# Patient Record
Sex: Female | Born: 1935
Health system: Southern US, Community
[De-identification: ages and names within clinical notes are randomized; demographics above are authoritative.]

## PROBLEM LIST (undated history)

## (undated) DIAGNOSIS — I10 Essential (primary) hypertension: Secondary | ICD-10-CM

## (undated) DIAGNOSIS — E785 Hyperlipidemia, unspecified: Secondary | ICD-10-CM

## (undated) DIAGNOSIS — Z8673 Personal history of transient ischemic attack (TIA), and cerebral infarction without residual deficits: Secondary | ICD-10-CM

## (undated) DIAGNOSIS — J449 Chronic obstructive pulmonary disease, unspecified: Secondary | ICD-10-CM

## (undated) DIAGNOSIS — I4891 Unspecified atrial fibrillation: Secondary | ICD-10-CM

## (undated) DIAGNOSIS — Z9889 Other specified postprocedural states: Secondary | ICD-10-CM

## (undated) HISTORY — DX: Hyperlipidemia, unspecified: E78.5

## (undated) HISTORY — DX: Essential (primary) hypertension: I10

## (undated) HISTORY — DX: Unspecified atrial fibrillation: I48.91

## (undated) HISTORY — DX: Personal history of transient ischemic attack (TIA), and cerebral infarction without residual deficits: Z86.73

## (undated) HISTORY — DX: Other specified postprocedural states: Z98.890

## (undated) HISTORY — DX: Chronic obstructive pulmonary disease, unspecified: J44.9

## (undated) HISTORY — PX: FRACTURE SURGERY: SHX138

## (undated) HISTORY — PX: OTHER SURGICAL HISTORY: SHX169

---

## 1973-06-23 HISTORY — PX: ABDOMINAL HYSTERECTOMY: SHX81

## 2002-05-18 ENCOUNTER — Encounter: Payer: Self-pay | Admitting: Vascular Surgery

## 2002-05-23 ENCOUNTER — Ambulatory Visit (HOSPITAL_COMMUNITY): Admission: RE | Admit: 2002-05-23 | Discharge: 2002-05-23 | Payer: Self-pay | Admitting: Vascular Surgery

## 2002-05-23 HISTORY — PX: OTHER SURGICAL HISTORY: SHX169

## 2004-06-12 ENCOUNTER — Ambulatory Visit: Payer: Self-pay | Admitting: Family Medicine

## 2004-09-18 ENCOUNTER — Ambulatory Visit: Payer: Self-pay | Admitting: Family Medicine

## 2004-11-06 ENCOUNTER — Ambulatory Visit: Payer: Self-pay | Admitting: Family Medicine

## 2005-01-15 ENCOUNTER — Ambulatory Visit: Payer: Self-pay | Admitting: Family Medicine

## 2005-01-21 ENCOUNTER — Ambulatory Visit: Payer: Self-pay | Admitting: Cardiology

## 2005-03-10 ENCOUNTER — Ambulatory Visit: Payer: Self-pay | Admitting: Family Medicine

## 2005-04-21 ENCOUNTER — Ambulatory Visit: Payer: Self-pay | Admitting: Family Medicine

## 2005-06-13 ENCOUNTER — Ambulatory Visit: Payer: Self-pay | Admitting: Cardiology

## 2005-06-18 ENCOUNTER — Encounter: Payer: Self-pay | Admitting: Cardiology

## 2005-06-18 ENCOUNTER — Ambulatory Visit: Payer: Self-pay | Admitting: Cardiology

## 2005-07-10 ENCOUNTER — Ambulatory Visit: Payer: Self-pay | Admitting: Cardiology

## 2005-07-18 ENCOUNTER — Ambulatory Visit: Payer: Self-pay | Admitting: Cardiology

## 2005-07-22 ENCOUNTER — Ambulatory Visit: Payer: Self-pay | Admitting: Family Medicine

## 2005-10-27 ENCOUNTER — Ambulatory Visit: Payer: Self-pay | Admitting: Family Medicine

## 2005-11-11 ENCOUNTER — Encounter: Payer: Self-pay | Admitting: Cardiology

## 2005-11-25 ENCOUNTER — Ambulatory Visit: Payer: Self-pay | Admitting: Cardiology

## 2005-12-02 ENCOUNTER — Inpatient Hospital Stay (HOSPITAL_COMMUNITY): Admission: EM | Admit: 2005-12-02 | Discharge: 2005-12-04 | Payer: Self-pay | Admitting: *Deleted

## 2005-12-03 ENCOUNTER — Encounter (INDEPENDENT_AMBULATORY_CARE_PROVIDER_SITE_OTHER): Payer: Self-pay | Admitting: Neurology

## 2005-12-03 ENCOUNTER — Encounter: Payer: Self-pay | Admitting: Cardiology

## 2005-12-03 ENCOUNTER — Ambulatory Visit: Payer: Self-pay | Admitting: Cardiology

## 2005-12-04 ENCOUNTER — Encounter: Payer: Self-pay | Admitting: Cardiology

## 2006-01-27 ENCOUNTER — Ambulatory Visit: Payer: Self-pay | Admitting: Family Medicine

## 2006-04-24 ENCOUNTER — Ambulatory Visit: Payer: Self-pay | Admitting: Family Medicine

## 2006-06-04 ENCOUNTER — Ambulatory Visit: Payer: Self-pay | Admitting: Family Medicine

## 2006-06-08 ENCOUNTER — Ambulatory Visit: Payer: Self-pay | Admitting: Cardiology

## 2006-06-13 ENCOUNTER — Ambulatory Visit: Payer: Self-pay | Admitting: Cardiology

## 2006-06-30 ENCOUNTER — Ambulatory Visit: Payer: Self-pay | Admitting: Cardiology

## 2006-07-07 ENCOUNTER — Ambulatory Visit: Payer: Self-pay | Admitting: Cardiology

## 2006-07-14 ENCOUNTER — Ambulatory Visit: Payer: Self-pay | Admitting: Cardiology

## 2006-07-21 ENCOUNTER — Ambulatory Visit: Payer: Self-pay | Admitting: Cardiology

## 2006-08-04 ENCOUNTER — Ambulatory Visit: Payer: Self-pay | Admitting: Cardiology

## 2006-08-11 ENCOUNTER — Ambulatory Visit: Payer: Self-pay | Admitting: Cardiology

## 2006-08-18 ENCOUNTER — Ambulatory Visit: Payer: Self-pay | Admitting: Cardiology

## 2006-09-01 ENCOUNTER — Ambulatory Visit: Payer: Self-pay | Admitting: Cardiology

## 2006-09-10 ENCOUNTER — Ambulatory Visit: Payer: Self-pay | Admitting: Family Medicine

## 2006-09-17 ENCOUNTER — Ambulatory Visit: Payer: Self-pay | Admitting: Cardiology

## 2006-10-19 ENCOUNTER — Ambulatory Visit: Payer: Self-pay | Admitting: Cardiology

## 2006-11-17 ENCOUNTER — Ambulatory Visit: Payer: Self-pay | Admitting: Cardiology

## 2006-11-18 ENCOUNTER — Ambulatory Visit: Payer: Self-pay | Admitting: Family Medicine

## 2006-11-25 ENCOUNTER — Ambulatory Visit: Payer: Self-pay | Admitting: Family Medicine

## 2006-12-08 ENCOUNTER — Ambulatory Visit: Payer: Self-pay | Admitting: Cardiology

## 2006-12-22 ENCOUNTER — Ambulatory Visit: Payer: Self-pay | Admitting: Cardiology

## 2007-01-05 ENCOUNTER — Ambulatory Visit: Payer: Self-pay | Admitting: Cardiology

## 2007-01-19 ENCOUNTER — Ambulatory Visit: Payer: Self-pay | Admitting: Cardiology

## 2007-02-01 ENCOUNTER — Inpatient Hospital Stay (HOSPITAL_COMMUNITY): Admission: EM | Admit: 2007-02-01 | Discharge: 2007-02-10 | Payer: Self-pay | Admitting: Emergency Medicine

## 2007-02-08 ENCOUNTER — Ambulatory Visit: Payer: Self-pay | Admitting: Physical Medicine & Rehabilitation

## 2007-04-08 ENCOUNTER — Ambulatory Visit: Payer: Self-pay | Admitting: Cardiology

## 2007-04-30 ENCOUNTER — Ambulatory Visit: Payer: Self-pay | Admitting: Cardiology

## 2007-05-03 ENCOUNTER — Ambulatory Visit: Payer: Self-pay | Admitting: Cardiology

## 2007-05-17 ENCOUNTER — Ambulatory Visit: Payer: Self-pay | Admitting: Cardiology

## 2007-06-14 ENCOUNTER — Ambulatory Visit: Payer: Self-pay | Admitting: Cardiology

## 2007-07-12 ENCOUNTER — Ambulatory Visit: Payer: Self-pay | Admitting: Cardiology

## 2007-07-26 ENCOUNTER — Ambulatory Visit: Payer: Self-pay | Admitting: Cardiology

## 2007-09-02 ENCOUNTER — Ambulatory Visit: Payer: Self-pay | Admitting: Cardiology

## 2007-09-23 ENCOUNTER — Ambulatory Visit: Payer: Self-pay | Admitting: Cardiology

## 2007-10-21 ENCOUNTER — Ambulatory Visit: Payer: Self-pay | Admitting: Cardiology

## 2007-11-18 ENCOUNTER — Ambulatory Visit: Payer: Self-pay | Admitting: Cardiology

## 2007-12-16 ENCOUNTER — Ambulatory Visit: Payer: Self-pay | Admitting: Cardiology

## 2008-01-13 ENCOUNTER — Ambulatory Visit: Payer: Self-pay | Admitting: Cardiology

## 2008-02-15 ENCOUNTER — Ambulatory Visit: Payer: Self-pay | Admitting: Cardiology

## 2008-03-15 ENCOUNTER — Ambulatory Visit: Payer: Self-pay | Admitting: Cardiology

## 2008-03-18 ENCOUNTER — Encounter: Payer: Self-pay | Admitting: Cardiology

## 2008-04-11 ENCOUNTER — Ambulatory Visit: Payer: Self-pay | Admitting: Cardiology

## 2008-04-18 ENCOUNTER — Ambulatory Visit: Payer: Self-pay | Admitting: Cardiology

## 2008-05-01 ENCOUNTER — Ambulatory Visit: Payer: Self-pay | Admitting: Cardiology

## 2008-05-26 ENCOUNTER — Ambulatory Visit: Payer: Self-pay | Admitting: Cardiology

## 2008-06-20 ENCOUNTER — Ambulatory Visit: Payer: Self-pay | Admitting: Cardiology

## 2008-08-01 ENCOUNTER — Ambulatory Visit: Payer: Self-pay | Admitting: Cardiology

## 2008-09-01 ENCOUNTER — Ambulatory Visit: Payer: Self-pay | Admitting: Cardiology

## 2008-09-29 ENCOUNTER — Ambulatory Visit: Payer: Self-pay | Admitting: Cardiology

## 2008-10-24 ENCOUNTER — Ambulatory Visit: Payer: Self-pay | Admitting: Cardiology

## 2008-11-21 ENCOUNTER — Ambulatory Visit: Payer: Self-pay | Admitting: Cardiology

## 2008-12-19 ENCOUNTER — Ambulatory Visit: Payer: Self-pay | Admitting: Cardiology

## 2009-01-16 ENCOUNTER — Ambulatory Visit: Payer: Self-pay | Admitting: Cardiology

## 2009-02-05 ENCOUNTER — Encounter: Payer: Self-pay | Admitting: *Deleted

## 2009-02-13 ENCOUNTER — Ambulatory Visit: Payer: Self-pay | Admitting: Cardiology

## 2009-03-02 ENCOUNTER — Ambulatory Visit: Payer: Self-pay | Admitting: Cardiology

## 2009-03-19 ENCOUNTER — Encounter: Payer: Self-pay | Admitting: Cardiology

## 2009-03-23 ENCOUNTER — Ambulatory Visit: Payer: Self-pay | Admitting: Cardiology

## 2009-03-23 LAB — CONVERTED CEMR LAB: POC INR: 3.2

## 2009-04-17 ENCOUNTER — Ambulatory Visit: Payer: Self-pay | Admitting: Cardiology

## 2009-04-17 LAB — CONVERTED CEMR LAB: POC INR: 2.4

## 2009-05-15 ENCOUNTER — Ambulatory Visit: Payer: Self-pay | Admitting: Cardiology

## 2009-05-15 LAB — CONVERTED CEMR LAB: POC INR: 2.1

## 2009-05-24 ENCOUNTER — Ambulatory Visit: Payer: Self-pay | Admitting: Cardiology

## 2009-06-12 ENCOUNTER — Ambulatory Visit: Payer: Self-pay | Admitting: Cardiology

## 2009-06-12 LAB — CONVERTED CEMR LAB: POC INR: 2.7

## 2009-07-10 ENCOUNTER — Ambulatory Visit: Payer: Self-pay | Admitting: Cardiology

## 2009-07-10 LAB — CONVERTED CEMR LAB: POC INR: 2.5

## 2009-08-07 ENCOUNTER — Ambulatory Visit: Payer: Self-pay | Admitting: Cardiology

## 2009-08-24 ENCOUNTER — Telehealth (INDEPENDENT_AMBULATORY_CARE_PROVIDER_SITE_OTHER): Payer: Self-pay | Admitting: *Deleted

## 2009-09-04 ENCOUNTER — Ambulatory Visit: Payer: Self-pay | Admitting: Cardiology

## 2009-10-02 ENCOUNTER — Ambulatory Visit: Payer: Self-pay | Admitting: Cardiology

## 2009-10-30 ENCOUNTER — Ambulatory Visit: Payer: Self-pay | Admitting: Cardiology

## 2009-11-27 ENCOUNTER — Ambulatory Visit: Payer: Self-pay | Admitting: Cardiology

## 2009-12-25 ENCOUNTER — Ambulatory Visit: Payer: Self-pay | Admitting: Cardiology

## 2010-01-22 ENCOUNTER — Ambulatory Visit: Payer: Self-pay | Admitting: Cardiology

## 2010-01-22 LAB — CONVERTED CEMR LAB: POC INR: 1.9

## 2010-02-19 ENCOUNTER — Ambulatory Visit: Payer: Self-pay | Admitting: Cardiology

## 2010-02-19 LAB — CONVERTED CEMR LAB: POC INR: 1.9

## 2010-03-12 ENCOUNTER — Ambulatory Visit: Payer: Self-pay | Admitting: Cardiology

## 2010-03-12 LAB — CONVERTED CEMR LAB: POC INR: 2.4

## 2010-04-09 ENCOUNTER — Ambulatory Visit: Payer: Self-pay | Admitting: Cardiology

## 2010-04-09 LAB — CONVERTED CEMR LAB: POC INR: 2.2

## 2010-05-07 ENCOUNTER — Ambulatory Visit: Payer: Self-pay | Admitting: Cardiology

## 2010-05-07 LAB — CONVERTED CEMR LAB: POC INR: 2.2

## 2010-05-27 ENCOUNTER — Ambulatory Visit: Payer: Self-pay | Admitting: Cardiology

## 2010-05-30 ENCOUNTER — Encounter: Payer: Self-pay | Admitting: Cardiology

## 2010-06-04 ENCOUNTER — Ambulatory Visit: Payer: Self-pay | Admitting: Cardiology

## 2010-06-07 ENCOUNTER — Encounter (INDEPENDENT_AMBULATORY_CARE_PROVIDER_SITE_OTHER): Payer: Self-pay | Admitting: *Deleted

## 2010-07-02 ENCOUNTER — Ambulatory Visit: Admission: RE | Admit: 2010-07-02 | Discharge: 2010-07-02 | Payer: Self-pay | Source: Home / Self Care

## 2010-07-23 NOTE — Medication Information (Signed)
Summary: ccr-lr  Anticoagulant Therapy  Managed by: Vashti Hey, RN Referring MD: Jerral Bonito PCP: Massie Maroon MD: Andee Lineman MD, Michelle Piper Indication 1: Atrial Fibrillation (ICD-427.31) [facility] [place] Site: [place] INR POC 2.0  Dietary changes: no    Health status changes: no    Bleeding/hemorrhagic complications: no    Recent/future hospitalizations: no    Any changes in medication regimen? no    Recent/future dental: no  Any missed doses?: yes     Details: missed 1/2 dose 1 week ago  Is patient compliant with meds? yes       Allergies: 1)  ! Codeine  Anticoagulation Management History:      The patient is taking warfarin and comes in today for a routine follow up visit.  Positive risk factors for bleeding include an age of 9 years or older and history of CVA/TIA.  The bleeding index is 'intermediate risk'.  Positive CHADS2 values include History of HTN and Prior Stroke/CVA/TIA.  Negative CHADS2 values include Age > 32 years old.  The start date was 06/23/2006.  Anticoagulation responsible provider: Andee Lineman MD, Michelle Piper.  INR POC: 2.0.  Cuvette Lot#: [id number].  Exp: 10/11.    Anticoagulation Management Assessment/Plan:      [name] current anticoagulation dose is Warfarin sodium 5 mg tabs: Take 1 tablet by mouth once a day, Warfarin sodium 5 mg tabs: take as directed per coumadin clinic.  The target INR is 2 - 3.  The next INR is due 11/27/2009.  Anticoagulation instructions were given to patient.  Results were reviewed/authorized by Vashti Hey, RN.  She was notified by Vashti Hey RN.         Prior Anticoagulation Instructions: INR 2.2 Continue coumadin 2.5mg  once daily except 5mg  on Sundays and Thursdays  Current Anticoagulation Instructions: INR 2.0 Take coumadin 5mg  tonight then resume 2.5mg  once daily except 5mg  on Sundays and Thursdays

## 2010-07-23 NOTE — Medication Information (Signed)
Summary: ccr-lr  Anticoagulant Therapy  Managed by: Vashti Hey, RN Referring MD: Jerral Bonito PCP: Massie Maroon MD: Andee Lineman MD, Michelle Piper Indication 1: Atrial Fibrillation (ICD-427.31) [facility] [place] Site: [place] INR POC 2.2  Dietary changes: no    Health status changes: no    Bleeding/hemorrhagic complications: no    Recent/future hospitalizations: no    Any changes in medication regimen? no    Recent/future dental: no  Any missed doses?: no       Is patient compliant with meds? yes       Allergies: 1)  ! Codeine  Anticoagulation Management History:      The patient is taking warfarin and comes in today for a routine follow up visit.  Positive risk factors for bleeding include an age of 73 years or older and history of CVA/TIA.  The bleeding index is 'intermediate risk'.  Positive CHADS2 values include History of HTN and Prior Stroke/CVA/TIA.  Negative CHADS2 values include Age > 35 years old.  The start date was 06/23/2006.  Anticoagulation responsible provider: Andee Lineman MD, Michelle Piper.  INR POC: 2.2.  Cuvette Lot#: [id number].  Exp: 10/11.    Anticoagulation Management Assessment/Plan:      [name] current anticoagulation dose is Warfarin sodium 5 mg tabs: Take 1 tablet by mouth once a day, Warfarin sodium 5 mg tabs: take as directed per coumadin clinic.  The target INR is 2 - 3.  The next INR is due 10/30/2009.  Anticoagulation instructions were given to patient.  Results were reviewed/authorized by Vashti Hey, RN.  She was notified by Vashti Hey RN.         Prior Anticoagulation Instructions: INR 2.3 Continue coumadin 2.5mg  once daily except 5mg  on Sundays and Thursdays  Current Anticoagulation Instructions: INR 2.2 Continue coumadin 2.5mg  once daily except 5mg  on Sundays and Thursdays

## 2010-07-23 NOTE — Medication Information (Signed)
Summary: ccr-lr  Anticoagulant Therapy  Managed by: Vashti Hey, RN Referring MD: Jerral Bonito PCP: Massie Maroon MD: Andee Lineman MD, Michelle Piper Indication 1: Atrial Fibrillation (ICD-427.31) [facility] [place] Site: [place] INR POC 2.2  Dietary changes: no    Health status changes: no    Bleeding/hemorrhagic complications: no    Recent/future hospitalizations: no    Any changes in medication regimen? no    Recent/future dental: no  Any missed doses?: no       Is patient compliant with meds? yes       Allergies: 1)  ! Codeine  Anticoagulation Management History:      The patient is taking warfarin and comes in today for a routine follow up visit.  Positive risk factors for bleeding include an age of 39 years or older and history of CVA/TIA.  The bleeding index is 'intermediate risk'.  Positive CHADS2 values include History of HTN and Prior Stroke/CVA/TIA.  Negative CHADS2 values include Age > 62 years old.  The start date was 06/23/2006.  Anticoagulation responsible provider: Andee Lineman MD, Michelle Piper.  INR POC: 2.2.  Cuvette Lot#: [id number].  Exp: 10/11.    Anticoagulation Management Assessment/Plan:      [name] current anticoagulation dose is Warfarin sodium 5 mg tabs: Take 1 tablet by mouth once a day, Warfarin sodium 5 mg tabs: take as directed per coumadin clinic.  The target INR is 2 - 3.  The next INR is due 05/07/2010.  Anticoagulation instructions were given to patient.  Results were reviewed/authorized by Vashti Hey, RN.  She was notified by Vashti Hey RN.         Prior Anticoagulation Instructions: INR 2.4 Continue coumadin 2.5mg  once daily except 5mg  on S,T,Th  Current Anticoagulation Instructions: INR 2.2 Continue coumadin 2.5mg  once daily except 5mg  on S,T,Th

## 2010-07-23 NOTE — Progress Notes (Signed)
Summary: dental extraction  Phone Note Call from Patient   Caller: Patient Reason for Call: Talk to Nurse Summary of Call: Pt states dentist needs to pull 1 tooth on 08/27/09.  He told her stop coumadin 3 days before.  She will resume coumadin after procedure on 08/27/09 .  She has F/U INR check on 09/04/09. Initial call taken by: Vashti Hey RN,  August 24, 2009 4:27 PM

## 2010-07-23 NOTE — Medication Information (Signed)
Summary: ccr-lr  Anticoagulant Therapy  Managed by: Vashti Hey, RN Referring MD: Jerral Bonito PCP: Massie Maroon MD: Antoine Poche MD, Fayrene Fearing Indication 1: Atrial Fibrillation (ICD-427.31) Lab [facility] [place] Site: [place] INR POC 2.1  Dietary changes: no    Health status changes: no    Bleeding/hemorrhagic complications: no    Recent/future hospitalizations: no    Any changes in medication regimen? no    Recent/future dental: no  Any missed doses?: no       Is patient compliant with meds? yes       Allergies: 1)  ! Codeine  Anticoagulation Management History:      The patient is taking warfarin and comes in today for a routine follow up visit.  Positive risk factors for bleeding include an age of 73 years or older and history of CVA/TIA.  The bleeding index is 'intermediate risk'.  Positive CHADS2 values include History of HTN and Prior Stroke/CVA/TIA.  Negative CHADS2 values include Age > 71 years old.  The start date was 06/23/2006.  Anticoagulation responsible provider: Antoine Poche MD, Fayrene Fearing.  INR POC: 2.1.  Cuvette Lot#: [id number].  Exp: 10/11.    Anticoagulation Management Assessment/Plan:      [name] current anticoagulation dose is Warfarin sodium 5 mg tabs: Take 1 tablet by mouth once a day, Warfarin sodium 5 mg tabs: take as directed per coumadin clinic.  The target INR is 2 - 3.  The next INR is due 09/04/2009.  Anticoagulation instructions were given to patient.  Results were reviewed/authorized by Vashti Hey, RN.  She was notified by Vashti Hey RN.         Prior Anticoagulation Instructions: INR 2.5 Continue coumadin 2.5mg  once daily except 5mg  on Sundays and Thursdays  Current Anticoagulation Instructions: INR 2.1 Continue coumadin 2.5mg  once daily except 5mg  on Sundays and Thursdays

## 2010-07-23 NOTE — Medication Information (Signed)
Summary: ccr-lr  Anticoagulant Therapy  Managed by: Vashti Hey, RN Referring MD: Jerral Bonito PCP: Massie Maroon MD: Andee Lineman MD, Michelle Piper Indication 1: Atrial Fibrillation (ICD-427.31) Lab [facility] [place] Site: [place] INR POC 2.3  Dietary changes: no    Health status changes: no    Bleeding/hemorrhagic complications: no    Recent/future hospitalizations: no    Any changes in medication regimen? no    Recent/future dental: yes     Details: had dental extraction 08/27/09  Any missed doses?: yes     Details: was off coumadin 3 days prior to procedure  Is patient compliant with meds? yes       Allergies: 1)  ! Codeine  Anticoagulation Management History:      The patient is taking warfarin and comes in today for a routine follow up visit.  Positive risk factors for bleeding include an age of 6 years or older and history of CVA/TIA.  The bleeding index is 'intermediate risk'.  Positive CHADS2 values include History of HTN and Prior Stroke/CVA/TIA.  Negative CHADS2 values include Age > 49 years old.  The start date was 06/23/2006.  Anticoagulation responsible provider: Andee Lineman MD, Michelle Piper.  INR POC: 2.3.  Cuvette Lot#: [id number].  Exp: 10/11.    Anticoagulation Management Assessment/Plan:      [name] current anticoagulation dose is Warfarin sodium 5 mg tabs: Take 1 tablet by mouth once a day, Warfarin sodium 5 mg tabs: take as directed per coumadin clinic.  The target INR is 2 - 3.  The next INR is due 10/02/2009.  Anticoagulation instructions were given to patient.  Results were reviewed/authorized by Vashti Hey, RN.  She was notified by Vashti Hey RN.         Prior Anticoagulation Instructions: INR 2.1 Continue coumadin 2.5mg  once daily except 5mg  on Sundays and Thursdays  Current Anticoagulation Instructions: INR 2.3 Continue coumadin 2.5mg  once daily except 5mg  on Sundays and Thursdays

## 2010-07-23 NOTE — Medication Information (Signed)
Summary: ccr-lr  Anticoagulant Therapy  Managed by: Vashti Hey, RN Referring MD: Jerral Bonito PCP: Massie Maroon MD: Diona Browner MD, Remi Deter Indication 1: Atrial Fibrillation (ICD-427.31) Lab [facility] [place] Site: [place] INR POC 2.2  Dietary changes: no    Health status changes: no    Bleeding/hemorrhagic complications: no    Recent/future hospitalizations: no    Any changes in medication regimen? no    Recent/future dental: no  Any missed doses?: no       Is patient compliant with meds? yes       Allergies: 1)  ! Codeine  Anticoagulation Management History:      The patient is taking warfarin and comes in today for a routine follow up visit.  Positive risk factors for bleeding include an age of 40 years or older and history of CVA/TIA.  The bleeding index is 'intermediate risk'.  Positive CHADS2 values include History of HTN and Prior Stroke/CVA/TIA.  Negative CHADS2 values include Age > 59 years old.  The start date was 06/23/2006.  Anticoagulation responsible provider: Diona Browner MD, Remi Deter.  INR POC: 2.2.  Cuvette Lot#: [id number].  Exp: 10/11.    Anticoagulation Management Assessment/Plan:      [name] current anticoagulation dose is Warfarin sodium 5 mg tabs: Take 1 tablet by mouth once a day, Warfarin sodium 5 mg tabs: take as directed per coumadin clinic.  The target INR is 2 - 3.  The next INR is due 06/04/2010.  Anticoagulation instructions were given to patient.  Results were reviewed/authorized by Vashti Hey, RN.  She was notified by Vashti Hey RN.         Prior Anticoagulation Instructions: INR 2.2 Continue coumadin 2.5mg  once daily except 5mg  on S,T,Th  Current Anticoagulation Instructions: Same as Prior Instructions.

## 2010-07-23 NOTE — Medication Information (Signed)
Summary: ccr-lr  Anticoagulant Therapy  Managed by: Vashti Hey, RN Referring MD: Jerral Bonito PCP: Massie Maroon MD: Andee Lineman MD, Michelle Piper Indication 1: Atrial Fibrillation (ICD-427.31) Lab [facility] [place] Site: [place] INR POC 1.9  Dietary changes: no    Health status changes: no    Bleeding/hemorrhagic complications: no    Recent/future hospitalizations: no    Any changes in medication regimen? no    Recent/future dental: no  Any missed doses?: no       Is patient compliant with meds? yes       Allergies: 1)  ! Codeine  Anticoagulation Management History:      The patient is taking warfarin and comes in today for a routine follow up visit.  Positive risk factors for bleeding include an age of 73 years or older and history of CVA/TIA.  The bleeding index is 'intermediate risk'.  Positive CHADS2 values include History of HTN and Prior Stroke/CVA/TIA.  Negative CHADS2 values include Age > 73 years old.  The start date was 06/23/2006.  Anticoagulation responsible provider: Andee Lineman MD, Michelle Piper.  INR POC: 1.9.  Cuvette Lot#: [id number].  Exp: 10/11.    Anticoagulation Management Assessment/Plan:      [name] current anticoagulation dose is Warfarin sodium 5 mg tabs: Take 1 tablet by mouth once a day, Warfarin sodium 5 mg tabs: take as directed per coumadin clinic.  The target INR is 2 - 3.  The next INR is due 03/12/2010.  Anticoagulation instructions were given to patient.  Results were reviewed/authorized by Vashti Hey, RN.  She was notified by Vashti Hey RN.         Prior Anticoagulation Instructions: INR 1.9 Take coumadin 5mg  tonight then resume 2.5mg  once daily except 5mg  on Sundays and Thursdays  Current Anticoagulation Instructions: INR 1.9 Increase coumadin 2.5mg  once daily except 5mg  on S, T, Th

## 2010-07-23 NOTE — Medication Information (Signed)
Summary: ccr-lr  Anticoagulant Therapy  Managed by: Vashti Hey, RN Referring MD: Jerral Bonito PCP: Massie Maroon MD: Andee Lineman MD, Michelle Piper Indication 1: Atrial Fibrillation (ICD-427.31) Lab [facility] [place] Site: [place] INR POC 2.2  Dietary changes: no    Health status changes: no    Bleeding/hemorrhagic complications: no    Recent/future hospitalizations: no    Any changes in medication regimen? no    Recent/future dental: no  Any missed doses?: no       Is patient compliant with meds? yes       Allergies: 1)  ! Codeine  Anticoagulation Management History:      The patient is taking warfarin and comes in today for a routine follow up visit.  Positive risk factors for bleeding include an age of 73 years or older and history of CVA/TIA.  The bleeding index is 'intermediate risk'.  Positive CHADS2 values include History of HTN and Prior Stroke/CVA/TIA.  Negative CHADS2 values include Age > 79 years old.  The start date was 06/23/2006.  Anticoagulation responsible provider: Andee Lineman MD, Michelle Piper.  INR POC: 2.2.  Cuvette Lot#: [id number].  Exp: 10/11.    Anticoagulation Management Assessment/Plan:      [name] current anticoagulation dose is Warfarin sodium 5 mg tabs: Take 1 tablet by mouth once a day, Warfarin sodium 5 mg tabs: take as directed per coumadin clinic.  The target INR is 2 - 3.  The next INR is due 12/25/2009.  Anticoagulation instructions were given to patient.  Results were reviewed/authorized by Vashti Hey, RN.  She was notified by Vashti Hey RN.         Prior Anticoagulation Instructions: INR 2.0 Take coumadin 5mg  tonight then resume 2.5mg  once daily except 5mg  on Sundays and Thursdays  Current Anticoagulation Instructions: INR 2.2 Continue coumadin 2.5mg  once daily except 5mg  on Sundays and Thursdays

## 2010-07-23 NOTE — Medication Information (Signed)
Summary: ccr-lr  Anticoagulant Therapy  Managed by: Wanda Hey, RN Referring MD: Jerral Bonito PCP: Massie Maroon MD: Antoine Poche MD, Fayrene Fearing Indication 1: Atrial Fibrillation (ICD-427.31) Lab [facility] [place] Site: [place] INR POC 2.4  Dietary changes: no    Health status changes: no    Bleeding/hemorrhagic complications: no    Recent/future hospitalizations: no    Any changes in medication regimen? no    Recent/future dental: no  Any missed doses?: no       Is patient compliant with meds? yes       Allergies: 1)  ! Codeine  Anticoagulation Management History:      The patient is taking warfarin and comes in today for a routine follow up visit.  Positive risk factors for bleeding include an age of 75 years or older and history of CVA/TIA.  The bleeding index is 'intermediate risk'.  Positive CHADS2 values include History of HTN and Prior Stroke/CVA/TIA.  Negative CHADS2 values include Age > 18 years old.  The start date was 06/23/2006.  Anticoagulation responsible provider: Antoine Poche MD, Fayrene Fearing.  INR POC: 2.4.  Cuvette Lot#: [id number].  Exp: 10/11.    Anticoagulation Management Assessment/Plan:      [name] current anticoagulation dose is Warfarin sodium 5 mg tabs: Take 1 tablet by mouth once a day, Warfarin sodium 5 mg tabs: take as directed per coumadin clinic.  The target INR is 2 - 3.  The next INR is due 04/09/2010.  Anticoagulation instructions were given to patient.  Results were reviewed/authorized by Wanda Hey, RN.  She was notified by Wanda Hey RN.         Prior Anticoagulation Instructions: INR 1.9 Increase coumadin 2.5mg  once daily except 5mg  on S, T, Th  Current Anticoagulation Instructions: INR 2.4 Continue coumadin 2.5mg  once daily except 5mg  on S,T,Th

## 2010-07-23 NOTE — Assessment & Plan Note (Signed)
Summary: 1 YR FU PER DEC REMINDER-SRS   Visit Type:  Follow-up Primary Provider:  Darcel Bayley Nyland,MD  CC:  atrial fibrillation.  History of Present Illness: The patient is seen for followup of atrial fibrillation.  I saw her last December, 2010.  She's been stable.  She does not have any significant palpitations.  She has not had syncope or presyncope.  She has an old CVA that was felt probably related to her atrial fibrillation.  She is on Coumadin.  Unfortunately she lost one sister from cancer and has another one with ongoing cancer problem.  Preventive Screening-Counseling & Management  Alcohol-Tobacco     Smoking Status: never  Current Medications (verified): 1)  Atenolol 25 Mg Tabs (Atenolol) .... Take 1 Tablet By Mouth Once A Day 2)  Lipitor 80 Mg Tabs (Atorvastatin Calcium) .... Take 1 Tablet By Mouth Once A Day 3)  Fenofibrate 160 Mg Tabs (Fenofibrate) .... Take 1 Tablet By Mouth Once A Day 4)  Isosorbide Mononitrate Cr 30 Mg Xr24h-Tab (Isosorbide Mononitrate) .... Take 1 Tablet By Mouth Once A Day 5)  Hydrochlorothiazide 25 Mg Tabs (Hydrochlorothiazide) .... 1/2 Tab Daily 6)  Folbee 2.5-25-1 Mg Tabs (Folic Acid-Vit B6-Vit B12) .... Take 1 Tablet By Mouth Once A Day 7)  Diltiazem Hcl 120 Mg Tabs (Diltiazem Hcl) .... Take 1 Tablet By Mouth Two Times A Day 8)  Calcium 600 1500 Mg Tabs (Calcium Carbonate) .... Take 1 Tablet By Mouth Two Times A Day 9)  Alendronate Sodium 70 Mg Tabs (Alendronate Sodium) .... One Tab Once Weekly 10)  Fish Oil 1000 Mg Caps (Omega-3 Fatty Acids) .... 2 Tabs Two Times A Day 11)  Daily Multiple Vitamins  Tabs (Multiple Vitamin) .... Take 1 Tablet By Mouth Once A Day 12)  Warfarin Sodium 5 Mg Tabs (Warfarin Sodium) .... Take As Directed Per [facility]madin Clinic 13)  Vitamin B-12 250 Mcg Tabs (Cyanocobalamin) .... Take 1 Tablet By Mouth Once A Day  Allergies (verified): 1)  ! Codeine  Comments:  Nurse/Medical Assistant: [name] medication list  and allergies were reviewed with the patient and were updated in the Medication and Allergy Lists.  Past History:  Past Medical History: ATRIAL FIBRILLATION (ICD-427.31)...coumadin HYPERTENSION, UNSPECIFIED (ICD-401.9) HYPERLIPIDEMIA-MIXED (ICD-272.4) Coumadin Rx CVA.Marland Kitchen  old Right middle cerebral artery infarct ..probably from atrial fib....no PFO by TEE then EF  55-60%  ..echo..11/2005.Marland Kitchen Aortic Insufficiency...mild...echo...2007 [place]...niaspan Hypotension....unexplained episode in the past...pheo ruled out. COPD...   by Yvone Neu...05/2005...borderline abnormalities Carotid doppler....01/2005...no abnormality..  Review of Systems       Patient denies fever, chills, headache, sweats, rash, change in vision, change in hearing, chest pain, cough, nausea vomiting, urinary symptoms.  All of the systems are reviewed and are negative.  Vital Signs:  Patient profile:   75 year old female Height:      70 inches Weight:      201 pounds BMI:     28.94 Pulse rate:   52 / minute BP sitting:   172 / 89  (left arm) Cuff size:   regular  Vitals Entered By: Carlye Grippe (May 27, 2010 10:05 AM)  Nutrition Counseling: Patient's BMI is greater than 25 and therefore counseled on weight management options.  Physical Exam  General:  patient is stable in general. Head:  head is atraumatic. Eyes:  no xanthelasma. Neck:  no jugulovenous distention. Chest Wall:  no chest wall tenderness. Lungs:  lungs are clear.  Respiratory effort is nonlabored. Heart:  cardiac exam reveals S1-S2.  No clicks or significant murmurs. Abdomen:  abdomen is soft. Msk:  no musculoskeletal deformities. Extremities:  no peripheral edema. Skin:  no skin rashes. Psych:  patient is oriented to person time and place.  Affect is normal.   Impression & Recommendations:  Problem # 1:  COPD (ICD-496) The patient does have some shortness of breath with exertion.  No further workup.  Problem # 2:  AORTIC  INSUFFICIENCY (ICD-424.1)  Her updated medication list for this problem includes:    Atenolol 25 Mg Tabs (Atenolol) .Marland Kitchen... Take 1 tablet by mouth once a day    Isosorbide Mononitrate Cr 30 Mg Xr24h-tab (Isosorbide mononitrate) .Marland Kitchen... Take 1 tablet by mouth once a day    Hydrochlorothiazide 25 Mg Tabs (Hydrochlorothiazide) .Marland Kitchen... 1/2 tab daily Aortic insufficiency was mild by echo in the past. Because she has shortness of breath and followup valvular heart disease, we will arrange for a followup two-dimensional echo.  He'll be in touch with her with the information.  Orders: 2-D Echocardiogram (2D Echo)  Problem # 3:  CVA (ICD-434.91)  The following medications were removed from the medication list:    Warfarin Sodium 5 Mg Tabs (Warfarin sodium) .Marland Kitchen... Take 1 tablet by mouth once a day Her updated medication list for this problem includes:    Warfarin Sodium 5 Mg Tabs (Warfarin sodium) .Marland Kitchen... Take as directed per coumadin clinic The patient had a CVA in the past.  She is to continue on Coumadin.  Problem # 4:  COUMADIN THERAPY (ICD-V58.61) , and therapy is to continue.  No change in therapy.  Problem # 5:  ATRIAL FIBRILLATION (ICD-427.31)  The following medications were removed from the medication list:    Warfarin Sodium 5 Mg Tabs (Warfarin sodium) .Marland Kitchen... Take 1 tablet by mouth once a day Her updated medication list for this problem includes:    Atenolol 25 Mg Tabs (Atenolol) .Marland Kitchen... Take 1 tablet by mouth once a day    Warfarin Sodium 5 Mg Tabs (Warfarin sodium) .Marland Kitchen... Take as directed per coumadin clinic  Orders: EKG w/ Interpretation (93000) 2-D Echocardiogram (2D Echo) EKG is done today and reviewed by me.  There is no significant change in the QRS.  She has old atrial fibrillation.  The rate is relatively slow.  I have considered carefully whether the dosing of her medications should be changed.  Her systolic blood pressure is elevated.  It had not been elevated when she saw her primary  physician recently.  I've encouraged her to see her primary physician again in a few weeks when she has her lipids rechecked.  If she continues hypertensive at that time, I would consider switching diltiazem to amlodipine to see if her heart rate increases mildly and if she gets better blood pressure control.  If there is any questions about this I be happy to see her in followup.  Problem # 6:  HYPERLIPIDEMIA-MIXED (ICD-272.4)  Her updated medication list for this problem includes:    Lipitor 80 Mg Tabs (Atorvastatin calcium) .Marland Kitchen... Take 1 tablet by mouth once a day    Fenofibrate 160 Mg Tabs (Fenofibrate) .Marland Kitchen... Take 1 tablet by mouth once a day Recently her simvastatin was changed to Lipitor appropriately because of new FDA recommendations.  I'll see her back in one year for cardiology follow.  Patient Instructions: 1)  Your physician wants you to follow-up in: 1 year. You will receive a reminder letter in the mail one-two months in advance. If you don't receive a letter, please  call our office to schedule the follow-up appointment. 2)  SEE DR. NYLAND, PRIMARY MD, ON TUESDAY, June 18, 2010 AT 10:30AM TO FOLLOW UP ON BLOOD PRESSURE. 3)  Your physician recommends that you continue on your current medications as directed. Please refer to the Current Medication list given to you today. 4)  Your physician has requested that you have an echocardiogram.  Echocardiography is a painless test that uses sound waves to create images of your heart. It provides your doctor with information about the size and shape of your heart and how well your heart's chambers and valves are working.  This procedure takes approximately one hour. There are no restrictions for this procedure. If the results of your test are normal or stable, you will receive a letter. If they are abnormal, the nurse will contact you by phone.

## 2010-07-23 NOTE — Medication Information (Signed)
Summary: ccr-lr  Anticoagulant Therapy  Managed by: Vashti Hey, RN Referring MD: Jerral Bonito PCP: Massie Maroon MD: Andee Lineman MD, Michelle Piper Indication 1: Atrial Fibrillation (ICD-427.31) [facility] Site: [place] INR POC 2.5  Dietary changes: no    Health status changes: no    Bleeding/hemorrhagic complications: no    Recent/future hospitalizations: no    Any changes in medication regimen? no    Recent/future dental: no  Any missed doses?: no       Is patient compliant with meds? yes       Allergies: 1)  ! Codeine  Anticoagulation Management History:      The patient is taking warfarin and comes in today for a routine follow up visit.  Positive risk factors for bleeding include an age of 36 years or older and history of CVA/TIA.  The bleeding index is 'intermediate risk'.  Positive CHADS2 values include History of HTN and Prior Stroke/CVA/TIA.  Negative CHADS2 values include Age > 34 years old.  The start date was 06/23/2006.  Anticoagulation responsible provider: Andee Lineman MD, Michelle Piper.  INR POC: 2.5.  Cuvette Lot#: [id number].  Exp: 10/11.    Anticoagulation Management Assessment/Plan:      [name] current anticoagulation dose is Warfarin sodium 5 mg tabs: Take 1 tablet by mouth once a day, Warfarin sodium 5 mg tabs: take as directed per coumadin clinic.  The target INR is 2 - 3.  The next INR is due 08/07/2009.  Anticoagulation instructions were given to patient.  Results were reviewed/authorized by Vashti Hey, RN.  She was notified by Vashti Hey RN.         Prior Anticoagulation Instructions: INR 2.7 Continue coumadin 2.5mg  once daily except 5mg  on Sundays and Thursdays  Current Anticoagulation Instructions: INR 2.5 Continue coumadin 2.5mg  once daily except 5mg  on Sundays and Thursdays

## 2010-07-23 NOTE — Medication Information (Signed)
Summary: ccr-lr  Anticoagulant Therapy  Managed by: Wanda Hey, Wanda Mckenzie Referring MD: Jerral Bonito PCP: Massie Maroon MD: Antoine Poche MD, Fayrene Fearing Indication 1: Atrial Fibrillation (ICD-427.31) Lab [facility] Site: [place] INR POC 2.2  Dietary changes: no    Health status changes: no    Bleeding/hemorrhagic complications: no    Recent/future hospitalizations: no    Any changes in medication regimen? no    Recent/future dental: no  Any missed doses?: no       Is patient compliant with meds? yes       Allergies: 1)  ! Codeine  Anticoagulation Management History:      The patient is taking warfarin and comes in today for a routine follow up visit.  Positive risk factors for bleeding include an age of 73 years or older and history of CVA/TIA.  The bleeding index is 'intermediate risk'.  Positive CHADS2 values include History of HTN and Prior Stroke/CVA/TIA.  Negative CHADS2 values include Age > 67 years old.  The start date was 06/23/2006.  Anticoagulation responsible provider: Antoine Poche MD, Fayrene Fearing.  INR POC: 2.2.  Cuvette Lot#: [id number].  Exp: 10/11.    Anticoagulation Management Assessment/Plan:      [name] current anticoagulation dose is Warfarin sodium 5 mg tabs: Take 1 tablet by mouth once a day, Warfarin sodium 5 mg tabs: take as directed per coumadin clinic.  The target INR is 2 - 3.  The next INR is due 01/22/2010.  Anticoagulation instructions were given to patient.  Results were reviewed/authorized by Wanda Hey, Wanda Mckenzie.  She was notified by Wanda Hey Wanda Mckenzie.         Prior Anticoagulation Instructions: INR 2.2 Continue coumadin 2.5mg  once daily except 5mg  on Sundays and Thursdays  Current Anticoagulation Instructions: Same as Prior Instructions.

## 2010-07-23 NOTE — Medication Information (Signed)
Summary: ccr-lr  Anticoagulant Therapy  Managed by: Vashti Hey, RN Referring MD: Jerral Bonito PCP: Massie Maroon MD: Andee Lineman MD, Michelle Piper Indication 1: Atrial Fibrillation (ICD-427.31) Lab [facility]: [facility] [place] Site: [facility] INR POC 1.9  Dietary changes: no    Health status changes: no    Bleeding/hemorrhagic complications: no    Recent/future hospitalizations: no    Any changes in medication regimen? no    Recent/future dental: no  Any missed doses?: no       Is patient compliant with meds? yes       Allergies: 1)  ! Codeine  Anticoagulation Management History:      The patient is taking warfarin and comes in today for a routine follow up visit.  Positive risk factors for bleeding include an age of 73 years or older and history of CVA/TIA.  The bleeding index is 'intermediate risk'.  Positive CHADS2 values include History of HTN and Prior Stroke/CVA/TIA.  Negative CHADS2 values include Age > 73 years old.  The start date was 06/23/2006.  Anticoagulation responsible provider: Andee Lineman MD, Michelle Piper.  INR POC: 1.9.  Cuvette Lot#: [id number].  Exp: 10/11.    Anticoagulation Management Assessment/Plan:      [name] current anticoagulation dose is Warfarin sodium 5 mg tabs: Take 1 tablet by mouth once a day, Warfarin sodium 5 mg tabs: take as directed per coumadin clinic.  The target INR is 2 - 3.  The next INR is due 02/19/2010.  Anticoagulation instructions were given to patient.  Results were reviewed/authorized by Vashti Hey, RN.  She was notified by Vashti Hey RN.         Prior Anticoagulation Instructions: INR 2.2 Continue coumadin 2.5mg  once daily except 5mg  on Sundays and Thursdays  Current Anticoagulation Instructions: INR 1.9 Take coumadin 5mg  tonight then resume 2.5mg  once daily except 5mg  on Sundays and Thursdays

## 2010-07-25 NOTE — Letter (Signed)
Summary: Engineer, materials at [facility]  [address]   [place], [place] [address]   Phone: [phone]  Fax: [phone]        June 07, 2010 MRN: [id number]    Wanda Mckenzie [address] [place], [place]  [address]    Dear Ms. Mickiewicz,  Your test ordered by Selena Batten has been reviewed by your physician (or physician assistant) and was found to be normal or stable. Your physician (or physician assistant) felt no changes were needed at this time.  __X__ Echocardiogram  ____ Cardiac Stress Test  ____ Lab Work  ____ Peripheral vascular study of arms, legs or neck  ____ CT scan or X-ray  ____ Lung or Breathing test  ____ Other:   Thank you.   Cyril Loosen, RN, BSN    Duane Boston, M.D., F.A.C.C. Thressa Sheller, M.D., F.A.C.C. Oneal Grout, M.D., F.A.C.C. Cheree Ditto, M.D., F.A.C.C. Daiva Nakayama, M.D., F.A.C.C. Kenney Houseman, M.D., F.A.C.C. Jeanne Ivan, PA-C

## 2010-07-25 NOTE — Medication Information (Signed)
Summary: ccr-lr  Anticoagulant Therapy  Managed by: Vashti Hey, RN Referring MD: Jerral Bonito PCP: Fanny Skates Supervising MD: Andee Lineman MD, Michelle Piper Indication 1: Atrial Fibrillation (ICD-427.31) Lab [facility] Site: [place] INR POC 2.4  Dietary changes: no    Health status changes: no    Bleeding/hemorrhagic complications: no    Recent/future hospitalizations: no    Any changes in medication regimen? no    Recent/future dental: no  Any missed doses?: no       Is patient compliant with meds? yes       Allergies: 1)  ! Codeine  Anticoagulation Management History:      The patient is taking warfarin and comes in today for a routine follow up visit.  Positive risk factors for bleeding include an age of 16 years or older and history of CVA/TIA.  The bleeding index is 'intermediate risk'.  Positive CHADS2 values include History of HTN and Prior Stroke/CVA/TIA.  Negative CHADS2 values include Age > 74 years old.  The start date was 06/23/2006.  Anticoagulation responsible Marquez Ceesay: Andee Lineman MD, Michelle Piper.  INR POC: 2.4.  Cuvette Lot#: [id number].  Exp: 10/11.    Anticoagulation Management Assessment/Plan:      [name] current anticoagulation dose is Warfarin sodium 5 mg tabs: take as directed per coumadin clinic.  The target INR is 2 - 3.  The next INR is due 07/02/2010.  Anticoagulation instructions were given to patient.  Results were reviewed/authorized by Vashti Hey, RN.  She was notified by Vashti Hey RN.         Prior Anticoagulation Instructions: INR 2.2 Continue coumadin 2.5mg  once daily except 5mg  on S,T,Th  Current Anticoagulation Instructions: INR 2.4 Continue coumadin 2.5mg  once daily except 5mg  on S,T,Th

## 2010-07-25 NOTE — Medication Information (Signed)
Summary: ccr-lr  Anticoagulant Therapy  Managed by: Vashti Hey, RN Referring MD: Jerral Bonito PCP: Fanny Skates Supervising MD: Andee Lineman MD, Michelle Piper Indication 1: Atrial Fibrillation (ICD-427.31) Lab [facility] Site: [place] INR POC 3.4  Dietary changes: yes       Details: had less Vit K foods this month  Health status changes: no    Bleeding/hemorrhagic complications: no    Recent/future hospitalizations: no    Any changes in medication regimen? no    Recent/future dental: no  Any missed doses?: no       Is patient compliant with meds? yes       Allergies: 1)  ! Codeine  Anticoagulation Management History:      The patient is taking warfarin and comes in today for a routine follow up visit.  Positive risk factors for bleeding include an age of 21 years or older and history of CVA/TIA.  The bleeding index is 'intermediate risk'.  Positive CHADS2 values include History of HTN and Prior Stroke/CVA/TIA.  Negative CHADS2 values include Age > 66 years old.  The start date was 06/23/2006.  Anticoagulation responsible provider: Andee Lineman MD, Michelle Piper.  INR POC: 3.4.  Cuvette Lot#: [id number].  Exp: 10/11.    Anticoagulation Management Assessment/Plan:      [name] current anticoagulation dose is Warfarin sodium 5 mg tabs: take as directed per coumadin clinic.  The target INR is 2 - 3.  The next INR is due 07/30/2010.  Anticoagulation instructions were given to patient.  Results were reviewed/authorized by Vashti Hey, RN.  She was notified by Vashti Hey RN.         Prior Anticoagulation Instructions: INR 2.4 Continue coumadin 2.5mg  once daily except 5mg  on S,T,Th  Current Anticoagulation Instructions: INR 3.4 Take coumadin 1/2 tablet tonight then resume 1/2 tablet once daily except 1 tablet on S,T,Th Increase greens Was swithched fron simvastatin to lipitor 1 month ago

## 2010-07-30 ENCOUNTER — Encounter: Payer: Self-pay | Admitting: Cardiology

## 2010-07-30 ENCOUNTER — Encounter (INDEPENDENT_AMBULATORY_CARE_PROVIDER_SITE_OTHER): Payer: Medicare Other

## 2010-07-30 DIAGNOSIS — Z7901 Long term (current) use of anticoagulants: Secondary | ICD-10-CM

## 2010-07-30 DIAGNOSIS — I4891 Unspecified atrial fibrillation: Secondary | ICD-10-CM

## 2010-08-08 NOTE — Medication Information (Signed)
Summary: ccr-lr  Lab Visit  Orders Today:  Anticoagulant Therapy  Managed by: Vashti Hey, RN Referring MD: Jerral Bonito PCP: Fanny Skates Supervising MD: Diona Browner MD, Remi Deter Indication 1: Atrial Fibrillation (ICD-427.31) Lab [facility]: [facility] [place] Site: [facility] INR POC 1.8  Dietary changes: no    Health status changes: no    Bleeding/hemorrhagic complications: no    Recent/future hospitalizations: no    Any changes in medication regimen? no    Recent/future dental: no  Any missed doses?: no       Is patient compliant with meds? yes         Anticoagulation Management History:      The patient is taking warfarin and comes in today for a routine follow up visit.  Positive risk factors for bleeding include an age of 73 years or older and history of CVA/TIA.  The bleeding index is 'intermediate risk'.  Positive CHADS2 values include History of HTN and Prior Stroke/CVA/TIA.  Negative CHADS2 values include Age > 73 years old.  The start date was 06/23/2006.  Anticoagulation responsible provider: Diona Browner MD, Remi Deter.  INR POC: 1.8.  Cuvette Lot#: [id number].  Exp: 10/11.    Anticoagulation Management Assessment/Plan:      [name] current anticoagulation dose is Warfarin sodium 5 mg tabs: take as directed per coumadin clinic.  The target INR is 2 - 3.  The next INR is due 08/27/2010.  Anticoagulation instructions were given to patient.  Results were reviewed/authorized by Vashti Hey, RN.  She was notified by Vashti Hey RN.         Prior Anticoagulation Instructions: INR 3.4 Take coumadin 1/2 tablet tonight then resume 1/2 tablet once daily except 1 tablet on S,T,Th Increase greens Was swithched fron simvastatin to lipitor 1 month ago  Current Anticoagulation Instructions: INR 1.8 Take coumadin 7.5mg  tonight then resume 2.5mg  once daily except 5mg  on S,T,Th

## 2010-08-27 ENCOUNTER — Encounter (INDEPENDENT_AMBULATORY_CARE_PROVIDER_SITE_OTHER): Payer: Medicare Other

## 2010-08-27 ENCOUNTER — Encounter: Payer: Self-pay | Admitting: Cardiology

## 2010-08-27 DIAGNOSIS — I4891 Unspecified atrial fibrillation: Secondary | ICD-10-CM

## 2010-08-27 DIAGNOSIS — Z7901 Long term (current) use of anticoagulants: Secondary | ICD-10-CM

## 2010-09-03 NOTE — Medication Information (Signed)
Summary: ccr  Anticoagulant Therapy  Managed by: Wanda Mckenzie, Wanda Mckenzie Referring MD: Jerral Bonito PCP: Fanny Skates Supervising MD: Myrtis Ser MD, Tinnie Gens Indication 1: Atrial Fibrillation (ICD-427.31) Lab [facility] Site: [facility] INR POC 2.2  Dietary changes: no    Health status changes: no    Bleeding/hemorrhagic complications: no    Recent/future hospitalizations: no    Any changes in medication regimen? no    Recent/future dental: no  Any missed doses?: no       Is patient compliant with meds? yes       Allergies: 1)  ! Codeine  Anticoagulation Management History:      The patient is taking warfarin and comes in today for a routine follow up visit.  Positive risk factors for bleeding include an age of 73 years or older and history of CVA/TIA.  The bleeding index is 'intermediate risk'.  Positive CHADS2 values include History of HTN and Prior Stroke/CVA/TIA.  Negative CHADS2 values include Age > 8 years old.  The start date was 06/23/2006.  Anticoagulation responsible provider: Myrtis Ser MD, Tinnie Gens.  INR POC: 2.2.  Cuvette Lot#: [id number].  Exp: 10/11.    Anticoagulation Management Assessment/Plan:      [name] current anticoagulation dose is Warfarin sodium 5 mg tabs: take as directed per coumadin clinic.  The target INR is 2 - 3.  The next INR is due 09/24/2010.  Anticoagulation instructions were given to patient.  Results were reviewed/authorized by Wanda Mckenzie, Wanda Mckenzie.  She was notified by Wanda Mckenzie Wanda Mckenzie.         Prior Anticoagulation Instructions: INR 1.8 Take coumadin 7.5mg  tonight then resume 2.5mg  once daily except 5mg  on S,T,Th  Current Anticoagulation Instructions: INR 2.2 Continue coumadin 2.5mg  once daily except 5mg  on S,T,Th

## 2010-09-16 ENCOUNTER — Encounter: Payer: Self-pay | Admitting: Cardiology

## 2010-09-16 DIAGNOSIS — Z7901 Long term (current) use of anticoagulants: Secondary | ICD-10-CM

## 2010-09-16 DIAGNOSIS — I4891 Unspecified atrial fibrillation: Secondary | ICD-10-CM

## 2010-09-16 DIAGNOSIS — I6350 Cerebral infarction due to unspecified occlusion or stenosis of unspecified cerebral artery: Secondary | ICD-10-CM

## 2010-09-24 ENCOUNTER — Ambulatory Visit (INDEPENDENT_AMBULATORY_CARE_PROVIDER_SITE_OTHER): Payer: Medicare Other | Admitting: *Deleted

## 2010-09-24 DIAGNOSIS — I6350 Cerebral infarction due to unspecified occlusion or stenosis of unspecified cerebral artery: Secondary | ICD-10-CM

## 2010-09-24 DIAGNOSIS — I4891 Unspecified atrial fibrillation: Secondary | ICD-10-CM

## 2010-09-24 DIAGNOSIS — Z7901 Long term (current) use of anticoagulants: Secondary | ICD-10-CM

## 2010-10-22 ENCOUNTER — Ambulatory Visit (INDEPENDENT_AMBULATORY_CARE_PROVIDER_SITE_OTHER): Payer: Medicare Other | Admitting: *Deleted

## 2010-10-22 DIAGNOSIS — I6350 Cerebral infarction due to unspecified occlusion or stenosis of unspecified cerebral artery: Secondary | ICD-10-CM

## 2010-10-22 DIAGNOSIS — Z7901 Long term (current) use of anticoagulants: Secondary | ICD-10-CM

## 2010-10-22 DIAGNOSIS — I4891 Unspecified atrial fibrillation: Secondary | ICD-10-CM

## 2010-11-05 NOTE — Op Note (Signed)
Wanda Mckenzie, Wanda Mckenzie               ACCOUNT NO.:  [id number]   MEDICAL RECORD NO.:  [id number]          PATIENT TYPE:  INP   LOCATION:  5023                         FACILITY:  [facility]   PHYSICIAN:  Vanita Panda. Magnus Ivan, M.D.DATE OF BIRTH:  March 04, 1936   DATE OF PROCEDURE:  02/02/2007  DATE OF DISCHARGE:                               OPERATIVE REPORT   PREOPERATIVE DIAGNOSIS:  Left hip femoral neck fracture.   POSTOPERATIVE DIAGNOSIS:  Left hip femoral neck fracture.   PROCEDURE:  Left hip hemiarthroplasty.   COMPONENTS:  DePuy [place] porous coated stem size 6ix, size 48 bipolar  head with plus 5 length neck.   SURGEON:  Vanita Panda. Magnus Ivan, M.D.   ANESTHESIA:  General.   ANTIBIOTICS:  1 gram IV Ancef.   BLOOD LOSS:  300 mL.   COMPLICATIONS:  None.   INDICATIONS:  Briefly, Wanda Mckenzie is a 75 year old who was out in her  yard and sustained a mechanical fall when she actually tripped over a  root.  She landed on her left hip and sustained a femoral neck fracture.  She was admitted to the general medicine service and due to her INR  being greater than 2 secondary to being on Coumadin, we allowed this to  come down.  She now presents for definitive fixation of the fracture and  the recommendation for a displaced femoral neck fracture is a  hemiarthroplasty.  The risks and benefits have been explained to her and  well understood and she agreed to proceed with surgery.   DESCRIPTION OF PROCEDURE:  After informed consent was obtained  and the  appropriate left leg was marked, she was brought to the operating room  and placed supine on the operating table.  General anesthesia was  obtained. She was then positioned in a lateral decubitus position with  the right hip down and the operative left hip up.  An axillary roll was  placed under her arm and the arms and legs were well padded.  Her leg  was then prepped and draped with DuraPrep down to the ankle and sterile  drapes were  used including a sterile stockinette.  A time out was  called, she was identified as the correct patient and correct extremity.   I then took an anterior lateral approach to the hip with a lateral skin  incision directly over the greater trochanter and carried it proximally  and distally.  I dissected down to the iliotibial band, coagulation and  hemostasis was obtained with the electrocautery.  I then divided the  iliotibial band longitudinally to expose the greater trochanter and the  gluteus medius and minimus tendons.  I then took the anterior lateral  approach to the hip and took the sleeve of the gluteus medius and  minimus off the insertion of the greater trochanter approximately 2-3  fingerbreadths. I carried this anteriorly to expose the joint capsule.  Once the joint capsule was divided in a T-type fashion, hematoma was  encountered.  I suctioned out the hematoma and the femoral neck fracture  was easily seen.  I then made a neck  cut about 1 fingerbreadth proximal  to the lesser trochanter and was able to remove the fractured head as a  single unit.  The acetabulum was cleaned of debris and I irrigated the  wound copiously with pulsatile lavage solution.   Next, I trialed a 47 head and the 48 head and decided on the size 48  bipolar head.  Next, with a good anterior approach, I was able to flex  and externally rotate the hip off the table exposing the femoral canal.  I used a cookie cutter guide to expose the femur and then a canal  finder. Once this was done, I used a lateralizer and then reamed in 1 mm  increments from a size 0 up to the 6/7 reamer with the 6 reamer found to  be more appropriate.  We then started with a size 1 broach and broached  up to a size 6 and a size 6 broach was felt to be stable.  Next, I chose  a plus 5 neck with the bipolar head and placed all trial components in  the hip and was able to easily reduce the hip.  I put the hip through a  range of  motion.  The leg lengths were felt to be near equal.  There was  a good shuck and the range of motion felt to be stable, not too tight or  too loose.  I then removed all trial components and used pulsatile  lavage to completely wash the acetabulum, the femur, and the surrounding  tissues.  This was all suctioned out and then we chose the real  components and a DePuy proximal porous coated stem was placed without  complications.  The final head segment was placed, as well, and the hip  was reduced back into the acetabulum.  With this, the range of motion,  again, was felt to be stable.   I then copiously irrigated the wounds again and then closed the capsule  with #1 Ethibond suture.  I then reapproximated the gluteus medius and  gluteus minimus tendons back to their insertion site on the greater  trochanter and oversewed this with a #1 Ethibond suture, as well.  The  iliotibial band was closed with interrupted #1 Vicryl, 2-0 Vicryl in the  deep tissue, and 2-0 Vicryl in the subcutaneous tissue, as well.  Staples were used to close the skin.  A dressing was applied to the hip.  After this, the patient was then rolled into a supine position.  Her leg  lengths were found to be near equal.  She was awakened, extubated, and  taken to the recovery room in stable condition.  All final counts were  correct and there were no complications noted.      Vanita Panda. Magnus Ivan, M.D.  Electronically Signed     CYB/MEDQ  D:  02/02/2007  T:  02/03/2007  Job:  [id number]

## 2010-11-05 NOTE — H&P (Signed)
Wanda Mckenzie, Wanda Mckenzie NO.:  [id number]   MEDICAL RECORD NO.:  [id number]          PATIENT TYPE:  EMS   LOCATION:  MAJO                         FACILITY:  [facility]   PHYSICIAN:  Lonia Blood, M.D.DATE OF BIRTH:  06-Feb-1936   DATE OF ADMISSION:  02/01/2007  DATE OF DISCHARGE:                              HISTORY & PHYSICAL   PRIMARY CARE PHYSICIAN:  Dr. Etheleen Mayhew to the [place] area.   PHYSICIANS:  Cardiologist Dr. Myrtis Ser, orthopedist Dr. Magnus Ivan.   CHIEF COMPLAINT:  Left hip fracture with multiple medical problems.   HISTORY OF PRESENT ILLNESS:  [name] is a very pleasant 75-  year-old female who has a known history of atrial fibrillation,  uncontrolled hypertension, and hyperlipidemia.  She has a prior history  of CVA's x2.  She suffered a fall today in which she ultimately suffered  a left femoral neck fracture.  She presented to the ER after her fall  because of severe pain in the left hip, and the diagnosis was made.  Because of the patient's Coumadin therapy, she is not fit for surgery in  the acute setting.  Because of the patient's atrial fibrillation,  uncontrolled hypertension, and hyperlipidemia, medical service has been  asked to evaluate the patient.  The patient will be admitted to the  medical service initially and then transitioned to the orthopedic  service as long as she remains medically stable on the date of surgery.   At the time of my evaluation the patient is resting comfortably on  hospital stretcher having received narcotic pain medication in the  emergency room.  She denies any antecedent chest pain, syncope,  shortness of breath, dyspnea on exertion.  She reports that her atrial  fibrillation has been well-controlled in the outpatient setting.  She  has not noticed any dyspnea on exertion, any lower extremity edema or  any unilateral edema.   REVIEW OF SYSTEMS:  Comprehensive Review of Systems is unremarkable  with  the exception of the positive elements noted in History of Present  Illness.   PAST MEDICAL HISTORY:  1. Uncontrolled hypertension.  2. Hyperlipidemia.  3. Paroxysmal atrial fibrillation.  4. History of left occipital CVA.  5. History of right frontal MCA, CVA in June 2007.   OUTPATIENT MEDICATION:  1. Zocor 80 mg daily.  2. Atenolol 25 mg daily.  3. Diltiazem XR 240 mg daily.  4. Coumadin 5 mg daily.  5. Imdur 30 mg daily.  6. Antara 130 mg daily.  7. HCTZ 12.5 mg daily.   ALLERGIES:  CODEINE.   FAMILY HISTORY:  Noncontributory to this admission.   SOCIAL HISTORY:  The patient does not smoke or drink.  [place]   DATA REVIEWED:  Hemoglobin is normal, MCV normal, platelet count normal,  white count is elevated at 14.2.  INR is elevated at 2.3, PTT normal.  Basic metabolic panel is unremarkable with exception of mildly elevated  BUN and creatinine ratio with BUN 20 and creatinine 1.2.  BNP is normal.   CT scan of the head reveals no acute  disease.  Chest x-ray reveals mild  cardiomegaly and pulmonary vascular congestion but no acute findings,  otherwise.  EKG has not been accomplished in the emergency room.   PHYSICAL EXAMINATION:  VITAL SIGNS:  Temperature 97.5, blood pressure  178/77, respiratory rate 24, O2 sats 92% on room air.  GENERAL:  A well-developed, well-nourished female in no acute  respiratory distress.  LUNGS:  Clear to auscultation bilaterally without wheezing or rhonchi.  CARDIOVASCULAR:  Regular rate and rhythm at present without murmur,  gallop or rub with normal S1 and S2.  ABDOMEN:  Nontender, nondistended, soft, bowel sounds present.  No  hepatosplenomegaly, no rebound, no ascites.  EXTREMITIES:  No significant cyanosis or clubbing bilateral lower  extremities, 1+ left lower extremity edema.  VASCULAR:  2+ dorsalis pedis pulses bilateral lower extremities.  NEUROLOGIC:  Alert and oriented x4, cranial nerves II-XII  intact  bilaterally, 5/5 strength upper and lower extremities except left lower  extremity is not tested due to the patient's acute fracture.  There is  no Babinski.   IMPRESSION/PLAN:  1. Left femoral neck fracture - the patient will be admitted initially      to the medical service for medical stabilization/reversal of her      Coumadin therapy.  The patient will then be transitioned to      orthopedic service at the time of her surgery which is at present      scheduled for Tuesday probably late afternoon.  2. Atrial fibrillation - the patient is in normal sinus rhythm at      present time.  Her heart rate is well-controlled.  We will continue      her Diltiazem and atenolol.  We will of course have to discontinue      her Coumadin as discussed below.  3. Coagulopathy - the patient's INR is elevated due to her ongoing      Coumadin therapy.  It is in the therapeutic range.  There is no      evidence of spontaneous bleeding.  I have discussed the need to      discontinue the patient's Coumadin with the patient.  I have      discussed the increased risk of stroke in the setting of paroxysmal      atrial fibrillation with with inadequate anticoagulation.  She      voices understanding of this.  We will reverse her using 10 mg of      IV vitamin K.  We will recheck INR later today and dose the patient      further as necessary.  4. Home-controlled hypertension - the patient is not taking her blood      pressure medications this evening.  She usually takes her medicines      at night.  We will begin an acute a.m. dosing schedule of her usual      antihypertensives and follow her blood pressure accordingly.  5. Hyperlipidemia.  Will continue the patient's outpatient      medications.  6. Leukocytosis - I feel that this is most likely a stress      demargination.  We will hydrate the patient and not treat her pain      and follow her white count.     Lonia Blood, M.D.   Electronically Signed    JTM/MEDQ  D:  02/01/2007  T:  02/01/2007  Job:  [id number]

## 2010-11-05 NOTE — Assessment & Plan Note (Signed)
[facility]                          [facility] NOTE   Wanda Mckenzie, Wanda Mckenzie                      MRN:          [id number]  DATE:04/30/2007                            DOB:          Sep 14, 1935    Ms. Dieterich is actually doing well.  She has a history of atrial  fibrillation.  She did have neurologic event in the past.  She is on  Coumadin, and her INR will be checked today.  She has been doing  relatively well.  She did fall and break her hip over time.  This was a  fall and not syncope.  She is recovering and doing well at this point.   PAST MEDICAL HISTORY:   ALLERGIES:  CODEINE.   MEDICATIONS:  1. Imdur.  2. Diltiazem XR.  3. Simvastatin.  4. Hydrochlorothiazide.  5. Coumadin.  6. Fenofibrate.   OTHER MEDICAL PROBLEMS:  See the list below.   REVIEW OF SYSTEMS:  She is feeling well.  She is not having chest pain.  Overall, her review of systems is negative.   PHYSICAL EXAMINATION:  Blood pressure is 130/76, pulse is 64.  The patient is oriented to person, time, and place.  Affect is normal.  HEENT:  Reveals no xanthelasma.  She has normal extraocular  motion.  She is walking with a cane today.  LUNGS:  Clear.  Respiratory effort is not labored.  CARDIAC EXAM:  Reveals an S1 with an S2.  There are no clicks or  significant murmurs.  The abdomen is soft.  There are no masses or bruits.  She has slight edema in her left leg which is chronic.   PROBLEM LIST:  1. History of an acute, nonhemorrhagic, right middle cerebral artery      infarct in the past, and we think it was probably related to atrial      fibrillation and she is on Coumadin.  2. Coumadin therapy.  3. Good left ventricular function.  4. Risk factors for coronary disease.  5. Hypertension, treated.  6. Hyperlipidemia, treated.  7. Paroxysmal atrial fibrillation and chronic atrial fibrillation, and      her rate is controlled.  8. History of flushing, related to  Niaspan.  9. Episode in the past of an unexplained episode of hypotension.  The      etiology was never clear.  We did rule out pheochromocytoma.  She      has been stable.   No further cardiac workup.  Followup in 1 year.     Luis Abed, MD, [facility]  Electronically Signed    JDK/MedQ  DD: 04/30/2007  DT: 05/01/2007  Job #: [id number]   cc:   Delaney Meigs, M.D.

## 2010-11-05 NOTE — Assessment & Plan Note (Signed)
[facility]                          [facility] NOTE   ADELMA, BOWDOIN                      MRN:          [id number]  DATE:05/01/2008                            DOB:          1936/05/15    Ms. Zeimet is doing well.  She has paroxysmal atrial fibrillation and  currently she is in atrial fibrillation.  She does not feel  palpitations.  She goes about full activities.  She is on Coumadin.  She  had an acute non-hemorrhagic right middle cerebral artery infarct in the  past that we think was probably related to atrial fib.  All the more  reason why her Coumadin is so important.  She is actually doing well.  She has not had any chest pain.  There is no shortness of breath.  She  goes about full activities.   PAST MEDICAL HISTORY:   ALLERGIES:  CODEINE.   MEDICATIONS:  Imdur, diltiazem XR, simvastatin, hydrochlorothiazide,  Coumadin, fenofibrate, atenolol, fish oil, Fosamax, calcium, and  multivitamin.  She also takes another vitamin containing a one of the B  vitamins and I have asked her to ask Dr. Lysbeth Galas about this.   OTHER MEDICAL PROBLEMS:  See the list on my note of November 2008.   REVIEW OF SYSTEMS:  She has no GI or GU symptoms.  She has no fevers or  chills.   Review of systems otherwise is negative.   PHYSICAL EXAMINATION:  VITAL SIGNS:  Blood pressure is 150/88 with a  pulse of 66.  Weight is 208 pounds.  HEENT:  No xanthelasma.  She has  normal extraocular motion.  NECK:  There are no carotid bruits.  There is no jugular venous  distention.  LUNGS:  Clear.  Respiratory effort is not labored.  CARDIAC:  An S1 with an S2.  There are no clicks or significant murmurs.  ABDOMEN:  Soft.  She has no peripheral edema.   EKG reveals atrial fibrillation with a controlled ventricular response.  There are nonspecific ST-T wave changes.  Her INR today is 2.3.   Problems listed on the note of April 30, 2007.  #7.  Atrial  fibrillation.  The rate is controlled.  There is no need to  change her medications.  She is bringing me copies of her lipids.   I will see her back in 1 year for cardiology followup.     Luis Abed, MD, [facility]  Electronically Signed    JDK/MedQ  DD: 05/01/2008  DT: 05/02/2008  Job #: [id number]   cc:   Delaney Meigs, M.D.

## 2010-11-08 NOTE — Assessment & Plan Note (Signed)
[facility]                                 ON-CALL NOTE   RENELL, COAXUM                      MRN:          [id number]  DATE:06/24/2006                            DOB:          Jul 06, 1935    PRIMARY CARDIOLOGIST:  Dr. Luis Abed   TELEPHONE CONTACT NOTE AS FOLLOWS:   Received a call from [facility] this evening saying they had noticed high  heart rate on Ms. Stouffer with a heart rate that was in the 140's for  greater than 30 seconds.  They called per their parameters. They did say  the patient was asymptomatic but I was unable to contact her and confirm  this. I left her a message to call us if she was having any symptoms.  She is to follow-up with Dr. Myrtis Ser for cardiology, and with Dr. Pearlean Brownie for  neurology. I advised her that if she was having any symptoms or any  issues or difficulties, she should come directly to the emergency room  by EMS. I will attempt to contact her again later this evening.      Theodore Demark, PA-C  Electronically Signed      Salvadore Farber, MD  Electronically Signed   RB/MedQ  DD: 06/24/2006  DT: 06/24/2006  Job #: [id number]

## 2010-11-08 NOTE — Discharge Summary (Signed)
[name], [name]               ACCOUNT NO.:  [id number]   MEDICAL RECORD NO.:  [id number]          PATIENT TYPE:  INP   LOCATION:  3041                         FACILITY:  [facility]   PHYSICIAN:  Pramod P. Pearlean Brownie, MD    DATE OF BIRTH:  [birth year]   DATE OF ADMISSION:  12/02/2005  DATE OF DISCHARGE:                                 DISCHARGE SUMMARY   DISCHARGE DIAGNOSES:  1.  Right frontal middle cerebral artery infarct thought to be embolic,      though no evidence was found.  2.  Old left occipital infarct 1 year ago.  3.  Hypertension.  4.  Dyslipidemia.  5.  Irregular heartbeat for which she sees Dr. Myrtis Ser.   DISCHARGE MEDICATIONS:  1.  Aggrenox 1 a day x 2 weeks then increase to b.i.d.  2.  Aspirin 81 mg a day x 2 weeks then discontinue.  3.  Tylenol 650 mg 2 tablets 30 minutes to 1 hour before Aggrenox x 1 week      then discontinue.  4.  Atenolol 25 mg a day.  5.  Zocor 80 mg a day.  6.  Antara 130 mg a day.  7.  Diltiazem-XR 120 mg a day.  8.  Hydrochlorothiazide 12.5 mg a day.  9.  Isosorbide mononitrate 30 mg a day.  10. Multivitamin a day.  11. Nitroglycerin as needed.   STUDIES PERFORMED:  1.  CT of the brain on admission showed acute non-hemorrhagic infarct on the      posterior right frontal lobe.  Minimal mass effect on the right frontal      horn but no midline shift.  Old right occipital lobe infarct.  2.  Chest x-ray shows mild cardiomegaly, no active disease.  3.  MRI of the brain shows moderate-sized acute partial hemorrhagic right      frontal lobe infarct.  Followup until this has progressed in the      expected fashion recommended to exclude underlying abnormality.  Old      right occipital infarct behind the old left cerebellar infarct mild      small-vessel disease-type changes.  4.  MRA of the head shows abrupt cutoff of the right frontal lobe and right      occipital lobe artery supplies, consistent with the patient's acute and      chronic infarct,  branch vessel atherosclerotic type changes.  5.  Transcranial Doppler has been completed, results pending.  6.  A 2-D echocardiogram shows an ejection fraction of 60% with no      diagnostic evidence of left ventricular regional wall motion      abnormalities.  LV function and valve are normal.  Atrial septum cannot      be seen well so no comment can be made about PSO.  7.  Carotid Doppler shows no significant ICA stenosis.  8.  Transesophageal echocardiogram is normal with ASD, no PSO, negative      bubble study.  9.  EKG shows a sinus bradycardia with nonspecific ST changes.   LABORATORY DATA:  CBC normal, differential normal.  Coagulation studies  normal.  Chemistry with potassium 3.4, otherwise normal.  Liver function  tests normal.  Hemoglobin A1c 6.3.  Homocystine is elevated at 17.5.  Cholesterol 179, triglycerides 114, HDL 33, LDL 93.  Urinalysis was normal.  Cardiac enzymes negative x1.   HISTORY OF PRESENT ILLNESS:  [name] is a 75 year old female who  has a history of hypertension, hypercholesterolemia, and stroke in July 2006  who was driving home the evening prior to admission when she noticed sudden  onset of numbness in her left upper extremity.  She had no other symptoms.  She came home and her family noticed that she was definitely confused.  For  example, she was taking off and putting on clothes, which is unusual for  her.  She tried to get dressed for an occasion, which was already over and  was somewhat disoriented to time.  Her speech was slightly slurred.  She  declined to go to the emergency room for evaluation that evening.  The next  morning, she woke up with a dull frontal headache but her speech, confusion,  and other symptoms seemed to be a little bit better.  She presented to the  [facility] Room and had a CT of the head, which demonstrated an  acute stroke in the right frontal region.  The patient denies any history of  similar  symptoms.  She has some baseline shortness of breath but this is not  necessarily worse.  She was admitted to the hospital for stroke evaluation.   HOSPITAL COURSE:  An MRI confirmed new, current stroke also a history of  bilateral cerebral infarcts, leading [place] to suspect a cardioembolic source.  Her 2-D echocardiogram was unrevealing.  Therefore, a TEE was performed,  which was also unrevealing.  She has been scheduled to follow up with Dr.  Pearlean Brownie in his office at discharge and will do an transcranial Doppler, emboli  monitoring study and bubble study.  From the stroke perspective, the patient  has returned to baseline, has no therapy needs, and is safe to go home.  She  was changed from Plavix and aspirin to Aggrenox for secondary stroke  prevention.  She does have other vascular risk factors of hypertension and  dyslipidemia, which are currently being controlled.  She also has a new  diagnosis of hyperhomocysteinemia for which Foltx was started.   CONDITION ON DISCHARGE:  The patient is alert and oriented x 3.  No aphasia  or dysarthria.  She has an old left inferior quadrantanopsia.  She has no  sensory or strength deficit.   DISCHARGE PLAN:  1.  Discharged home with family.  2.  Aggrenox for secondary stroke prevention.  3.  Follow up with Dr. Marlis Edelson office on Thursday, July 5 at 10 a.m. for a      transcranial Doppler, bubble study, and emboli monitoring.  4.  Follow up with Dr. Pearlean Brownie in 2 to 3 months.  5.  Follow up with Dr. Vernell Leep within 1 month.           ______________________________  Wanda Mckenzie. Pearlean Brownie, MD     PPS/MEDQ  D:  12/04/2005  T:  12/04/2005  Job:  [id number]   cc:   Chaney Born  Fax: [phone]   Pramod P. Pearlean Brownie, MD  Fax: [phone]

## 2010-11-08 NOTE — Discharge Summary (Signed)
NAMELONEY, PETO               ACCOUNT NO.:  [id number]   MEDICAL RECORD NO.:  [id number]          PATIENT TYPE:  INP   LOCATION:  5023                         FACILITY:  [facility]   PHYSICIAN:  Vanita Panda. Magnus Ivan, M.D.DATE OF BIRTH:  04/27/1936   DATE OF ADMISSION:  02/01/2007  DATE OF DISCHARGE:  02/10/2007                               DISCHARGE SUMMARY   ADMITTING DIAGNOSIS:  Left hip fracture as a primary admitting  diagnosis.   SECONDARY DIAGNOSES:  1. History of atrial fibrillation.  2. History of uncontrolled hypertension.  3. History of hyperlipidemia.  4. History of cerebrovascular accidents x2.  5. History of multiple falls.   DISCHARGE DIAGNOSIS:  Status post left hemiarthroplasty secondary to hip  fracture.   SECONDARY DIAGNOSES:  1. Uncontrolled hypertension.  2. Hyperlipidemia.  3. Atrial fibrillation.  4. History of 2 previous strokes.   HOSPITAL COURSE:  Briefly Ms. Wilford is a 70-year female with the known  aforementioned multiple medical problems.  On day of admission she  presented to the ER with severe pain after a fall.  The patient was on  Coumadin therapy and was at a therapeutic level.  She was found to have  a left hip fracture that was completely displaced.  General medical  service admitted her due to her multiple medical problems, and then  orthopedics was consulted.  I talked to the family and the patient at  length about the need for hip hemiarthroplasty, and the general medicine  service felt that I could proceed the day after admission once they had  her Coumadin reversed.  I talked with the medicine service about  transitioning her and transferring her to the orthopedic surgery service  once the surgery was completed, and they agreed to make this transition.  She was taken to the operating room on February 02, 2007 where she  underwent a left hip hemiarthroplasty without complication.  For  detailed description of the operation please  refer to the dictated  operative note in the patient's medical record.   Postoperatively, her Coumadin therapy was reinitiated, and by the day of  discharge she was therapeutic back on her Coumadin.  She began  ambulating with putting weightbearing as tolerated on her left hip and  seemed to progress quite well.  It was recommended that she undergo  transition to a skilled nursing home facility versus home health, and by  the day of discharge the patient and the family preferred the home  health rather than a skilled nursing facility.  Remainder of  hospitalization was uncomplicated.  She was transitioned to regular pain  medications regularly and was tolerating regular diet.  By day of  discharge she was deemed safe from a therapy standpoint for transition  back to home with close home health followup.   DISPOSITION:  To home.   DISCHARGE INSTRUCTIONS:  While she is at home she will continue on her  Coumadin as previous levels.  She is back to being therapeutic on this  from her atrial fibrillation.  She will continue all her same home  medications as before and  continue work with physical therapy with  weightbearing as tolerated on her leg with anterior hip precautions.  Followup will be obtained in the office as appropriate.      Vanita Panda. Magnus Ivan, M.D.  Electronically Signed     CYB/MEDQ  D:  03/09/2007  T:  03/10/2007  Job:  [id number]

## 2010-11-08 NOTE — Assessment & Plan Note (Signed)
[facility]                          [facility] OFFICE NOTE   STORI, ROYSE                      MRN:          [id number]  DATE:09/17/2006                            DOB:          June 01, 1936    Ms. Masser is seen for a followup. She is doing relatively well. She  follows her blood pressure very carefully. On Monday the 17th at around  noon, she had an episode of feeling poorly. She felt she almost  fainted. She took her pressure and it was 66/39. This clearly is much  lower than her other pressures. I have encouraged her that if she begins  to feel dizzy that the very first thing she should do is sit down and  lie down. I repeated this several times during the visit.   She can not give me any specific problems that were occurring on the  day. Etiology of this sudden blood pressure drop is not clear to me.   We know that she has paroxysmal atrial fibrillation. She is on Coumadin  therapy.   PAST MEDICAL HISTORY:   ALLERGIES:  CODEINE.   MEDICATIONS:  1. Antara 130.  2. Hydrochlorothiazide 12.5.  3. Multivitamin.  4. Fish oil.  5. Atenolol 25.  6. Simvastatin.  7. Niaspan (to be held).  8. Vitamins.  9. Coumadin as directed.  10.Imdur 30.  11.Diltiazem extended release 240 mg.   OTHER MEDICAL PROBLEMS:  See the list below.   REVIEW OF SYSTEMS:  Overall she is doing relatively well. However, of  course we are both concerned about her episode. She does have some  moderate variation in her blood pressure even without this 1 episode.  The patient also relates that she has flushing in the middle of the  night. This may well be related to her Niaspan. She is no longer on  aspirin as she is on Coumadin. Therefore we do not have a good medicine  to help with the flushing. I wonder also if this sensation in the middle  of the night could possibly be a variation in her blood pressure.   Her review of systems otherwise is  negative.   PHYSICAL EXAMINATION:  Blood pressure today is 150/82, with a pulse of  61, weight is 208 pounds. The patient is oriented to person, time, and,  place. Affect is normal.  HEENT: Reveals no xanthelasma. She has normal extraocular motion. There  are no carotid bruits. There is no jugular venous distension.  LUNGS: Clear. Respiratory effort is not labored.  CARDIAC EXAM: Reveals an  S1, with an S2. There are no clicks or  significant murmurs.  ABDOMEN: Soft. There are no masses or bruits.  The patient does have peripheral edema in her left ankle. She had an  injury to this area in the past. Her weight is up 1 pound. We will  follow her overall volume status.   EKG today shows that the patient still has atrial fibrillation. However,  her rate is nicely controlled in the range of 65.   PROBLEMS:  Include;  1. History of an acute  non hemorrhagic right middle cerebral artery      infarct. We think that this may be related to her atrial      fibrillation and she is on Coumadin.  2. Coumadin therapy.  3. Good left ventricular function.  4. Risk factors for coronary disease.  5. Hypertension.  6. Hyperlipidemia, being treated.  7. Paroxysmal atrial fibrillation, and chronic atrial fibrillation.      Her rate is now nicely controlled.  8. Flushing in the middle of the night that may be related to her      Niaspan.  9. Episode of unexplained hypotension for which she became dizzy. At      this point the etiology remains unclear to me. However, with the      variation in her pressures I feel that it is appropriate to rule      out pheochromocytoma. This seems unusual but we will collect a 24      hour urine to be complete. Also it is possible that she has delayed      effect from her Niaspan. I have asked her to put her Niaspan on      hold. I will then see her back for follow up.     Luis Abed, MD, [facility]  Electronically Signed    JDK/MedQ  DD: 09/17/2006  DT:  09/17/2006  Job #: [id number]   cc:   Delaney Meigs, M.D.

## 2010-11-08 NOTE — Assessment & Plan Note (Signed)
**Wanda Wanda** Wanda Wanda                          Wanda Wanda   Wanda Wanda, Wanda Wanda                      MRN:          [id number]  DATE:06/08/2006                            DOB:          [birth year]    PRIMARY CARDIOLOGIST:  Luis Abed, MD, [facility].   REASON FOR VISIT:  Scheduled 6 month followup.   Wanda Wanda is a very pleasant 75 year old female who now returns for  scheduled 6 month followup. Since last seen here in the clinic in June;  however, the patient was subsequently hospitalized at [facility] with an  acute right middle cerebral artery stroke. She had extensive workup with  a CT scan of the brain suggesting an acute nonhemorrhagic infarct with  an old right occipital lobe infarct. MRA of the head showed abrupt cut  off the right frontal lobe and right occipital lobe consistent with the  patient's acute and chronic infarct.   A 2-D echocardiogram showed normal left ventricular function with no  wall motion abnormality and the atrial septum was not well visualized. A  transesophageal echocardiogram was also done in followup, but this  apparently was unrevealing. The patient also had transcranial and  carotid Dopplers, with the carotid Doppler showing no significant ICA  stenosis.   Medications were adjusted with substitution of Plavix and aspirin with  Aggrenox.   From a clinical standpoint, the patient reports no residual left upper  extremity numbness or weakness. However, she continues to remain  chronically fatigued.   From a cardiac standpoint, the patient denies any exertional chest  discomfort or dyspnea but has chronic longstanding palpitations which  occur perhaps once a month. There are no significant associated symptoms  with this.   CURRENT MEDICATIONS:  1. Antara 130 daily.  2. Hydrochlorothiazide 12.5 daily.  3. Diltiazem XR 120 daily.  4. Fish oil 300 mg daily.  5. Atenolol 25 daily.  6. Isosorbide 30  daily.  7. Simvastatin 80 daily.  8. Niaspan 500 q.h.s.  9. Aggrenox 1 tablet twice daily.   REVIEW OF SYSTEMS:  As noted per HPI, remaining systems negative.   PHYSICAL EXAMINATION:  VITAL SIGNS:  Blood pressure 192/84, pulse 60,  regular, weight 205.  GENERAL:  A 75 year old female sitting upright in no distress.  NECK:  Palpable bilateral carotid pulses without bruits.  LUNGS:  Clear to auscultation in all fields.  HEART:  Regular rate and rhythm (S1, S2). A soft grade 2/6 systolic  ejection murmur at the base.  EXTREMITIES:  Palpable pulses with trace edema.  NEUROLOGIC:  Flat affect, but no focal deficit.   IMPRESSION:  1. Status post acute nonhemorrhagic right MCA infarct.      a.     Felt to be embolic; aspirin/Plavix substituted with       Aggrenox.      b.     Unrevealing transesophageal echocardiogram.  2. Longstanding history of palpitations.      a.     Documented history of atrial tachycardia treated with       atenolol and diltiazem.  3. Normal left ventricular function.  4. Multiple cardiac risk factors.      a.     Low risk adenosine stress Cardiolite December 2006.  5. Uncontrolled hypertension.  6. Hyperlipidemia.   PLAN:  1. We will contact Dr. Marlis Edelson office to inquire as to the date at      which Wanda Wanda is scheduled to followup with Dr. Pearlean Brownie. She      reports that she did have the followup studies which were      recommended (i.e. transcranial Doppler, bubble study), but has not      had a formal followup office visit with Dr. Pearlean Brownie for review of      these test results. We will also request copies of these study      results as well.  2. The patient will be scheduled for a [employer] monitor for further      evaluation of this long standing history of palpitations, and to      exclude underlying dysrhythmia such as paroxysmal atrial      fibrillation. Of Wanda, it was felt that the source of the patient's      acute stroke was cardioembolic;  however, there was no definite      evidence of such by echocardiography and followup transesophageal      echocardiography.  3. Increase Imdur from 30-60 daily for better blood pressure      management. If, however, her blood pressure remains in control,      then we may need to consider further adjustment of her current      regimen.  4. We will arrange return clinic followup with Dr. Willa Rough in the      next several weeks,      following her scheduled followup with Dr. Pearlean Brownie, for further      evaluation and recommendations.      Wanda Searing, PA-C  Electronically Signed      Luis Abed, MD, [facility]  Electronically Signed   GS/MedQ  DD: 06/08/2006  DT: 06/08/2006  Job #: [id number]   cc:   Pramod P. Pearlean Brownie, MD

## 2010-11-08 NOTE — Assessment & Plan Note (Signed)
[facility]                                 ON-CALL NOTE   Wanda Mckenzie, Wanda Mckenzie                      MRN:          [id number]  DATE:06/29/2006                            DOB:          [birth year]    PRIMARY CARDIOLOGIST:  Luis Abed, MD, [facility].   TELEPHONE CONTACT NOTE:  I received a call from [employer] stating that  they had noticed a high heart rate on Wanda Mckenzie with a heart rate that  was greater than 140 for greater than 60 seconds.  Wanda Mckenzie was  asymptomatic with this.  She returned to sinus rhythm.  They stated that  this was the third time this had happened since she had the monitor  placed.  They stated that they faxed reports of these episodes to the  office.  I advised them to change the parameters to a heart rate greater  than 150 for greater than 60 seconds.  I advised them to continue to fax  the reports, rapid heart rate and irregular heart rate, even if they  were not obligated to call and report them.  They stated they would do  this.  Wanda Mckenzie is encouraged to keep all followup appointments and  continue her medications.      Theodore Demark, PA-C  Electronically Signed      [name], MD, [facility]  Electronically Signed   RB/MedQ  DD: 06/29/2006  DT: 06/30/2006  Job #: [id number]

## 2010-11-08 NOTE — Op Note (Signed)
   Wanda Mckenzie, Wanda Mckenzie                           ACCOUNT NO.:  [id number]   MEDICAL RECORD NO.:  [id number]                   PATIENT TYPE:  OIB   LOCATION:  2895                                 FACILITY:  [facility]   PHYSICIAN:  Larina Earthly, M.D.                 DATE OF BIRTH:  [birth year]   DATE OF PROCEDURE:  05/23/2002  DATE OF DISCHARGE:                                 OPERATIVE REPORT   PREOPERATIVE DIAGNOSIS:  Tributary varicosities of the right leg.   POSTOPERATIVE DIAGNOSIS:  Tributary varicosities of the right leg.   PROCEDURE:  Removal of tributary varicosities with Trivex artery system.   ANESTHESIA:  General endotracheal.   COMPLICATIONS:  None.  The patient to the recovery room stable.   DESCRIPTION OF PROCEDURE:  The patient was taken to the operating room and  the area of the right groin and right leg were prepped and draped in the  usual sterile fashion.  The patient had preoperative marking of her  varicosities.  She had extensive tributary varicosities along the outer  aspect of her right thigh, extending into the popliteal area and posterior  calf.  She had had preoperative duplex imaging which showed no evidence of  saphenous vein or deep venous incompetence.  Using a small stab  incision  with the #11 blade, the tumescent solution was infiltrated under the level  of the tributary  varicosities for visualization.  Next using the cutter  head from the Trivex system, the veins were removed throughout the lateral  thigh and calf.  Once all veins were removed, the tumescent solution was  again infused for hemostasis with anesthesia.  The wounds were irrigated  with saline.  The stab incisions were closed with Benzoin and Steri-Strips.  The leg was elevated and was wrapped with Kerlix and then Coban for  compression.  The patient tolerated the procedure without any complications.  She was then  transferred to the recovery room in stable condition.                                               Larina Earthly, M.D.    TFE/MEDQ  D:  05/23/2002  T:  05/23/2002  Job:  [id number]

## 2010-11-08 NOTE — H&P (Signed)
Wanda Mckenzie, Wanda Mckenzie               ACCOUNT NO.:  [id number]   MEDICAL RECORD NO.:  [id number]          PATIENT TYPE:  EMS   LOCATION:  MAJO                         FACILITY:  [facility]   PHYSICIAN:  Michael L. Reynolds, M.D.DATE OF BIRTH:  1935-07-11   DATE OF ADMISSION:  12/02/2005  DATE OF DISCHARGE:                                HISTORY & PHYSICAL   REASON FOR EVALUATION:  Left arm numbness and confusion.   HISTORY OF PRESENT ILLNESS:  This is the initial stroke service admission  for this 75 year old woman with a past medical history which includes  hypertension, hypercholesterolemia, and a previous stroke in July, 2006.  Patient reports that she was driving home yesterday evening when she noticed  the sudden onset of numbness in her left upper extremity.  She did not  notice any associated face or lower extremity symptoms or any definite  weakness.  She came home, and her family noted that she was definitely  confused.  For example, she was taking off and putting on clothes, which is  unusual for her.  She tried to get dressed for an occasion which was already  over.  She was somewhat disoriented to time.  Her speech was a little bit  slurred.  She declined emergency room evaluation last night.  This morning  she woke up with a dull frontal headache, but her speech, confusion, and off  symptoms all seemed to be better.  She came to the [facility] emergency room  and had a CT of the head which demonstrated an acute stroke in the right  frontal area.  The patient denies any history of any previous similar  symptoms.  She has had no associated chest pain, palpitations, nausea,  vomiting, or loss of consciousness.  She does have some baseline shortness  of breath, but this was not necessarily worse yesterday.   PAST MEDICAL HISTORY:  She had a stroke in July, 2006.  She states that the  symptoms of this was numbness in all four extremities followed by loss of  vision, worse on the  left than on the right.  She was seen at [facility] in the emergency room but was discharged from there and followed up  with Dr. Lucas Mallow, who told her she had a stroke which resulted in loss of  peripheral vision, worse on the left than on the right.  She was then  referred to Dr. Daphane Shepherd, a neurologist in [facility], and apparently had an  outpatient workup for which we do not have any records.  She is treated for  hypertension, hyperlipidemia, and also has an irregular heartbeat, for  which she sees Dr. Myrtis Ser.  She has not been anticoagulated in the past, as  far as I can tell.   FAMILY HISTORY:  Remarkable for stroke in her father.   SOCIAL HISTORY:  She denies any history of tobacco or alcohol use.  She is  independent in her activities of daily living.   ALLERGIES:  CODEINE.   MEDICATIONS:  1.  Aspirin 81 mg daily.  2.  Plavix 75 mg  daily.  3.  Atenolol 25 mg daily.  4.  Zocor 80 mg daily.  5.  Antara 130 mg daily.  6.  Diltiazem XR 120 mg daily.  7.  HCTZ 12.5 mg daily.  8.  Isosorbide mononitrate 30 mg daily.  9.  Multivitamin daily.  10. Nitroglycerin p.r.n.   REVIEW OF SYSTEMS:  Remarkable for baseline dyspnea on exertion and  decreased visual fields, worse on the left than on the right.  Otherwise  full 10-system review of systems is negative except as outlined in the HPI  and in the admission nursing record.   PHYSICAL EXAMINATION:  VITAL SIGNS:  Temperature 97, blood pressure 166/81,  pulse 50, respirations 16.  GENERAL:  This is a healthy-appearing woman supine on the hospital bed in no  evidence of distress.  HEENT:  Cranium is normocephalic and atraumatic.  Oropharynx benign.  NECK:  Supple without carotid or supraclavicular bruit.  LUNGS:  Clear to auscultation bilaterally.  HEART:  Regular rate and rhythm without murmurs.  ABDOMEN:  Soft.  Normoactive bowel sounds.  No hepatomegaly.  EXTREMITIES:  Pulses 2+.  No edema.  NEUROLOGIC:  Mental status:   She is awake and alert, fully oriented.  Her  speech is fluent and not dysarthric.  She is able to name objects and repeat  phrases without difficulty.  Attention span, concentration, fund of  knowledge are all appropriate.  Mood is euthymic and affect appropriate.  Cranial nerves:  Pupils are equal and reactive.  Extraocular movements are  full without nystagmus.  Examination of visual fields reveals some neglect  in the left upper quadrant.  Hearing is intact to conversational speech.  Facial sensation is intact to pinprick.  Face, tongue, and palate move  normally and symmetrically.  Shoulder shrug strength is normal.  Motor  testing:  Normal bulk and tone.  She has minimal pyramidal weakness on the  left upper extremity, otherwise normal strength throughout.  Sensation:  She  reports diminished pinprick sensation in the upper and lower extremities  throughout and normal double simultaneous stimulation.  Cerebellar:  Rapid  movements are performed well.  Finger-to-nose, and heel-to-shin are  performed well.  Gait:  She rises easily from her chair, and stance is  normal.  She is able to ambulate without difficulty.  Reflexes are 2+ and  symmetric.  Toes are downgoing bilaterally.   LABORATORY REVIEW:  CBC:  White count 8.1, hemoglobin 13.9, platelets  342,000.  CMET is remarkable only for a minimally low potassium of 3.4.  Liver functions are normal.  BUN and creatinine are 19 and 1 respectively.  Glucose is 97.  Coags are normal.  Cardiac enzymes are negative.   CT of the head is personally reviewed and demonstrates an acute stroke in  the right posterior frontal area as well as an old stroke in the right PCA  territory.   IMPRESSION:  Acute right brain stroke with resolving symptoms.  Risk factors  include hypertension, hyperlipidemia, possible heart rhythm abnormality and  previous stroke.  RECOMMENDATIONS:  Will admit for workup, including MRI, MRA, carotid and  transcranial  Dopplers, 2D echocardiogram and stroke labs.  Will proceed with  physical, occupational, and speech therapy consults, although she is likely  to do quite well clinically.  Stroke service to follow.      Michael L. Thad Ranger, M.D.  Electronically Signed     MLR/MEDQ  D:  12/02/2005  T:  12/02/2005  Job:  [id number]   cc:  Delaney Meigs, M.D.  Fax: [phone]   Willa Rough, M.D.  [address]  [address]  [place]  [place] [address]

## 2010-11-08 NOTE — Assessment & Plan Note (Signed)
[facility]                          [facility]ICE NOTE   ANWITA, MENCER                      MRN:          [id number]  DATE:10/19/2006                            DOB:          05-09-1936    HISTORY OF PRESENT ILLNESS:  I saw Ms. Delton See last on September 17, 2006. At  that time, we arranged for her to have 24 hour urine to rule out  pheochromocytoma. Her catecholamines and Metanephrine's are normal.  Also, we put her Niaspan on hold. Since then, she has done well. She has  not had any episodes of unexplained significant hypotension. She returns  today and she has not had any chest discomfort.   She also brings me a nice list of her blood pressure's. On a daily  basis, her pressure ranges from 120/58, up to 155/80. I believe that she  is very stable with this and I am not going to make any other changes.   ALLERGIES:  CODEINE.   MEDICATIONS:  Imdur, Diltiazem, Maxair, Enterra, Simvastatin,  hydrochlorothiazide, and Coumadin.   PAST MEDICAL HISTORY:  See the list on my note of September 17, 2006.   REVIEW OF SYSTEMS:  She really is feeling well. She has mild peripheral  edema. Otherwise, her review of systems is negative.   PHYSICAL EXAMINATION:  VITAL SIGNS:  Blood pressure in our office is as  high as 160/75. With the multitude of pressure checks that I have from  her, I am not concerned about this systolic pressure at this time. Her  pulse is 50. Weight is 206 pounds.  GENERAL:  The patient is oriented to person, place, and time. Affect is  normal.  HEENT:  No xanthelasma. She has normal extraocular motion.  NECK:  There are no carotid bruits. There is no jugular venous  distention.  LUNGS:  Clear. Respiratory efforts is not labored.  CARDIAC:  Examination reveals an S1 and S2. There are no clicks or  significant murmurs.  ABDOMEN:  Soft. She has normal bowel sounds. She has 1+ peripheral  edema.   SUMMARY:  Ms. Schrodt is stable.  Her problems are listed on my note of  September 17, 2006. I plan to keep her on the current medications. She will  not take Niaspan for now. I will see her back in 3 months for cardiology  consult.     Luis Abed, MD, [facility]  Electronically Signed   JDK/MedQ  DD: 10/19/2006  DT: 10/19/2006  Job #: [id number]   cc:   Delaney Meigs, M.D.

## 2010-11-08 NOTE — Assessment & Plan Note (Signed)
[facility]                          [facility] OFFICE NOTE   Wanda Mckenzie, Wanda Mckenzie                      MRN:          [id number]  DATE:07/21/2006                            DOB:          May 28, 1936    [name] is here for cardiology follow up.  I had seen her, with a new  consultation, working with Rozell Searing, P.A.C., on June 08, 2006.  The patient had had a CNS event and ultimately atrial fibrillation was  not seen at that time.  It was felt that this was a non-hemorrhagic  right middle cerebral artery infarct, it was thought to be embolic,  possibly, and the patient was on aspirin and Plavix instead of Aggrenox.  She then wore a CardioNet since the last office visit.  Now we have seen  on her monitor that she has definite bursts of paroxysmal atrial  fibrillation.  With this in mind, she was contacted and she has been  carefully changed to Coumadin and she is quite stable.   She is feeling well in general.  She has had no syncope or presyncope.  She does still feel some episodes of increased heart rate.   PAST MEDICAL HISTORY:  ALLERGIES:  CODEINE.   Medications:  1. Enterra 130.  2. Hydrochlorothiazide 12.5.  3. Diltiazem XR 120 (to be increased to 240 daily).  4. Fish Oil.  5. Atenolol 25.  6. Simvastatin 80.  7. Niaspan 500.  8. Vitamin.  9. Coumadin.  10.Imdur 60.   OTHER MEDICAL PROBLEMS:  See the complete list on the note of June 08, 2006.   REVIEW OF SYSTEMS:  She is feeling well other than some mild  palpitations and her review of systems otherwise is negative.   PHYSICAL EXAM:  Blood pressure is 150/84 with a pulse of 72.  She has  shown me multiple blood pressures which have ranged from 135-160 at home  and pulse ranging from 55-75 at home.  Weight is 207 pounds today.  The  patient is oriented to person, time and place.  Affect is normal.  HEENT:  No xanthelasma.  She has normal extraocular motion.  There  are  no carotid bruits.  There is no jugular venous distention.  LUNGS:  Clear.  RESPIRATORY EFFORT:  Not labored.  CARDIAC:  An S1 with an S2.  There are no clicks or significant murmurs.  ABDOMEN:  Soft.  There are no masses or bruits.  She has no significant  peripheral edema.   The CardioNet monitor is discussed above.  Today in the office she is in  atrial fibrillation.  The rate is controlled today.   PROBLEMS:  1. History of an acute non-hemorrhagic right middle cerebral artery      infarct.  Her workup did not show an obvious cause in the hospital      but we now know that she has atrial fibrillation and she has been      treated with Coumadin.  2. Coumadin therapy.  3. Good left ventricular function.  4. Risk factors for coronary disease and she  is on the appropriate      medications.  5. Hypertension, better treated.  6. Hyperlipidemia, on the appropriate medications.  7. Paroxysmal atrial fibrillation.   Because she still has some mild symptoms, will increase her diltiazem  from 120 a day to 240 a day and then I will see her back.     Luis Abed, MD, [facility]  Electronically Signed    JDK/MedQ  DD: 07/21/2006  DT: 07/21/2006  Job #: [id number]

## 2010-11-19 ENCOUNTER — Ambulatory Visit (INDEPENDENT_AMBULATORY_CARE_PROVIDER_SITE_OTHER): Payer: Medicare Other | Admitting: *Deleted

## 2010-11-19 DIAGNOSIS — I6350 Cerebral infarction due to unspecified occlusion or stenosis of unspecified cerebral artery: Secondary | ICD-10-CM

## 2010-11-19 DIAGNOSIS — I4891 Unspecified atrial fibrillation: Secondary | ICD-10-CM

## 2010-11-19 DIAGNOSIS — Z7901 Long term (current) use of anticoagulants: Secondary | ICD-10-CM

## 2010-11-19 LAB — POCT INR: INR: 2.5

## 2010-11-27 ENCOUNTER — Encounter: Payer: Self-pay | Admitting: Cardiology

## 2010-12-17 ENCOUNTER — Ambulatory Visit (INDEPENDENT_AMBULATORY_CARE_PROVIDER_SITE_OTHER): Payer: Medicare Other | Admitting: *Deleted

## 2010-12-17 DIAGNOSIS — I6350 Cerebral infarction due to unspecified occlusion or stenosis of unspecified cerebral artery: Secondary | ICD-10-CM

## 2010-12-17 DIAGNOSIS — Z7901 Long term (current) use of anticoagulants: Secondary | ICD-10-CM

## 2010-12-17 DIAGNOSIS — I4891 Unspecified atrial fibrillation: Secondary | ICD-10-CM

## 2010-12-17 LAB — POCT INR: INR: 2.1

## 2011-01-14 ENCOUNTER — Ambulatory Visit (INDEPENDENT_AMBULATORY_CARE_PROVIDER_SITE_OTHER): Payer: Medicare Other | Admitting: *Deleted

## 2011-01-14 DIAGNOSIS — Z7901 Long term (current) use of anticoagulants: Secondary | ICD-10-CM

## 2011-01-14 DIAGNOSIS — I6350 Cerebral infarction due to unspecified occlusion or stenosis of unspecified cerebral artery: Secondary | ICD-10-CM

## 2011-01-14 DIAGNOSIS — I4891 Unspecified atrial fibrillation: Secondary | ICD-10-CM

## 2011-01-14 LAB — POCT INR: INR: 2.4

## 2011-02-11 ENCOUNTER — Ambulatory Visit (INDEPENDENT_AMBULATORY_CARE_PROVIDER_SITE_OTHER): Payer: Medicare Other | Admitting: *Deleted

## 2011-02-11 DIAGNOSIS — I4891 Unspecified atrial fibrillation: Secondary | ICD-10-CM

## 2011-02-11 DIAGNOSIS — Z7901 Long term (current) use of anticoagulants: Secondary | ICD-10-CM

## 2011-02-11 DIAGNOSIS — I6350 Cerebral infarction due to unspecified occlusion or stenosis of unspecified cerebral artery: Secondary | ICD-10-CM

## 2011-02-11 LAB — POCT INR: INR: 3

## 2011-02-20 ENCOUNTER — Telehealth: Payer: Self-pay | Admitting: *Deleted

## 2011-02-20 NOTE — Telephone Encounter (Signed)
Patient wants return phone call regarding instructions on Coumadin b/c she hasn't taken it in several days. / tg

## 2011-02-20 NOTE — Telephone Encounter (Signed)
Pt lost coumadin bottle and has not take coumadin since last Saturday.  Got refilled today.  Told to take coumadin 5mg  x 3 nights then resume regular dose.  Appt for INR check moved up to 9/11.  Pt verbalized understanding.

## 2011-03-04 ENCOUNTER — Ambulatory Visit (INDEPENDENT_AMBULATORY_CARE_PROVIDER_SITE_OTHER): Payer: Medicare Other | Admitting: *Deleted

## 2011-03-04 DIAGNOSIS — I6350 Cerebral infarction due to unspecified occlusion or stenosis of unspecified cerebral artery: Secondary | ICD-10-CM

## 2011-03-04 DIAGNOSIS — I4891 Unspecified atrial fibrillation: Secondary | ICD-10-CM

## 2011-03-04 DIAGNOSIS — Z7901 Long term (current) use of anticoagulants: Secondary | ICD-10-CM

## 2011-03-04 LAB — POCT INR: INR: 2.9

## 2011-03-04 MED ORDER — WARFARIN SODIUM 5 MG PO TABS: 5.0000 mg | ORAL_TABLET | ORAL | Status: DC

## 2011-03-11 ENCOUNTER — Encounter: Payer: Medicare Other | Admitting: *Deleted

## 2011-04-01 ENCOUNTER — Ambulatory Visit (INDEPENDENT_AMBULATORY_CARE_PROVIDER_SITE_OTHER): Payer: Medicare Other | Admitting: *Deleted

## 2011-04-01 DIAGNOSIS — Z7901 Long term (current) use of anticoagulants: Secondary | ICD-10-CM

## 2011-04-01 DIAGNOSIS — I4891 Unspecified atrial fibrillation: Secondary | ICD-10-CM

## 2011-04-01 DIAGNOSIS — I6350 Cerebral infarction due to unspecified occlusion or stenosis of unspecified cerebral artery: Secondary | ICD-10-CM

## 2011-04-01 LAB — POCT INR: INR: 2.3

## 2011-04-04 LAB — CBC
HCT: 29.8 — ABNORMAL LOW
Hemoglobin: 10.3 — ABNORMAL LOW
Platelets: 278
RDW: 13
WBC: 12.6 — ABNORMAL HIGH

## 2011-04-04 LAB — BASIC METABOLIC PANEL
Calcium: 8.4
GFR calc non Af Amer: 55 — ABNORMAL LOW
Potassium: 3.6
Sodium: 136

## 2011-04-04 LAB — PROTIME-INR
INR: 1.4
INR: 2.3 — ABNORMAL HIGH
INR: 2.5 — ABNORMAL HIGH
Prothrombin Time: 26 — ABNORMAL HIGH
Prothrombin Time: 27.8 — ABNORMAL HIGH
Prothrombin Time: 28.9 — ABNORMAL HIGH

## 2011-04-04 LAB — APTT
aPTT: 34
aPTT: 47 — ABNORMAL HIGH

## 2011-04-07 LAB — CBC
HCT: 32.7 — ABNORMAL LOW
HCT: 36.3
HCT: 41.1
Hemoglobin: 11.5 — ABNORMAL LOW
Hemoglobin: 14.4
MCHC: 34.6
MCHC: 35.2
MCV: 89.6
MCV: 90.6
MCV: 91.8
Platelets: 269
Platelets: 275
Platelets: 347
RBC: 4.31
RBC: 4.54
RDW: 13.2
RDW: 13.6
WBC: 13.6 — ABNORMAL HIGH
WBC: 14.9 — ABNORMAL HIGH

## 2011-04-07 LAB — BASIC METABOLIC PANEL
BUN: 13
BUN: 14
CO2: 26
Calcium: 8.2 — ABNORMAL LOW
Chloride: 105
Chloride: 106
GFR calc Af Amer: >60
GFR calc non Af Amer: 59 — ABNORMAL LOW
Glucose, Bld: 156 — ABNORMAL HIGH
Potassium: 3.5
Potassium: 3.8
Sodium: 137
Sodium: 141

## 2011-04-07 LAB — I-STAT 8, (EC8 V) (CONVERTED LAB)
BUN: 20
Chloride: 105
Glucose, Bld: 119 — ABNORMAL HIGH
HCT: 41
Hemoglobin: 13.9
Operator id: 277751
pCO2, Ven: 40.2 — ABNORMAL LOW

## 2011-04-07 LAB — CK TOTAL AND CKMB (NOT AT ARMC)
CK, MB: 1.6
Total CK: 155

## 2011-04-07 LAB — DIFFERENTIAL
Basophils Absolute: 0
Basophils Relative: 0
Eosinophils Absolute: 0.1
Monocytes Relative: 4
Neutrophils Relative %: 82 — ABNORMAL HIGH

## 2011-04-07 LAB — PROTIME-INR
INR: 1.3
INR: 2.3 — ABNORMAL HIGH

## 2011-04-07 LAB — APTT: aPTT: 33

## 2011-04-07 LAB — B-NATRIURETIC PEPTIDE (CONVERTED LAB): Pro B Natriuretic peptide (BNP): 105 — ABNORMAL HIGH

## 2011-04-29 ENCOUNTER — Ambulatory Visit (INDEPENDENT_AMBULATORY_CARE_PROVIDER_SITE_OTHER): Payer: Medicare Other | Admitting: *Deleted

## 2011-04-29 DIAGNOSIS — Z7901 Long term (current) use of anticoagulants: Secondary | ICD-10-CM

## 2011-04-29 DIAGNOSIS — I6350 Cerebral infarction due to unspecified occlusion or stenosis of unspecified cerebral artery: Secondary | ICD-10-CM

## 2011-04-29 DIAGNOSIS — I4891 Unspecified atrial fibrillation: Secondary | ICD-10-CM

## 2011-04-29 LAB — POCT INR: INR: 1.8

## 2011-05-27 ENCOUNTER — Ambulatory Visit (INDEPENDENT_AMBULATORY_CARE_PROVIDER_SITE_OTHER): Payer: Medicare Other | Admitting: *Deleted

## 2011-05-27 DIAGNOSIS — I4891 Unspecified atrial fibrillation: Secondary | ICD-10-CM

## 2011-05-27 DIAGNOSIS — I6350 Cerebral infarction due to unspecified occlusion or stenosis of unspecified cerebral artery: Secondary | ICD-10-CM

## 2011-05-27 DIAGNOSIS — Z7901 Long term (current) use of anticoagulants: Secondary | ICD-10-CM

## 2011-05-29 ENCOUNTER — Encounter: Payer: Self-pay | Admitting: *Deleted

## 2011-06-02 ENCOUNTER — Ambulatory Visit: Payer: Medicare Other | Admitting: Cardiology

## 2011-06-27 ENCOUNTER — Ambulatory Visit (INDEPENDENT_AMBULATORY_CARE_PROVIDER_SITE_OTHER): Payer: Medicare Other | Admitting: *Deleted

## 2011-06-27 DIAGNOSIS — Z7901 Long term (current) use of anticoagulants: Secondary | ICD-10-CM

## 2011-06-27 DIAGNOSIS — I6350 Cerebral infarction due to unspecified occlusion or stenosis of unspecified cerebral artery: Secondary | ICD-10-CM

## 2011-06-27 DIAGNOSIS — I4891 Unspecified atrial fibrillation: Secondary | ICD-10-CM

## 2011-06-29 ENCOUNTER — Encounter: Payer: Self-pay | Admitting: Cardiology

## 2011-06-29 DIAGNOSIS — I482 Chronic atrial fibrillation, unspecified: Secondary | ICD-10-CM | POA: Insufficient documentation

## 2011-06-29 DIAGNOSIS — R232 Flushing: Secondary | ICD-10-CM | POA: Insufficient documentation

## 2011-06-29 DIAGNOSIS — E785 Hyperlipidemia, unspecified: Secondary | ICD-10-CM | POA: Insufficient documentation

## 2011-06-29 DIAGNOSIS — Z8673 Personal history of transient ischemic attack (TIA), and cerebral infarction without residual deficits: Secondary | ICD-10-CM | POA: Insufficient documentation

## 2011-06-29 DIAGNOSIS — J449 Chronic obstructive pulmonary disease, unspecified: Secondary | ICD-10-CM | POA: Insufficient documentation

## 2011-06-29 DIAGNOSIS — R079 Chest pain, unspecified: Secondary | ICD-10-CM | POA: Insufficient documentation

## 2011-06-29 DIAGNOSIS — I959 Hypotension, unspecified: Secondary | ICD-10-CM | POA: Insufficient documentation

## 2011-06-29 DIAGNOSIS — Z7901 Long term (current) use of anticoagulants: Secondary | ICD-10-CM | POA: Insufficient documentation

## 2011-06-29 DIAGNOSIS — I351 Nonrheumatic aortic (valve) insufficiency: Secondary | ICD-10-CM | POA: Insufficient documentation

## 2011-06-29 DIAGNOSIS — R9430 Abnormal result of cardiovascular function study, unspecified: Secondary | ICD-10-CM | POA: Insufficient documentation

## 2011-06-29 DIAGNOSIS — IMO0002 Reserved for concepts with insufficient information to code with codable children: Secondary | ICD-10-CM | POA: Insufficient documentation

## 2011-06-29 DIAGNOSIS — R0989 Other specified symptoms and signs involving the circulatory and respiratory systems: Secondary | ICD-10-CM | POA: Insufficient documentation

## 2011-06-30 ENCOUNTER — Encounter: Payer: Self-pay | Admitting: Cardiology

## 2011-06-30 ENCOUNTER — Ambulatory Visit (INDEPENDENT_AMBULATORY_CARE_PROVIDER_SITE_OTHER): Payer: Medicare Other | Admitting: Cardiology

## 2011-06-30 VITALS — BP 184/94 | HR 71 | Ht 70.0 in | Wt 202.0 lb

## 2011-06-30 DIAGNOSIS — I4891 Unspecified atrial fibrillation: Secondary | ICD-10-CM

## 2011-06-30 DIAGNOSIS — I10 Essential (primary) hypertension: Secondary | ICD-10-CM | POA: Insufficient documentation

## 2011-06-30 DIAGNOSIS — Z7901 Long term (current) use of anticoagulants: Secondary | ICD-10-CM

## 2011-06-30 DIAGNOSIS — I351 Nonrheumatic aortic (valve) insufficiency: Secondary | ICD-10-CM

## 2011-06-30 DIAGNOSIS — I359 Nonrheumatic aortic valve disorder, unspecified: Secondary | ICD-10-CM

## 2011-06-30 NOTE — Patient Instructions (Signed)
Your physician you to follow up in 1 year. You will receive a reminder letter in the mail one-two months in advance. If you don't receive a letter, please call our office to schedule the follow-up appointment. Your physician recommends that you continue on your current medications as directed. Please refer to the Current Medication list given to you today. 

## 2011-06-30 NOTE — Progress Notes (Signed)
HPI   Patient is seen to followup atrial fibrillation. I saw her last December, 2011. She has done well since then. She does not feel any significant palpitations. She says that her blood pressure has been normal at home. Today it is elevated.  As part of today's evaluation I have reviewed the patient's old records completely and I have completely updated the new electronic medical record.  Allergies  Allergen Reactions  . Codeine     REACTION: vomiting    Current Outpatient Prescriptions  Medication Sig Dispense Refill  . atenolol (TENORMIN) 25 MG tablet Take 25 mg by mouth daily.        Marland Kitchen atorvastatin (LIPITOR) 80 MG tablet Take 80 mg by mouth daily.        . calcium carbonate (OS-CAL) 600 MG TABS Take 600 mg by mouth 2 (two) times daily with a meal.        . diltiazem (CARDIZEM) 120 MG tablet Take 120 mg by mouth 2 (two) times daily.        . fenofibrate 160 MG tablet Take 160 mg by mouth daily.        . Folic Acid-Vit B6-Vit B12 (FOLBEE) 2.5-25-1 MG TABS Take 1 tablet by mouth daily.        . hydrochlorothiazide (HYDRODIURIL) 25 MG tablet Take 12.5 mg by mouth daily.        . isosorbide mononitrate (IMDUR) 30 MG 24 hr tablet Take 30 mg by mouth daily.        . Multiple Vitamin (MULTIVITAMIN) tablet Take 1 tablet by mouth daily.        . vitamin B-12 (CYANOCOBALAMIN) 250 MCG tablet Take 250 mcg by mouth daily.        Marland Kitchen warfarin (COUMADIN) 5 MG tablet Take 1 tablet (5 mg total) by mouth as directed.  30 tablet  0    History   Social History  . Marital Status: Married    Spouse Name: N/A    Number of Children: N/A  . Years of Education: N/A   Occupational History  . Not on file.   Social History Main Topics  . Smoking status: Never Smoker   . Smokeless tobacco: Never Used  . Alcohol Use: Not on file  . Drug Use: Not on file  . Sexually Active: Not on file   Other Topics Concern  . Not on file   Social History Narrative  . No narrative on file    Family History    Problem Relation Age of Onset  . Stroke Father     Past Medical History  Diagnosis Date  . Chest pain     Cardiolyte...05/2005...borderline abnormalities/Carotid doppler....01/2005...no abnormality..  . Dyslipidemia   . CVA (cerebral infarction)     old Right middle cerebral artery infarct ..probably from atrial fib....no PFO by TEE then  . Aortic insufficiency     mild...echo....11/2005/EF  55-60%  ..echo..11/2005..   . Flushing     niaspan  . Hypotension     unexplained episode in the past...pheo ruled out.  Marland Kitchen COPD (chronic obstructive pulmonary disease)     by CXray  . Campath-induced atrial fibrillation     Coumadin therapy  . Ejection fraction     EF 55-60%, echo, 2007  . Carotid bruit     Doppler, 2006, no abnormality  . Warfarin anticoagulation     Past Surgical History  Procedure Date  . 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 Left hip hemiarthroplasty  ROS   Patient denies fever, chills, headache, sweats, rash, change in vision, change in hearing, chest pain, cough, nausea vomiting, urinary symptoms. All other systems are reviewed and are negative.  PHYSICAL EXAM   Patient is oriented to person time and place. Affect is normal. Lungs are clear. Respiratory effort is nonlabored. There is no jugulovenous distention. Cardiac exam reveals S1 and S2. I do not hear any significant murmurs. Her abdomen is soft. There is no peripheral edema. There no musculoskeletal deformities. There are no skin rashes.  Filed Vitals:   06/30/11 0852 06/30/11 0853  BP: 184/85 184/94  Pulse: 67 71  Height: 5\' 10"  (1.778 m)   Weight: 202 lb (91.627 kg)     EKG   EKG is done today and reviewed by me. There is atrial fibrillation with a controlled ventricular response.There is no significant change. ASSESSMENT & PLAN

## 2011-06-30 NOTE — Assessment & Plan Note (Signed)
[name] Coumadin is to be continued.

## 2011-06-30 NOTE — Assessment & Plan Note (Signed)
Aortic insufficiency continues to be mild by her echo. No further workup.

## 2011-06-30 NOTE — Assessment & Plan Note (Signed)
[name] atrial fibrillation rate is controlled. She is on Coumadin. No change in therapy.

## 2011-06-30 NOTE — Assessment & Plan Note (Signed)
The patient says that her blood pressure has been under good control at home. It is definitely elevated here today. I've asked her to keep a log of her blood pressure by taking it daily. She will then take it to her primary physician when she sees him later this month. At that time decisions can be made about adding any additional medications. One option would be to change her diltiazem to amlodipine. She is on only a small dose of diltiazem and has good heart rate control. Changing her to amlodipine would be reasonable with followup of her heart rate. Another option would be to continue her current medications for rate control and add a different class of medicines such as a ACE inhibitor or ARB.

## 2011-07-15 ENCOUNTER — Ambulatory Visit (INDEPENDENT_AMBULATORY_CARE_PROVIDER_SITE_OTHER): Payer: Medicare Other | Admitting: *Deleted

## 2011-07-15 DIAGNOSIS — I639 Cerebral infarction, unspecified: Secondary | ICD-10-CM

## 2011-07-15 DIAGNOSIS — Z7901 Long term (current) use of anticoagulants: Secondary | ICD-10-CM

## 2011-07-15 DIAGNOSIS — I4891 Unspecified atrial fibrillation: Secondary | ICD-10-CM

## 2011-07-15 DIAGNOSIS — I6350 Cerebral infarction due to unspecified occlusion or stenosis of unspecified cerebral artery: Secondary | ICD-10-CM

## 2011-07-15 LAB — POCT INR: INR: 2.2

## 2011-08-12 ENCOUNTER — Ambulatory Visit (INDEPENDENT_AMBULATORY_CARE_PROVIDER_SITE_OTHER): Payer: Medicare Other | Admitting: *Deleted

## 2011-08-12 DIAGNOSIS — I4891 Unspecified atrial fibrillation: Secondary | ICD-10-CM

## 2011-08-12 DIAGNOSIS — Z7901 Long term (current) use of anticoagulants: Secondary | ICD-10-CM

## 2011-08-12 DIAGNOSIS — I639 Cerebral infarction, unspecified: Secondary | ICD-10-CM

## 2011-08-12 DIAGNOSIS — I6350 Cerebral infarction due to unspecified occlusion or stenosis of unspecified cerebral artery: Secondary | ICD-10-CM

## 2011-09-09 ENCOUNTER — Ambulatory Visit (INDEPENDENT_AMBULATORY_CARE_PROVIDER_SITE_OTHER): Payer: Medicare Other | Admitting: *Deleted

## 2011-09-09 DIAGNOSIS — I639 Cerebral infarction, unspecified: Secondary | ICD-10-CM

## 2011-09-09 DIAGNOSIS — I6350 Cerebral infarction due to unspecified occlusion or stenosis of unspecified cerebral artery: Secondary | ICD-10-CM

## 2011-09-09 DIAGNOSIS — I4891 Unspecified atrial fibrillation: Secondary | ICD-10-CM

## 2011-09-09 DIAGNOSIS — Z7901 Long term (current) use of anticoagulants: Secondary | ICD-10-CM

## 2011-10-21 ENCOUNTER — Ambulatory Visit (INDEPENDENT_AMBULATORY_CARE_PROVIDER_SITE_OTHER): Payer: Medicare Other | Admitting: *Deleted

## 2011-10-21 DIAGNOSIS — I6350 Cerebral infarction due to unspecified occlusion or stenosis of unspecified cerebral artery: Secondary | ICD-10-CM

## 2011-10-21 DIAGNOSIS — I639 Cerebral infarction, unspecified: Secondary | ICD-10-CM

## 2011-10-21 DIAGNOSIS — Z7901 Long term (current) use of anticoagulants: Secondary | ICD-10-CM

## 2011-10-21 DIAGNOSIS — I4891 Unspecified atrial fibrillation: Secondary | ICD-10-CM

## 2011-12-02 ENCOUNTER — Ambulatory Visit (INDEPENDENT_AMBULATORY_CARE_PROVIDER_SITE_OTHER): Payer: Medicare Other | Admitting: *Deleted

## 2011-12-02 DIAGNOSIS — I6350 Cerebral infarction due to unspecified occlusion or stenosis of unspecified cerebral artery: Secondary | ICD-10-CM

## 2011-12-02 DIAGNOSIS — I639 Cerebral infarction, unspecified: Secondary | ICD-10-CM

## 2011-12-02 DIAGNOSIS — I4891 Unspecified atrial fibrillation: Secondary | ICD-10-CM

## 2011-12-02 DIAGNOSIS — Z7901 Long term (current) use of anticoagulants: Secondary | ICD-10-CM

## 2011-12-02 LAB — POCT INR: INR: 2

## 2012-01-13 ENCOUNTER — Ambulatory Visit (INDEPENDENT_AMBULATORY_CARE_PROVIDER_SITE_OTHER): Payer: Medicare Other | Admitting: *Deleted

## 2012-01-13 DIAGNOSIS — I4891 Unspecified atrial fibrillation: Secondary | ICD-10-CM

## 2012-01-13 DIAGNOSIS — Z7901 Long term (current) use of anticoagulants: Secondary | ICD-10-CM

## 2012-01-13 DIAGNOSIS — I639 Cerebral infarction, unspecified: Secondary | ICD-10-CM

## 2012-01-13 DIAGNOSIS — I6350 Cerebral infarction due to unspecified occlusion or stenosis of unspecified cerebral artery: Secondary | ICD-10-CM

## 2012-02-10 ENCOUNTER — Ambulatory Visit (INDEPENDENT_AMBULATORY_CARE_PROVIDER_SITE_OTHER): Payer: Medicare Other | Admitting: *Deleted

## 2012-02-10 DIAGNOSIS — I639 Cerebral infarction, unspecified: Secondary | ICD-10-CM

## 2012-02-10 DIAGNOSIS — Z7901 Long term (current) use of anticoagulants: Secondary | ICD-10-CM

## 2012-02-10 DIAGNOSIS — I6350 Cerebral infarction due to unspecified occlusion or stenosis of unspecified cerebral artery: Secondary | ICD-10-CM

## 2012-02-10 DIAGNOSIS — I4891 Unspecified atrial fibrillation: Secondary | ICD-10-CM

## 2012-02-10 LAB — POCT INR: INR: 2.8

## 2012-03-09 ENCOUNTER — Ambulatory Visit (INDEPENDENT_AMBULATORY_CARE_PROVIDER_SITE_OTHER): Payer: Medicare Other | Admitting: *Deleted

## 2012-03-09 DIAGNOSIS — I639 Cerebral infarction, unspecified: Secondary | ICD-10-CM

## 2012-03-09 DIAGNOSIS — I6350 Cerebral infarction due to unspecified occlusion or stenosis of unspecified cerebral artery: Secondary | ICD-10-CM

## 2012-03-09 DIAGNOSIS — I4891 Unspecified atrial fibrillation: Secondary | ICD-10-CM

## 2012-03-09 DIAGNOSIS — Z7901 Long term (current) use of anticoagulants: Secondary | ICD-10-CM

## 2012-03-09 LAB — POCT INR: INR: 3.3

## 2012-04-13 ENCOUNTER — Ambulatory Visit (INDEPENDENT_AMBULATORY_CARE_PROVIDER_SITE_OTHER): Payer: Medicare Other | Admitting: *Deleted

## 2012-04-13 DIAGNOSIS — I6350 Cerebral infarction due to unspecified occlusion or stenosis of unspecified cerebral artery: Secondary | ICD-10-CM

## 2012-04-13 DIAGNOSIS — I4891 Unspecified atrial fibrillation: Secondary | ICD-10-CM

## 2012-04-13 DIAGNOSIS — I639 Cerebral infarction, unspecified: Secondary | ICD-10-CM

## 2012-04-13 DIAGNOSIS — Z7901 Long term (current) use of anticoagulants: Secondary | ICD-10-CM

## 2012-04-13 LAB — POCT INR: INR: 2.6

## 2012-05-11 ENCOUNTER — Ambulatory Visit (INDEPENDENT_AMBULATORY_CARE_PROVIDER_SITE_OTHER): Payer: Medicare Other | Admitting: *Deleted

## 2012-05-11 DIAGNOSIS — I6350 Cerebral infarction due to unspecified occlusion or stenosis of unspecified cerebral artery: Secondary | ICD-10-CM

## 2012-05-11 DIAGNOSIS — Z7901 Long term (current) use of anticoagulants: Secondary | ICD-10-CM

## 2012-05-11 DIAGNOSIS — I4891 Unspecified atrial fibrillation: Secondary | ICD-10-CM

## 2012-05-11 DIAGNOSIS — I639 Cerebral infarction, unspecified: Secondary | ICD-10-CM

## 2012-06-08 ENCOUNTER — Ambulatory Visit (INDEPENDENT_AMBULATORY_CARE_PROVIDER_SITE_OTHER): Payer: Medicare Other | Admitting: *Deleted

## 2012-06-08 DIAGNOSIS — I639 Cerebral infarction, unspecified: Secondary | ICD-10-CM

## 2012-06-08 DIAGNOSIS — Z7901 Long term (current) use of anticoagulants: Secondary | ICD-10-CM

## 2012-06-08 DIAGNOSIS — I4891 Unspecified atrial fibrillation: Secondary | ICD-10-CM

## 2012-06-08 DIAGNOSIS — I6350 Cerebral infarction due to unspecified occlusion or stenosis of unspecified cerebral artery: Secondary | ICD-10-CM

## 2012-07-16 ENCOUNTER — Encounter: Payer: Self-pay | Admitting: Cardiology

## 2012-07-16 ENCOUNTER — Ambulatory Visit (INDEPENDENT_AMBULATORY_CARE_PROVIDER_SITE_OTHER): Payer: Medicare Other | Admitting: Cardiology

## 2012-07-16 ENCOUNTER — Ambulatory Visit (INDEPENDENT_AMBULATORY_CARE_PROVIDER_SITE_OTHER): Payer: Medicare Other | Admitting: *Deleted

## 2012-07-16 VITALS — BP 202/87 | HR 63 | Ht 70.0 in | Wt 200.0 lb

## 2012-07-16 DIAGNOSIS — I359 Nonrheumatic aortic valve disorder, unspecified: Secondary | ICD-10-CM

## 2012-07-16 DIAGNOSIS — Z7901 Long term (current) use of anticoagulants: Secondary | ICD-10-CM

## 2012-07-16 DIAGNOSIS — E785 Hyperlipidemia, unspecified: Secondary | ICD-10-CM

## 2012-07-16 DIAGNOSIS — I6350 Cerebral infarction due to unspecified occlusion or stenosis of unspecified cerebral artery: Secondary | ICD-10-CM

## 2012-07-16 DIAGNOSIS — R079 Chest pain, unspecified: Secondary | ICD-10-CM

## 2012-07-16 DIAGNOSIS — I351 Nonrheumatic aortic (valve) insufficiency: Secondary | ICD-10-CM

## 2012-07-16 DIAGNOSIS — I4891 Unspecified atrial fibrillation: Secondary | ICD-10-CM

## 2012-07-16 DIAGNOSIS — I10 Essential (primary) hypertension: Secondary | ICD-10-CM

## 2012-07-16 DIAGNOSIS — I639 Cerebral infarction, unspecified: Secondary | ICD-10-CM

## 2012-07-16 LAB — POCT INR: INR: 2.1

## 2012-07-16 NOTE — Assessment & Plan Note (Signed)
Lipids are being treated. No change in therapy. 

## 2012-07-16 NOTE — Patient Instructions (Addendum)
Your physician recommends that you schedule a follow-up appointment in: 1 year. You will receive a reminder letter in the mail in about 10 months reminding you to call and schedule your appointment. If you don't receive this letter, please contact our office. Your physician recommends that you continue on your current medications as directed. Please refer to the Current Medication list given to you today.  Your physician has requested that you regularly monitor and record your blood pressure readings at home for 1 week. Please use the same machine at various times of day to check your readings and record them. Please send results to our office for review.

## 2012-07-16 NOTE — Assessment & Plan Note (Signed)
Atrial fibrillation is chronic. The rate is controlled and she is on Coumadin. No change in therapy.

## 2012-07-16 NOTE — Assessment & Plan Note (Signed)
There was very mild aortic insufficiency by her followup echo in December, 2011. No echo is needed at this time.

## 2012-07-16 NOTE — Progress Notes (Signed)
HPI  Patient is seen for cardiology followup. I saw her last June 30, 2011. She is feeling well. She has not yet taken her blood pressure medicines today. She does have some increased stress at home. Her blood pressure is definitely elevated here today. Her pressure was elevated last year. She tells me that is normal at home. She mentions that she was also at her primary care physician recently and thinks her pressure was okay.  She has chronic atrial fibrillation. The rate is controlled. She's on Coumadin and doing well. She's not had any significant symptoms.  Allergies  Allergen Reactions  . Codeine     REACTION: vomiting    Current Outpatient Prescriptions  Medication Sig Dispense Refill  . alendronate (FOSAMAX) 70 MG tablet Take 70 mg by mouth every 7 (seven) days. Take with a full glass of water on an empty stomach.      Marland Kitchen atenolol (TENORMIN) 25 MG tablet Take 25 mg by mouth daily.        Marland Kitchen atorvastatin (LIPITOR) 80 MG tablet Take 80 mg by mouth daily.        . calcium carbonate (OS-CAL) 600 MG TABS Take 600 mg by mouth 2 (two) times daily with a meal.        . diltiazem (CARDIZEM) 120 MG tablet Take 120 mg by mouth 2 (two) times daily.        . fenofibrate 160 MG tablet Take 160 mg by mouth daily.        . fish oil-omega-3 fatty acids 1000 MG capsule Take 2 g by mouth 2 (two) times daily.      . Folic Acid-Vit B6-Vit B12 (FOLBEE) 2.5-25-1 MG TABS Take 1 tablet by mouth daily.        . hydrochlorothiazide (HYDRODIURIL) 25 MG tablet Take 12.5 mg by mouth daily.        . isosorbide mononitrate (IMDUR) 30 MG 24 hr tablet Take 30 mg by mouth daily.        . Multiple Vitamin (MULTIVITAMIN) tablet Take 1 tablet by mouth daily.        . vitamin B-12 (CYANOCOBALAMIN) 250 MCG tablet Take 250 mcg by mouth daily.        Marland Kitchen warfarin (COUMADIN) 5 MG tablet Take 1 tablet (5 mg total) by mouth as directed.  30 tablet  0    History   Social History  . Marital Status: Married    Spouse  Name: N/A    Number of Children: N/A  . Years of Education: N/A   Occupational History  . Not on file.   Social History Main Topics  . Smoking status: Never Smoker   . Smokeless tobacco: Never Used  . Alcohol Use: Not on file  . Drug Use: Not on file  . Sexually Active: Not on file   Other Topics Concern  . Not on file   Social History Narrative  . No narrative on file    Family History  Problem Relation Age of Onset  . Stroke Father     Past Medical History  Diagnosis Date  . Chest pain     Cardiolyte...05/2005...borderline abnormalities/Carotid doppler....01/2005...no abnormality..  . Dyslipidemia   . CVA (cerebral infarction)     old Right middle cerebral artery infarct ..probably from atrial fib....no PFO by TEE then  . Aortic insufficiency     mild...echo....11/2005/EF  55-60%  ..echo..11/2005..   . Flushing     niaspan  . Hypotension  unexplained episode in the past...pheo ruled out.  Marland Kitchen COPD (chronic obstructive pulmonary disease)     by CXray  . Atrial fibrillation     Coumadin therapy  . Ejection fraction     EF 55-60%, echo, 2007  . Carotid bruit     Doppler, 2006, no abnormality  . Warfarin anticoagulation   . Hypertension     Past Surgical History  Procedure Date  . 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 Left hip hemiarthroplasty     Patient Active Problem List  Diagnosis  . Chest pain  . Dyslipidemia  . CVA (cerebral infarction)  . Aortic insufficiency  . Flushing  . Hypotension  . COPD (chronic obstructive pulmonary disease)  . Atrial fibrillation  . Ejection fraction  . Carotid bruit  . Warfarin anticoagulation  . Hypertension    ROS   Patient denies fever, chills, headache, sweats, rash, change in vision, change in hearing, chest pain, cough, nausea vomiting, urinary symptoms. All other systems are reviewed and are negative.  PHYSICAL EXAM  Patient is oriented to person time and place. Affect is normal. Lungs are  clear. Respiratory effort is nonlabored. Cardiac exam reveals S1 and S2. There no clicks or significant murmurs. The rhythm is irregularly irregular. The abdomen is soft. There's no peripheral edema. There are no musculoskeletal deformities. There are no skin rashes.  Filed Vitals:   07/16/12 1020 07/16/12 1026 07/16/12 1027  BP: 192/81 192/81 202/87  Pulse: 63    Height: 5\' 10"  (1.778 m)    Weight: 200 lb (90.719 kg)     EKG is done today and reviewed by me. She has atrial fib that is old. She has nonspecific ST-T wave changes that are old. There are small inferior Q waves. There is no change from the prior tracing of January, 2013.  ASSESSMENT & PLAN

## 2012-07-16 NOTE — Assessment & Plan Note (Signed)
No recurrent chest pain. No further workup.

## 2012-07-16 NOTE — Assessment & Plan Note (Signed)
I continue to be concerned about her blood pressure. She may well have significant white coat hypertension. However we have asked her to formally check her blood pressure at home over the next several weeks and report back to [place].

## 2012-07-22 ENCOUNTER — Telehealth: Payer: Self-pay | Admitting: *Deleted

## 2012-07-22 NOTE — Telephone Encounter (Signed)
07/16/12 @4 :10 pm 173/83 HR 71 07/17/12 @8 :30 am 148/70 HR 56 07/18/12 @5 :00 pm 162/69 HR 57 07/19/12 @1 :00 pm 149/74 HR 84 07/20/12 @10 :00 am 181/88 HR 74 07/21/12 @1 :00 pm 136/67 HR 57 07/22/12 @10 :30 am 146/74 HR 71

## 2012-07-23 ENCOUNTER — Encounter: Payer: Self-pay | Admitting: Cardiology

## 2012-07-23 NOTE — Telephone Encounter (Signed)
Her BP is OK for her at home. No change in meds now.

## 2012-07-23 NOTE — Progress Notes (Signed)
   She called in BPs. They are reasonable at home. No change in RX now.

## 2012-07-26 NOTE — Telephone Encounter (Signed)
Patient informed. 

## 2012-09-07 ENCOUNTER — Ambulatory Visit (INDEPENDENT_AMBULATORY_CARE_PROVIDER_SITE_OTHER): Payer: Medicare Other | Admitting: *Deleted

## 2012-09-07 DIAGNOSIS — I639 Cerebral infarction, unspecified: Secondary | ICD-10-CM

## 2012-10-19 ENCOUNTER — Ambulatory Visit (INDEPENDENT_AMBULATORY_CARE_PROVIDER_SITE_OTHER): Payer: Medicare Other | Admitting: *Deleted

## 2012-10-19 DIAGNOSIS — I6350 Cerebral infarction due to unspecified occlusion or stenosis of unspecified cerebral artery: Secondary | ICD-10-CM

## 2012-10-19 DIAGNOSIS — I639 Cerebral infarction, unspecified: Secondary | ICD-10-CM

## 2012-10-19 DIAGNOSIS — I4891 Unspecified atrial fibrillation: Secondary | ICD-10-CM

## 2012-10-19 DIAGNOSIS — Z7901 Long term (current) use of anticoagulants: Secondary | ICD-10-CM

## 2012-11-16 ENCOUNTER — Ambulatory Visit (INDEPENDENT_AMBULATORY_CARE_PROVIDER_SITE_OTHER): Payer: Medicare Other | Admitting: *Deleted

## 2012-11-16 DIAGNOSIS — I6350 Cerebral infarction due to unspecified occlusion or stenosis of unspecified cerebral artery: Secondary | ICD-10-CM

## 2012-11-16 DIAGNOSIS — I4891 Unspecified atrial fibrillation: Secondary | ICD-10-CM

## 2012-11-16 DIAGNOSIS — I639 Cerebral infarction, unspecified: Secondary | ICD-10-CM

## 2012-11-16 DIAGNOSIS — Z7901 Long term (current) use of anticoagulants: Secondary | ICD-10-CM

## 2012-11-16 LAB — POCT INR: INR: 5.8

## 2012-11-19 ENCOUNTER — Ambulatory Visit (INDEPENDENT_AMBULATORY_CARE_PROVIDER_SITE_OTHER): Payer: Medicare Other | Admitting: *Deleted

## 2012-11-19 DIAGNOSIS — I6350 Cerebral infarction due to unspecified occlusion or stenosis of unspecified cerebral artery: Secondary | ICD-10-CM

## 2012-11-19 DIAGNOSIS — I639 Cerebral infarction, unspecified: Secondary | ICD-10-CM

## 2012-11-19 DIAGNOSIS — Z7901 Long term (current) use of anticoagulants: Secondary | ICD-10-CM

## 2012-11-19 DIAGNOSIS — I4891 Unspecified atrial fibrillation: Secondary | ICD-10-CM

## 2012-11-19 LAB — POCT INR: INR: 1.7

## 2012-11-26 ENCOUNTER — Ambulatory Visit (INDEPENDENT_AMBULATORY_CARE_PROVIDER_SITE_OTHER): Payer: Medicare Other | Admitting: *Deleted

## 2012-11-26 DIAGNOSIS — I6350 Cerebral infarction due to unspecified occlusion or stenosis of unspecified cerebral artery: Secondary | ICD-10-CM

## 2012-11-26 DIAGNOSIS — Z7901 Long term (current) use of anticoagulants: Secondary | ICD-10-CM

## 2012-11-26 DIAGNOSIS — I4891 Unspecified atrial fibrillation: Secondary | ICD-10-CM

## 2012-11-26 DIAGNOSIS — I639 Cerebral infarction, unspecified: Secondary | ICD-10-CM

## 2012-11-26 LAB — POCT INR: INR: 1.6

## 2012-12-10 ENCOUNTER — Ambulatory Visit (INDEPENDENT_AMBULATORY_CARE_PROVIDER_SITE_OTHER): Payer: Medicare Other | Admitting: *Deleted

## 2012-12-10 DIAGNOSIS — I639 Cerebral infarction, unspecified: Secondary | ICD-10-CM

## 2012-12-10 DIAGNOSIS — I4891 Unspecified atrial fibrillation: Secondary | ICD-10-CM

## 2012-12-10 DIAGNOSIS — Z7901 Long term (current) use of anticoagulants: Secondary | ICD-10-CM

## 2012-12-10 DIAGNOSIS — I6350 Cerebral infarction due to unspecified occlusion or stenosis of unspecified cerebral artery: Secondary | ICD-10-CM

## 2012-12-10 LAB — POCT INR: INR: 4

## 2012-12-28 ENCOUNTER — Ambulatory Visit (INDEPENDENT_AMBULATORY_CARE_PROVIDER_SITE_OTHER): Payer: Medicare Other | Admitting: *Deleted

## 2012-12-28 DIAGNOSIS — I639 Cerebral infarction, unspecified: Secondary | ICD-10-CM

## 2012-12-28 DIAGNOSIS — I6350 Cerebral infarction due to unspecified occlusion or stenosis of unspecified cerebral artery: Secondary | ICD-10-CM

## 2012-12-28 DIAGNOSIS — Z7901 Long term (current) use of anticoagulants: Secondary | ICD-10-CM

## 2012-12-28 DIAGNOSIS — I4891 Unspecified atrial fibrillation: Secondary | ICD-10-CM

## 2012-12-28 LAB — POCT INR: INR: 3.1

## 2013-01-12 ENCOUNTER — Other Ambulatory Visit: Payer: Self-pay | Admitting: *Deleted

## 2013-01-12 DIAGNOSIS — I83893 Varicose veins of bilateral lower extremities with other complications: Secondary | ICD-10-CM

## 2013-01-18 ENCOUNTER — Ambulatory Visit (INDEPENDENT_AMBULATORY_CARE_PROVIDER_SITE_OTHER): Payer: Self-pay | Admitting: *Deleted

## 2013-01-18 DIAGNOSIS — Z7901 Long term (current) use of anticoagulants: Secondary | ICD-10-CM

## 2013-01-18 DIAGNOSIS — I639 Cerebral infarction, unspecified: Secondary | ICD-10-CM

## 2013-01-18 DIAGNOSIS — I4891 Unspecified atrial fibrillation: Secondary | ICD-10-CM

## 2013-01-18 DIAGNOSIS — I6350 Cerebral infarction due to unspecified occlusion or stenosis of unspecified cerebral artery: Secondary | ICD-10-CM

## 2013-01-18 LAB — POCT INR: INR: 2

## 2013-02-01 ENCOUNTER — Encounter: Payer: Self-pay | Admitting: Vascular Surgery

## 2013-02-02 ENCOUNTER — Encounter: Payer: Self-pay | Admitting: Vascular Surgery

## 2013-02-02 ENCOUNTER — Encounter: Payer: Self-pay | Admitting: *Deleted

## 2013-02-02 ENCOUNTER — Ambulatory Visit (INDEPENDENT_AMBULATORY_CARE_PROVIDER_SITE_OTHER): Payer: Medicare Other | Admitting: Vascular Surgery

## 2013-02-02 ENCOUNTER — Encounter (INDEPENDENT_AMBULATORY_CARE_PROVIDER_SITE_OTHER): Payer: Medicare Other | Admitting: *Deleted

## 2013-02-02 VITALS — BP 160/90 | HR 66 | Resp 16 | Ht 70.0 in | Wt 194.0 lb

## 2013-02-02 DIAGNOSIS — I83893 Varicose veins of bilateral lower extremities with other complications: Secondary | ICD-10-CM | POA: Insufficient documentation

## 2013-02-02 NOTE — Progress Notes (Signed)
[facility] [place]  Patient name: Wanda Mckenzie MRN: [id number] DOB: 07/30/1935 Sex: female  REASON FOR CONSULT: painful varicose veins of the right lower extremity.  HPI: Wanda Mckenzie is a 76 y.o. female with a long history of bilateral lower extremity varicose veins. She had undergone removal of varicosities of her right lower extremity in 2003 using the Trivex system by Dr. Early. Over the years she has developed increasing varicose veins of her right lower extremity. She has varicose veins of the left lower extremity also. She does describe aching pain in both legs but more significantly on the right. He symptoms are aggravated by standing. She also has had swelling of both lower extremities and occasional cramps in her legs. She has not had any bleeding episodes from her varicose veins. Her biggest concern is a large varicose vein which protrudes above the level of the skin for approximately a centimeter and a half and is located in her right popliteal fossa. She is very concerned that this could bleed. She is unaware of any previous history of DVT or phlebitis. She is on Coumadin because of her prior history of strokes.  Past Medical History  Diagnosis Date  . Chest pain     Cardiolyte...05/2005...borderline abnormalities/Carotid doppler....01/2005...no abnormality..  . Dyslipidemia   . CVA (cerebral infarction)     old Right middle cerebral artery infarct ..probably from atrial fib....no PFO by TEE then  . Aortic insufficiency     mild...echo....11/2005/EF  55-60%  ..echo..11/2005..   . Flushing     niaspan  . Hypotension     unexplained episode in the past...pheo ruled out.  . COPD (chronic obstructive pulmonary disease)     by CXray  . Atrial fibrillation     Coumadin therapy  . Ejection fraction     EF 55-60%, echo, 2007  . Carotid bruit     Doppler, 2006, no abnormality  . Warfarin anticoagulation   . Hypertension   . Stroke     X's 2   Family  History  Problem Relation Age of Onset  . Stroke Father   . Cancer Mother   . Hypertension Mother   . Cancer Sister   . Cancer Sister   . Cancer Sister    SOCIAL HISTORY: History  Substance Use Topics  . Smoking status: Never Smoker   . Smokeless tobacco: Never Used  . Alcohol Use: No   Allergies  Allergen Reactions  . Codeine     REACTION: vomiting   Current Outpatient Prescriptions  Medication Sig Dispense Refill  . alendronate (FOSAMAX) 70 MG tablet Take 70 mg by mouth every 7 (seven) days. Take with a full glass of water on an empty stomach.      . atenolol (TENORMIN) 25 MG tablet Take 25 mg by mouth daily.        . atorvastatin (LIPITOR) 80 MG tablet Take 80 mg by mouth daily.        . calcium carbonate (OS-CAL) 600 MG TABS Take 600 mg by mouth 2 (two) times daily with a meal.        . diltiazem (CARDIZEM) 120 MG tablet Take 120 mg by mouth 2 (two) times daily.        . fenofibrate 160 MG tablet Take 160 mg by mouth daily.        . fish oil-omega-3 fatty acids 1000 MG capsule Take 2 g by mouth 2 (two) times daily.      .   Folic Acid-Vit B6-Vit B12 (FOLBEE) 2.5-25-1 MG TABS Take 1 tablet by mouth daily.        . isosorbide mononitrate (IMDUR) 30 MG 24 hr tablet Take 30 mg by mouth daily.        . Multiple Vitamin (MULTIVITAMIN) tablet Take 1 tablet by mouth daily.        . potassium chloride (KLOR-CON) 20 MEQ packet Take 20 mEq by mouth 2 (two) times daily.      . warfarin (COUMADIN) 5 MG tablet Take 1 tablet (5 mg total) by mouth as directed.  30 tablet  0  . furosemide (LASIX) 20 MG tablet Take 20 mg by mouth daily.      . vitamin B-12 (CYANOCOBALAMIN) 250 MCG tablet Take 250 mcg by mouth daily.         No current facility-administered medications for this visit.   REVIEW OF SYSTEMS: [X ] denotes positive finding; [  ] denotes negative finding  CARDIOVASCULAR:  [ ] chest pain   [ ] chest pressure   [ ] palpitations   [ ] orthopnea   [X ] dyspnea on exertion   [ ]  claudication   [ ] rest pain   [ ] DVT   [ ] phlebitis PULMONARY:   [ ] productive cough   [ ] asthma   [ ] wheezing NEUROLOGIC:   [ ] weakness  [ ] paresthesias  [ ] aphasia  [ ] amaurosis  [X ] dizziness HEMATOLOGIC:   [ ] bleeding problems   [ ] clotting disorders MUSCULOSKELETAL:  [ ] joint pain   [ ] joint swelling [X ] leg swelling GASTROINTESTINAL: [ ]  blood in stool  [ ]  hematemesis GENITOURINARY:  [ ]  dysuria  [ ]  hematuria PSYCHIATRIC:  [ ] history of major depression INTEGUMENTARY:  [ ] rashes  [ ] ulcers CONSTITUTIONAL:  [ ] fever   [ ] chills  PHYSICAL EXAM: Filed Vitals:   02/02/13 1036  BP: 160/90  Pulse: 66  Resp: 16  Height: 5' 10" (1.778 m)  Weight: 194 lb (87.998 kg)  SpO2: 98%   Body mass index is 27.84 kg/(m^2). GENERAL: The patient is a well-nourished female, in no acute distress. The vital signs are documented above. CARDIOVASCULAR: There is a regular rate and rhythm. I did not detect carotid bruits. She has palpable femoral pulses and palpable pedal pulses bilaterally. PULMONARY: There is good air exchange bilaterally without wheezing or rales. ABDOMEN: Soft and non-tender with normal pitched bowel sounds.  MUSCULOSKELETAL: There are no major deformities or cyanosis. NEUROLOGIC: No focal weakness or paresthesias are detected. SKIN: She has significant varicose veins of both lower extremities. On the right side the larger varicosities are located along the lateral aspect of her right thigh extending down to her lateral right leg. There is a large cluster of varicosities behind her knee on the right and a large protruding direct varicosities that extends approximately 1-1/2 cm above the level of the skin. She has hyperpigmentation bilaterally. PSYCHIATRIC: The patient has a normal affect.  DATA:  I have independently interpreted her venous duplex scan which does show some incompetence of the saphenofemoral junction on the right and also in the mid saphenous  vein in the right thigh and also the saphenous vein in the leg. Diameters in the vein range from 0.24 2.30 cm.  I've also reviewed her previous records that were sent were for her. She is on Coumadin because of   her history of stroke. It has been many years since she has stroke and she cannot remember any specific symptoms related to this appeared  MEDICAL ISSUES:  PAINFUL VARICOSE VEINS OF THE RIGHT LOWER EXTREMITY: Biggest concern is the large varicosity behind her popliteal space on the right which protrudes well above the skin. I think this would be at significant risk for bleeding. Because of the location I do not think she is a good candidate for compression stockings currently. I've recommended segmental excision of varicose veins along the lateral aspect of her right thigh and leg with special attention to this large varicosity behind her knee. We have discussed the procedure potential complications and she is agreeable to proceed. We will need to stop her Coumadin 5 days prior to the procedure. This can be done as an outpatient.   DICKSON,CHRISTOPHER S [facility] [place] Beeper: [phone]   \ 

## 2013-02-04 ENCOUNTER — Other Ambulatory Visit: Payer: Self-pay | Admitting: *Deleted

## 2013-02-07 ENCOUNTER — Encounter (HOSPITAL_COMMUNITY): Payer: Self-pay | Admitting: Pharmacy Technician

## 2013-02-07 ENCOUNTER — Encounter (HOSPITAL_COMMUNITY): Payer: Self-pay | Admitting: *Deleted

## 2013-02-07 MED ORDER — SODIUM CHLORIDE 0.9 % IV SOLN: INTRAVENOUS | Status: DC

## 2013-02-07 MED ORDER — DEXTROSE 5 % IV SOLN
1.5000 g | INTRAVENOUS | Status: AC
  Administered 2013-02-08: 1.5 g via INTRAVENOUS
  Filled 2013-02-07: qty 1.5

## 2013-02-08 ENCOUNTER — Ambulatory Visit (HOSPITAL_COMMUNITY): Payer: Medicare Other | Admitting: Anesthesiology

## 2013-02-08 ENCOUNTER — Encounter (HOSPITAL_COMMUNITY): Payer: Self-pay | Admitting: *Deleted

## 2013-02-08 ENCOUNTER — Encounter (HOSPITAL_COMMUNITY)
Admission: RE | Disposition: A | Discharge status: Home / Self Care | Payer: Self-pay | Source: Ambulatory Visit | Attending: Vascular Surgery

## 2013-02-08 ENCOUNTER — Ambulatory Visit (HOSPITAL_COMMUNITY)
Admission: RE | Admit: 2013-02-08 | Discharge: 2013-02-08 | Disposition: A | Discharge status: Home / Self Care | Payer: Medicare Other | Source: Ambulatory Visit | Attending: Vascular Surgery | Admitting: Vascular Surgery

## 2013-02-08 ENCOUNTER — Encounter (HOSPITAL_COMMUNITY): Payer: Self-pay | Admitting: Anesthesiology

## 2013-02-08 ENCOUNTER — Ambulatory Visit (HOSPITAL_COMMUNITY): Payer: Medicare Other

## 2013-02-08 DIAGNOSIS — I359 Nonrheumatic aortic valve disorder, unspecified: Secondary | ICD-10-CM | POA: Insufficient documentation

## 2013-02-08 DIAGNOSIS — J449 Chronic obstructive pulmonary disease, unspecified: Secondary | ICD-10-CM | POA: Insufficient documentation

## 2013-02-08 DIAGNOSIS — I8390 Asymptomatic varicose veins of unspecified lower extremity: Secondary | ICD-10-CM | POA: Insufficient documentation

## 2013-02-08 DIAGNOSIS — E785 Hyperlipidemia, unspecified: Secondary | ICD-10-CM | POA: Insufficient documentation

## 2013-02-08 DIAGNOSIS — Z8673 Personal history of transient ischemic attack (TIA), and cerebral infarction without residual deficits: Secondary | ICD-10-CM | POA: Insufficient documentation

## 2013-02-08 DIAGNOSIS — J4489 Other specified chronic obstructive pulmonary disease: Secondary | ICD-10-CM | POA: Insufficient documentation

## 2013-02-08 DIAGNOSIS — Z7901 Long term (current) use of anticoagulants: Secondary | ICD-10-CM | POA: Insufficient documentation

## 2013-02-08 DIAGNOSIS — Z79899 Other long term (current) drug therapy: Secondary | ICD-10-CM | POA: Insufficient documentation

## 2013-02-08 DIAGNOSIS — Z885 Allergy status to narcotic agent status: Secondary | ICD-10-CM | POA: Insufficient documentation

## 2013-02-08 DIAGNOSIS — I4891 Unspecified atrial fibrillation: Secondary | ICD-10-CM | POA: Insufficient documentation

## 2013-02-08 DIAGNOSIS — I83893 Varicose veins of bilateral lower extremities with other complications: Secondary | ICD-10-CM

## 2013-02-08 DIAGNOSIS — I10 Essential (primary) hypertension: Secondary | ICD-10-CM | POA: Insufficient documentation

## 2013-02-08 HISTORY — PX: VEIN LIGATION AND STRIPPING: SHX2653

## 2013-02-08 LAB — URINE MICROSCOPIC-ADD ON

## 2013-02-08 LAB — APTT: aPTT: 25 seconds (ref 24–37)

## 2013-02-08 LAB — COMPREHENSIVE METABOLIC PANEL
ALT: 20 U/L (ref 0–35)
AST: 33 U/L (ref 0–37)
Albumin: 3.6 g/dL (ref 3.5–5.2)
Alkaline Phosphatase: 33 U/L — ABNORMAL LOW (ref 39–117)
Chloride: 106 mEq/L (ref 96–112)
Potassium: 3.6 mEq/L (ref 3.5–5.1)
Sodium: 140 mEq/L (ref 135–145)
Total Bilirubin: 0.8 mg/dL (ref 0.3–1.2)

## 2013-02-08 LAB — URINALYSIS, ROUTINE W REFLEX MICROSCOPIC
Hgb urine dipstick: NEGATIVE
Specific Gravity, Urine: 1.022 (ref 1.005–1.030)
pH: 6 (ref 5.0–8.0)

## 2013-02-08 LAB — CBC
HCT: 36.1 % (ref 36.0–46.0)
Platelets: 263 10*3/uL (ref 150–400)
RDW: 13.7 % (ref 11.5–15.5)
WBC: 6.8 10*3/uL (ref 4.0–10.5)

## 2013-02-08 LAB — SURGICAL PCR SCREEN
MRSA, PCR: NEGATIVE
Staphylococcus aureus: NEGATIVE

## 2013-02-08 SURGERY — LIGATION AND STRIPPING, VARICOSE VEIN
Anesthesia: General | Site: Leg Lower | Laterality: Right | Wound class: Clean

## 2013-02-08 MED ORDER — GLYCOPYRROLATE 0.2 MG/ML IJ SOLN
INTRAMUSCULAR | Status: DC | PRN
  Administered 2013-02-08: 0.6 mg via INTRAVENOUS

## 2013-02-08 MED ORDER — MEPERIDINE HCL 25 MG/ML IJ SOLN: 6.2500 mg | INTRAMUSCULAR | Status: DC | PRN

## 2013-02-08 MED ORDER — LACTATED RINGERS IV SOLN
INTRAVENOUS | Status: DC
  Administered 2013-02-08: 10:00:00 via INTRAVENOUS

## 2013-02-08 MED ORDER — OXYCODONE-ACETAMINOPHEN 5-325 MG PO TABS: 1.0000 | ORAL_TABLET | ORAL | Status: DC | PRN

## 2013-02-08 MED ORDER — SODIUM CHLORIDE 0.9 % IJ SOLN
INTRAMUSCULAR | Status: AC
  Filled 2013-02-08: qty 9

## 2013-02-08 MED ORDER — LIDOCAINE HCL (CARDIAC) 20 MG/ML IV SOLN
INTRAVENOUS | Status: DC | PRN
  Administered 2013-02-08: 100 mg via INTRAVENOUS

## 2013-02-08 MED ORDER — HYDROMORPHONE HCL PF 1 MG/ML IJ SOLN
INTRAMUSCULAR | Status: AC
  Filled 2013-02-08: qty 1

## 2013-02-08 MED ORDER — HYDROMORPHONE HCL PF 1 MG/ML IJ SOLN
0.2500 mg | INTRAMUSCULAR | Status: DC | PRN
  Administered 2013-02-08: 0.25 mg via INTRAVENOUS

## 2013-02-08 MED ORDER — PROMETHAZINE HCL 25 MG/ML IJ SOLN
INTRAMUSCULAR | Status: AC
  Filled 2013-02-08: qty 1

## 2013-02-08 MED ORDER — PROPOFOL 10 MG/ML IV BOLUS
INTRAVENOUS | Status: DC | PRN
  Administered 2013-02-08: 100 mg via INTRAVENOUS

## 2013-02-08 MED ORDER — NEOSTIGMINE METHYLSULFATE 1 MG/ML IJ SOLN
INTRAMUSCULAR | Status: DC | PRN
  Administered 2013-02-08: 4 mg via INTRAVENOUS

## 2013-02-08 MED ORDER — FENTANYL CITRATE 0.05 MG/ML IJ SOLN
INTRAMUSCULAR | Status: DC | PRN
  Administered 2013-02-08 (×3): 50 ug via INTRAVENOUS

## 2013-02-08 MED ORDER — OXYCODONE HCL 5 MG PO TABS: 5.0000 mg | ORAL_TABLET | Freq: Once | ORAL | Status: DC | PRN

## 2013-02-08 MED ORDER — MIDAZOLAM HCL 2 MG/2ML IJ SOLN: 0.5000 mg | Freq: Once | INTRAMUSCULAR | Status: DC | PRN

## 2013-02-08 MED ORDER — OXYCODONE HCL 5 MG/5ML PO SOLN
5.0000 mg | Freq: Once | ORAL | Status: DC | PRN
Start: 2013-02-08 — End: 2013-02-08

## 2013-02-08 MED ORDER — ROCURONIUM BROMIDE 100 MG/10ML IV SOLN
INTRAVENOUS | Status: DC | PRN
  Administered 2013-02-08: 50 mg via INTRAVENOUS

## 2013-02-08 MED ORDER — 0.9 % SODIUM CHLORIDE (POUR BTL) OPTIME
TOPICAL | Status: DC | PRN
  Administered 2013-02-08: 1000 mL

## 2013-02-08 MED ORDER — PROMETHAZINE HCL 25 MG/ML IJ SOLN
6.2500 mg | INTRAMUSCULAR | Status: DC | PRN
  Administered 2013-02-08: 6.25 mg via INTRAVENOUS

## 2013-02-08 MED ORDER — MUPIROCIN 2 % EX OINT
TOPICAL_OINTMENT | CUTANEOUS | Status: AC
  Administered 2013-02-08: 1
  Filled 2013-02-08: qty 22

## 2013-02-08 MED ORDER — ONDANSETRON HCL 4 MG/2ML IJ SOLN
INTRAMUSCULAR | Status: DC | PRN
  Administered 2013-02-08: 4 mg via INTRAVENOUS

## 2013-02-08 SURGICAL SUPPLY — 51 items
BAG ISOLATION DRAPE 18X18 (DRAPES) ×1 IMPLANT
BANDAGE ELASTIC 4 VELCRO ST LF (GAUZE/BANDAGES/DRESSINGS) ×2 IMPLANT
BANDAGE ELASTIC 6 VELCRO ST LF (GAUZE/BANDAGES/DRESSINGS) ×2 IMPLANT
BANDAGE GAUZE ELAST BULKY 4 IN (GAUZE/BANDAGES/DRESSINGS) ×2 IMPLANT
CANISTER SUCTION 2500CC (MISCELLANEOUS) ×2 IMPLANT
CLIP TI MEDIUM 6 (CLIP) ×2 IMPLANT
CLIP TI WIDE RED SMALL 24 (CLIP) ×2 IMPLANT
CLOTH BEACON ORANGE TIMEOUT ST (SAFETY) ×2 IMPLANT
CLSR STERI-STRIP ANTIMIC 1/2X4 (GAUZE/BANDAGES/DRESSINGS) ×2 IMPLANT
COVER SURGICAL LIGHT HANDLE (MISCELLANEOUS) ×2 IMPLANT
DERMABOND ADVANCED (GAUZE/BANDAGES/DRESSINGS) ×1
DERMABOND ADVANCED .7 DNX12 (GAUZE/BANDAGES/DRESSINGS) ×1 IMPLANT
DRAPE INCISE IOBAN 66X45 STRL (DRAPES) ×2 IMPLANT
DRAPE ISOLATION BAG 18X18 (DRAPES) ×1
DRSG COVADERM 4X8 (GAUZE/BANDAGES/DRESSINGS) IMPLANT
ELECT REM PT RETURN 9FT ADLT (ELECTROSURGICAL) ×2
ELECTRODE REM PT RTRN 9FT ADLT (ELECTROSURGICAL) ×1 IMPLANT
GLOVE BIO SURGEON STRL SZ7.5 (GLOVE) ×4 IMPLANT
GLOVE BIOGEL PI IND STRL 7.5 (GLOVE) ×1 IMPLANT
GLOVE BIOGEL PI IND STRL 8 (GLOVE) ×1 IMPLANT
GLOVE BIOGEL PI INDICATOR 7.5 (GLOVE) ×1
GLOVE BIOGEL PI INDICATOR 8 (GLOVE) ×1
GLOVE SURG SS PI 6.5 STRL IVOR (GLOVE) ×2 IMPLANT
GOWN STRL NON-REIN LRG LVL3 (GOWN DISPOSABLE) ×6 IMPLANT
KIT BASIN OR (CUSTOM PROCEDURE TRAY) ×2 IMPLANT
KIT ROOM TURNOVER OR (KITS) ×2 IMPLANT
NEEDLE HYPO 25GX1X1/2 BEV (NEEDLE) IMPLANT
NS IRRIG 1000ML POUR BTL (IV SOLUTION) ×2 IMPLANT
PACK GENERAL/GYN (CUSTOM PROCEDURE TRAY) ×2 IMPLANT
PACK UNIVERSAL I (CUSTOM PROCEDURE TRAY) ×2 IMPLANT
PAD ARMBOARD 7.5X6 YLW CONV (MISCELLANEOUS) ×4 IMPLANT
SPECIMEN JAR SMALL (MISCELLANEOUS) ×2 IMPLANT
SPONGE GAUZE 4X4 12PLY (GAUZE/BANDAGES/DRESSINGS) ×2 IMPLANT
STAPLER VISISTAT 35W (STAPLE) IMPLANT
STRIP CLOSURE SKIN 1/2X4 (GAUZE/BANDAGES/DRESSINGS) IMPLANT
SUT SILK 2 0 (SUTURE) ×1
SUT SILK 2 0 SH (SUTURE) ×2 IMPLANT
SUT SILK 2-0 18XBRD TIE 12 (SUTURE) ×1 IMPLANT
SUT SILK 3 0 (SUTURE) ×1
SUT SILK 3-0 18XBRD TIE 12 (SUTURE) ×1 IMPLANT
SUT SILK 4 0 (SUTURE) ×1
SUT SILK 4-0 18XBRD TIE 12 (SUTURE) ×1 IMPLANT
SUT VIC AB 3-0 SH 27 (SUTURE) ×2
SUT VIC AB 3-0 SH 27X BRD (SUTURE) ×2 IMPLANT
SUT VICRYL 4-0 PS2 18IN ABS (SUTURE) ×4 IMPLANT
SYR CONTROL 10ML LL (SYRINGE) IMPLANT
TOWEL OR 17X24 6PK STRL BLUE (TOWEL DISPOSABLE) ×2 IMPLANT
TOWEL OR 17X26 10 PK STRL BLUE (TOWEL DISPOSABLE) ×2 IMPLANT
UNDERPAD 30X30 INCONTINENT (UNDERPADS AND DIAPERS) ×2 IMPLANT
VEIN STRIPPER DISP (MISCELLANEOUS) ×2 IMPLANT
WATER STERILE IRR 1000ML POUR (IV SOLUTION) ×2 IMPLANT

## 2013-02-08 NOTE — Op Note (Signed)
NAME: Wanda Mckenzie   MRN: [id number] DOB: Mar 17, 1936    DATE OF OPERATION: 02/08/2013  PREOP DIAGNOSIS: painful varicose veins of right lower extremity  POSTOP DIAGNOSIS: same  PROCEDURE: segmental excision of painful varicose veins of right lower extremity  SURGEON: Di Kindle. Edilia Bo, MD, FACS  ASSIST: none  ANESTHESIA: Gen.   EBL: minimal  INDICATIONS: Wanda Mckenzie is a 77 y.o. female has a long history of varicose veins. Her veins have become more symptomatic and she had a large area in her popliteal fossa which extended approximately a centimeter and a half above the skin I felt was at significant risk for bleeding. I. Therefore recommended segmental excision of her varicose veins along the lateral aspect of her right leg.  FINDINGS: large varicosities lateral aspect of the right leg.  TECHNIQUE: The patient was brought to the operating room and received a general anesthetic. The patient was then placed prone. The right lower extremity was prepped and draped in the usual sterile fashion. The large varicose veins had been marked preoperatively with the patient standing. Using proximally 7 small transverse incisions overlying the varicosities, the veins were dissected free and bluntly excised. The large cluster in her popliteal fossa was also exposed through an incision that was approximately a centimeter a half in length. These veins likewise were removed and pressure held for hemostasis. This wound was closed with a running 4-0 Vicryl. The remaining wounds were closed with a simple 4-0 Vicryl suture. Steri-Strips were applied. Next 4 x 4's were placed over the incisions for pressure and this was held in place with Kerlix and a 4 inch and 6 inch Ace. Patient tolerated the procedure well and was transferred to the recovery room in stable condition. All needle and sponge counts were correct.  Waverly Ferrari, MD, FACS [facility]  DATE OF  DICTATION:   02/08/2013

## 2013-02-08 NOTE — Preoperative (Signed)
Beta Blockers   Reason not to administer Beta Blockers:Not Applicable 

## 2013-02-08 NOTE — Anesthesia Procedure Notes (Signed)
Procedure Name: Intubation Date/Time: 02/08/2013 12:26 PM Performed by: Sheppard Evens Pre-anesthesia Checklist: Patient identified, Emergency Drugs available, Suction available, Patient being monitored and Timeout performed Patient Re-evaluated:Patient Re-evaluated prior to inductionOxygen Delivery Method: Circle system utilized Preoxygenation: Pre-oxygenation with 100% oxygen Intubation Type: IV induction Ventilation: Mask ventilation without difficulty Laryngoscope Size: Miller and 2 (intubation by B. Myer CRNA) Grade View: Grade I Tube type: Oral Tube size: 7.5 mm Number of attempts: 1 Airway Equipment and Method: Stylet Placement Confirmation: ETT inserted through vocal cords under direct vision,  positive ETCO2 and breath sounds checked- equal and bilateral Secured at: 21 cm Tube secured with: Tape Dental Injury: Teeth and Oropharynx as per pre-operative assessment

## 2013-02-08 NOTE — Transfer of Care (Signed)
Immediate Anesthesia Transfer of Care Note  Patient: Wanda Mckenzie  Procedure(s) Performed: Procedure(s): Segmental excision of painful varicose veins right lower extremity (Right)  Patient Location: PACU  Anesthesia Type:General  Level of Consciousness: awake, alert , oriented and sedated  Airway & Oxygen Therapy: Patient Spontanous Breathing and Patient connected to nasal cannula oxygen  Post-op Assessment: Report given to PACU RN, Post -op Vital signs reviewed and stable and Patient moving all extremities  Post vital signs: Reviewed and stable  Complications: No apparent anesthesia complications

## 2013-02-08 NOTE — Anesthesia Postprocedure Evaluation (Signed)
  Anesthesia Post-op Note  Patient: Wanda Mckenzie  Procedure(s) Performed: Procedure(s): Segmental excision of painful varicose veins right lower extremity (Right)  Patient Location: PACU  Anesthesia Type:General  Level of Consciousness: awake, alert , oriented and patient cooperative  Airway and Oxygen Therapy: Patient Spontanous Breathing  Post-op Pain: mild  Post-op Assessment: Post-op Vital signs reviewed, Patient's Cardiovascular Status Stable, Respiratory Function Stable, Patent Airway, No signs of Nausea or vomiting and Pain level controlled  Post-op Vital Signs: Reviewed and stable  Complications: No apparent anesthesia complications

## 2013-02-08 NOTE — Anesthesia Preprocedure Evaluation (Signed)
Anesthesia Evaluation  Patient identified by MRN, date of birth, ID band Patient awake    Reviewed: Allergy & Precautions, H&P , NPO status , Patient's Chart, lab work & pertinent test results, reviewed documented beta blocker date and time   History of Anesthesia Complications Negative for: history of anesthetic complications  Airway Mallampati: II TM Distance: >3 FB Neck ROM: Full    Dental  (+) Caps and Dental Advisory Given   Pulmonary shortness of breath, neg COPD breath sounds clear to auscultation  Pulmonary exam normal       Cardiovascular hypertension, Pt. on medications and Pt. on home beta blockers + Peripheral Vascular Disease + dysrhythmias (coumadin, INR 1.12) Atrial Fibrillation Rhythm:Irregular Rate:Normal  '07 ECHO: normal LVF, EF 55-60%, mild MR, mild AI   Neuro/Psych CVA, No Residual Symptoms    GI/Hepatic negative GI ROS, Neg liver ROS,   Endo/Other  negative endocrine ROS  Renal/GU negative Renal ROS     Musculoskeletal   Abdominal   Peds  Hematology   Anesthesia Other Findings   Reproductive/Obstetrics                           Anesthesia Physical Anesthesia Plan  ASA: III  Anesthesia Plan: General   Post-op Pain Management:    Induction: Intravenous  Airway Management Planned: Oral ETT  Additional Equipment:   Intra-op Plan:   Post-operative Plan: Extubation in OR  Informed Consent: I have reviewed the patients History and Physical, chart, labs and discussed the procedure including the risks, benefits and alternatives for the proposed anesthesia with the patient or authorized representative who has indicated his/her understanding and acceptance.   Dental advisory given  Plan Discussed with: CRNA and Surgeon  Anesthesia Plan Comments: (Plan routine monitors, GETA)        Anesthesia Quick Evaluation

## 2013-02-08 NOTE — Interval H&P Note (Signed)
History and Physical Interval Note:  02/08/2013 11:49 AM  Wanda Mckenzie  has presented today for surgery, with the diagnosis of VV  The various methods of treatment have been discussed with the patient and family. After consideration of risks, benefits and other options for treatment, the patient has consented to  Procedure(s): VEIN LIGATION AND STRIPPING (Right) as a surgical intervention .  [name] history has been reviewed, patient examined, no change in status, stable for surgery.  I have reviewed the patient's chart and labs.  Questions were answered to the patient's satisfaction.     DICKSON,CHRISTOPHER S

## 2013-02-08 NOTE — H&P (View-Only) (Signed)
[facility]   Patient name: Wanda Mckenzie MRN: [id number] DOB: 06/07/1936 Sex: female  REASON FOR CONSULT: painful varicose veins of the right lower extremity.  HPI: Wanda Mckenzie is a 77 y.o. female with a long history of bilateral lower extremity varicose veins. She had undergone removal of varicosities of her right lower extremity in 2003 using the Trivex system by Dr. Arbie Cookey. Over the years she has developed increasing varicose veins of her right lower extremity. She has varicose veins of the left lower extremity also. She does describe aching pain in both legs but more significantly on the right. He symptoms are aggravated by standing. She also has had swelling of both lower extremities and occasional cramps in her legs. She has not had any bleeding episodes from her varicose veins. Her biggest concern is a large varicose vein which protrudes above the level of the skin for approximately a centimeter and a half and is located in her right popliteal fossa. She is very concerned that this could bleed. She is unaware of any previous history of DVT or phlebitis. She is on Coumadin because of her prior history of strokes.  Past Medical History  Diagnosis Date  . Chest pain     Cardiolyte...05/2005...borderline abnormalities/Carotid doppler....01/2005...no abnormality..  . Dyslipidemia   . CVA (cerebral infarction)     old Right middle cerebral artery infarct ..probably from atrial fib....no PFO by TEE then  . Aortic insufficiency     mild...echo....11/2005/EF  55-60%  ..echo..11/2005..   . Flushing     niaspan  . Hypotension     unexplained episode in the past...pheo ruled out.  Marland Kitchen COPD (chronic obstructive pulmonary disease)     by CXray  . Atrial fibrillation     Coumadin therapy  . Ejection fraction     EF 55-60%, echo, 2007  . Carotid bruit     Doppler, 2006, no abnormality  . Warfarin anticoagulation   . Hypertension   . Stroke     X's 2   Family  History  Problem Relation Age of Onset  . Stroke Father   . Cancer Mother   . Hypertension Mother   . Cancer Sister   . Cancer Sister   . Cancer Sister    SOCIAL HISTORY: History  Substance Use Topics  . Smoking status: Never Smoker   . Smokeless tobacco: Never Used  . Alcohol Use: No   Allergies  Allergen Reactions  . Codeine     REACTION: vomiting   Current Outpatient Prescriptions  Medication Sig Dispense Refill  . alendronate (FOSAMAX) 70 MG tablet Take 70 mg by mouth every 7 (seven) days. Take with a full glass of water on an empty stomach.      Marland Kitchen atenolol (TENORMIN) 25 MG tablet Take 25 mg by mouth daily.        Marland Kitchen atorvastatin (LIPITOR) 80 MG tablet Take 80 mg by mouth daily.        . calcium carbonate (OS-CAL) 600 MG TABS Take 600 mg by mouth 2 (two) times daily with a meal.        . diltiazem (CARDIZEM) 120 MG tablet Take 120 mg by mouth 2 (two) times daily.        . fenofibrate 160 MG tablet Take 160 mg by mouth daily.        . fish oil-omega-3 fatty acids 1000 MG capsule Take 2 g by mouth 2 (two) times daily.      Marland Kitchen  Folic Acid-Vit B6-Vit B12 (FOLBEE) 2.5-25-1 MG TABS Take 1 tablet by mouth daily.        . isosorbide mononitrate (IMDUR) 30 MG 24 hr tablet Take 30 mg by mouth daily.        . Multiple Vitamin (MULTIVITAMIN) tablet Take 1 tablet by mouth daily.        . potassium chloride (KLOR-CON) 20 MEQ packet Take 20 mEq by mouth 2 (two) times daily.      Marland Kitchen warfarin (COUMADIN) 5 MG tablet Take 1 tablet (5 mg total) by mouth as directed.  30 tablet  0  . furosemide (LASIX) 20 MG tablet Take 20 mg by mouth daily.      . vitamin B-12 (CYANOCOBALAMIN) 250 MCG tablet Take 250 mcg by mouth daily.         No current facility-administered medications for this visit.   REVIEW OF SYSTEMS: Arly.Keller ] denotes positive finding; [  ] denotes negative finding  CARDIOVASCULAR:  [ ]  chest pain   [ ]  chest pressure   [ ]  palpitations   [ ]  orthopnea   Arly.Keller ] dyspnea on exertion   [ ]   claudication   [ ]  rest pain   [ ]  DVT   [ ]  phlebitis PULMONARY:   [ ]  productive cough   [ ]  asthma   [ ]  wheezing NEUROLOGIC:   [ ]  weakness  [ ]  paresthesias  [ ]  aphasia  [ ]  amaurosis  Arly.Keller ] dizziness HEMATOLOGIC:   [ ]  bleeding problems   [ ]  clotting disorders MUSCULOSKELETAL:  [ ]  joint pain   [ ]  joint swelling Arly.Keller ] leg swelling GASTROINTESTINAL: [ ]   blood in stool  [ ]   hematemesis GENITOURINARY:  [ ]   dysuria  [ ]   hematuria PSYCHIATRIC:  [ ]  history of major depression INTEGUMENTARY:  [ ]  rashes  [ ]  ulcers CONSTITUTIONAL:  [ ]  fever   [ ]  chills  PHYSICAL EXAM: Filed Vitals:   02/02/13 1036  BP: 160/90  Pulse: 66  Resp: 16  Height: 5\' 10"  (1.778 m)  Weight: 194 lb (87.998 kg)  SpO2: 98%   Body mass index is 27.84 kg/(m^2). GENERAL: The patient is a well-nourished female, in no acute distress. The vital signs are documented above. CARDIOVASCULAR: There is a regular rate and rhythm. I did not detect carotid bruits. She has palpable femoral pulses and palpable pedal pulses bilaterally. PULMONARY: There is good air exchange bilaterally without wheezing or rales. ABDOMEN: Soft and non-tender with normal pitched bowel sounds.  MUSCULOSKELETAL: There are no major deformities or cyanosis. NEUROLOGIC: No focal weakness or paresthesias are detected. SKIN: She has significant varicose veins of both lower extremities. On the right side the larger varicosities are located along the lateral aspect of her right thigh extending down to her lateral right leg. There is a large cluster of varicosities behind her knee on the right and a large protruding direct varicosities that extends approximately 1-1/2 cm above the level of the skin. She has hyperpigmentation bilaterally. PSYCHIATRIC: The patient has a normal affect.  DATA:  I have independently interpreted her venous duplex scan which does show some incompetence of the saphenofemoral junction on the right and also in the mid saphenous  vein in the right thigh and also the saphenous vein in the leg. Diameters in the vein range from 0.24 2.30 cm.  I've also reviewed her previous records that were sent were for her. She is on Coumadin because of  her history of stroke. It has been many years since she has stroke and she cannot remember any specific symptoms related to this appeared  MEDICAL ISSUES:  PAINFUL VARICOSE VEINS OF THE RIGHT LOWER EXTREMITY: Biggest concern is the large varicosity behind her popliteal space on the right which protrudes well above the skin. I think this would be at significant risk for bleeding. Because of the location I do not think she is a good candidate for compression stockings currently. I've recommended segmental excision of varicose veins along the lateral aspect of her right thigh and leg with special attention to this large varicosity behind her knee. We have discussed the procedure potential complications and she is agreeable to proceed. We will need to stop her Coumadin 5 days prior to the procedure. This can be done as an outpatient.   Alexsus Papadopoulos S [facility] [place] [facility]: [phone]   \

## 2013-02-09 ENCOUNTER — Encounter (HOSPITAL_COMMUNITY): Payer: Self-pay | Admitting: Vascular Surgery

## 2013-02-09 ENCOUNTER — Telehealth: Payer: Self-pay | Admitting: Vascular Surgery

## 2013-02-09 NOTE — Telephone Encounter (Signed)
Message copied by Jena Gauss on Wed Feb 09, 2013 10:40 AM ------      Message from: Marlowe Shores      Created: Tue Feb 08, 2013  1:03 PM       2 week F/U VV stripping - dickson ------

## 2013-02-15 ENCOUNTER — Ambulatory Visit (INDEPENDENT_AMBULATORY_CARE_PROVIDER_SITE_OTHER): Payer: Medicare Other | Admitting: *Deleted

## 2013-02-15 DIAGNOSIS — Z7901 Long term (current) use of anticoagulants: Secondary | ICD-10-CM

## 2013-02-15 DIAGNOSIS — I4891 Unspecified atrial fibrillation: Secondary | ICD-10-CM

## 2013-02-15 DIAGNOSIS — I6350 Cerebral infarction due to unspecified occlusion or stenosis of unspecified cerebral artery: Secondary | ICD-10-CM

## 2013-02-15 DIAGNOSIS — I639 Cerebral infarction, unspecified: Secondary | ICD-10-CM

## 2013-02-22 ENCOUNTER — Encounter: Payer: Self-pay | Admitting: Vascular Surgery

## 2013-02-23 ENCOUNTER — Encounter: Payer: Self-pay | Admitting: Vascular Surgery

## 2013-02-23 ENCOUNTER — Ambulatory Visit (INDEPENDENT_AMBULATORY_CARE_PROVIDER_SITE_OTHER): Payer: Self-pay | Admitting: Vascular Surgery

## 2013-02-23 VITALS — BP 172/69 | HR 50 | Ht 70.0 in | Wt 196.0 lb

## 2013-02-23 DIAGNOSIS — I83893 Varicose veins of bilateral lower extremities with other complications: Secondary | ICD-10-CM

## 2013-02-23 NOTE — Progress Notes (Signed)
Patient name: Wanda Mckenzie MRN: [id number] DOB: [birth year] Sex: female  REASON FOR VISIT: follow up after segmental excision of varicose veins of the right lower extremity.  HPI: Wanda Mckenzie is a 77 y.o. female who had a long history of varicose veins. Her veins became more symptomatic and she had a large area in her popliteal fossa on the right which extended approximately a centimeter a half above the level of the skin. I thought she was at significant risk for bleeding. She was brought in for elective segmental excision of painful varicose veins of the right lower extremity.she comes in for follow visit. As no specific complaints. She has had no drainage.  REVIEW OF SYSTEMS: Arly.Keller ] denotes positive finding; [  ] denotes negative finding  CARDIOVASCULAR:  [ ]  chest pain   [ ]  dyspnea on exertion    CONSTITUTIONAL:  [ ]  fever   [ ]  chills  PHYSICAL EXAM: Filed Vitals:   02/23/13 1526  BP: 172/69  Pulse: 50  Height: 5\' 10"  (1.778 m)  Weight: 196 lb (88.905 kg)  SpO2: 97%   Body mass index is 28.12 kg/(m^2). GENERAL: The patient is a well-nourished female, in no acute distress. The vital signs are documented above. CARDIOVASCULAR: There is a regular rate and rhythm  PULMONARY: There is good air exchange bilaterally without wheezing or rales. Her incisions are all healing nicely.  MEDICAL ISSUES: The patient is doing well status post segmental excision of varicose veins of the right lower extremity. I will see her back as needed.  Eron Goble S [facility] [place] [facility]: [phone]

## 2013-03-18 ENCOUNTER — Ambulatory Visit (INDEPENDENT_AMBULATORY_CARE_PROVIDER_SITE_OTHER): Payer: Medicare Other | Admitting: *Deleted

## 2013-03-18 DIAGNOSIS — I4891 Unspecified atrial fibrillation: Secondary | ICD-10-CM

## 2013-03-18 DIAGNOSIS — I6350 Cerebral infarction due to unspecified occlusion or stenosis of unspecified cerebral artery: Secondary | ICD-10-CM

## 2013-03-18 DIAGNOSIS — I639 Cerebral infarction, unspecified: Secondary | ICD-10-CM

## 2013-03-18 DIAGNOSIS — Z7901 Long term (current) use of anticoagulants: Secondary | ICD-10-CM

## 2013-04-29 ENCOUNTER — Ambulatory Visit (INDEPENDENT_AMBULATORY_CARE_PROVIDER_SITE_OTHER): Payer: Medicare Other | Admitting: *Deleted

## 2013-04-29 DIAGNOSIS — I4891 Unspecified atrial fibrillation: Secondary | ICD-10-CM

## 2013-04-29 DIAGNOSIS — Z7901 Long term (current) use of anticoagulants: Secondary | ICD-10-CM

## 2013-04-29 DIAGNOSIS — I6350 Cerebral infarction due to unspecified occlusion or stenosis of unspecified cerebral artery: Secondary | ICD-10-CM

## 2013-04-29 DIAGNOSIS — I639 Cerebral infarction, unspecified: Secondary | ICD-10-CM

## 2013-06-10 ENCOUNTER — Ambulatory Visit (INDEPENDENT_AMBULATORY_CARE_PROVIDER_SITE_OTHER): Payer: Medicare Other | Admitting: Pharmacist

## 2013-06-10 DIAGNOSIS — I6350 Cerebral infarction due to unspecified occlusion or stenosis of unspecified cerebral artery: Secondary | ICD-10-CM

## 2013-06-10 DIAGNOSIS — Z7901 Long term (current) use of anticoagulants: Secondary | ICD-10-CM

## 2013-06-10 DIAGNOSIS — I639 Cerebral infarction, unspecified: Secondary | ICD-10-CM

## 2013-06-10 DIAGNOSIS — I4891 Unspecified atrial fibrillation: Secondary | ICD-10-CM

## 2013-06-10 LAB — POCT INR: INR: 3.2

## 2013-07-01 ENCOUNTER — Encounter: Payer: Self-pay | Admitting: Cardiology

## 2013-07-06 ENCOUNTER — Encounter: Payer: Self-pay | Admitting: Cardiology

## 2013-07-06 ENCOUNTER — Encounter (INDEPENDENT_AMBULATORY_CARE_PROVIDER_SITE_OTHER): Payer: Commercial Managed Care - HMO

## 2013-07-06 ENCOUNTER — Ambulatory Visit (INDEPENDENT_AMBULATORY_CARE_PROVIDER_SITE_OTHER): Payer: 59 | Admitting: *Deleted

## 2013-07-06 ENCOUNTER — Ambulatory Visit (INDEPENDENT_AMBULATORY_CARE_PROVIDER_SITE_OTHER): Payer: Medicare HMO | Admitting: Cardiology

## 2013-07-06 VITALS — BP 174/87 | HR 78 | Ht 70.0 in | Wt 194.0 lb

## 2013-07-06 DIAGNOSIS — R079 Chest pain, unspecified: Secondary | ICD-10-CM

## 2013-07-06 DIAGNOSIS — I6350 Cerebral infarction due to unspecified occlusion or stenosis of unspecified cerebral artery: Secondary | ICD-10-CM

## 2013-07-06 DIAGNOSIS — I4891 Unspecified atrial fibrillation: Secondary | ICD-10-CM

## 2013-07-06 DIAGNOSIS — I351 Nonrheumatic aortic (valve) insufficiency: Secondary | ICD-10-CM

## 2013-07-06 DIAGNOSIS — Z7901 Long term (current) use of anticoagulants: Secondary | ICD-10-CM

## 2013-07-06 DIAGNOSIS — E785 Hyperlipidemia, unspecified: Secondary | ICD-10-CM

## 2013-07-06 DIAGNOSIS — I359 Nonrheumatic aortic valve disorder, unspecified: Secondary | ICD-10-CM

## 2013-07-06 DIAGNOSIS — I639 Cerebral infarction, unspecified: Secondary | ICD-10-CM

## 2013-07-06 DIAGNOSIS — I10 Essential (primary) hypertension: Secondary | ICD-10-CM

## 2013-07-06 LAB — POCT INR: INR: 6

## 2013-07-06 NOTE — Assessment & Plan Note (Signed)
Patient is on high-dose statin. No change in therapy.

## 2013-07-06 NOTE — Assessment & Plan Note (Signed)
There is a history of mild aortic insufficiency. I do not hear a significant murmur. We'll consider a followup echo next year.

## 2013-07-06 NOTE — Patient Instructions (Signed)
Your physician recommends that you schedule a follow-up appointment in: 1 year. You will receive a reminder letter in the mail in about 10 months reminding you to call and schedule your appointment. If you don't receive this letter, please contact our office. Your physician recommends that you continue on your current medications as directed. Please refer to the Current Medication list given to you today. Your physician has requested that you regularly monitor and record your blood pressure readings at home for 2 weeks straight. Please use the same machine at the same time of day to check your readings and record them on the handout given to you today. Please either call Isabelle Course with readings or drop it off at our office.

## 2013-07-06 NOTE — Progress Notes (Signed)
HPI  Patient is stable today. She is seen for followup of her atrial fibrillation. Her rate is controlled. She is anticoagulated. She is also seen for followup of hypertension.  Allergies  Allergen Reactions  . Codeine     REACTION: vomiting    Current Outpatient Prescriptions  Medication Sig Dispense Refill  . alendronate (FOSAMAX) 70 MG tablet Take 70 mg by mouth every 7 (seven) days. Take with a full glass of water on an empty stomach.      Marland Kitchen atenolol (TENORMIN) 25 MG tablet Take 25 mg by mouth daily.       Marland Kitchen atorvastatin (LIPITOR) 80 MG tablet Take 80 mg by mouth daily.        Marland Kitchen diltiazem (CARDIZEM) 120 MG tablet Take 120 mg by mouth 2 (two) times daily.       . fenofibrate 160 MG tablet Take 160 mg by mouth daily.        . fish oil-omega-3 fatty acids 1000 MG capsule Take 2 g by mouth 2 (two) times daily.      . Folic Acid-Vit B6-Vit B12 (FOLBEE) 2.5-25-1 MG TABS Take 1 tablet by mouth daily.        . furosemide (LASIX) 20 MG tablet Take 20 mg by mouth daily.      . isosorbide mononitrate (IMDUR) 30 MG 24 hr tablet Take 30 mg by mouth daily.       . Multiple Vitamin (MULTIVITAMIN) tablet Take 1 tablet by mouth daily.        . potassium chloride (KLOR-CON) 20 MEQ packet Take 20 mEq by mouth 2 (two) times daily.      Marland Kitchen warfarin (COUMADIN) 5 MG tablet Take 2.5-5 mg by mouth daily. Takes 5 mg on Sunday, Tuesday and Thursday and 2.5 mg all other days      . calcium carbonate (OS-CAL) 600 MG TABS Take 600 mg by mouth 2 (two) times daily with a meal.        No current facility-administered medications for this visit.    History   Social History  . Marital Status: Married    Spouse Name: N/A    Number of Children: N/A  . Years of Education: N/A   Occupational History  . Not on file.   Social History Main Topics  . Smoking status: Never Smoker   . Smokeless tobacco: Never Used  . Alcohol Use: No  . Drug Use: No  . Sexual Activity: Not on file   Other Topics Concern  .  Not on file   Social History Narrative  . No narrative on file    Family History  Problem Relation Age of Onset  . Stroke Father   . Cancer Mother   . Hypertension Mother   . Cancer Sister   . Cancer Sister   . Cancer Sister     Past Medical History  Diagnosis Date  . Chest pain     Cardiolyte...05/2005...borderline abnormalities/Carotid doppler....01/2005...no abnormality..  . Dyslipidemia   . CVA (cerebral infarction)     old Right middle cerebral artery infarct ..probably from atrial fib....no PFO by TEE then  . Aortic insufficiency     mild...echo....11/2005/EF  55-60%  ..echo..11/2005..   . Flushing     niaspan  . Hypotension     unexplained episode in the past...pheo ruled out.  Marland Kitchen COPD (chronic obstructive pulmonary disease)     by CXray  . Atrial fibrillation     Coumadin therapy  . Ejection  fraction     EF 55-60%, echo, 2007  . Carotid bruit     Doppler, 2006, no abnormality  . Warfarin anticoagulation   . Hypertension   . Shortness of breath     with exertion  . Stroke     X's 2.  No residual per patient.    Past Surgical History  Procedure Laterality Date  . Tributary varicosities of right leg  05/23/2002  . Left hip hemiarthroplasty    . Fracture surgery Left     Pt. fell and Ankle  . Abdominal hysterectomy  1975  . Vein ligation and stripping Right 02/08/2013    Procedure: Segmental excision of painful varicose veins right lower extremity;  Surgeon: Chuck Hinthristopher S Dickson, MD;  [facility];  Service: Vascular;  Laterality: Right;    Patient Active Problem List   Diagnosis Date Noted  . Varicose veins of lower extremities with other complications 02/02/2013  . Hypertension   . Chest pain   . Dyslipidemia   . CVA (cerebral infarction)   . Aortic insufficiency   . Flushing   . Hypotension   . COPD (chronic obstructive pulmonary disease)   . Atrial fibrillation   . Ejection fraction   . Carotid bruit   . Warfarin anticoagulation     ROS    Patient denies fever, chills, headache, sweats, rash, change in vision, change in hearing, chest pain, cough, nausea vomiting, urinary symptoms. All other systems are reviewed and are negative.  PHYSICAL EXAM  Patient is oriented to person time and place. Affect is normal. There is no jugulovenous distention. Lungs are clear. Respiratory effort is nonlabored. Cardiac exam reveals an S1 and S2. The rhythm is irregularly irregular. The abdomen is soft. There is no peripheral edema.  Filed Vitals:   07/06/13 0851 07/06/13 0854  BP: 184/88 174/87  Pulse:  78  Height: 5\' 10"  (1.778 m)   Weight: 194 lb (87.998 kg)    EKG is done today and reviewed by me. She has atrial fibrillation. The rate is controlled.  ASSESSMENT & PLAN

## 2013-07-06 NOTE — Assessment & Plan Note (Signed)
She's had no recurrent chest pain. No further workup is needed.

## 2013-07-06 NOTE — Assessment & Plan Note (Signed)
Atrial fibrillation rate is controlled. She is on Coumadin. No change in therapy.

## 2013-07-06 NOTE — Assessment & Plan Note (Signed)
Her systolic blood pressure is elevated today. It is certainly better than last year but still elevated. She will be checking her pressure at home and calling these pressures to [place].

## 2013-07-07 ENCOUNTER — Telehealth: Payer: Self-pay | Admitting: *Deleted

## 2013-07-07 NOTE — Telephone Encounter (Signed)
Patient called requesting directions on how to take her coumadin. Patient informed nurse that she wasn't given instructions yesterday. Nurse spoke with INR nurse and was informed that patient was given instructions twice yesterday. Nurse gave instructions of warfarin again and also informed her about her appointment on Tuesday.

## 2013-07-11 ENCOUNTER — Telehealth: Payer: Self-pay | Admitting: Cardiology

## 2013-07-11 NOTE — Telephone Encounter (Signed)
LM for pt to returncall

## 2013-07-11 NOTE — Telephone Encounter (Signed)
Wanda Mckenzie was calling per instructions to give BP readings. Please call her at home.

## 2013-07-12 ENCOUNTER — Ambulatory Visit (INDEPENDENT_AMBULATORY_CARE_PROVIDER_SITE_OTHER): Payer: 59 | Admitting: *Deleted

## 2013-07-12 DIAGNOSIS — Z7901 Long term (current) use of anticoagulants: Secondary | ICD-10-CM

## 2013-07-12 DIAGNOSIS — I6350 Cerebral infarction due to unspecified occlusion or stenosis of unspecified cerebral artery: Secondary | ICD-10-CM

## 2013-07-12 DIAGNOSIS — I639 Cerebral infarction, unspecified: Secondary | ICD-10-CM

## 2013-07-12 DIAGNOSIS — I4891 Unspecified atrial fibrillation: Secondary | ICD-10-CM

## 2013-07-12 LAB — POCT INR: INR: 1.7

## 2013-07-12 NOTE — Telephone Encounter (Signed)
Pt coming to office today around 2PM. Pt will bring BP readings with her then.

## 2013-11-20 ENCOUNTER — Encounter: Payer: Self-pay | Admitting: Cardiology

## 2013-11-20 ENCOUNTER — Inpatient Hospital Stay (HOSPITAL_COMMUNITY)
Admission: AD | Admit: 2013-11-20 | Discharge: 2013-11-24 | DRG: 287 | Disposition: A | Discharge status: Home / Self Care | Payer: Medicare HMO | Source: Other Acute Inpatient Hospital | Attending: Cardiovascular Disease | Admitting: Cardiovascular Disease

## 2013-11-20 ENCOUNTER — Encounter (HOSPITAL_COMMUNITY): Payer: Self-pay | Admitting: Family Medicine

## 2013-11-20 DIAGNOSIS — R791 Abnormal coagulation profile: Secondary | ICD-10-CM | POA: Diagnosis present

## 2013-11-20 DIAGNOSIS — Z7901 Long term (current) use of anticoagulants: Secondary | ICD-10-CM

## 2013-11-20 DIAGNOSIS — R9430 Abnormal result of cardiovascular function study, unspecified: Secondary | ICD-10-CM

## 2013-11-20 DIAGNOSIS — I10 Essential (primary) hypertension: Secondary | ICD-10-CM

## 2013-11-20 DIAGNOSIS — I482 Chronic atrial fibrillation, unspecified: Secondary | ICD-10-CM | POA: Diagnosis present

## 2013-11-20 DIAGNOSIS — I498 Other specified cardiac arrhythmias: Secondary | ICD-10-CM

## 2013-11-20 DIAGNOSIS — E876 Hypokalemia: Secondary | ICD-10-CM | POA: Diagnosis present

## 2013-11-20 DIAGNOSIS — I351 Nonrheumatic aortic (valve) insufficiency: Secondary | ICD-10-CM

## 2013-11-20 DIAGNOSIS — E785 Hyperlipidemia, unspecified: Secondary | ICD-10-CM | POA: Diagnosis present

## 2013-11-20 DIAGNOSIS — F0390 Unspecified dementia without behavioral disturbance: Secondary | ICD-10-CM | POA: Diagnosis present

## 2013-11-20 DIAGNOSIS — Z8673 Personal history of transient ischemic attack (TIA), and cerebral infarction without residual deficits: Secondary | ICD-10-CM

## 2013-11-20 DIAGNOSIS — I495 Sick sinus syndrome: Principal | ICD-10-CM | POA: Diagnosis present

## 2013-11-20 DIAGNOSIS — I359 Nonrheumatic aortic valve disorder, unspecified: Secondary | ICD-10-CM

## 2013-11-20 DIAGNOSIS — J449 Chronic obstructive pulmonary disease, unspecified: Secondary | ICD-10-CM | POA: Diagnosis present

## 2013-11-20 DIAGNOSIS — J4489 Other specified chronic obstructive pulmonary disease: Secondary | ICD-10-CM | POA: Diagnosis present

## 2013-11-20 DIAGNOSIS — I639 Cerebral infarction, unspecified: Secondary | ICD-10-CM

## 2013-11-20 DIAGNOSIS — I4891 Unspecified atrial fibrillation: Secondary | ICD-10-CM

## 2013-11-20 DIAGNOSIS — R55 Syncope and collapse: Secondary | ICD-10-CM | POA: Diagnosis present

## 2013-11-20 DIAGNOSIS — F05 Delirium due to known physiological condition: Secondary | ICD-10-CM

## 2013-11-20 DIAGNOSIS — R079 Chest pain, unspecified: Secondary | ICD-10-CM | POA: Diagnosis present

## 2013-11-20 DIAGNOSIS — R001 Bradycardia, unspecified: Secondary | ICD-10-CM

## 2013-11-20 DIAGNOSIS — R110 Nausea: Secondary | ICD-10-CM | POA: Diagnosis not present

## 2013-11-20 LAB — COMPREHENSIVE METABOLIC PANEL
ALT: 28 U/L (ref 0–35)
AST: 35 U/L (ref 0–37)
Albumin: 4 g/dL (ref 3.5–5.2)
Alkaline Phosphatase: 72 U/L (ref 39–117)
BILIRUBIN TOTAL: 1.2 mg/dL (ref 0.3–1.2)
BUN: 16 mg/dL (ref 6–23)
CO2: 28 mEq/L (ref 19–32)
Calcium: 9.5 mg/dL (ref 8.4–10.5)
Chloride: 104 mEq/L (ref 96–112)
Creatinine, Ser: 0.77 mg/dL (ref 0.50–1.10)
GFR, EST NON AFRICAN AMERICAN: 80 mL/min — AB (ref >90)
Glucose, Bld: 103 mg/dL — ABNORMAL HIGH (ref 70–99)
POTASSIUM: 3.5 meq/L — AB (ref 3.7–5.3)
SODIUM: 144 meq/L (ref 137–147)
Total Protein: 7 g/dL (ref 6.0–8.3)

## 2013-11-20 LAB — MRSA PCR SCREENING: MRSA BY PCR: NEGATIVE

## 2013-11-20 LAB — PRO B NATRIURETIC PEPTIDE: Pro B Natriuretic peptide (BNP): 1146 pg/mL — ABNORMAL HIGH (ref 0–450)

## 2013-11-20 LAB — TSH: TSH: 3.13 u[IU]/mL (ref 0.350–4.500)

## 2013-11-20 LAB — TROPONIN I: Troponin I: <0.3 ng/mL (ref <0.30)

## 2013-11-20 LAB — MAGNESIUM: MAGNESIUM: 1.8 mg/dL (ref 1.5–2.5)

## 2013-11-20 MED ORDER — SODIUM CHLORIDE 0.9 % IJ SOLN
3.0000 mL | Freq: Two times a day (BID) | INTRAMUSCULAR | Status: DC
  Administered 2013-11-20 – 2013-11-23 (×5): 3 mL via INTRAVENOUS

## 2013-11-20 MED ORDER — SODIUM CHLORIDE 0.9 % IJ SOLN: 3.0000 mL | INTRAMUSCULAR | Status: DC | PRN

## 2013-11-20 MED ORDER — FENOFIBRATE 160 MG PO TABS
160.0000 mg | ORAL_TABLET | Freq: Every day | ORAL | Status: DC
  Administered 2013-11-21 – 2013-11-24 (×4): 160 mg via ORAL
  Filled 2013-11-20 (×4): qty 1

## 2013-11-20 MED ORDER — OMEGA-3-ACID ETHYL ESTERS 1 G PO CAPS
2.0000 g | ORAL_CAPSULE | Freq: Two times a day (BID) | ORAL | Status: DC
  Administered 2013-11-20 – 2013-11-24 (×7): 2 g via ORAL
  Filled 2013-11-20 (×10): qty 2

## 2013-11-20 MED ORDER — ATORVASTATIN CALCIUM 80 MG PO TABS
80.0000 mg | ORAL_TABLET | Freq: Every day | ORAL | Status: DC
  Administered 2013-11-20 – 2013-11-23 (×4): 80 mg via ORAL
  Filled 2013-11-20 (×6): qty 1

## 2013-11-20 MED ORDER — FUROSEMIDE 20 MG PO TABS
20.0000 mg | ORAL_TABLET | Freq: Every day | ORAL | Status: DC
  Administered 2013-11-21 – 2013-11-23 (×3): 20 mg via ORAL
  Filled 2013-11-20 (×4): qty 1

## 2013-11-20 MED ORDER — PNEUMOCOCCAL VAC POLYVALENT 25 MCG/0.5ML IJ INJ
0.5000 mL | INJECTION | INTRAMUSCULAR | Status: AC
  Administered 2013-11-21: 0.5 mL via INTRAMUSCULAR
  Filled 2013-11-20: qty 0.5

## 2013-11-20 MED ORDER — FA-PYRIDOXINE-CYANOCOBALAMIN 2.5-25-2 MG PO TABS
1.0000 | ORAL_TABLET | Freq: Every day | ORAL | Status: DC
  Administered 2013-11-21 – 2013-11-24 (×3): 1 via ORAL
  Filled 2013-11-20 (×4): qty 1

## 2013-11-20 MED ORDER — ADULT MULTIVITAMIN W/MINERALS CH
1.0000 | ORAL_TABLET | Freq: Every day | ORAL | Status: DC
  Administered 2013-11-21 – 2013-11-24 (×3): 1 via ORAL
  Filled 2013-11-20 (×4): qty 1

## 2013-11-20 MED ORDER — SODIUM CHLORIDE 0.9 % IV SOLN: 250.0000 mL | INTRAVENOUS | Status: DC | PRN

## 2013-11-20 NOTE — H&P (Signed)
ZALA DEGRASSE is an 78 y.o. female.     Chief Complaint: Symptomatic bradycardia Primary cardiologist: Dr. Myrtis Ser HPI: Ms. Jeaneen Cala is a 78 yo woman with PMH of prior CVA, mild AI, COPD, atrial fibrillation on warfarin chronically, hypertension who presents to [facility] with syncope at church. She tells me she's had some dizziness spells from time to time over the last several weeks to few months. Initially, she didn't remember she was at church this morning but when I reminded her today was Sunday (she seemed to think it was Monday and she knew she had been at another hospital) she said she had gotten light-headed at church and perhaps passed out. I spoke briefly on the phone with her son who will be coming in from [place] tomorrow. She otherwise does not detail her passing out story earlier only from the chart I surmise that she was at Sunday School and had a witnessed LOC. No trauma noted on note or brief exam. She denies chest pain or other episodes of LOC/syncope. She remembers to take her medications at times.   Past Medical History  Diagnosis Date  . Chest pain     Cardiolyte...05/2005...borderline abnormalities/Carotid doppler....01/2005...no abnormality..  . Dyslipidemia   . CVA (cerebral infarction)     old Right middle cerebral artery infarct ..probably from atrial fib....no PFO by TEE then  . Aortic insufficiency     mild...echo....11/2005/EF  55-60%  ..echo..11/2005..   . Flushing     niaspan  . Hypotension     unexplained episode in the past...pheo ruled out.  Marland Kitchen COPD (chronic obstructive pulmonary disease)     by CXray  . Atrial fibrillation     Coumadin therapy  . Ejection fraction     EF 55-60%, echo, 2007  . Carotid bruit     Doppler, 2006, no abnormality  . Warfarin anticoagulation   . Hypertension   . Shortness of breath     with exertion  . Stroke     X's 2.  No residual per patient.    Past Surgical History  Procedure Laterality Date  .  Tributary varicosities of right leg  05/23/2002  . Left hip hemiarthroplasty    . Fracture surgery Left     Pt. fell and Ankle  . Abdominal hysterectomy  1975  . Vein ligation and stripping Right 02/08/2013    Procedure: Segmental excision of painful varicose veins right lower extremity;  Surgeon: Chuck Hint, MD;  Location: [facility] OR;  Service: Vascular;  Laterality: Right;    Family History  Problem Relation Age of Onset  . Stroke Father   . Cancer Mother   . Hypertension Mother   . Cancer Sister   . Cancer Sister   . Cancer Sister    Social History:  reports that she has never smoked. She has never used smokeless tobacco. She reports that she does not drink alcohol or use illicit drugs.  Allergies:  Allergies  Allergen Reactions  . Codeine     REACTION: vomiting    Medications Prior to Admission  Medication Sig Dispense Refill  . alendronate (FOSAMAX) 70 MG tablet Take 70 mg by mouth every 7 (seven) days. Take with a full glass of water on an empty stomach.      Marland Kitchen atenolol (TENORMIN) 25 MG tablet Take 25 mg by mouth daily.       Marland Kitchen atorvastatin (LIPITOR) 80 MG tablet Take 80 mg by mouth daily.        Marland Kitchen  calcium carbonate (OS-CAL) 600 MG TABS Take 600 mg by mouth 2 (two) times daily with a meal.       . diltiazem (CARDIZEM) 120 MG tablet Take 120 mg by mouth 2 (two) times daily.       . fenofibrate 160 MG tablet Take 160 mg by mouth daily.        . fish oil-omega-3 fatty acids 1000 MG capsule Take 2 g by mouth 2 (two) times daily.      . Folic Acid-Vit B6-Vit B12 (FOLBEE) 2.5-25-1 MG TABS Take 1 tablet by mouth daily.        . furosemide (LASIX) 20 MG tablet Take 20 mg by mouth daily.      . isosorbide mononitrate (IMDUR) 30 MG 24 hr tablet Take 30 mg by mouth daily.       . Multiple Vitamin (MULTIVITAMIN) tablet Take 1 tablet by mouth daily.        . potassium chloride (KLOR-CON) 20 MEQ packet Take 20 mEq by mouth 2 (two) times daily.      Marland Kitchen warfarin (COUMADIN) 5 MG  tablet Take 2.5-5 mg by mouth daily. Takes 5 mg on Sunday, Tuesday and Thursday and 2.5 mg all other days        No results found for this or any previous visit (from the past 48 hour(s)). No results found.  Review of Systems  Constitutional: Positive for malaise/fatigue. Negative for fever, chills and diaphoresis.  HENT: Negative for ear pain.   Eyes: Negative for double vision and pain.  Respiratory: Negative for cough and hemoptysis.   Cardiovascular: Negative for chest pain, orthopnea and claudication.  Gastrointestinal: Negative for nausea, vomiting and constipation.  Genitourinary: Negative for dysuria and hematuria.  Musculoskeletal: Negative for myalgias and neck pain.  Skin: Negative for rash.  Neurological: Positive for dizziness and loss of consciousness. Negative for tingling, tremors, sensory change and headaches.  Endo/Heme/Allergies: Negative for polydipsia. Bruises/bleeds easily.  Psychiatric/Behavioral: Negative for depression, suicidal ideas, hallucinations and substance abuse.    Blood pressure 178/88, pulse 52, temperature 97.6 F (36.4 C), temperature source Oral, resp. rate 14, height 5\' 8"  (1.727 m), weight 89 kg (196 lb 3.4 oz), SpO2 97.00%. Physical Exam  Nursing note and vitals reviewed. Constitutional: She appears well-developed and well-nourished. No distress.  HENT:  Head: Normocephalic and atraumatic.  Nose: Nose normal.  Mouth/Throat: Oropharynx is clear and moist. No oropharyngeal exudate.  Eyes: Conjunctivae and EOM are normal. Pupils are equal, round, and reactive to light. No scleral icterus.  Neck: Normal range of motion. Neck supple. No JVD present. No tracheal deviation present.  Cardiovascular: Intact distal pulses.   Murmur heard. Irregularly irregular, bradycardic, SEM at LSB  Respiratory: Effort normal and breath sounds normal. No respiratory distress. She has no wheezes. She has no rales.  GI: Soft. Bowel sounds are normal. She exhibits  no distension. There is no tenderness. There is no rebound.  Musculoskeletal: Normal range of motion. She exhibits no edema and no tenderness.  Neurological: She is alert. No cranial nerve deficit. Coordination normal.  Alert, oriented to person, year, place - confused on day and didn't remember she was at church earlier today  Skin: Skin is warm and dry. No rash noted. She is not diaphoretic. No erythema.  Psychiatric: She has a normal mood and affect. Her behavior is normal. Thought content normal.    Labs reviewed from [facility]; na 141, K 3.3 (repleted with 20 meq KCL), bun/cr 20/1.07, cl 101, bicarb 29,  ast/alt 39/30, albumin 4, INR 1.2, wbc 8.1, h/h 13.5/40, plt 275, trop 0.02   ECG reviewed; slow atrial fibrillation, no pauses longer ~ 2.0 seconds noted on multiple strips and ECGs Echo 6/07: EF 55-60%  Problem List Symptomatic Bradycardia Hypertension Atrial fibrillation on chronic anticoagulation  Mild Aortic Insufficiency  Dyslipidemia  ? Confusion Systolic murmur   Assessment/Plan Ms. Delton Seeelson is a 78 yo woman with PMH of HTN, previous CVA, mild AI COPD, atrial fibrillation on chronic warfarin therapy who presents with symptomatic bradycardia and syncope. We will hold her AV nodal blockers - atenolol and diltiazem and watch her on the monitor for any significant blocks/pauses. She may need PPM. Differential for syncope is bradycardia, tachyarrhythmias, valvular stenosis, vasovagal, medications, among other etiologies. Will watch on tele, update echocardiogram, hold AV nodal blockers - admit to telemetry, hold AV nodal blocking agents (she's on home atenolol 25 mg daily and diltiazem 120 mg daily) - hold warfarin in case of procedures (INR noted at 1.2)  - will obtain CTH noncontrasted given subtle confusion and LOC - no evidence of CTH at [facility] on chart review  - diet now, NPO after MN so diet will need to be restarted in AM based on daily needs  - update echocardiogram     Leeann MustJacob Aeon Koors 11/20/2013, 9:36 PM

## 2013-11-21 ENCOUNTER — Inpatient Hospital Stay (HOSPITAL_COMMUNITY): Payer: Medicare HMO

## 2013-11-21 DIAGNOSIS — I359 Nonrheumatic aortic valve disorder, unspecified: Secondary | ICD-10-CM

## 2013-11-21 DIAGNOSIS — I6350 Cerebral infarction due to unspecified occlusion or stenosis of unspecified cerebral artery: Secondary | ICD-10-CM | POA: Diagnosis not present

## 2013-11-21 DIAGNOSIS — I10 Essential (primary) hypertension: Secondary | ICD-10-CM | POA: Diagnosis not present

## 2013-11-21 DIAGNOSIS — Z9889 Other specified postprocedural states: Secondary | ICD-10-CM

## 2013-11-21 DIAGNOSIS — R55 Syncope and collapse: Secondary | ICD-10-CM | POA: Diagnosis not present

## 2013-11-21 DIAGNOSIS — E785 Hyperlipidemia, unspecified: Secondary | ICD-10-CM | POA: Diagnosis not present

## 2013-11-21 HISTORY — DX: Other specified postprocedural states: Z98.890

## 2013-11-21 LAB — CBC
HCT: 41.1 % (ref 36.0–46.0)
HEMOGLOBIN: 14.3 g/dL (ref 12.0–15.0)
MCH: 32.1 pg (ref 26.0–34.0)
MCHC: 34.8 g/dL (ref 30.0–36.0)
MCV: 92.4 fL (ref 78.0–100.0)
Platelets: 283 10*3/uL (ref 150–400)
RBC: 4.45 MIL/uL (ref 3.87–5.11)
RDW: 13.1 % (ref 11.5–15.5)
WBC: 7.5 10*3/uL (ref 4.0–10.5)

## 2013-11-21 LAB — HEMOGLOBIN A1C
Hgb A1c MFr Bld: 5.9 % — ABNORMAL HIGH (ref <5.7)
MEAN PLASMA GLUCOSE: 123 mg/dL — AB (ref <117)

## 2013-11-21 LAB — BASIC METABOLIC PANEL
BUN: 14 mg/dL (ref 6–23)
CALCIUM: 9.8 mg/dL (ref 8.4–10.5)
CHLORIDE: 102 meq/L (ref 96–112)
CO2: 27 mEq/L (ref 19–32)
CREATININE: 0.74 mg/dL (ref 0.50–1.10)
GFR calc Af Amer: >90 mL/min (ref >90)
GFR calc non Af Amer: 81 mL/min — ABNORMAL LOW (ref >90)
Glucose, Bld: 97 mg/dL (ref 70–99)
Potassium: 3.3 mEq/L — ABNORMAL LOW (ref 3.7–5.3)
Sodium: 144 mEq/L (ref 137–147)

## 2013-11-21 LAB — LIPID PANEL
CHOLESTEROL: 118 mg/dL (ref 0–200)
HDL: 44 mg/dL (ref >39)
LDL Cholesterol: 54 mg/dL (ref 0–99)
Total CHOL/HDL Ratio: 2.7 RATIO
Triglycerides: 100 mg/dL (ref <150)
VLDL: 20 mg/dL (ref 0–40)

## 2013-11-21 LAB — TROPONIN I: Troponin I: <0.3 ng/mL (ref <0.30)

## 2013-11-21 LAB — PROTIME-INR
INR: 1.21 (ref 0.00–1.49)
PROTHROMBIN TIME: 15 s (ref 11.6–15.2)

## 2013-11-21 MED ORDER — POTASSIUM CHLORIDE CRYS ER 20 MEQ PO TBCR
40.0000 meq | EXTENDED_RELEASE_TABLET | Freq: Once | ORAL | Status: AC
  Administered 2013-11-21: 40 meq via ORAL
  Filled 2013-11-21: qty 2

## 2013-11-21 MED ORDER — LOSARTAN POTASSIUM 25 MG PO TABS
25.0000 mg | ORAL_TABLET | Freq: Every day | ORAL | Status: DC
  Administered 2013-11-21 – 2013-11-22 (×2): 25 mg via ORAL
  Filled 2013-11-21 (×2): qty 1

## 2013-11-21 MED ORDER — HYDRALAZINE HCL 20 MG/ML IJ SOLN
10.0000 mg | Freq: Four times a day (QID) | INTRAMUSCULAR | Status: DC | PRN
  Administered 2013-11-21 – 2013-11-22 (×3): 10 mg via INTRAVENOUS
  Filled 2013-11-21 (×3): qty 1

## 2013-11-21 MED ORDER — ACETAMINOPHEN 325 MG PO TABS
650.0000 mg | ORAL_TABLET | Freq: Four times a day (QID) | ORAL | Status: DC | PRN
  Administered 2013-11-21 – 2013-11-22 (×3): 650 mg via ORAL
  Filled 2013-11-21 (×2): qty 2

## 2013-11-21 NOTE — Progress Notes (Signed)
Echocardiogram 2D Echocardiogram has been performed.  Wanda Mckenzie 11/21/2013, 11:58 AM

## 2013-11-21 NOTE — Progress Notes (Signed)
Patient Name: Wanda Mckenzie Date of Encounter: 11/21/2013   Principal Problem:   Syncope Active Problems:   Symptomatic bradycardia   Atrial fibrillation   Hypertension   Dyslipidemia    SUBJECTIVE  No recurrent pre-syncope/syncope overnight.  No c/p or dyspnea. Initially didn't remember syncope yesterday.  Forgot that she was @ church.  Multiple pauses in the 2 second range - longest 2.33 secs late last night.  BP high off of meds.  CURRENT MEDS . atorvastatin  80 mg Oral q1800  . fenofibrate  160 mg Oral Daily  . folic acid-pyridoxine-cyancobalamin  1 tablet Oral Daily  . furosemide  20 mg Oral Daily  . multivitamin with minerals  1 tablet Oral Daily  . omega-3 acid ethyl esters  2 g Oral BID  . pneumococcal 23 valent vaccine  0.5 mL Intramuscular Tomorrow-1000  . potassium chloride  40 mEq Oral Once  . sodium chloride  3 mL Intravenous Q12H    OBJECTIVE  Filed Vitals:   11/21/13 0500 11/21/13 0715 11/21/13 0730 11/21/13 0735  BP:  194/78 181/90 176/86  Pulse:  64 62 67  Temp:      TempSrc:      Resp:  17 16 15   Height:      Weight: 196 lb 3.4 oz (89 kg)     SpO2:  94% 93% 94%    Intake/Output Summary (Last 24 hours) at 11/21/13 0814 Last data filed at 11/21/13 0426  Gross per 24 hour  Intake      0 ml  Output   1500 ml  Net  -1500 ml   Filed Weights   11/20/13 2020 11/21/13 0500  Weight: 196 lb 3.4 oz (89 kg) 196 lb 3.4 oz (89 kg)    PHYSICAL EXAM  General: Pleasant, NAD. Neuro: Alert and oriented X 3. Moves all extremities spontaneously. Psych: Normal affect. HEENT:  Normal  Neck: Supple without bruits or JVD. Lungs:  Resp regular and unlabored, CTA. Heart: IR, IR, no s3, s4.   Abdomen: Soft, non-tender, non-distended, BS + x 4.  Extremities: No clubbing, cyanosis or edema. DP/PT/Radials 2+ and equal bilaterally.  Accessory Clinical Findings  CBC  Recent Labs  11/21/13 0420  WBC 7.5  HGB 14.3  HCT 41.1  MCV 92.4  PLT 283   Basic  Metabolic Panel  Recent Labs  11/20/13 2227 11/21/13 0420  NA 144 144  K 3.5* 3.3*  CL 104 102  CO2 28 27  GLUCOSE 103* 97  BUN 16 14  CREATININE 0.77 0.74  CALCIUM 9.5 9.8  MG 1.8  --    Liver Function Tests  Recent Labs  11/20/13 2227  AST 35  ALT 28  ALKPHOS 72  BILITOT 1.2  PROT 7.0  ALBUMIN 4.0   Cardiac Enzymes  Recent Labs  11/20/13 2227 11/21/13 0420  TROPONINI <0.30 <0.30   Fasting Lipid Panel  Recent Labs  11/21/13 0420  CHOL 118  HDL 44  LDLCALC 54  TRIG 100  CHOLHDL 2.7   Thyroid Function Tests  Recent Labs  11/20/13 2227  TSH 3.130   Lab Results  Component Value Date   INR 1.21 11/21/2013   INR 1.7 07/12/2013   INR 6.0 07/06/2013    TELE  afib - 50's to 90's.  Multiple pauses in the 2 second range.  Longest 2.33 secs.  Radiology/Studies  Ct Head Wo Contrast  11/21/2013   CLINICAL DATA:  Syncope  EXAM: CT HEAD WITHOUT CONTRAST  TECHNIQUE:  Contiguous axial images were obtained from the base of the skull through the vertex without intravenous contrast.  COMPARISON:  Prior CT from 02/01/2007  FINDINGS: Encephalomalacia within the right frontal lobe is again seen, compatible with remote infarct. Additional remote infarct within the right PCA territory seen as well, also stable.  No acute intracranial hemorrhage or large vessel territory infarct identified. Scattered and confluent hypodensity within the periventricular and deep white matter of both cerebral hemispheres is most consistent with chronic small vessel ischemic changes. No mass lesion, midline shift, or hydrocephalus. No extra-axial fluid collection.  Calvarium is intact. Scalp soft tissues normal. No acute abnormality seen about the orbits.  Paranasal sinuses and mastoid air cells are clear.  IMPRESSION: 1. No acute intracranial abnormality. 2. Remote right frontal and occipital lobe infarcts.   Electronically Signed   By: Rise Mu M.D.   On: 11/21/2013 04:23   ASSESSMENT  AND PLAN  1.  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  She does not remember losing consciousness but says that she became lightheaded and someone called 911. Per [facility] records, she was witnessed to have lost consciousness.  No recurrence of pre-syncope/syncope overnight.  She has had multiple pauses in the 2 second range (longest 2.33 secs) but nothing symptomatic or > 3 secs.  Her home doses of atenolol and diltiazem are on hold.  In that setting BP is high.  She remains hypokalemic - will supplement.  TSH wnl.  Echo pending.  Will ambulate this morning to assess chronotropic competence and ask EP to see re: possible tachy-brady with need for AVN blocking agents with resultant symptomatic bradycardia.  2.  Afib - ? Tachy-brady syndrome:  As above.  BB/CCB currently on hold.  Follow HR.  Will ask EP to see. Although coumadin is listed as a home med, her INR was only 1.2 yesterday and is the same today.  I asked her where she is having her INR followed and she said that she is going to the [place] office for this.  Her last visit there was 07/12/2013.  I then asked if she is still taking coumadin and she said that she doesn't think that she is.  It appears that her dementia makes her a very poor coumadin candidate going forward.  CHA2DS2VASc =  6.  Perhaps we should consider a NOAC instead.  3.  HTN:  In setting of atenolol and dilt on hold, pressures have been high.  Will add losartan for now.  4.  HL:  LDL 54.  Trig 100.  Cont statin/fibrate.  LFT's wnl.  Signed, Ok Anis NP   I have seen and evaluated the patient this AM along with Mr. Brion Aliment, NP. I agree with his findings, examination as well as impression recommendations.  Pt with known Afib - on warfarin admitted for syncope - denies CHF or Angina Sx.  -- EP consulted. I agree that for now, warfarin may not be most protective Rx.  -- Will need to confirm no CVA since INR is  sub-therapeutic.  NOAC may be better long term Rx.  ProBNP is up a bit - Echo pending, no reported h/o CHF.  No SSx of CHF.  BP notably elevated with BB & CCB being held - ARB started, will use PRN IV Hydralazine as well for additional control.   Marykay Lex, M.D., M.S. Interventional Cardiologist   Pager # [phone] 11/21/2013

## 2013-11-21 NOTE — Consult Note (Signed)
ELECTROPHYSIOLOGY CONSULT NOTE   Patient ID: Wanda Mckenzie MRN: [id number], DOB/AGE: 1935/12/03   Admit date: 11/20/2013 Date of Consult: 11/21/2013  Primary Physician: Edrick Oh, MD Primary Cardiologist: Ron Parker, MD Reason for Consultation: Syncope  History of Present Illness Wanda Mckenzie is a 78 y.o. female with permanent atrial fibrillation, prior CVA, HTN and COPD who was admitted yesterday with syncope. She tells me she was in Sunday School, seated at table, when she began feeling dizzy, nauseated and hot. She became diaphoretic and was fanning herself when others in the class told her she also looked pale. She was going to lay down and she remembers hearing someone say they were going to "call the rescue squad" but she apparently lost consciousness as she doesn't recall any events after that. She denies CP, SOB or palpitations. On admission, she was bradycardic. Diltiazem and atenolol have been held. TSH is within normal range. Telemetry shows AFib with controlled V rates in the 60s-70s.    She has had similar spells in the past which have been not so severe   There is no evidence of significant bradycardia, AV block or pause. Echo pending. While here, there has been some concern regarding her memory and medication compliance, specifically with Coumadin. She tells me she is compliant and Dr. Edrick Oh is following Coumadin. She administers her own medications and uses a weekly pill box.   She also has DOE < i flt of stairs, and worsening peripheral edema of late  She denies chest pain    Past Medical History Past Medical History  Diagnosis Date  . Chest pain     Cardiolyte...05/2005...borderline abnormalities/Carotid doppler....01/2005...no abnormality..  . Dyslipidemia   . CVA (cerebral infarction)     old Right middle cerebral artery infarct ..probably from atrial fib....no PFO by TEE then  . Aortic insufficiency     mild...echo....11/2005/EF  55-60%  ..echo..11/2005..   .  Flushing     niaspan  . Hypotension     unexplained episode in the past...pheo ruled out.  Marland Kitchen COPD (chronic obstructive pulmonary disease)     by CXray  . Atrial fibrillation     Coumadin therapy  . Ejection fraction     EF 55-60%, echo, 2007  . Carotid bruit     Doppler, 2006, no abnormality  . Warfarin anticoagulation   . Hypertension   . Shortness of breath     with exertion  . Stroke     X's 2.  No residual per patient.    Past Surgical History Past Surgical History  Procedure Laterality Date  . Tributary varicosities of right leg  05/23/2002  . Left hip hemiarthroplasty    . Fracture surgery Left     Pt. fell and Ankle  . Abdominal hysterectomy  1975  . Vein ligation and stripping Right 02/08/2013    Procedure: Segmental excision of painful varicose veins right lower extremity;  Surgeon: Angelia Mould, MD;  Location: [facility];  Service: Vascular;  Laterality: Right;    Allergies/Intolerances Allergies  Allergen Reactions  . Codeine     REACTION: vomiting    Current Home Medications      alendronate 70 MG tablet  Commonly known as:  FOSAMAX  - Take 70 mg by mouth every 7 (seven) days. Take with a full glass of water on an empty stomach. On sundays     atenolol 25 MG tablet  Commonly known as:  TENORMIN  Take 25 mg by mouth daily.  atorvastatin 80 MG tablet  Commonly known as:  LIPITOR  Take 80 mg by mouth daily.     calcium carbonate 600 MG Tabs tablet  Commonly known as:  OS-CAL  Take 600 mg by mouth 2 (two) times daily with a meal.     diltiazem 120 MG tablet  Commonly known as:  CARDIZEM  Take 120 mg by mouth 2 (two) times daily.     fenofibrate 160 MG tablet  Take 160 mg by mouth daily.     Folic Acid-Vit Q9-UTM L46 2.5-25-1 MG Tabs tablet  Commonly known as:  FOLBEE  Take 1 tablet by mouth daily.     furosemide 20 MG tablet  Commonly known as:  LASIX  Take 20 mg by mouth daily.     hydrochlorothiazide 25 MG tablet  Commonly known  as:  HYDRODIURIL  Take 25 mg by mouth daily.     isosorbide mononitrate 30 MG 24 hr tablet  Commonly known as:  IMDUR  Take 30 mg by mouth daily.     meclizine 25 MG tablet  Commonly known as:  ANTIVERT  Take 25 mg by mouth every 8 (eight) hours as needed for dizziness.     potassium chloride SA 20 MEQ tablet  Commonly known as:  K-DUR,KLOR-CON  Take 20 mEq by mouth 2 (two) times daily.     warfarin 5 MG tablet  Commonly known as:  COUMADIN  Take 2.5-5 mg by mouth daily. Takes 5 mg on Sunday, Tuesday and Thursday and 2.5 mg all other days     Inpatient Medications . atorvastatin  80 mg Oral q1800  . fenofibrate  160 mg Oral Daily  . folic acid-pyridoxine-cyancobalamin  1 tablet Oral Daily  . furosemide  20 mg Oral Daily  . losartan  25 mg Oral Daily  . multivitamin with minerals  1 tablet Oral Daily  . omega-3 acid ethyl esters  2 g Oral BID  . sodium chloride  3 mL Intravenous Q12H    Family History Family History  Problem Relation Age of Onset  . Stroke Father   . Cancer Mother   . Hypertension Mother   . Cancer Sister   . Cancer Sister   . Cancer Sister      Social History History   Social History  . Marital Status: Married    Spouse Name: N/A    Number of Children: N/A  . Years of Education: N/A   Occupational History  . Not on file.   Social History Main Topics  . Smoking status: Never Smoker   . Smokeless tobacco: Never Used  . Alcohol Use: No  . Drug Use: No  . Sexual Activity: Not on file   Other Topics Concern  . Not on file   Social History Narrative  . No narrative on file     Review of Systems General: No chills, fever, night sweats or weight changes  Cardiovascular:  No chest pain, dyspnea on exertion, edema, orthopnea, palpitations, paroxysmal nocturnal dyspnea Dermatological: No rash, lesions or masses Respiratory: No cough, dyspnea Urologic: No hematuria, dysuria Abdominal: No nausea, vomiting, diarrhea, bright red blood per  rectum, melena, or hematemesis Neurologic: No visual changes, weakness, changes in mental status All other systems reviewed and are otherwise negative except as noted above.  Physical Exam Vitals: Blood pressure 200/93, pulse 59, temperature 97.2 F (36.2 C), temperature source Oral, resp. rate 15, height '5\' 10"'  (1.778 m), weight 196 lb 3.4 oz (89 kg), SpO2 97.00%.  General: Well developed, well appearing 78 y.o. female in no acute distress. HEENT: Normocephalic, atraumatic. EOMs intact. Sclera nonicteric. Oropharynx clear.  Neck: Supple. No JVD. Lungs: Respirations regular and unlabored, CTA bilaterally. No wheezes, rales or rhonchi. Heart: Irregular. S1, S2 present. No murmurs, rub, S3 or S4. Abdomen: Soft, non-distended. BS present x 4 quadrants.  Extremities: No clubbing, cyanosis or edema. DP/PT/Radials 2+ and equal bilaterally. Psych: Normal affect. Neuro: Alert and oriented X 3. Moves all extremities spontaneously. Musculoskeletal: No kyphosis. Skin: Intact. Warm and dry. No rashes or petechiae in exposed areas.   Labs Labs from [facility] reviewed in full - Hgb 13.5, Hct 40.1, WBC 8100, Plts 275,000, sodium 141, potassium 3.3, BUN 20, Cr 1.07, AST 39, ALT 30, alk phos 64, albumin 4.0, BNP 260, magnesium 1.7, troponin 0.02 > 0.02 (11:40 then 17:40), INR 1.2  Recent Labs  11/20/13 2227 11/21/13 0420  TROPONINI <0.30 <0.30   Lab Results  Component Value Date   WBC 7.5 11/21/2013   HGB 14.3 11/21/2013   HCT 41.1 11/21/2013   MCV 92.4 11/21/2013   PLT 283 11/21/2013    Recent Labs Lab 11/20/13 2227 11/21/13 0420  NA 144 144  K 3.5* 3.3*  CL 104 102  CO2 28 27  BUN 16 14  CREATININE 0.77 0.74  CALCIUM 9.5 9.8  PROT 7.0  --   BILITOT 1.2  --   ALKPHOS 72  --   ALT 28  --   AST 35  --   GLUCOSE 103* 97   Lab Results  Component Value Date   CHOL 118 11/21/2013   HDL 44 11/21/2013   LDLCALC 54 11/21/2013   TRIG 100 11/21/2013    Recent Labs  11/20/13 2227  TSH 3.130     Recent Labs  11/21/13 0420  INR 1.21    Radiology/Studies Ct Head Wo Contrast  11/21/2013   CLINICAL DATA:  Syncope  EXAM: CT HEAD WITHOUT CONTRAST  TECHNIQUE: Contiguous axial images were obtained from the base of the skull through the vertex without intravenous contrast.  COMPARISON:  Prior CT from 02/01/2007  FINDINGS: Encephalomalacia within the right frontal lobe is again seen, compatible with remote infarct. Additional remote infarct within the right PCA territory seen as well, also stable.  No acute intracranial hemorrhage or large vessel territory infarct identified. Scattered and confluent hypodensity within the periventricular and deep white matter of both cerebral hemispheres is most consistent with chronic small vessel ischemic changes. No mass lesion, midline shift, or hydrocephalus. No extra-axial fluid collection.  Calvarium is intact. Scalp soft tissues normal. No acute abnormality seen about the orbits.  Paranasal sinuses and mastoid air cells are clear.  IMPRESSION: 1. No acute intracranial abnormality. 2. Remote right frontal and occipital lobe infarcts.   Electronically Signed   By: Jeannine Boga M.D.   On: 11/21/2013 04:23    Echocardiogram  Pending  12-lead ECG and strips from [facility] - atrial fibrillation with V rate 41 bpm 12-lead ECG yesterday on presentation to [facility] - AFib with V rate 53 bpm; inferior Q waves ( new 2006) Telemetry strips reviewed from [place] - AFib with slow V rate in 40s; no significant bradycardia, AV block or pause Telemetry here reviewed - atrial fibrillation with V rate 60-75bpm; no significant bradycardia, AV block or pause  Assessment and Plan Syncope Permanent atrial fibrillation Prior CVA Subtherapeutic INR Hypertension COPD Ms. Vanderhoef's heart rate has improved off atenolol and diltiazem over the last 24 hours. She  remains rate controlled currently with V rates 60-80 bpm. There is no evidence of significant  bradycardia, AV block or pause. No indication for PPM at this time. Will check orthostatics and ambulate with assistance to evaluate chronotropic response. Await echo and follow telemetry. I have also requested records from Dr. Murrell Redden office.   Signed, Andrez Grime, PA-C 11/21/2013, 10:57 AM   Pt with stereotypical episode with nausea diaphoresis pallor and lightheadedness followed by syncope  Her abiltiy to give history is poor;  VS from EMS are not likely illuminating given the fact that she had recovered    She says these have been occurring for about 5-10 yrs   She has heat intolerance  These most likely are neurally mediated,but ecg shows new Q waves in inferior leads from 2008  She has DOE as well so clarification of LV function and coronary perfusion is necessary . If both are normal, then it is reasonable to ascribe to nerually mediated reflex, but if not, then she will need cath and possibly EPS  Once this issue is resolved, the next two relate to afib  1) will the discontinuation of BB and CCB result in tachycardia that will prompt resumption of treatment and thereby necessitate pacing,  2) she needs anticoagulation and to take it responisibly  This may be helped by pharmacy consultation prior to discharge  Her CHADS-VAS score is 6 and her risks are high  Her dementia is also interfering with her compliance with meds.  She may benefit from namenda or aricept but both of these can aggravate braducardia  We will also want to simplify her meds prior to discharge

## 2013-11-22 ENCOUNTER — Inpatient Hospital Stay (HOSPITAL_COMMUNITY): Payer: Medicare HMO

## 2013-11-22 DIAGNOSIS — I4891 Unspecified atrial fibrillation: Secondary | ICD-10-CM

## 2013-11-22 DIAGNOSIS — R079 Chest pain, unspecified: Secondary | ICD-10-CM

## 2013-11-22 LAB — BASIC METABOLIC PANEL
BUN: 17 mg/dL (ref 6–23)
BUN: 22 mg/dL (ref 6–23)
CALCIUM: 10 mg/dL (ref 8.4–10.5)
CHLORIDE: 102 meq/L (ref 96–112)
CO2: 24 meq/L (ref 19–32)
CO2: 28 meq/L (ref 19–32)
CREATININE: 0.74 mg/dL (ref 0.50–1.10)
CREATININE: 1.34 mg/dL — AB (ref 0.50–1.10)
Calcium: 9.6 mg/dL (ref 8.4–10.5)
Chloride: 98 mEq/L (ref 96–112)
GFR calc Af Amer: >90 mL/min (ref >90)
GFR calc Af Amer: 44 mL/min — ABNORMAL LOW (ref >90)
GFR calc non Af Amer: 81 mL/min — ABNORMAL LOW (ref >90)
GFR, EST NON AFRICAN AMERICAN: 38 mL/min — AB (ref >90)
Glucose, Bld: 111 mg/dL — ABNORMAL HIGH (ref 70–99)
Glucose, Bld: 141 mg/dL — ABNORMAL HIGH (ref 70–99)
Potassium: 3.2 mEq/L — ABNORMAL LOW (ref 3.7–5.3)
Potassium: 3.6 mEq/L — ABNORMAL LOW (ref 3.7–5.3)
Sodium: 141 mEq/L (ref 137–147)
Sodium: 140 mEq/L (ref 137–147)

## 2013-11-22 LAB — CBC
HCT: 45.8 % (ref 36.0–46.0)
Hemoglobin: 16.1 g/dL — ABNORMAL HIGH (ref 12.0–15.0)
MCH: 32.3 pg (ref 26.0–34.0)
MCHC: 35.2 g/dL (ref 30.0–36.0)
MCV: 92 fL (ref 78.0–100.0)
PLATELETS: 331 10*3/uL (ref 150–400)
RBC: 4.98 MIL/uL (ref 3.87–5.11)
RDW: 13.6 % (ref 11.5–15.5)
WBC: 7.3 10*3/uL (ref 4.0–10.5)

## 2013-11-22 LAB — PROTIME-INR
INR: 1.19 (ref 0.00–1.49)
Prothrombin Time: 14.8 seconds (ref 11.6–15.2)

## 2013-11-22 MED ORDER — REGADENOSON 0.4 MG/5ML IV SOLN
0.4000 mg | Freq: Once | INTRAVENOUS | Status: AC
  Administered 2013-11-22: 0.4 mg via INTRAVENOUS

## 2013-11-22 MED ORDER — SODIUM CHLORIDE 0.9 % IV SOLN: 250.0000 mL | INTRAVENOUS | Status: DC | PRN

## 2013-11-22 MED ORDER — ACETAMINOPHEN 325 MG PO TABS
ORAL_TABLET | ORAL | Status: AC
  Filled 2013-11-22: qty 2

## 2013-11-22 MED ORDER — LOSARTAN POTASSIUM 50 MG PO TABS
50.0000 mg | ORAL_TABLET | Freq: Every day | ORAL | Status: DC
  Administered 2013-11-23 – 2013-11-24 (×2): 50 mg via ORAL
  Filled 2013-11-22 (×3): qty 1

## 2013-11-22 MED ORDER — LOSARTAN POTASSIUM 25 MG PO TABS
25.0000 mg | ORAL_TABLET | Freq: Once | ORAL | Status: AC
  Administered 2013-11-22: 25 mg via ORAL
  Filled 2013-11-22: qty 1

## 2013-11-22 MED ORDER — TECHNETIUM TC 99M SESTAMIBI GENERIC - CARDIOLITE
30.0000 | Freq: Once | INTRAVENOUS | Status: AC | PRN
  Administered 2013-11-22: 30 via INTRAVENOUS

## 2013-11-22 MED ORDER — REGADENOSON 0.4 MG/5ML IV SOLN
INTRAVENOUS | Status: AC
  Administered 2013-11-22: 0.4 mg via INTRAVENOUS
  Filled 2013-11-22: qty 5

## 2013-11-22 MED ORDER — SODIUM CHLORIDE 0.9 % IV SOLN
1.0000 mL/kg/h | INTRAVENOUS | Status: DC
  Administered 2013-11-23: 1 mL/kg/h via INTRAVENOUS

## 2013-11-22 MED ORDER — ONDANSETRON HCL 4 MG PO TABS
4.0000 mg | ORAL_TABLET | Freq: Once | ORAL | Status: AC
  Administered 2013-11-22: 4 mg via ORAL
  Filled 2013-11-22: qty 1

## 2013-11-22 MED ORDER — SODIUM CHLORIDE 0.9 % IJ SOLN
3.0000 mL | Freq: Two times a day (BID) | INTRAMUSCULAR | Status: DC
  Administered 2013-11-23 (×2): 3 mL via INTRAVENOUS

## 2013-11-22 MED ORDER — POTASSIUM CHLORIDE CRYS ER 20 MEQ PO TBCR
40.0000 meq | EXTENDED_RELEASE_TABLET | Freq: Once | ORAL | Status: AC
  Administered 2013-11-22: 40 meq via ORAL
  Filled 2013-11-22: qty 2

## 2013-11-22 MED ORDER — ASPIRIN 81 MG PO CHEW
81.0000 mg | CHEWABLE_TABLET | ORAL | Status: AC
  Administered 2013-11-23: 81 mg via ORAL
  Filled 2013-11-22: qty 1

## 2013-11-22 MED ORDER — METOPROLOL TARTRATE 12.5 MG HALF TABLET
12.5000 mg | ORAL_TABLET | Freq: Two times a day (BID) | ORAL | Status: DC
  Administered 2013-11-22 – 2013-11-24 (×4): 12.5 mg via ORAL
  Filled 2013-11-22 (×7): qty 1

## 2013-11-22 MED ORDER — ENOXAPARIN SODIUM 100 MG/ML ~~LOC~~ SOLN
1.0000 mg/kg | Freq: Two times a day (BID) | SUBCUTANEOUS | Status: DC
  Administered 2013-11-22: 85 mg via SUBCUTANEOUS
  Filled 2013-11-22 (×4): qty 1

## 2013-11-22 MED ORDER — TECHNETIUM TC 99M SESTAMIBI GENERIC - CARDIOLITE
10.0000 | Freq: Once | INTRAVENOUS | Status: AC | PRN
  Administered 2013-11-22: 10 via INTRAVENOUS

## 2013-11-22 MED ORDER — SODIUM CHLORIDE 0.9 % IJ SOLN: 3.0000 mL | INTRAMUSCULAR | Status: DC | PRN

## 2013-11-22 NOTE — Progress Notes (Signed)
Pt just returned to floor from having stress test.

## 2013-11-22 NOTE — Progress Notes (Signed)
Pt just left floor to have a lexiscan test. Pt's family following.

## 2013-11-22 NOTE — Progress Notes (Signed)
Nuclear medicine called and stated they are coming for pt to take her for a stress test.

## 2013-11-22 NOTE — Progress Notes (Signed)
ANTICOAGULATION CONSULT NOTE - Initial Consult  Pharmacy Consult for Lovenox  Indication: atrial fibrillation  Allergies  Allergen Reactions  . Codeine     REACTION: vomiting    Patient Measurements: Height: 5\' 10"  (177.8 cm) Weight: 187 lb 13.3 oz (85.2 kg) IBW/kg (Calculated) : 68.5 Heparin Dosing Weight: n/a   Vital Signs: Temp: 97.3 F (36.3 C) (06/02 1557) Temp src: Oral (06/02 1557) BP: 141/83 mmHg (06/02 1427) Pulse Rate: 96 (06/02 1427)  Labs:  Recent Labs  11/20/13 2227 11/21/13 0420 11/21/13 1053 11/22/13 0430  HGB  --  14.3  --   --   HCT  --  41.1  --   --   PLT  --  283  --   --   LABPROT  --  15.0  --   --   INR  --  1.21  --   --   CREATININE 0.77 0.74  --  0.74  TROPONINI <0.30 <0.30 <0.30  --     Estimated Creatinine Clearance: 72.1 ml/min (by C-G formula based on Cr of 0.74).   Medical History: Past Medical History  Diagnosis Date  . Chest pain     Cardiolyte...05/2005...borderline abnormalities/Carotid doppler....01/2005...no abnormality..  . Dyslipidemia   . CVA (cerebral infarction)     old Right middle cerebral artery infarct ..probably from atrial fib....no PFO by TEE then  . Aortic insufficiency     mild...echo....11/2005/EF  55-60%  ..echo..11/2005..   . Flushing     niaspan  . Hypotension     unexplained episode in the past...pheo ruled out.  Marland Kitchen COPD (chronic obstructive pulmonary disease)     by CXray  . Atrial fibrillation     Coumadin therapy  . Ejection fraction     EF 55-60%, echo, 2007  . Carotid bruit     Doppler, 2006, no abnormality  . Warfarin anticoagulation   . Hypertension   . Shortness of breath     with exertion  . Stroke     X's 2.  No residual per patient.    Medications:  Prescriptions prior to admission  Medication Sig Dispense Refill  . alendronate (FOSAMAX) 70 MG tablet Take 70 mg by mouth every 7 (seven) days. Take with a full glass of water on an empty stomach. On sundays      . atenolol  (TENORMIN) 25 MG tablet Take 25 mg by mouth daily.       Marland Kitchen atorvastatin (LIPITOR) 80 MG tablet Take 80 mg by mouth daily.        . calcium carbonate (OS-CAL) 600 MG TABS Take 600 mg by mouth 2 (two) times daily with a meal.       . diltiazem (CARDIZEM) 120 MG tablet Take 120 mg by mouth 2 (two) times daily.       . fenofibrate 160 MG tablet Take 160 mg by mouth daily.        . Folic Acid-Vit B6-Vit B12 (FOLBEE) 2.5-25-1 MG TABS Take 1 tablet by mouth daily.        . furosemide (LASIX) 20 MG tablet Take 20 mg by mouth daily.      . hydrochlorothiazide (HYDRODIURIL) 25 MG tablet Take 25 mg by mouth daily.      . isosorbide mononitrate (IMDUR) 30 MG 24 hr tablet Take 30 mg by mouth daily.       . meclizine (ANTIVERT) 25 MG tablet Take 25 mg by mouth every 8 (eight) hours as needed for dizziness.      Marland Kitchen  potassium chloride SA (K-DUR,KLOR-CON) 20 MEQ tablet Take 20 mEq by mouth 2 (two) times daily.      Marland Kitchen. warfarin (COUMADIN) 5 MG tablet Take 2.5-5 mg by mouth daily. Takes 5 mg on Sunday, Tuesday and Thursday and 2.5 mg all other days        Assessment: [age] YOF with Afib to start Lovenox. She was on coumadin prior to admission but her INR was only 1.2 yesterday. CHA2DS2VASc = 6. Her CBC wnl. CrCl ~ 72 mL/min. Per cardiology, pt is awaiting cardiac cath.   Goal of Therapy:  Anti-Xa level 0.6-1 units/ml Monitor platelets by anticoagulation protocol: Yes   Plan:  1) Lovenox 85 mg SQ Q 12 hours  2) Monitor CBC Q 72 hours, renal fx, and s/s of bleeding 3) F/u long term anti-coagulation plans   Vinnie LevelBenjamin Faline Langer, PharmD.  Clinical Pharmacist Pager [phone]

## 2013-11-22 NOTE — Progress Notes (Signed)
Subjective:  Some dizziness but no arrythmia during Myoview  Objective:  Vital Signs in the last 24 hours: Temp:  [97.6 F (36.4 C)-98.1 F (36.7 C)] 98.1 F (36.7 C) (06/02 1427) Pulse Rate:  [76-96] 96 (06/02 1427) Resp:  [15-25] 15 (06/02 1427) BP: (136-218)/(72-107) 141/83 mmHg (06/02 1427) SpO2:  [90 %-97 %] 97 % (06/02 1427) Weight:  [187 lb 13.3 oz (85.2 kg)] 187 lb 13.3 oz (85.2 kg) (06/02 0400)  Intake/Output from previous day:  Intake/Output Summary (Last 24 hours) at 11/22/13 1554 Last data filed at 11/22/13 1225  Gross per 24 hour  Intake    486 ml  Output      0 ml  Net    486 ml    Physical Exam: General appearance: alert, cooperative and no distress Lungs: clear to auscultation bilaterally Heart: irregularly irregular rhythm ; no M/R/G Abd: soft: NT/ND/NABS NEeuro: Cn 2-12 grossly intact.; does seem a bit confused - but per family notably better than yesterdya & this AM   Rate: 80  Rhythm: atrial fibrillation  Lab Results:  Recent Labs  11/21/13 0420  WBC 7.5  HGB 14.3  PLT 283    Recent Labs  11/21/13 0420 11/22/13 0430  NA 144 141  K 3.3* 3.2*  CL 102 102  CO2 27 24  GLUCOSE 97 111*  BUN 14 17  CREATININE 0.74 0.74    Recent Labs  11/21/13 0420 11/21/13 1053  TROPONINI <0.30 <0.30    Recent Labs  11/21/13 0420  INR 1.21    Imaging: Ct Head Wo Contrast  11/21/2013   CLINICAL DATA:  Syncope  EXAM: CT HEAD WITHOUT CONTRAST  TECHNIQUE: Contiguous axial images were obtained from the base of the skull through the vertex without intravenous contrast.  COMPARISON:  Prior CT from 02/01/2007  FINDINGS: Encephalomalacia within the right frontal lobe is again seen, compatible with remote infarct. Additional remote infarct within the right PCA territory seen as well, also stable.  No acute intracranial hemorrhage or large vessel territory infarct identified. Scattered and confluent hypodensity within the periventricular and deep white  matter of both cerebral hemispheres is most consistent with chronic small vessel ischemic changes. No mass lesion, midline shift, or hydrocephalus. No extra-axial fluid collection.  Calvarium is intact. Scalp soft tissues normal. No acute abnormality seen about the orbits.  Paranasal sinuses and mastoid air cells are clear.  IMPRESSION: 1. No acute intracranial abnormality. 2. Remote right frontal and occipital lobe infarcts.   Electronically Signed   By: Rise MuBenjamin  McClintock M.D.   On: 11/21/2013 04:23   Nm Myocar Multi W/spect W/wall Motion / Ef  11/22/2013   CLINICAL DATA:  Chest pain  EXAM: Lexiscan Myovue  TECHNIQUE: The patient received IV Lexiscan .4mg  over 15 seconds. 33.0 mCi of Technetium 6146m Sestamibi injected at 30 seconds. Quantitative SPECT images were obtained in the vertical, horizontal and short axis planes after a 45 minute delay. Rest images were obtained with similar planes and delay using 10.2 mCi of Technetium 846m Sestamibi.  FINDINGS: ECG: Afib poor R wave progression possible old IMI with lexiscan no changes  Symptoms: Dizziness  RAW Data:  Motion Artifact  QPS: 4  Quantitative Gated SPECT EF:  55% no RWMA appreciated  Perfusion Images: Inferolateral wall infarct mid and basal level. Distal anterior wall / apical infarct with peri infarct ischemia  IMPRESSION: Abnormal lexiscan myovue suggesting multivessel disease. Inferolateral wall infarct mid and basal level Distal anterior wall apical infarct with mild  peri infarct ischemia EF estimated at 55%  Charlton Haws   Electronically Signed   By: Charlton Haws M.D.   On: 11/22/2013 14:09    Cardiac Studies:  Assessment/Plan:   Principal Problem:   Syncope and collapse Active Problems:   Symptomatic bradycardia   Chronic atrial fibrillation   Dyslipidemia   Warfarin anticoagulation   Hypertension   PLAN: MD to review Myoview results.  Corine Shelter PA-C Beeper [phone] 11/22/2013, 3:54 PM   I saw & examined the patient  following the ST this PM     Results reviewed, suggest MV CAD  -- will need LHC in AM to assess - EF of Echo was reduced, but Myoview was ~55%.  I have discussed the R/B/A/I of LHC-Angio +/- PCI with the pt & family; she agrees to proceed  Is on statin / fibrate & restarting BB along with ARB.  CT Head without acute findings - ? If confusion was related to delerium (ICU psychosis) superimposed on demientia  BP still high - increased ARB today; will add back BB given elevated HR now on tele.  Using BB instead of CCB due to ST findings.  Lovenox AC while awaiting cath - then will need to restart full [facility] - will ask pharmacist to meet with pt.  CHA2DS2-Vasc Score = 6.    Nausea- prn zofran. HypoK - repleted   I spent well over 25 min (>50% of interview) answering ?s & counseling Pt & family on plan of care & discussing delerium.  Marykay Lex, MD

## 2013-11-23 ENCOUNTER — Encounter (HOSPITAL_COMMUNITY)
Admission: AD | Disposition: A | Discharge status: Home / Self Care | Payer: Self-pay | Source: Other Acute Inpatient Hospital | Attending: Interventional Cardiology

## 2013-11-23 DIAGNOSIS — E785 Hyperlipidemia, unspecified: Secondary | ICD-10-CM | POA: Diagnosis not present

## 2013-11-23 DIAGNOSIS — F05 Delirium due to known physiological condition: Secondary | ICD-10-CM

## 2013-11-23 DIAGNOSIS — I4891 Unspecified atrial fibrillation: Secondary | ICD-10-CM | POA: Diagnosis not present

## 2013-11-23 DIAGNOSIS — I6350 Cerebral infarction due to unspecified occlusion or stenosis of unspecified cerebral artery: Secondary | ICD-10-CM | POA: Diagnosis not present

## 2013-11-23 DIAGNOSIS — R9430 Abnormal result of cardiovascular function study, unspecified: Secondary | ICD-10-CM

## 2013-11-23 HISTORY — PX: LEFT HEART CATHETERIZATION WITH CORONARY ANGIOGRAM: SHX5451

## 2013-11-23 LAB — CBC
HCT: 43.9 % (ref 36.0–46.0)
HEMOGLOBIN: 15.1 g/dL — AB (ref 12.0–15.0)
MCH: 31.9 pg (ref 26.0–34.0)
MCHC: 34.4 g/dL (ref 30.0–36.0)
MCV: 92.8 fL (ref 78.0–100.0)
PLATELETS: 337 10*3/uL (ref 150–400)
RBC: 4.73 MIL/uL (ref 3.87–5.11)
RDW: 13.7 % (ref 11.5–15.5)
WBC: 8.5 10*3/uL (ref 4.0–10.5)

## 2013-11-23 SURGERY — LEFT HEART CATHETERIZATION WITH CORONARY ANGIOGRAM
Anesthesia: LOCAL

## 2013-11-23 MED ORDER — VERAPAMIL HCL 2.5 MG/ML IV SOLN
INTRAVENOUS | Status: AC
  Filled 2013-11-23: qty 2

## 2013-11-23 MED ORDER — LIDOCAINE HCL (PF) 1 % IJ SOLN
INTRAMUSCULAR | Status: AC
  Filled 2013-11-23: qty 30

## 2013-11-23 MED ORDER — HEPARIN (PORCINE) IN NACL 2-0.9 UNIT/ML-% IJ SOLN
INTRAMUSCULAR | Status: AC
Start: 2013-11-23 — End: 2013-11-23
  Filled 2013-11-23: qty 1500

## 2013-11-23 MED ORDER — NITROGLYCERIN 0.2 MG/ML ON CALL CATH LAB
INTRAVENOUS | Status: AC
  Filled 2013-11-23: qty 1

## 2013-11-23 MED ORDER — FENTANYL CITRATE 0.05 MG/ML IJ SOLN
INTRAMUSCULAR | Status: AC
  Filled 2013-11-23: qty 2

## 2013-11-23 MED ORDER — SODIUM CHLORIDE 0.9 % IV SOLN: INTRAVENOUS | Status: AC

## 2013-11-23 MED ORDER — MIDAZOLAM HCL 2 MG/2ML IJ SOLN
INTRAMUSCULAR | Status: AC
  Filled 2013-11-23: qty 2

## 2013-11-23 MED ORDER — HEPARIN SODIUM (PORCINE) 1000 UNIT/ML IJ SOLN
INTRAMUSCULAR | Status: AC
  Filled 2013-11-23: qty 1

## 2013-11-23 NOTE — Progress Notes (Addendum)
Subjective:  "I feel OK".  Patient had increased confusion during evening and early morning consistent with sun downing.  Patient appears more calm now.  Sitting with son at bedside.   Objective:  Vital Signs in the last 24 hours: Temp:  [97.3 F (36.3 C)-98.1 F (36.7 C)] 98.1 F (36.7 C) (06/03 0741) Pulse Rate:  [75-102] 85 (06/03 0741) Resp:  [15-26] 25 (06/03 0741) BP: (111-191)/(50-95) 146/85 mmHg (06/03 0741) SpO2:  [95 %-98 %] 95 % (06/03 0741) Weight:  [191 lb 9.3 oz (86.9 kg)] 191 lb 9.3 oz (86.9 kg) (06/03 0402)  Intake/Output from previous day:  Intake/Output Summary (Last 24 hours) at 11/23/13 0856 Last data filed at 11/23/13 0600  Gross per 24 hour  Intake 424.76 ml  Output      0 ml  Net 424.76 ml    Physical Exam: General appearance: alert, cooperative and no distress Lungs: clear to auscultation bilaterally Heart: irregularly irregular rhythm ; no M/R/G Abd: soft: NT/ND/NABS NEeuro: Cn 2-12 grossly intact.; does seem a bit confused - but per family notably better than yesterdya & this AM.      Rate: 80  Rhythm: atrial fibrillation - rate controlled  Lab Results:  Recent Labs  11/22/13 2024 11/23/13 0217  WBC 7.3 8.5  HGB 16.1* 15.1*  PLT 331 337    Recent Labs  11/22/13 0430 11/22/13 2024  NA 141 140  K 3.2* 3.6*  CL 102 98  CO2 24 28  GLUCOSE 111* 141*  BUN 17 22  CREATININE 0.74 1.34*    Recent Labs  11/21/13 0420 11/21/13 1053  TROPONINI <0.30 <0.30    Recent Labs  11/22/13 2024  INR 1.19    Imaging: Nm Myocar Multi W/spect W/wall Motion / Ef  11/22/2013   CLINICAL DATA:  Chest pain  EXAM: Lexiscan Myovue  TECHNIQUE: The patient received IV Lexiscan .4mg  over 15 seconds. 33.0 mCi of Technetium 2578m Sestamibi injected at 30 seconds. Quantitative SPECT images were obtained in the vertical, horizontal and short axis planes after a 45 minute delay. Rest images were obtained with similar planes and delay using 10.2 mCi of  Technetium 4778m Sestamibi.  FINDINGS: ECG: Afib poor R wave progression possible old IMI with lexiscan no changes  Symptoms: Dizziness  RAW Data:  Motion Artifact  QPS: 4  Quantitative Gated SPECT EF:  55% no RWMA appreciated  Perfusion Images: Inferolateral wall infarct mid and basal level. Distal anterior wall / apical infarct with peri infarct ischemia  IMPRESSION: Abnormal lexiscan myovue suggesting multivessel disease. Inferolateral wall infarct mid and basal level Distal anterior wall apical infarct with mild peri infarct ischemia EF estimated at 55%  Charlton HawsPeter Nishan   Electronically Signed   By: Charlton HawsPeter  Nishan M.D.   On: 11/22/2013 14:09    Cardiac Studies:  Assessment/Plan:   Principal Problem:   Syncope and collapse Active Problems:   Delirium not superimposed on dementia   Dyslipidemia   Chronic atrial fibrillation   Warfarin anticoagulation   Hypertension   Symptomatic bradycardia   Results reviewed, suggest MV CAD  -- will need LHC in AM to assess - EF of Echo was reduced, but Myoview was ~55%.  [facility]  Dr. Odessa FlemingS. Klein [facility] see [facility].   Is on statin / fibrate & restarted BB along with ARB.  CT Head without acute findings - ? If confusion was related to delerium (ICU psychosis) superimposed on demientia  SBP 131-146.  Better control after adding beta blocker/increasing ARB.  Lovenox AC while awaiting cath - then will need to restart full [facility] - will ask pharmacist to meet with pt.  CHA2DS2-Vasc Score = 6.    Nausea- prn zofran. HypoK - repleted   discussing delerium. Spoke with son regarding risk of elderly people becoming confused in hospital.  Safety issues discussed. Creatinine increased to 1.34.  Will monitor following LHC.   Written as scribe for Dr. Milus Mallick, NP Student   I saw & examined the patient along with Ms. Luther Redo NP -Student  We discussed the findings, exam & plan as noted.  I have edited the note as signed.   Marykay Lex, M.D., M.S. Interventional Cardiologist   Pager # [phone] 11/23/2013

## 2013-11-23 NOTE — Interval H&P Note (Signed)
History and Physical Interval Note:  11/23/2013 12:09 PM  Wanda Mckenzie  has presented today for cardiac cath with the diagnosis of cp  The various methods of treatment have been discussed with the patient and family. After consideration of risks, benefits and other options for treatment, the patient has consented to  Procedure(s): LEFT HEART CATHETERIZATION WITH CORONARY ANGIOGRAM (N/A) as a surgical intervention .  [name] history has been reviewed, patient examined, no change in status, stable for surgery.  I have reviewed the patient's chart and labs.  Questions were answered to the patient's satisfaction.    Cath Lab Visit (complete for each Cath Lab visit)  Clinical Evaluation Leading to the Procedure:   ACS: no  Non-ACS:    Anginal Classification: CCS II  Anti-ischemic medical therapy: Maximal Therapy (2 or more classes of medications)  Non-Invasive Test Results: High-risk stress test findings: cardiac mortality >3%/year  Prior CABG: No previous CABG        Kathleene Hazel

## 2013-11-23 NOTE — CV Procedure (Signed)
      Cardiac Catheterization Operative Report  Wanda Mckenzie [id number] 6/3/201512:35 PM Josue Hector, MD  Procedure Performed:  1. Left Heart Catheterization 2. Selective Coronary Angiography  Operator: Verne Carrow, MD  Arterial access site:  Right radial artery.   Indication: 78 yo female with LV systolic dysfunction, syncope and abnormal stress myoview suggestive of ischemia.                                       Procedure Details: The risks, benefits, complications, treatment options, and expected outcomes were discussed with the patient. The patient and/or family concurred with the proposed plan, giving informed consent. The patient was brought to the cath lab after IV hydration was begun and oral premedication was given. The patient was further sedated with Versed and Fentanyl. The right wrist was assessed with an Allens test which was positive. The right wrist was prepped and draped in a sterile fashion. 1% lidocaine was used for local anesthesia. Using the modified Seldinger access technique, a 5 French sheath was placed in the right radial artery. 3 mg Verapamil was given through the sheath. 4000 units IV heparin was given. Standard diagnostic catheters were used to perform selective coronary angiography. LV pressures measured with JR4 catheter. The sheath was removed from the right radial artery and a Terumo hemostasis band was applied at the arteriotomy site on the right wrist.   There were no immediate complications. The patient was taken to the recovery area in stable condition.   Hemodynamic Findings: Central aortic pressure: 150/78 Left ventricular pressure: 144/6/9  Angiographic Findings:  Left main: No obstructive disease.   Left Anterior Descending Artery: Large caliber vessel that courses to the apex. There is a small to moderate caliber diagonal branch. No obstructive disease.   Circumflex Artery: Moderate caliber vessel that terminates into  a small to moderate caliber obtuse marginal branch.   Right Coronary Artery: Large dominant vessel with no obstructive disease.   Left Ventricular Angiogram: Deferred due to renal insufficiency  Impression: 1. No angiographic evidence of CAD   Recommendations: Continue workup for syncope. No further ischemic evaluation.        Complications:  None. The patient tolerated the procedure well.

## 2013-11-23 NOTE — H&P (View-Only) (Signed)
  Subjective:  "I feel OK".  Patient had increased confusion during evening and early morning consistent with sun downing.  Patient appears more calm now.  Sitting with son at bedside.   Objective:  Vital Signs in the last 24 hours: Temp:  [97.3 F (36.3 C)-98.1 F (36.7 C)] 98.1 F (36.7 C) (06/03 0741) Pulse Rate:  [75-102] 85 (06/03 0741) Resp:  [15-26] 25 (06/03 0741) BP: (111-191)/(50-95) 146/85 mmHg (06/03 0741) SpO2:  [95 %-98 %] 95 % (06/03 0741) Weight:  [191 lb 9.3 oz (86.9 kg)] 191 lb 9.3 oz (86.9 kg) (06/03 0402)  Intake/Output from previous day:  Intake/Output Summary (Last 24 hours) at 11/23/13 0856 Last data filed at 11/23/13 0600  Gross per 24 hour  Intake 424.76 ml  Output      0 ml  Net 424.76 ml    Physical Exam: General appearance: alert, cooperative and no distress Lungs: clear to auscultation bilaterally Heart: irregularly irregular rhythm ; no M/R/G Abd: soft: NT/ND/NABS NEeuro: Cn 2-12 grossly intact.; does seem a bit confused - but per family notably better than yesterdya & this AM.      Rate: 80  Rhythm: atrial fibrillation - rate controlled  Lab Results:  Recent Labs  11/22/13 2024 11/23/13 0217  WBC 7.3 8.5  HGB 16.1* 15.1*  PLT 331 337    Recent Labs  11/22/13 0430 11/22/13 2024  NA 141 140  K 3.2* 3.6*  CL 102 98  CO2 24 28  GLUCOSE 111* 141*  BUN 17 22  CREATININE 0.74 1.34*    Recent Labs  11/21/13 0420 11/21/13 1053  TROPONINI <0.30 <0.30    Recent Labs  11/22/13 2024  INR 1.19    Imaging: Nm Myocar Multi W/spect W/wall Motion / Ef  11/22/2013   CLINICAL DATA:  Chest pain  EXAM: Lexiscan Myovue  TECHNIQUE: The patient received IV Lexiscan .4mg over 15 seconds. 33.0 mCi of Technetium 99m Sestamibi injected at 30 seconds. Quantitative SPECT images were obtained in the vertical, horizontal and short axis planes after a 45 minute delay. Rest images were obtained with similar planes and delay using 10.2 mCi of  Technetium 99m Sestamibi.  FINDINGS: ECG: Afib poor R wave progression possible old IMI with lexiscan no changes  Symptoms: Dizziness  RAW Data:  Motion Artifact  QPS: 4  Quantitative Gated SPECT EF:  55% no RWMA appreciated  Perfusion Images: Inferolateral wall infarct mid and basal level. Distal anterior wall / apical infarct with peri infarct ischemia  IMPRESSION: Abnormal lexiscan myovue suggesting multivessel disease. Inferolateral wall infarct mid and basal level Distal anterior wall apical infarct with mild peri infarct ischemia EF estimated at 55%  Peter Nishan   Electronically Signed   By: Peter  Nishan M.D.   On: 11/22/2013 14:09    Cardiac Studies:  Assessment/Plan:   Principal Problem:   Syncope and collapse Active Problems:   Delirium not superimposed on dementia   Dyslipidemia   Chronic atrial fibrillation   Warfarin anticoagulation   Hypertension   Symptomatic bradycardia   Results reviewed, suggest MV CAD  -- will need LHC in AM to assess - EF of Echo was reduced, but Myoview was ~55%.  [facility] today.  Dr. S. Klein to see patient after results of [facility] obtained.   Is on statin / fibrate & restarted BB along with ARB.  CT Head without acute findings - ? If confusion was related to delerium (ICU psychosis) superimposed on demientia  SBP 131-146.    Better control after adding beta blocker/increasing ARB.  Lovenox AC while awaiting cath - then will need to restart full [facility] - will ask pharmacist to meet with pt.  CHA2DS2-Vasc Score = 6.    Nausea- prn zofran. HypoK - repleted   discussing delerium. Spoke with son regarding risk of elderly people becoming confused in hospital.  Safety issues discussed. Creatinine increased to 1.34.  Will monitor following LHC.   Written as scribe for Dr. Milus Mallick, NP Student   I saw & examined the patient along with Ms. Luther Redo NP -Student  We discussed the findings, exam & plan as noted.  I have edited the note as signed.   Marykay Lex, M.D., M.S. Interventional Cardiologist   Pager # [phone] 11/23/2013

## 2013-11-23 NOTE — Care Management Note (Addendum)
  Page 1 of 1   11/24/2013     2:38:55 PM CARE MANAGEMENT NOTE 11/24/2013  Patient:  Wanda Mckenzie, Wanda Mckenzie   Account Number:  [id number]  Date Initiated:  11/23/2013  Documentation initiated by:  Donato Schultz  Subjective/Objective Assessment:   Admitted with syncopye     Action/Plan:   CM to follow for dispostion needs   Anticipated DC Date:  11/24/2013   Anticipated DC Plan:  HOME/SELF CARE         Choice offered to / List presented to:             Status of service:  Completed, signed off Medicare Important Message given?  YES (If response is "NO", the following Medicare IM given date fields will be blank) Date Medicare IM given:  11/23/2013 Date Additional Medicare IM given:    Discharge Disposition:  HOME/SELF CARE  Per UR Regulation:  Reviewed for med. necessity/level of care/duration of stay  If discussed at Long Length of Stay Meetings, dates discussed:    Comments:  Champagne Paletta RN, BSN, MSHL, CCM  Nurse - Case Manager, (Unit [phone]  11/23/2013 Dispositon Plan:  Home / self care.

## 2013-11-23 NOTE — Progress Notes (Signed)
Pt taken to cardiac cath.

## 2013-11-23 NOTE — Progress Notes (Signed)
Pt taken to cath lab

## 2013-11-24 ENCOUNTER — Encounter (HOSPITAL_COMMUNITY): Payer: Self-pay | Admitting: Cardiology

## 2013-11-24 DIAGNOSIS — R55 Syncope and collapse: Secondary | ICD-10-CM | POA: Diagnosis not present

## 2013-11-24 LAB — BASIC METABOLIC PANEL
BUN: 19 mg/dL (ref 6–23)
CALCIUM: 9.3 mg/dL (ref 8.4–10.5)
CO2: 27 mEq/L (ref 19–32)
Chloride: 105 mEq/L (ref 96–112)
Creatinine, Ser: 0.79 mg/dL (ref 0.50–1.10)
GFR calc non Af Amer: 79 mL/min — ABNORMAL LOW (ref >90)
Glucose, Bld: 107 mg/dL — ABNORMAL HIGH (ref 70–99)
Potassium: 3.8 mEq/L (ref 3.7–5.3)
SODIUM: 144 meq/L (ref 137–147)

## 2013-11-24 MED ORDER — OMEGA-3-ACID ETHYL ESTERS 1 G PO CAPS: 2.0000 g | ORAL_CAPSULE | Freq: Two times a day (BID) | ORAL | Status: DC

## 2013-11-24 MED ORDER — WARFARIN SODIUM 7.5 MG PO TABS
7.5000 mg | ORAL_TABLET | Freq: Once | ORAL | Status: DC
  Filled 2013-11-24: qty 1

## 2013-11-24 MED ORDER — HYDROCHLOROTHIAZIDE 25 MG PO TABS
25.0000 mg | ORAL_TABLET | Freq: Every day | ORAL | Status: DC
  Administered 2013-11-24: 11:00:00 25 mg via ORAL
  Filled 2013-11-24: qty 1

## 2013-11-24 MED ORDER — WARFARIN - PHARMACIST DOSING INPATIENT: Freq: Every day | Status: DC

## 2013-11-24 MED ORDER — LOSARTAN POTASSIUM 50 MG PO TABS: 100.0000 mg | ORAL_TABLET | Freq: Every day | ORAL | Status: DC

## 2013-11-24 MED ORDER — LOSARTAN POTASSIUM 100 MG PO TABS: 100.0000 mg | ORAL_TABLET | Freq: Every day | ORAL | Status: DC

## 2013-11-24 MED ORDER — METOPROLOL TARTRATE 25 MG PO TABS: 12.5000 mg | ORAL_TABLET | Freq: Two times a day (BID) | ORAL | Status: DC

## 2013-11-24 MED ORDER — ADULT MULTIVITAMIN W/MINERALS CH: 1.0000 | ORAL_TABLET | Freq: Every day | ORAL | Status: DC

## 2013-11-24 NOTE — Progress Notes (Signed)
ANTICOAGULATION CONSULT NOTE  Pharmacy Consult for Coumadin Indication: atrial fibrillation  Allergies  Allergen Reactions  . Codeine     REACTION: vomiting    Patient Measurements: Height: 5\' 10"  (177.8 cm) Weight: 191 lb 2.2 oz (86.7 kg) IBW/kg (Calculated) : 68.5  Vital Signs: Temp: 97.4 F (36.3 C) (06/04 0524) Temp src: Oral (06/04 0524) BP: 182/89 mmHg (06/04 0524) Pulse Rate: 68 (06/04 0524)  Labs:  Recent Labs  11/21/13 1053 11/22/13 0430 11/22/13 2024 11/23/13 0217  HGB  --   --  16.1* 15.1*  HCT  --   --  45.8 43.9  PLT  --   --  331 337  LABPROT  --   --  14.8  --   INR  --   --  1.19  --   CREATININE  --  0.74 1.34*  --   TROPONINI <0.30  --   --   --     Estimated Creatinine Clearance: 43.4 ml/min (by C-G formula based on Cr of 1.34).   Medications:  Prescriptions prior to admission  Medication Sig Dispense Refill  . alendronate (FOSAMAX) 70 MG tablet Take 70 mg by mouth every 7 (seven) days. Take with a full glass of water on an empty stomach. On sundays      . atenolol (TENORMIN) 25 MG tablet Take 25 mg by mouth daily.       Marland Kitchen atorvastatin (LIPITOR) 80 MG tablet Take 80 mg by mouth daily.        . calcium carbonate (OS-CAL) 600 MG TABS Take 600 mg by mouth 2 (two) times daily with a meal.       . diltiazem (CARDIZEM) 120 MG tablet Take 120 mg by mouth 2 (two) times daily.       . fenofibrate 160 MG tablet Take 160 mg by mouth daily.        . Folic Acid-Vit B6-Vit B12 (FOLBEE) 2.5-25-1 MG TABS Take 1 tablet by mouth daily.        . furosemide (LASIX) 20 MG tablet Take 20 mg by mouth daily.      . hydrochlorothiazide (HYDRODIURIL) 25 MG tablet Take 25 mg by mouth daily.      . isosorbide mononitrate (IMDUR) 30 MG 24 hr tablet Take 30 mg by mouth daily.       . meclizine (ANTIVERT) 25 MG tablet Take 25 mg by mouth every 8 (eight) hours as needed for dizziness.      . potassium chloride SA (K-DUR,KLOR-CON) 20 MEQ tablet Take 20 mEq by mouth 2 (two)  times daily.      Marland Kitchen warfarin (COUMADIN) 5 MG tablet Take 2.5-5 mg by mouth daily. Takes 5 mg on Sunday, Tuesday and Thursday and 2.5 mg all other days        Assessment: 78 yo female with h/o Afib, s/p cath, to resume Coumadin  Goal of Therapy:  INR 2-3 Monitor platelets by anticoagulation protocol: Yes   Plan:  Coumadin 7.5 mg today Daily INR  Gary Fleet Arby Dahir 11/24/2013,6:32 AM

## 2013-11-24 NOTE — Progress Notes (Signed)
78 yo female with LV systolic dysfunction, syncope and abnormal stress myoview suggestive of ischemia. Cardiac cath 11/23/13, no CAD; EF 40-45% by echo.   Subjective: No complaints  Objective: Vital signs in last 24 hours: Temp:  [97.4 F (36.3 C)-97.8 F (36.6 C)] 97.7 F (36.5 C) (06/04 0738) Pulse Rate:  [68-95] 86 (06/04 0738) Resp:  [16-18] 18 (06/04 0738) BP: (136-182)/(64-100) 180/82 mmHg (06/04 0738) SpO2:  [91 %-97 %] 97 % (06/04 0738) Weight:  [191 lb 2.2 oz (86.7 kg)] 191 lb 2.2 oz (86.7 kg) (06/04 0500) Weight change: -7.1 oz (-0.2 kg) Last BM Date: 11/22/13 Intake/Output from previous day: +317 06/03 0701 - 06/04 0700 In: 432 [I.V.:432] Out: 200 [Urine:200] Intake/Output this shift: Total I/O In: -  Out: 300 [Urine:300]  PE: General:Pleasant affect, NAD Skin:Warm and dry, brisk capillary refill HEENT:normocephalic, sclera clear, mucus membranes moist Heart:irreg irreg without murmur, gallup, rub or click Lungs:clear without rales, rhonchi, or wheezes PHX:TAVW, non tender, + BS, do not palpate liver spleen or masses Ext:no lower ext edema, 2+ pedal pulses, 2+ radial pulses, rt cath site without hematoma Neuro:alert and oriented, MAE, follows commands, + facial symmetry  tele:  A fib, chronic with HR to 40s more at night otherwise in the 70s Lab Results:  Recent Labs  11/22/13 2024 11/23/13 0217  WBC 7.3 8.5  HGB 16.1* 15.1*  HCT 45.8 43.9  PLT 331 337   BMET  Recent Labs  11/22/13 2024 11/24/13 0645  NA 140 144  K 3.6* 3.8  CL 98 105  CO2 28 27  GLUCOSE 141* 107*  BUN 22 19  CREATININE 1.34* 0.79  CALCIUM 10.0 9.3    Recent Labs  11/21/13 1053  TROPONINI <0.30    Lab Results  Component Value Date   CHOL 118 11/21/2013   HDL 44 11/21/2013   LDLCALC 54 11/21/2013   TRIG 100 11/21/2013   CHOLHDL 2.7 11/21/2013   Lab Results  Component Value Date   HGBA1C 5.9* 11/20/2013     Lab Results  Component Value Date   TSH 3.130  11/20/2013       Studies/Results: Nm Myocar Multi W/spect W/wall Motion / Ef  11/22/2013   CLINICAL DATA:  Chest pain  EXAM: Lexiscan Myovue  TECHNIQUE: The patient received IV Lexiscan .4mg  over 15 seconds. 33.0 mCi of Technetium 27m Sestamibi injected at 30 seconds. Quantitative SPECT images were obtained in the vertical, horizontal and short axis planes after a 45 minute delay. Rest images were obtained with similar planes and delay using 10.2 mCi of Technetium 1m Sestamibi.  FINDINGS: ECG: Afib poor R wave progression possible old IMI with lexiscan no changes  Symptoms: Dizziness  RAW Data:  Motion Artifact  QPS: 4  Quantitative Gated SPECT EF:  55% no RWMA appreciated  Perfusion Images: Inferolateral wall infarct mid and basal level. Distal anterior wall / apical infarct with peri infarct ischemia  IMPRESSION: Abnormal lexiscan myovue suggesting multivessel disease. Inferolateral wall infarct mid and basal level Distal anterior wall apical infarct with mild peri infarct ischemia EF estimated at 55%  Charlton Haws   Electronically Signed   By: Charlton Haws M.D.   On: 11/22/2013 14:09   2  D Echo: - Left ventricle: The cavity size was at the upper limits of normal. Wall thickness was normal. Systolic function was mildly to moderately reduced. The estimated ejection fraction was in the range of 40% to 45%. Diffuse hypokinesis. Doppler parameters are  consistent with high ventricular filling pressure. - Aortic valve: There was mild to moderate regurgitation. - Mitral valve: There was mild regurgitation. - Left atrium: The atrium was mildly dilated. - Right atrium: The atrium was mildly to moderately dilated    Cardiac cath: Angiographic Findings:  Left main: No obstructive disease.  Left Anterior Descending Artery: Large caliber vessel that courses to the apex. There is a small to moderate caliber diagonal branch. No obstructive disease.  Circumflex Artery: Moderate caliber vessel that  terminates into a small to moderate caliber obtuse marginal branch.  Right Coronary Artery: Large dominant vessel with no obstructive disease.  Left Ventricular Angiogram: Deferred due to renal insufficiency  Impression:  1. No angiographic evidence of CAD   Medications: I have reviewed the patient's current medications. Scheduled Meds: . atorvastatin  80 mg Oral q1800  . fenofibrate  160 mg Oral Daily  . folic acid-pyridoxine-cyancobalamin  1 tablet Oral Daily  . furosemide  20 mg Oral Daily  . losartan  50 mg Oral Daily  . metoprolol tartrate  12.5 mg Oral BID  . multivitamin with minerals  1 tablet Oral Daily  . omega-3 acid ethyl esters  2 g Oral BID  . sodium chloride  3 mL Intravenous Q12H  . warfarin  7.5 mg Oral ONCE-1800  . Warfarin - Pharmacist Dosing Inpatient   Does not apply q1800   Continuous Infusions:  PRN Meds:.sodium chloride, acetaminophen, hydrALAZINE, sodium chloride  Assessment/Plan: Principal Problem:  Syncope and collapse  Active Problems:  Delirium not superimposed on dementia  Dyslipidemia  Chronic atrial fibrillation  Warfarin anticoagulation  Hypertension  Needs autgmented Tx Symptomatic bradycardia   Results reviewed, suggest MV CAD -- no CAD on cath, - EF of Echo was reduced, but Myoview was ~55%.  Dr. Odessa FlemingS. Klein to see patient [facility]. Was on atenolol 25 daily now on lopressor 12.5 BID-? Loop recorder for eval with syncope Is on statin / fibrate & restarted BB along with ARB. CT Head without acute findings - ? If confusion was related to delerium (ICU psychosis) superimposed on demientia- oriented today SBP 131-146. Better control after adding beta blocker/increasing ARB.  Lovenox AC while awaiting cath - then [facility] will ask [facility]ist to meet with pt. CHA2DS2-Vasc Score = 6. Resume coumadin unless Dr. Graciela HusbandsKlein prefers to hold. Nausea- prn zofran.  HypoK - repleted  discussing delerium. Spoke with son  regarding risk of elderly people becoming confused in hospital. Safety issues discussed.  Creatinine increased to 1.34. Will monitor following LHC  Prior to cath bump to 1.34 from 0.74 will recheck today.>> much improved   Hope to discharge today.   LOS: 4 days   Time spent with pt. :15 minutes. Duke SalviaSteven C Klein  Nurse Practitioner Certified Pager [phone] or after 5pm and on weekends call [phone] 11/24/2013, 8:48 AM  Increase losartan --50>>100  Add HCTZ 25 Needs bmet in 2 weeks F/u PCP 2 weeks  /u JK 2 months

## 2013-11-24 NOTE — Discharge Summary (Signed)
Physician Discharge Summary       Patient ID: Wanda Mckenzie MRN: [id number] DOB/AGE: 1936/06/17 78 y.o.  Admit date: 11/20/2013 Discharge date: 11/24/2013  Discharge Diagnoses:  Principal Problem:   Syncope and collapse Active Problems:   Chest pain, normal coronary arteries   Dyslipidemia   Chronic atrial fibrillation   Warfarin anticoagulation   Hypertension   Symptomatic bradycardia   Delirium not superimposed on dementia   Discharged Condition: good Primary Cardiologist:  Dr. Myrtis SerKatz  Procedures: 11/23/13 cardiac cath by Dr. Geronimo BootMcAlhany   Hospital Course: 78 yo woman with PMH of prior CVA, mild AI, COPD, permanent atrial fibrillation on warfarin chronically, hypertension who presents to [facility] with syncope at church on 11/20/13. She related on admit that she's had some dizziness spells from time to time over the last several weeks to few months. Initially, she didn't remember she was at church the morning of admit, but when reminded her, that the day was Sunday (she seemed to think it was Monday and she knew she had been at another hospital) she said she had gotten light-headed at church and perhaps passed out. She otherwise does not detail her passing out story earlier only from the chart it was surmised that she was at Sunday School and had a witnessed LOC. No trauma noted on note or brief exam. She denied chest pain or other episodes of LOC/syncope. She remembers to take her medications at times.   Her HR was 52 on admit.  Her cardizem and her BB were held.  She was admitted and ruled out for MI.  She was seen in consult by Dr. Graciela HusbandsKlein for possible PPM.  There is no evidence of significant bradycardia, AV block or pause.  Due to some tachycardia BB added back at a lower dose.  Nuc study was + for ischemia.  She then underwent cardiac cath and no Coronary disease noted.  BY the next AM she was stable, she does have some confusion at times and this had been increased with  hospitalization.  Dr. Graciela HusbandsKlein saw her AM of discharge and found her to be stable for discharge.  Her BP was elevated and meds were adjusted.  She will follow up with PCP and Dr. Myrtis SerKatz.   Consults: EP with Dr. Graciela HusbandsKlein  Significant Diagnostic Studies:  BMET    Component Value Date/Time   NA 144 11/24/2013 0645   K 3.8 11/24/2013 0645   CL 105 11/24/2013 0645   CO2 27 11/24/2013 0645   GLUCOSE 107* 11/24/2013 0645   BUN 19 11/24/2013 0645   CREATININE 0.79 11/24/2013 0645   CALCIUM 9.3 11/24/2013 0645   GFRNONAA 79* 11/24/2013 0645   GFRAA >90 11/24/2013 0645    CBC    Component Value Date/Time   WBC 8.5 11/23/2013 0217   RBC 4.73 11/23/2013 0217   HGB 15.1* 11/23/2013 0217   HCT 43.9 11/23/2013 0217   PLT 337 11/23/2013 0217   MCV 92.8 11/23/2013 0217   MCH 31.9 11/23/2013 0217   MCHC 34.4 11/23/2013 0217   RDW 13.7 11/23/2013 0217   LYMPHSABS 2.0 01/31/2007 2320   MONOABS 0.5 01/31/2007 2320   EOSABS 0.1 01/31/2007 2320   BASOSABS 0.0 01/31/2007 2320     Troponin <0.30 X 3 BNP (last 3 results)  Recent Labs  11/20/13 2227  PROBNP 1146.0*   Lipid Panel     Component Value Date/Time   CHOL 118 11/21/2013 0420   TRIG 100 11/21/2013 0420   HDL 44  11/21/2013 0420   CHOLHDL 2.7 11/21/2013 0420   VLDL 20 11/21/2013 0420   LDLCALC 54 11/21/2013 0420    HgB A1C 5.9 TSH 3.13  2D ECHO: Left ventricle: The cavity size was at the upper limits of normal. Wall thickness was normal. Systolic function was mildly to moderately reduced. The estimated ejection fraction was in the range of 40% to 45%. Diffuse hypokinesis. Doppler parameters are consistent with high ventricular filling pressure. - Aortic valve: There was mild to moderate regurgitation. - Mitral valve: There was mild regurgitation. - Left atrium: The atrium was mildly dilated. - Right atrium: The atrium was mildly to moderately dilated.  NUC STUDY: IMPRESSION: Abnormal lexiscan myovue suggesting multivessel disease. Inferolateral wall infarct mid and basal  level Distal anterior wall apical infarct with mild peri infarct ischemia EF estimated at 55%   CT Head: IMPRESSION: 1. No acute intracranial abnormality. 2. Remote right frontal and occipital lobe infarcts  Cardiac Cath: Angiographic Findings:  Left main: No obstructive disease.  Left Anterior Descending Artery: Large caliber vessel that courses to the apex. There is a small to moderate caliber diagonal branch. No obstructive disease.  Circumflex Artery: Moderate caliber vessel that terminates into a small to moderate caliber obtuse marginal branch.  Right Coronary Artery: Large dominant vessel with no obstructive disease.  Left Ventricular Angiogram: Deferred due to renal insufficiency        Discharge Exam: Blood pressure 180/82, pulse 86, temperature 97.7 F (36.5 C), temperature source Oral, resp. rate 18, height 5\' 10"  (1.778 m), weight 191 lb 2.2 oz (86.7 kg), SpO2 97.00%.   Disposition: 01-Home or Self Care     Medication List    STOP taking these medications       atenolol 25 MG tablet  Commonly known as:  TENORMIN     diltiazem 120 MG tablet  Commonly known as:  CARDIZEM     fish oil-omega-3 fatty acids 1000 MG capsule  Replaced by:  omega-3 acid ethyl esters 1 G capsule     isosorbide mononitrate 30 MG 24 hr tablet  Commonly known as:  IMDUR      TAKE these medications       alendronate 70 MG tablet  Commonly known as:  FOSAMAX  - Take 70 mg by mouth every 7 (seven) days. Take with a full glass of water on an empty stomach.  - On sundays     atorvastatin 80 MG tablet  Commonly known as:  LIPITOR  Take 80 mg by mouth daily.     calcium carbonate 600 MG Tabs tablet  Commonly known as:  OS-CAL  Take 600 mg by mouth 2 (two) times daily with a meal.     fenofibrate 160 MG tablet  Take 160 mg by mouth daily.     Folic Acid-Vit B6-Vit B12 2.5-25-1 MG Tabs tablet  Commonly known as:  FOLBEE  Take 1 tablet by mouth daily.     furosemide 20 MG  tablet  Commonly known as:  LASIX  Take 20 mg by mouth daily.     hydrochlorothiazide 25 MG tablet  Commonly known as:  HYDRODIURIL  Take 25 mg by mouth daily.     losartan 100 MG tablet  Commonly known as:  COZAAR  Take 1 tablet (100 mg total) by mouth daily.  Start taking on:  11/25/2013     meclizine 25 MG tablet  Commonly known as:  ANTIVERT  Take 25 mg by mouth every 8 (eight) hours as  needed for dizziness.     metoprolol tartrate 25 MG tablet  Commonly known as:  LOPRESSOR  Take 0.5 tablets (12.5 mg total) by mouth 2 (two) times daily.     multivitamin with minerals Tabs tablet  Take 1 tablet by mouth daily.     omega-3 acid ethyl esters 1 G capsule  Commonly known as:  LOVAZA  Take 2 capsules (2 g total) by mouth 2 (two) times daily.     potassium chloride SA 20 MEQ tablet  Commonly known as:  K-DUR,KLOR-CON  Take 20 mEq by mouth 2 (two) times daily.     warfarin 5 MG tablet  Commonly known as:  COUMADIN  Take 2.5-5 mg by mouth daily. Takes 5 mg on Sunday, Tuesday and Thursday and 2.5 mg all other days           Follow-up Information   Follow up with Willa Rough, MD On 12/05/2013. (at 3:30 pm)    Specialty:  Cardiology   Contact information:   [address] [place] [address] [phone]       Follow up with [facility] On 11/29/2013. (at 11:20 AM)        Discharge Instructions:  For today only take 7.5 mg of coumadin, then tomorrow begin regular dosing.  Follow up with your primary MD in 2 weeks for BP check make appt today.  Heart Healthy diet.   Signed: Nada Boozer Nurse Practitioner-Certified [facility]: [facility] 11/24/2013, 9:42 AM  Time spent on discharge : >30 minutes.

## 2013-11-24 NOTE — Discharge Instructions (Signed)
For today only take 7.5 mg of coumadin, then tomorrow begin regular dosing.  Follow up with your primary MD in 2 weeks for BP check make appt today.  Heart Healthy diet.

## 2013-11-24 NOTE — Progress Notes (Signed)
TR BAND REMOVAL  LOCATION:    right radial  DEFLATED PER PROTOCOL:    yes  TIME BAND OFF / DRESSING APPLIED:    1530   SITE UPON ARRIVAL:    Level 0  SITE AFTER BAND REMOVAL:    Level 0  REVERSE ALLEN'S TEST:     positive  CIRCULATION SENSATION AND MOVEMENT:    Within Normal Limits   yes  COMMENTS:   Dressing rechecked at 1600 with no change in assessment, dressing remains dry and intact

## 2013-11-29 ENCOUNTER — Ambulatory Visit (INDEPENDENT_AMBULATORY_CARE_PROVIDER_SITE_OTHER): Payer: Medicare HMO | Admitting: *Deleted

## 2013-11-29 DIAGNOSIS — I6350 Cerebral infarction due to unspecified occlusion or stenosis of unspecified cerebral artery: Secondary | ICD-10-CM

## 2013-11-29 DIAGNOSIS — I4891 Unspecified atrial fibrillation: Secondary | ICD-10-CM

## 2013-11-29 DIAGNOSIS — I639 Cerebral infarction, unspecified: Secondary | ICD-10-CM

## 2013-11-29 DIAGNOSIS — Z7901 Long term (current) use of anticoagulants: Secondary | ICD-10-CM

## 2013-11-29 DIAGNOSIS — I482 Chronic atrial fibrillation, unspecified: Secondary | ICD-10-CM

## 2013-11-29 LAB — POCT INR: INR: 1.2

## 2013-12-01 ENCOUNTER — Encounter: Payer: Self-pay | Admitting: Cardiology

## 2013-12-01 DIAGNOSIS — F0390 Unspecified dementia without behavioral disturbance: Secondary | ICD-10-CM | POA: Insufficient documentation

## 2013-12-01 DIAGNOSIS — R001 Bradycardia, unspecified: Secondary | ICD-10-CM | POA: Insufficient documentation

## 2013-12-05 ENCOUNTER — Ambulatory Visit (INDEPENDENT_AMBULATORY_CARE_PROVIDER_SITE_OTHER): Payer: Commercial Managed Care - HMO | Admitting: *Deleted

## 2013-12-05 ENCOUNTER — Encounter: Payer: Self-pay | Admitting: Cardiology

## 2013-12-05 ENCOUNTER — Ambulatory Visit (INDEPENDENT_AMBULATORY_CARE_PROVIDER_SITE_OTHER): Payer: Commercial Managed Care - HMO | Admitting: Cardiology

## 2013-12-05 VITALS — BP 148/78 | HR 74 | Ht 70.0 in | Wt 198.8 lb

## 2013-12-05 DIAGNOSIS — I359 Nonrheumatic aortic valve disorder, unspecified: Secondary | ICD-10-CM

## 2013-12-05 DIAGNOSIS — Z7901 Long term (current) use of anticoagulants: Secondary | ICD-10-CM

## 2013-12-05 DIAGNOSIS — I6350 Cerebral infarction due to unspecified occlusion or stenosis of unspecified cerebral artery: Secondary | ICD-10-CM

## 2013-12-05 DIAGNOSIS — R9430 Abnormal result of cardiovascular function study, unspecified: Secondary | ICD-10-CM

## 2013-12-05 DIAGNOSIS — I4891 Unspecified atrial fibrillation: Secondary | ICD-10-CM

## 2013-12-05 DIAGNOSIS — I482 Chronic atrial fibrillation, unspecified: Secondary | ICD-10-CM

## 2013-12-05 DIAGNOSIS — I639 Cerebral infarction, unspecified: Secondary | ICD-10-CM

## 2013-12-05 DIAGNOSIS — R079 Chest pain, unspecified: Secondary | ICD-10-CM

## 2013-12-05 DIAGNOSIS — F0390 Unspecified dementia without behavioral disturbance: Secondary | ICD-10-CM

## 2013-12-05 DIAGNOSIS — I351 Nonrheumatic aortic (valve) insufficiency: Secondary | ICD-10-CM

## 2013-12-05 DIAGNOSIS — E785 Hyperlipidemia, unspecified: Secondary | ICD-10-CM

## 2013-12-05 DIAGNOSIS — R0989 Other specified symptoms and signs involving the circulatory and respiratory systems: Secondary | ICD-10-CM

## 2013-12-05 DIAGNOSIS — R55 Syncope and collapse: Secondary | ICD-10-CM

## 2013-12-05 DIAGNOSIS — I10 Essential (primary) hypertension: Secondary | ICD-10-CM

## 2013-12-05 LAB — POCT INR: INR: 2.6

## 2013-12-05 NOTE — Assessment & Plan Note (Signed)
Blood pressures controlled on current medications. No change in therapy.

## 2013-12-05 NOTE — Assessment & Plan Note (Signed)
She has chronic atrial fibrillation. Rate is controlled. She is anticoagulated. No change in therapy. 

## 2013-12-05 NOTE — Assessment & Plan Note (Signed)
There is mention of some of the records of some question of some dementia. This is not quantified anywhere. She seems totally appropriate at this time. No further workup.

## 2013-12-05 NOTE — Assessment & Plan Note (Signed)
Her ejection fraction was somewhat reduced at the time of this admission. Etiology is not clear. Over time will consider followup echo.

## 2013-12-05 NOTE — Assessment & Plan Note (Signed)
Her aortic insufficiency was reassessed during recent hospitalization. A I is mild to moderate. This will be followed over time.

## 2013-12-05 NOTE — Patient Instructions (Signed)

## 2013-12-05 NOTE — Assessment & Plan Note (Signed)
She is anticoagulated with warfarin. No change in therapy.

## 2013-12-05 NOTE — Assessment & Plan Note (Signed)
Her most recent episode of syncope appears to have been early mediated. No further workup at this time.

## 2013-12-05 NOTE — Assessment & Plan Note (Signed)
In the past she had chest pain. Her nuclear scan in the hospital recently was abnormal. However catheterization revealed no significant coronary disease. No further workup is needed. She's not having any recurrent chest pain.

## 2013-12-05 NOTE — Progress Notes (Signed)
Patient ID: Wanda Mckenzie, female   DOB: [birth year], 78 y.o.   MRN: [id number]    HPI  Patient is seen today to followup her cardiac status. She is also seen to evaluate her post hospitalization. She was discharged from the hospital on November 24, 2013. As part of my evaluation today I have reviewed the H&P and discharge summary. I have reviewed the labs and the echo data. The patient presented with syncope. Her nuclear stress study was abnormal. Therefore, she did undergo cardiac catheterization that showed no significant coronary disease. She was seen by Dr. Graciela Husbands the electrophysiology. He felt that her episode was most likely neurally mediated.  In talking with her today, she is not convinced that she completely passed out. However others were very worried about her. It does appear that there was some weakness loss of consciousness. Since that time she feels fine. She's not having any dizziness or syncope or presyncope.  Allergies  Allergen Reactions  . Codeine     REACTION: vomiting    Current Outpatient Prescriptions  Medication Sig Dispense Refill  . alendronate (FOSAMAX) 70 MG tablet Take 70 mg by mouth every 7 (seven) days. Take with a full glass of water on an empty stomach. On sundays      . atorvastatin (LIPITOR) 80 MG tablet Take 80 mg by mouth daily.        . calcium carbonate (OS-CAL) 600 MG TABS Take 600 mg by mouth 2 (two) times daily with a meal.       . fenofibrate 160 MG tablet Take 160 mg by mouth daily.        . Folic Acid-Vit B6-Vit B12 (FOLBEE) 2.5-25-1 MG TABS Take 1 tablet by mouth daily.        . furosemide (LASIX) 20 MG tablet Take 20 mg by mouth daily.      . hydrochlorothiazide (HYDRODIURIL) 25 MG tablet Take 25 mg by mouth daily.      Marland Kitchen losartan (COZAAR) 100 MG tablet Take 1 tablet (100 mg total) by mouth daily.  30 tablet  6  . meclizine (ANTIVERT) 25 MG tablet Take 25 mg by mouth every 8 (eight) hours as needed for dizziness.      . metoprolol tartrate  (LOPRESSOR) 25 MG tablet Take 0.5 tablets (12.5 mg total) by mouth 2 (two) times daily.  30 tablet  6  . Multiple Vitamin (MULTIVITAMIN WITH MINERALS) TABS tablet Take 1 tablet by mouth daily.      Marland Kitchen omega-3 acid ethyl esters (LOVAZA) 1 G capsule Take 2 capsules (2 g total) by mouth 2 (two) times daily.  120 capsule  6  . potassium chloride SA (K-DUR,KLOR-CON) 20 MEQ tablet Take 20 mEq by mouth 2 (two) times daily.      Marland Kitchen warfarin (COUMADIN) 5 MG tablet Take 2.5-5 mg by mouth daily. Takes 5 mg on Sunday, Tuesday and Thursday and 2.5 mg all other days       No current facility-administered medications for this visit.    History   Social History  . Marital Status: Married    Spouse Name: N/A    Number of Children: N/A  . Years of Education: N/A   Occupational History  . Not on file.   Social History Main Topics  . Smoking status: Never Smoker   . Smokeless tobacco: Never Used  . Alcohol Use: No  . Drug Use: No  . Sexual Activity: Not on file   Other Topics Concern  .  Not on file   Social History Narrative  . No narrative on file    Family History  Problem Relation Age of Onset  . Stroke Father   . Cancer Mother   . Hypertension Mother   . Cancer Sister   . Cancer Sister   . Cancer Sister     Past Medical History  Diagnosis Date  . Chest pain     Cardiolyte...05/2005...borderline abnormalities/Carotid doppler....01/2005...no abnormality..  . Dyslipidemia   . CVA (cerebral infarction)     old Right middle cerebral artery infarct ..probably from atrial fib....no PFO by TEE then  . Aortic insufficiency     mild...echo....11/2005/EF  55-60%  ..echo..11/2005..   . Flushing     niaspan  . Hypotension     unexplained episode in the past...pheo ruled out.  Marland Kitchen. COPD (chronic obstructive pulmonary disease)     by CXray  . Atrial fibrillation     Coumadin therapy  . Ejection fraction     EF 55-60%, echo, 2007  . Carotid bruit     Doppler, 2006, no abnormality  .  Warfarin anticoagulation   . Hypertension   . Shortness of breath     with exertion  . Stroke     X's 2.  No residual per patient.    Past Surgical History  Procedure Laterality Date  . Tributary varicosities of right leg  05/23/2002  . Left hip hemiarthroplasty    . Fracture surgery Left     Pt. fell and Ankle  . Abdominal hysterectomy  1975  . Vein ligation and stripping Right 02/08/2013    Procedure: Segmental excision of painful varicose veins right lower extremity;  Surgeon: Chuck Hinthristopher S Dickson, MD;  [facility];  Service: Vascular;  Laterality: Right;  . Cardiac catheterization  11/23/13    normal coronary arteries, nl LV    Patient Active Problem List   Diagnosis Date Noted  . Dementia 12/01/2013  . Bradycardia 12/01/2013  . Syncope and collapse 11/21/2013  . Varicose veins of lower extremities with other complications 02/02/2013  . Hypertension   . Chest pain, normal coronary arteries   . Dyslipidemia   . CVA (cerebral infarction)   . Aortic insufficiency   . Flushing   . Hypotension   . COPD (chronic obstructive pulmonary disease)   . Chronic atrial fibrillation   . Ejection fraction   . Carotid bruit   . Warfarin anticoagulation     ROS   Patient denies fever, chills, headache, sweats, rash, change in vision, change in hearing, chest pain, cough, nausea or vomiting, urinary symptoms. All other systems are reviewed and are negative.  PHYSICAL EXAM  Patient looks quite good today. She is oriented to person time and place. Affect is normal. Head is atraumatic. Sclera and conjunctiva are normal. There is no jugulovenous distention. Lungs are clear. Respiratory effort is nonlabored. Cardiac exam reveals S1 and S2. The rhythm is irregularly irregular. The rate is controlled. Abdomen is soft. There is no peripheral edema. There no musculoskeletal deformities. There are no skin rashes. Neurologic is grossly intact.  Filed Vitals:   12/05/13 1516  Height: 5\' 10"   (1.778 m)  Weight: 198 lb 12.8 oz (90.175 kg)   EKG is not done today. The patient was monitored in the hospital.  ASSESSMENT & PLAN

## 2013-12-05 NOTE — Assessment & Plan Note (Signed)
The patient is on high-dose atorvastatin. No change in therapy. 

## 2013-12-05 NOTE — Assessment & Plan Note (Signed)
The patient has an old right middle cerebral artery infarct. It is probably related to her atrial fib. She is anticoagulated.

## 2013-12-20 ENCOUNTER — Ambulatory Visit (INDEPENDENT_AMBULATORY_CARE_PROVIDER_SITE_OTHER): Payer: Medicare HMO | Admitting: *Deleted

## 2013-12-20 DIAGNOSIS — I639 Cerebral infarction, unspecified: Secondary | ICD-10-CM

## 2013-12-20 DIAGNOSIS — I482 Chronic atrial fibrillation, unspecified: Secondary | ICD-10-CM

## 2013-12-20 DIAGNOSIS — Z7901 Long term (current) use of anticoagulants: Secondary | ICD-10-CM

## 2013-12-20 DIAGNOSIS — I6350 Cerebral infarction due to unspecified occlusion or stenosis of unspecified cerebral artery: Secondary | ICD-10-CM

## 2013-12-20 DIAGNOSIS — I4891 Unspecified atrial fibrillation: Secondary | ICD-10-CM

## 2013-12-20 LAB — POCT INR: INR: 2.2

## 2014-01-17 ENCOUNTER — Ambulatory Visit (INDEPENDENT_AMBULATORY_CARE_PROVIDER_SITE_OTHER): Payer: Medicare HMO | Admitting: *Deleted

## 2014-01-17 DIAGNOSIS — I639 Cerebral infarction, unspecified: Secondary | ICD-10-CM

## 2014-01-17 DIAGNOSIS — I482 Chronic atrial fibrillation, unspecified: Secondary | ICD-10-CM

## 2014-01-17 DIAGNOSIS — I4891 Unspecified atrial fibrillation: Secondary | ICD-10-CM

## 2014-01-17 DIAGNOSIS — I6350 Cerebral infarction due to unspecified occlusion or stenosis of unspecified cerebral artery: Secondary | ICD-10-CM

## 2014-01-17 DIAGNOSIS — Z7901 Long term (current) use of anticoagulants: Secondary | ICD-10-CM

## 2014-01-17 LAB — POCT INR: INR: 1.6

## 2014-01-31 ENCOUNTER — Ambulatory Visit (INDEPENDENT_AMBULATORY_CARE_PROVIDER_SITE_OTHER): Payer: Commercial Managed Care - HMO | Admitting: *Deleted

## 2014-01-31 DIAGNOSIS — I639 Cerebral infarction, unspecified: Secondary | ICD-10-CM

## 2014-01-31 DIAGNOSIS — I6350 Cerebral infarction due to unspecified occlusion or stenosis of unspecified cerebral artery: Secondary | ICD-10-CM

## 2014-01-31 DIAGNOSIS — I4891 Unspecified atrial fibrillation: Secondary | ICD-10-CM

## 2014-01-31 DIAGNOSIS — Z7901 Long term (current) use of anticoagulants: Secondary | ICD-10-CM

## 2014-01-31 DIAGNOSIS — I482 Chronic atrial fibrillation, unspecified: Secondary | ICD-10-CM

## 2014-01-31 LAB — POCT INR: INR: 3.3

## 2014-02-21 ENCOUNTER — Ambulatory Visit (INDEPENDENT_AMBULATORY_CARE_PROVIDER_SITE_OTHER): Payer: Commercial Managed Care - HMO | Admitting: *Deleted

## 2014-02-21 DIAGNOSIS — Z7901 Long term (current) use of anticoagulants: Secondary | ICD-10-CM

## 2014-02-21 DIAGNOSIS — I639 Cerebral infarction, unspecified: Secondary | ICD-10-CM

## 2014-02-21 DIAGNOSIS — I6350 Cerebral infarction due to unspecified occlusion or stenosis of unspecified cerebral artery: Secondary | ICD-10-CM

## 2014-02-21 DIAGNOSIS — I482 Chronic atrial fibrillation, unspecified: Secondary | ICD-10-CM

## 2014-02-21 DIAGNOSIS — I4891 Unspecified atrial fibrillation: Secondary | ICD-10-CM

## 2014-02-21 LAB — POCT INR: INR: 3

## 2014-03-21 ENCOUNTER — Ambulatory Visit (INDEPENDENT_AMBULATORY_CARE_PROVIDER_SITE_OTHER): Payer: Commercial Managed Care - HMO | Admitting: *Deleted

## 2014-03-21 DIAGNOSIS — I639 Cerebral infarction, unspecified: Secondary | ICD-10-CM

## 2014-03-21 DIAGNOSIS — I6359 Cerebral infarction due to unspecified occlusion or stenosis of other cerebral artery: Secondary | ICD-10-CM

## 2014-03-21 DIAGNOSIS — I6350 Cerebral infarction due to unspecified occlusion or stenosis of unspecified cerebral artery: Secondary | ICD-10-CM

## 2014-03-21 DIAGNOSIS — I4891 Unspecified atrial fibrillation: Secondary | ICD-10-CM

## 2014-03-21 DIAGNOSIS — Z7901 Long term (current) use of anticoagulants: Secondary | ICD-10-CM

## 2014-03-21 DIAGNOSIS — I482 Chronic atrial fibrillation, unspecified: Secondary | ICD-10-CM

## 2014-03-21 LAB — POCT INR: INR: 3

## 2014-04-18 ENCOUNTER — Ambulatory Visit (INDEPENDENT_AMBULATORY_CARE_PROVIDER_SITE_OTHER): Payer: Commercial Managed Care - HMO | Admitting: *Deleted

## 2014-04-18 DIAGNOSIS — I639 Cerebral infarction, unspecified: Secondary | ICD-10-CM

## 2014-04-18 DIAGNOSIS — I6350 Cerebral infarction due to unspecified occlusion or stenosis of unspecified cerebral artery: Secondary | ICD-10-CM

## 2014-04-18 DIAGNOSIS — I6389 Other cerebral infarction: Secondary | ICD-10-CM

## 2014-04-18 DIAGNOSIS — I482 Chronic atrial fibrillation, unspecified: Secondary | ICD-10-CM

## 2014-04-18 DIAGNOSIS — Z7901 Long term (current) use of anticoagulants: Secondary | ICD-10-CM

## 2014-04-18 DIAGNOSIS — I638 Other cerebral infarction: Secondary | ICD-10-CM

## 2014-04-18 DIAGNOSIS — I4891 Unspecified atrial fibrillation: Secondary | ICD-10-CM

## 2014-04-18 LAB — POCT INR: INR: 2.3

## 2014-05-16 ENCOUNTER — Ambulatory Visit (INDEPENDENT_AMBULATORY_CARE_PROVIDER_SITE_OTHER): Payer: Commercial Managed Care - HMO | Admitting: *Deleted

## 2014-05-16 DIAGNOSIS — Z7901 Long term (current) use of anticoagulants: Secondary | ICD-10-CM

## 2014-05-16 DIAGNOSIS — I63239 Cerebral infarction due to unspecified occlusion or stenosis of unspecified carotid arteries: Secondary | ICD-10-CM

## 2014-05-16 DIAGNOSIS — I6350 Cerebral infarction due to unspecified occlusion or stenosis of unspecified cerebral artery: Secondary | ICD-10-CM

## 2014-05-16 DIAGNOSIS — I639 Cerebral infarction, unspecified: Secondary | ICD-10-CM

## 2014-05-16 DIAGNOSIS — I482 Chronic atrial fibrillation, unspecified: Secondary | ICD-10-CM

## 2014-05-16 DIAGNOSIS — I4891 Unspecified atrial fibrillation: Secondary | ICD-10-CM

## 2014-05-16 LAB — POCT INR: INR: 2.8

## 2014-06-01 ENCOUNTER — Encounter (HOSPITAL_COMMUNITY): Payer: Self-pay | Admitting: Cardiovascular Disease

## 2014-06-05 ENCOUNTER — Ambulatory Visit (INDEPENDENT_AMBULATORY_CARE_PROVIDER_SITE_OTHER): Payer: Commercial Managed Care - HMO | Admitting: Cardiology

## 2014-06-05 ENCOUNTER — Encounter: Payer: Self-pay | Admitting: Cardiology

## 2014-06-05 VITALS — BP 174/82 | HR 62 | Ht 70.0 in | Wt 205.0 lb

## 2014-06-05 DIAGNOSIS — R9430 Abnormal result of cardiovascular function study, unspecified: Secondary | ICD-10-CM

## 2014-06-05 DIAGNOSIS — I482 Chronic atrial fibrillation, unspecified: Secondary | ICD-10-CM

## 2014-06-05 DIAGNOSIS — R0989 Other specified symptoms and signs involving the circulatory and respiratory systems: Secondary | ICD-10-CM

## 2014-06-05 DIAGNOSIS — R413 Other amnesia: Secondary | ICD-10-CM

## 2014-06-05 DIAGNOSIS — IMO0002 Reserved for concepts with insufficient information to code with codable children: Secondary | ICD-10-CM

## 2014-06-05 DIAGNOSIS — I10 Essential (primary) hypertension: Secondary | ICD-10-CM

## 2014-06-05 NOTE — Patient Instructions (Signed)
Your physician recommends that you schedule a follow-up appointment in: 6 months. You will receive a reminder letter in the mail in about 4 months reminding you to call and schedule your appointment. If you don't receive this letter, please contact our office. Your physician recommends that you continue on your current medications as directed. Please refer to the Current Medication list given to you today. Your physician has requested that you regularly monitor and record your blood pressure readings at home. Please use the same machine at the same time of day to check your readings and record them. If your top number of your blood pressure is staying greater than 140, please contact our office. Please talk with your family doctor about your memory.

## 2014-06-05 NOTE — Assessment & Plan Note (Signed)
[name] husband is concerned about her memory issues. I have encouraged her to make an appointment with her primary physician to have this evaluated specifically. They were asking me if there is a medication that could help with this.

## 2014-06-05 NOTE — Assessment & Plan Note (Signed)
Systolic pressure is elevated. I've instructed the patient and her husband to check her blood pressure at home and to let us know if the upper number is above 140. She will also be following up with her primary team.

## 2014-06-05 NOTE — Assessment & Plan Note (Signed)
Atrial fib rate is controlled. She is anticoagulated. No change in therapy.

## 2014-06-05 NOTE — Assessment & Plan Note (Signed)
There was question in June, 2015 of some decrease in her left jugular systolic function. When I see her back, I will consider follow-up echo. No change in meds at this time.

## 2014-06-05 NOTE — Progress Notes (Signed)
Patient ID: Wanda Mckenzie, female   DOB: Jan 27, 1936, 78 y.o.   MRN: [id number]    HPI Patient is seen today to follow-up her overall cardiac status. I saw her last in the office June, 2015. At that time she had been in the hospital just before that visit. There was question of syncope but we are not completely sure that she had true syncope. Since that time she's been stable. Her husband tells me that he thinks she's having decreased memory. She acknowledges that her husband tells her this. I have encouraged them to see her primary physician in follow-up soon.  Allergies  Allergen Reactions  . Codeine     REACTION: vomiting    Current Outpatient Prescriptions  Medication Sig Dispense Refill  . alendronate (FOSAMAX) 70 MG tablet Take 70 mg by mouth every 7 (seven) days. Take with a full glass of water on an empty stomach. On sundays    . atorvastatin (LIPITOR) 80 MG tablet Take 80 mg by mouth daily.      . calcium carbonate (OS-CAL) 600 MG TABS Take 600 mg by mouth 2 (two) times daily with a meal.     . fenofibrate 160 MG tablet Take 160 mg by mouth daily.      . Folic Acid-Vit B6-Vit B12 (FOLBEE) 2.5-25-1 MG TABS Take 1 tablet by mouth daily.      . furosemide (LASIX) 20 MG tablet Take 20 mg by mouth daily.    . hydrochlorothiazide (HYDRODIURIL) 25 MG tablet Take 25 mg by mouth daily.    Marland Kitchen losartan (COZAAR) 100 MG tablet Take 1 tablet (100 mg total) by mouth daily. 30 tablet 6  . meclizine (ANTIVERT) 25 MG tablet Take 25 mg by mouth every 8 (eight) hours as needed for dizziness.    . metoprolol tartrate (LOPRESSOR) 25 MG tablet Take 0.5 tablets (12.5 mg total) by mouth 2 (two) times daily. 30 tablet 6  . Multiple Vitamin (MULTIVITAMIN WITH MINERALS) TABS tablet Take 1 tablet by mouth daily.    Marland Kitchen omega-3 acid ethyl esters (LOVAZA) 1 G capsule Take 2 capsules (2 g total) by mouth 2 (two) times daily. 120 capsule 6  . potassium chloride SA (K-DUR,KLOR-CON) 20 MEQ tablet Take 20 mEq by mouth  2 (two) times daily.    Marland Kitchen warfarin (COUMADIN) 5 MG tablet Take 2.5-5 mg by mouth daily. Takes 5 mg on Sunday, Tuesday and Thursday and 2.5 mg all other days     No current facility-administered medications for this visit.    History   Social History  . Marital Status: Married    Spouse Name: N/A    Number of Children: N/A  . Years of Education: N/A   Occupational History  . Not on file.   Social History Main Topics  . Smoking status: Never Smoker   . Smokeless tobacco: Never Used  . Alcohol Use: No  . Drug Use: No  . Sexual Activity: Not on file   Other Topics Concern  . Not on file   Social History Narrative    Family History  Problem Relation Age of Onset  . Stroke Father   . Cancer Mother   . Hypertension Mother   . Cancer Sister   . Cancer Sister   . Cancer Sister     Past Medical History  Diagnosis Date  . Chest pain     Cardiolyte...05/2005...borderline abnormalities/Carotid doppler....01/2005...no abnormality..  . Dyslipidemia   . CVA (cerebral infarction)     old  Right middle cerebral artery infarct ..probably from atrial fib....no PFO by TEE then  . Aortic insufficiency     mild...echo....11/2005/EF  55-60%  ..echo..11/2005..   . Flushing     niaspan  . Hypotension     unexplained episode in the past...pheo ruled out.  Marland Kitchen. COPD (chronic obstructive pulmonary disease)     by CXray  . Atrial fibrillation     Coumadin therapy  . Ejection fraction     EF 55-60%, echo, 2007  . Carotid bruit     Doppler, 2006, no abnormality  . Warfarin anticoagulation   . Hypertension   . Shortness of breath     with exertion  . Stroke     X's 2.  No residual per patient.    Past Surgical History  Procedure Laterality Date  . Tributary varicosities of right leg  05/23/2002  . Left hip hemiarthroplasty    . Fracture surgery Left     Pt. fell and Ankle  . Abdominal hysterectomy  1975  . Vein ligation and stripping Right 02/08/2013    Procedure: Segmental  excision of painful varicose veins right lower extremity;  Surgeon: Chuck Hinthristopher S Dickson, MD;  [facility];  Service: Vascular;  Laterality: Right;  . Cardiac catheterization  11/23/13    normal coronary arteries, nl LV  . Left heart catheterization with coronary angiogram N/A 11/23/2013    Procedure: LEFT HEART CATHETERIZATION WITH CORONARY ANGIOGRAM;  Surgeon: Kathleene Hazelhristopher D McAlhany, MD;  [facility];  Service: Cardiovascular;  Laterality: N/A;    Patient Active Problem List   Diagnosis Date Noted  . Dementia 12/01/2013  . Bradycardia 12/01/2013  . Syncope and collapse 11/21/2013  . Varicose veins of lower extremities with other complications 02/02/2013  . Hypertension   . Chest pain, normal coronary arteries   . Dyslipidemia   . CVA (cerebral infarction)   . Aortic insufficiency   . Flushing   . Hypotension   . COPD (chronic obstructive pulmonary disease)   . Chronic atrial fibrillation   . Ejection fraction   . Carotid bruit   . Warfarin anticoagulation     ROS  Patient denies fever, chills, headache, sweats, rash, change in vision, change in hearing, chest pain, cough, nausea or vomiting, urinary symptoms. All other systems are reviewed and are negative.  PHYSICAL EXAM The patient is oriented to person time and place. Affect is normal. I did not do any complete memory testing. Head is atraumatic. Sclera and conjunctiva are normal. There is no jugulovenous distention. Lungs are clear. Respiratory effort is unlabored. Cardiac exam reveals S1 and S2. The rhythm is irregularly irregular. The rate is controlled. Abdomen is soft. There is no peripheral edema.  Filed Vitals:   06/05/14 0848  BP: 174/82  Pulse: 62  Height: 5\' 10"  (1.778 m)  Weight: 205 lb (92.987 kg)  SpO2: 97%     ASSESSMENT & PLAN

## 2014-06-08 DIAGNOSIS — F0150 Vascular dementia without behavioral disturbance: Secondary | ICD-10-CM | POA: Insufficient documentation

## 2014-06-15 ENCOUNTER — Other Ambulatory Visit: Payer: Self-pay | Admitting: Cardiology

## 2014-06-15 MED ORDER — METOPROLOL TARTRATE 25 MG PO TABS: 12.5000 mg | ORAL_TABLET | Freq: Two times a day (BID) | ORAL | Status: DC

## 2014-06-15 MED ORDER — LOSARTAN POTASSIUM 100 MG PO TABS: 100.0000 mg | ORAL_TABLET | Freq: Every day | ORAL | Status: DC

## 2014-06-27 ENCOUNTER — Ambulatory Visit (INDEPENDENT_AMBULATORY_CARE_PROVIDER_SITE_OTHER): Payer: Commercial Managed Care - HMO | Admitting: *Deleted

## 2014-06-27 DIAGNOSIS — I6350 Cerebral infarction due to unspecified occlusion or stenosis of unspecified cerebral artery: Secondary | ICD-10-CM

## 2014-06-27 DIAGNOSIS — Z7901 Long term (current) use of anticoagulants: Secondary | ICD-10-CM

## 2014-06-27 DIAGNOSIS — I4891 Unspecified atrial fibrillation: Secondary | ICD-10-CM

## 2014-06-27 DIAGNOSIS — I63239 Cerebral infarction due to unspecified occlusion or stenosis of unspecified carotid arteries: Secondary | ICD-10-CM

## 2014-06-27 DIAGNOSIS — I482 Chronic atrial fibrillation, unspecified: Secondary | ICD-10-CM

## 2014-06-27 DIAGNOSIS — I639 Cerebral infarction, unspecified: Secondary | ICD-10-CM

## 2014-06-27 LAB — POCT INR: INR: 2.4

## 2014-08-08 ENCOUNTER — Ambulatory Visit (INDEPENDENT_AMBULATORY_CARE_PROVIDER_SITE_OTHER): Payer: Commercial Managed Care - HMO | Admitting: *Deleted

## 2014-08-08 DIAGNOSIS — I639 Cerebral infarction, unspecified: Secondary | ICD-10-CM

## 2014-08-08 DIAGNOSIS — I482 Chronic atrial fibrillation, unspecified: Secondary | ICD-10-CM

## 2014-08-08 DIAGNOSIS — Z7901 Long term (current) use of anticoagulants: Secondary | ICD-10-CM

## 2014-08-08 DIAGNOSIS — I4891 Unspecified atrial fibrillation: Secondary | ICD-10-CM

## 2014-08-08 DIAGNOSIS — I6350 Cerebral infarction due to unspecified occlusion or stenosis of unspecified cerebral artery: Secondary | ICD-10-CM

## 2014-08-08 LAB — POCT INR: INR: 2

## 2014-09-19 ENCOUNTER — Ambulatory Visit (INDEPENDENT_AMBULATORY_CARE_PROVIDER_SITE_OTHER): Payer: Medicare HMO | Admitting: *Deleted

## 2014-09-19 DIAGNOSIS — I482 Chronic atrial fibrillation, unspecified: Secondary | ICD-10-CM

## 2014-09-19 DIAGNOSIS — I639 Cerebral infarction, unspecified: Secondary | ICD-10-CM

## 2014-09-19 DIAGNOSIS — Z7901 Long term (current) use of anticoagulants: Secondary | ICD-10-CM | POA: Diagnosis not present

## 2014-09-19 DIAGNOSIS — I4891 Unspecified atrial fibrillation: Secondary | ICD-10-CM

## 2014-09-19 DIAGNOSIS — I6350 Cerebral infarction due to unspecified occlusion or stenosis of unspecified cerebral artery: Secondary | ICD-10-CM

## 2014-09-19 LAB — POCT INR: INR: 2

## 2014-10-31 ENCOUNTER — Ambulatory Visit (INDEPENDENT_AMBULATORY_CARE_PROVIDER_SITE_OTHER): Payer: Medicare HMO | Admitting: *Deleted

## 2014-10-31 DIAGNOSIS — I639 Cerebral infarction, unspecified: Secondary | ICD-10-CM

## 2014-10-31 DIAGNOSIS — I6350 Cerebral infarction due to unspecified occlusion or stenosis of unspecified cerebral artery: Secondary | ICD-10-CM

## 2014-10-31 DIAGNOSIS — Z7901 Long term (current) use of anticoagulants: Secondary | ICD-10-CM

## 2014-10-31 DIAGNOSIS — I4891 Unspecified atrial fibrillation: Secondary | ICD-10-CM

## 2014-10-31 DIAGNOSIS — I482 Chronic atrial fibrillation, unspecified: Secondary | ICD-10-CM

## 2014-10-31 LAB — POCT INR: INR: 2.1

## 2014-12-07 ENCOUNTER — Ambulatory Visit (INDEPENDENT_AMBULATORY_CARE_PROVIDER_SITE_OTHER): Payer: Medicare HMO | Admitting: *Deleted

## 2014-12-07 ENCOUNTER — Encounter: Payer: Self-pay | Admitting: Cardiology

## 2014-12-07 ENCOUNTER — Ambulatory Visit (INDEPENDENT_AMBULATORY_CARE_PROVIDER_SITE_OTHER): Payer: Medicare HMO | Admitting: Cardiology

## 2014-12-07 VITALS — BP 158/83 | HR 59 | Ht 70.0 in | Wt 207.4 lb

## 2014-12-07 DIAGNOSIS — I351 Nonrheumatic aortic (valve) insufficiency: Secondary | ICD-10-CM | POA: Diagnosis not present

## 2014-12-07 DIAGNOSIS — I4891 Unspecified atrial fibrillation: Secondary | ICD-10-CM | POA: Diagnosis not present

## 2014-12-07 DIAGNOSIS — I6350 Cerebral infarction due to unspecified occlusion or stenosis of unspecified cerebral artery: Secondary | ICD-10-CM

## 2014-12-07 DIAGNOSIS — R0989 Other specified symptoms and signs involving the circulatory and respiratory systems: Secondary | ICD-10-CM

## 2014-12-07 DIAGNOSIS — R072 Precordial pain: Secondary | ICD-10-CM

## 2014-12-07 DIAGNOSIS — R413 Other amnesia: Secondary | ICD-10-CM

## 2014-12-07 DIAGNOSIS — I482 Chronic atrial fibrillation, unspecified: Secondary | ICD-10-CM

## 2014-12-07 DIAGNOSIS — R9430 Abnormal result of cardiovascular function study, unspecified: Secondary | ICD-10-CM

## 2014-12-07 DIAGNOSIS — Z7901 Long term (current) use of anticoagulants: Secondary | ICD-10-CM

## 2014-12-07 DIAGNOSIS — I639 Cerebral infarction, unspecified: Secondary | ICD-10-CM

## 2014-12-07 DIAGNOSIS — I10 Essential (primary) hypertension: Secondary | ICD-10-CM

## 2014-12-07 LAB — POCT INR: INR: 2

## 2014-12-07 NOTE — Patient Instructions (Signed)
Your physician recommends that you continue on your current medications as directed. Please refer to the Current Medication list given to you today. Your physician recommends that you schedule a follow-up appointment in: 6 months. You will receive a reminder letter in the mail in about 4 months reminding you to call and schedule your appointment. If you don't receive this letter, please contact our office. 

## 2014-12-07 NOTE — Assessment & Plan Note (Signed)
This is being managed by her primary physician. She is on Aricept

## 2014-12-07 NOTE — Progress Notes (Signed)
Cardiology Office Note   Date:  12/07/2014   ID:  Wanda Mckenzie, DOB 1935/08/29, MRN [id number]  PCP:  Josue Hector, MD  Cardiologist:  Willa Rough, MD   Chief Complaint  Patient presents with  . Appointment    Follow-up overall cardiac status.      History of Present Illness: Wanda Mckenzie is a 79 y.o. female who presents today to follow-up her overall cardiac status including atrial fibrillation.. I saw her last December, 2015. Historically she has not had proven coronary disease. There was question on her last evaluation of some decrease in ejection fraction. I have not yet done a follow-up echo. She is stable. She is not having any significant symptoms. She has had some memory changes and this is being treated by her primary physician.    Past Medical History  Diagnosis Date  . Chest pain     Cardiolyte...05/2005...borderline abnormalities/Carotid doppler....01/2005...no abnormality..  . Dyslipidemia   . CVA (cerebral infarction)     old Right middle cerebral artery infarct ..probably from atrial fib....no PFO by TEE then  . Aortic insufficiency     mild...echo....11/2005/EF  55-60%  ..echo..11/2005..   . Flushing     niaspan  . Hypotension     unexplained episode in the past...pheo ruled out.  Marland Kitchen COPD (chronic obstructive pulmonary disease)     by CXray  . Atrial fibrillation     Coumadin therapy  . Ejection fraction     EF 55-60%, echo, 2007  . Carotid bruit     Doppler, 2006, no abnormality  . Warfarin anticoagulation   . Hypertension   . Shortness of breath     with exertion  . Stroke     X's 2.  No residual per patient.    Past Surgical History  Procedure Laterality Date  . Tributary varicosities of right leg  05/23/2002  . Left hip hemiarthroplasty    . Fracture surgery Left     Pt. fell and Ankle  . Abdominal hysterectomy  1975  . Vein ligation and stripping Right 02/08/2013    Procedure: Segmental excision of painful varicose veins  right lower extremity;  Surgeon: Chuck Hint, MD;  Location: [facility] OR;  Service: Vascular;  Laterality: Right;  . Cardiac catheterization  11/23/13    normal coronary arteries, nl LV  . Left heart catheterization with coronary angiogram N/A 11/23/2013    Procedure: LEFT HEART CATHETERIZATION WITH CORONARY ANGIOGRAM;  Surgeon: Kathleene Hazel, MD;  Location: [facility] CATH LAB;  Service: Cardiovascular;  Laterality: N/A;    Patient Active Problem List   Diagnosis Date Noted  . Memory change 06/05/2014  . Dementia 12/01/2013  . Bradycardia 12/01/2013  . Syncope and collapse 11/21/2013  . Varicose veins of lower extremities with other complications 02/02/2013  . Hypertension   . Chest pain, normal coronary arteries   . Dyslipidemia   . CVA (cerebral infarction)   . Aortic insufficiency   . Flushing   . Hypotension   . COPD (chronic obstructive pulmonary disease)   . Chronic atrial fibrillation   . Ejection fraction   . Carotid bruit   . Warfarin anticoagulation       Current Outpatient Prescriptions  Medication Sig Dispense Refill  . alendronate (FOSAMAX) 70 MG tablet Take 70 mg by mouth every 7 (seven) days. Take with a full glass of water on an empty stomach. On sundays    . amLODipine (NORVASC) 5 MG tablet Take 5 mg by mouth  daily.    . atorvastatin (LIPITOR) 80 MG tablet Take 80 mg by mouth daily.      . calcium carbonate (OS-CAL) 600 MG TABS Take 600 mg by mouth 2 (two) times daily with a meal.     . donepezil (ARICEPT) 10 MG tablet Take 10 mg by mouth at bedtime.    . fenofibrate 160 MG tablet Take 160 mg by mouth daily.      . Folic Acid-Vit B6-Vit B12 (FOLBEE) 2.5-25-1 MG TABS Take 1 tablet by mouth daily.      . furosemide (LASIX) 20 MG tablet Take 20 mg by mouth daily.    . hydrochlorothiazide (HYDRODIURIL) 25 MG tablet Take 25 mg by mouth daily.    Marland Kitchen losartan (COZAAR) 100 MG tablet Take 1 tablet (100 mg total) by mouth daily. 30 tablet 6  . meclizine (ANTIVERT) 25  MG tablet Take 25 mg by mouth every 8 (eight) hours as needed for dizziness.    . metoprolol tartrate (LOPRESSOR) 25 MG tablet Take 0.5 tablets (12.5 mg total) by mouth 2 (two) times daily. 30 tablet 6  . Multiple Vitamin (MULTIVITAMIN WITH MINERALS) TABS tablet Take 1 tablet by mouth daily.    Marland Kitchen omega-3 acid ethyl esters (LOVAZA) 1 G capsule Take 2 capsules (2 g total) by mouth 2 (two) times daily. 120 capsule 6  . potassium chloride SA (K-DUR,KLOR-CON) 20 MEQ tablet Take 20 mEq by mouth 2 (two) times daily.    Marland Kitchen warfarin (COUMADIN) 5 MG tablet Take 2.5-5 mg by mouth daily. Takes 5 mg on Sunday, Tuesday and Thursday and 2.5 mg all other days     No current facility-administered medications for this visit.    Allergies:   Codeine    Social History:  The patient  reports that she has never smoked. She has never used smokeless tobacco. She reports that she does not drink alcohol or use illicit drugs.   Family History:  [name] family history includes Cancer in her mother, sister, sister, and sister; Hypertension in her mother; Stroke in her father.    ROS:  Please see the history of present illness.    Patient denies fever, chills, headache, sweats, rash, change in vision, change in hearing, chest pain, cough, nausea or vomiting, urinary symptoms. She does have some mild pain in her right hip related to an old injury. All other systems are reviewed and are negative.    PHYSICAL EXAM: VS:  BP 158/83 mmHg  Pulse 59  Ht 5\' 10"  (1.778 m)  Wt 207 lb 6.4 oz (94.076 kg)  BMI 29.76 kg/m2  SpO2 95% , The patient is here with her husband. She is oriented to person time and place. Affect is normal. Head is atraumatic. Sclera and conjunctiva are normal. There is no jugulovenous distention. Lungs are clear. Respiratory effort is not labored. Cardiac exam reveals an S1 and S2. The rhythm is irregularly irregular. The rate is controlled. There are no musculoskeletal deformities. There are no skin  rashes.  EKG:   EKG is done today. She has atrial fib with a controlled rate. There are nonspecific ST-T wave changes.   Recent Labs: No results found for requested labs within last 365 days.    Lipid Panel    Component Value Date/Time   CHOL 118 11/21/2013 0420   TRIG 100 11/21/2013 0420   HDL 44 11/21/2013 0420   CHOLHDL 2.7 11/21/2013 0420   VLDL 20 11/21/2013 0420   LDLCALC 54 11/21/2013 0420  Wt Readings from Last 3 Encounters:  12/07/14 207 lb 6.4 oz (94.076 kg)  06/05/14 205 lb (92.987 kg)  12/05/13 198 lb 12.8 oz (90.175 kg)      Current medicines are reviewed  [name] husband understands her medications.     ASSESSMENT AND PLAN:

## 2014-12-07 NOTE — Assessment & Plan Note (Signed)
Anticoagulation continues for her atrial fibrillation.

## 2014-12-07 NOTE — Assessment & Plan Note (Signed)
Ejection fraction was in the range of 45% in June, 2015. Consideration could be given to a follow-up echo when she seen back in 6 months.

## 2014-12-07 NOTE — Assessment & Plan Note (Signed)
The patient underwent cardiac catheterization June, 2015. There was no significant coronary disease. No further workup.

## 2014-12-07 NOTE — Assessment & Plan Note (Signed)
Systolic blood pressure is mildly elevated today. She tells me that sure blood pressure was stable when she saw her primary physician very recently. I will leave blood pressure control up to her primary doctor.

## 2014-12-07 NOTE — Assessment & Plan Note (Signed)
She has chronic atrial fibrillation. Rate is controlled. She is anticoagulated. No change in therapy.

## 2014-12-07 NOTE — Assessment & Plan Note (Signed)
She had mild/moderate aortic insufficiency June, 2015. Consideration could be given to a follow-up echo when she seen back in 6 months.

## 2015-01-09 ENCOUNTER — Other Ambulatory Visit: Payer: Self-pay | Admitting: Cardiology

## 2015-01-17 ENCOUNTER — Other Ambulatory Visit: Payer: Self-pay

## 2015-01-17 MED ORDER — OMEGA-3-ACID ETHYL ESTERS 1 G PO CAPS: 2.0000 g | ORAL_CAPSULE | Freq: Two times a day (BID) | ORAL | Status: DC

## 2015-01-18 ENCOUNTER — Ambulatory Visit (INDEPENDENT_AMBULATORY_CARE_PROVIDER_SITE_OTHER): Payer: Medicare HMO | Admitting: *Deleted

## 2015-01-18 DIAGNOSIS — I482 Chronic atrial fibrillation, unspecified: Secondary | ICD-10-CM

## 2015-01-18 DIAGNOSIS — I639 Cerebral infarction, unspecified: Secondary | ICD-10-CM

## 2015-01-18 DIAGNOSIS — I4891 Unspecified atrial fibrillation: Secondary | ICD-10-CM

## 2015-01-18 DIAGNOSIS — I6350 Cerebral infarction due to unspecified occlusion or stenosis of unspecified cerebral artery: Secondary | ICD-10-CM

## 2015-01-18 DIAGNOSIS — Z7901 Long term (current) use of anticoagulants: Secondary | ICD-10-CM

## 2015-01-18 LAB — POCT INR: INR: 2.1

## 2015-03-01 ENCOUNTER — Ambulatory Visit (INDEPENDENT_AMBULATORY_CARE_PROVIDER_SITE_OTHER): Payer: Medicare HMO | Admitting: *Deleted

## 2015-03-01 DIAGNOSIS — Z7901 Long term (current) use of anticoagulants: Secondary | ICD-10-CM | POA: Diagnosis not present

## 2015-03-01 DIAGNOSIS — I639 Cerebral infarction, unspecified: Secondary | ICD-10-CM

## 2015-03-01 DIAGNOSIS — I6350 Cerebral infarction due to unspecified occlusion or stenosis of unspecified cerebral artery: Secondary | ICD-10-CM

## 2015-03-01 DIAGNOSIS — I4891 Unspecified atrial fibrillation: Secondary | ICD-10-CM

## 2015-03-01 DIAGNOSIS — I482 Chronic atrial fibrillation, unspecified: Secondary | ICD-10-CM

## 2015-03-01 LAB — POCT INR: INR: 2.4

## 2015-04-17 ENCOUNTER — Ambulatory Visit (INDEPENDENT_AMBULATORY_CARE_PROVIDER_SITE_OTHER): Payer: Medicare HMO | Admitting: *Deleted

## 2015-04-17 DIAGNOSIS — I482 Chronic atrial fibrillation, unspecified: Secondary | ICD-10-CM

## 2015-04-17 DIAGNOSIS — I6350 Cerebral infarction due to unspecified occlusion or stenosis of unspecified cerebral artery: Secondary | ICD-10-CM

## 2015-04-17 DIAGNOSIS — I639 Cerebral infarction, unspecified: Secondary | ICD-10-CM | POA: Diagnosis not present

## 2015-04-17 DIAGNOSIS — Z7901 Long term (current) use of anticoagulants: Secondary | ICD-10-CM | POA: Diagnosis not present

## 2015-04-17 DIAGNOSIS — I4891 Unspecified atrial fibrillation: Secondary | ICD-10-CM | POA: Diagnosis not present

## 2015-04-17 LAB — POCT INR: INR: 2.8

## 2015-05-07 ENCOUNTER — Other Ambulatory Visit: Payer: Self-pay | Admitting: Cardiology

## 2015-05-29 ENCOUNTER — Ambulatory Visit (INDEPENDENT_AMBULATORY_CARE_PROVIDER_SITE_OTHER): Payer: Medicare HMO | Admitting: *Deleted

## 2015-05-29 DIAGNOSIS — I482 Chronic atrial fibrillation, unspecified: Secondary | ICD-10-CM

## 2015-05-29 DIAGNOSIS — I6350 Cerebral infarction due to unspecified occlusion or stenosis of unspecified cerebral artery: Secondary | ICD-10-CM | POA: Diagnosis not present

## 2015-05-29 DIAGNOSIS — Z7901 Long term (current) use of anticoagulants: Secondary | ICD-10-CM | POA: Diagnosis not present

## 2015-05-29 DIAGNOSIS — I4891 Unspecified atrial fibrillation: Secondary | ICD-10-CM | POA: Diagnosis not present

## 2015-05-29 DIAGNOSIS — I639 Cerebral infarction, unspecified: Secondary | ICD-10-CM

## 2015-05-29 LAB — POCT INR: INR: 2

## 2015-07-03 ENCOUNTER — Encounter: Payer: Self-pay | Admitting: Cardiology

## 2015-07-03 ENCOUNTER — Ambulatory Visit (INDEPENDENT_AMBULATORY_CARE_PROVIDER_SITE_OTHER): Payer: Medicare HMO | Admitting: *Deleted

## 2015-07-03 ENCOUNTER — Ambulatory Visit (INDEPENDENT_AMBULATORY_CARE_PROVIDER_SITE_OTHER): Payer: Medicare HMO | Admitting: Cardiology

## 2015-07-03 VITALS — BP 170/75 | HR 79 | Ht 70.0 in | Wt 209.4 lb

## 2015-07-03 DIAGNOSIS — I482 Chronic atrial fibrillation, unspecified: Secondary | ICD-10-CM

## 2015-07-03 DIAGNOSIS — IMO0001 Reserved for inherently not codable concepts without codable children: Secondary | ICD-10-CM

## 2015-07-03 DIAGNOSIS — E785 Hyperlipidemia, unspecified: Secondary | ICD-10-CM | POA: Diagnosis not present

## 2015-07-03 DIAGNOSIS — I4891 Unspecified atrial fibrillation: Secondary | ICD-10-CM

## 2015-07-03 DIAGNOSIS — Z7901 Long term (current) use of anticoagulants: Secondary | ICD-10-CM

## 2015-07-03 DIAGNOSIS — I10 Essential (primary) hypertension: Secondary | ICD-10-CM

## 2015-07-03 DIAGNOSIS — I639 Cerebral infarction, unspecified: Secondary | ICD-10-CM | POA: Diagnosis not present

## 2015-07-03 DIAGNOSIS — Z0389 Encounter for observation for other suspected diseases and conditions ruled out: Secondary | ICD-10-CM | POA: Diagnosis not present

## 2015-07-03 DIAGNOSIS — I6350 Cerebral infarction due to unspecified occlusion or stenosis of unspecified cerebral artery: Secondary | ICD-10-CM

## 2015-07-03 LAB — POCT INR: INR: 2.8

## 2015-07-03 NOTE — Patient Instructions (Signed)
Your physician recommends that you continue on your current medications as directed. Please refer to the Current Medication list given to you today. Your physician recommends that you schedule a follow-up appointment in: 6 months. You will receive a reminder letter in the mail in about 4 months reminding you to call and schedule your appointment. If you don't receive this letter, please contact our office. 

## 2015-07-03 NOTE — Progress Notes (Signed)
Cardiology Office Note  Date: 07/03/2015   ID: Wanda Mckenzie, DOB Oct 01, 1935, MRN [id number]  PCP: Josue Hector, MD  Primary Cardiologist: Nona Dell, MD   Chief Complaint  Patient presents with  . Atrial Fibrillation    History of Present Illness: Wanda Mckenzie is a 80 y.o. female for patient of Dr. Myrtis Ser, now establishing follow-up with me. She last saw Dr. Myrtis Ser in June 2016. I reviewed her records and updated the chart. She is here for a routine visit with her husband today. I introduced myself to both, her husband declined to shake my hand, no explanation requested.  I asked Wanda Mckenzie about her general status since her last visit with Dr. Myrtis Ser. She did not have any specific complaints or questions, denied any hospitalizations. She does not endorse any exertional chest pain or palpitations. In reviewing her cardiac history I see that she had documentation of normal coronary arteries in June 2015.  Most recent lab work from October 2016 is outlined below showing good LDL control.  She continues on Coumadin with follow-up in the anticoagulation clinic, denies any major bleeding problems. Last INR was 2.0.  Blood pressure is elevated today, she reports compliance with her medications.  Past Medical History  Diagnosis Date  . Dyslipidemia   . History of stroke     Right MCA distribution   . COPD (chronic obstructive pulmonary disease) (HCC)   . Atrial fibrillation (HCC)   . Essential hypertension   . History of cardiac catheterization     Normal coronaries June 2015    Past Surgical History  Procedure Laterality Date  . Tributary varicosities of right leg  05/23/2002  . Left hip hemiarthroplasty    . Fracture surgery Left   . Abdominal hysterectomy  1975  . Vein ligation and stripping Right 02/08/2013    Procedure: Segmental excision of painful varicose veins right lower extremity;  Surgeon: Chuck Hint, MD;  Location: [facility] OR;  Service:  Vascular;  Laterality: Right;  . Left heart catheterization with coronary angiogram N/A 11/23/2013    Procedure: LEFT HEART CATHETERIZATION WITH CORONARY ANGIOGRAM;  Surgeon: Kathleene Hazel, MD;  Location: [facility] CATH LAB;  Service: Cardiovascular;  Laterality: N/A;    Current Outpatient Prescriptions  Medication Sig Dispense Refill  . alendronate (FOSAMAX) 70 MG tablet Take 70 mg by mouth every 7 (seven) days. Take with a full glass of water on an empty stomach. On sundays    . amLODipine (NORVASC) 5 MG tablet Take 5 mg by mouth daily.    Marland Kitchen atorvastatin (LIPITOR) 80 MG tablet Take 80 mg by mouth daily.      . calcium carbonate (OS-CAL) 600 MG TABS Take 600 mg by mouth 2 (two) times daily with a meal.     . donepezil (ARICEPT) 10 MG tablet Take 10 mg by mouth at bedtime.    . fenofibrate 160 MG tablet Take 160 mg by mouth daily.      . Folic Acid-Vit B6-Vit B12 (FOLBEE) 2.5-25-1 MG TABS Take 1 tablet by mouth daily.      . furosemide (LASIX) 20 MG tablet Take 20 mg by mouth daily.    . hydrochlorothiazide (HYDRODIURIL) 25 MG tablet Take 25 mg by mouth daily.    Marland Kitchen losartan (COZAAR) 100 MG tablet TAKE ONE TABLET BY MOUTH ONCE DAILY 30 tablet 3  . meclizine (ANTIVERT) 25 MG tablet Take 25 mg by mouth every 8 (eight) hours as needed for dizziness.    Marland Kitchen  metoprolol tartrate (LOPRESSOR) 25 MG tablet TAKE ONE-HALF TABLET BY MOUTH TWICE DAILY 30 tablet 3  . Multiple Vitamin (MULTIVITAMIN WITH MINERALS) TABS tablet Take 1 tablet by mouth daily.    Marland Kitchen omega-3 acid ethyl esters (LOVAZA) 1 G capsule Take 2 capsules (2 g total) by mouth 2 (two) times daily. 120 capsule 2  . potassium chloride SA (K-DUR,KLOR-CON) 20 MEQ tablet Take 20 mEq by mouth 2 (two) times daily.    Marland Kitchen warfarin (COUMADIN) 5 MG tablet Take 2.5-5 mg by mouth daily. Takes 5 mg on Sunday, Tuesday and Thursday and 2.5 mg all other days     No current facility-administered medications for this visit.   Allergies:  Codeine   Social  History: The patient  reports that she has never smoked. She has never used smokeless tobacco. She reports that she does not drink alcohol or use illicit drugs.   ROS:  Please see the history of present illness. Otherwise, complete review of systems is positive for none.  All other systems are reviewed and negative.   Physical Exam: VS:  BP 170/75 mmHg  Pulse 79  Ht 5\' 10"  (1.778 m)  Wt 209 lb 6.4 oz (94.983 kg)  BMI 30.05 kg/m2  SpO2 95%, BMI Body mass index is 30.05 kg/(m^2).  Wt Readings from Last 3 Encounters:  07/03/15 209 lb 6.4 oz (94.983 kg)  12/07/14 207 lb 6.4 oz (94.076 kg)  06/05/14 205 lb (92.987 kg)    General: Patient appears comfortable at rest. HEENT: Conjunctiva and lids normal, oropharynx clear. Neck: Supple, no elevated JVP or carotid bruits, no thyromegaly. Lungs: Clear to auscultation, nonlabored breathing at rest. Cardiac:  Irregularly irregular, no S3,  soft systolic murmur, no pericardial rub. Abdomen: Soft, nontender, bowel sounds present, no guarding or rebound. Extremities: Trace ankle edema, distal pulses 2+. Skin: Warm and dry. Musculoskeletal: No kyphosis. Neuropsychiatric: Alert and oriented x3, affect grossly appropriate.  ECG: Tracing from 12/07/2014 showed rate-controlled atrial fibrillation with nonspecific ST changes.  Recent Labwork:  October 2016: Potassium 3.8, BUN 21, AST 31, ALT 22, creatinine 1.1 , cholesterol 121, triglycerides 119, HDL 45, LDL 52 , hemoglobin 13.9, platelets 311  Other Studies Reviewed Today:  Echocardiogram 11/21/2013: Study Conclusions  - Left ventricle: The cavity size was at the upper limits of normal. Wall thickness was normal. Systolic function was mildly to moderately reduced. The estimated ejection fraction was in the range of 40% to 45%. Diffuse hypokinesis. Doppler parameters are consistent with high ventricular filling pressure. - Aortic valve: There was mild to moderate regurgitation. - Mitral  valve: There was mild regurgitation. - Left atrium: The atrium was mildly dilated. - Right atrium: The atrium was mildly to moderately dilated.  Assessment and Plan:  1. Chronic atrial fibrillation by history with previous history of stroke. She continues on long-term anticoagulation with Coumadin and is followed in the Coumadin clinic. No reported palpitations. Current heart rate control regimen includes Lopressor.  No changes were made today.  2. History of normal coronary arteries by cardiac catheterization in June 2015.  3.  Essential hypertension, blood pressure is elevated today.  She follows with Dr. Lysbeth Galas. Current regimen includes Norvasc, HCTZ , Lopressor, and Cozaar. Also on Lasix with potassium supplements. Could consider changing HCTZ to chlorthalidone, or perhaps further uptitrating Norvasc.  4. Hyperlipidemia , on Lipitor, last LDL 52.  Current medicines were reviewed with the patient today.  Disposition: FU with me in 6 months.   Signed, Jonelle Sidle, MD, [facility]  07/03/2015 2:00 PM    [facility] at [facility] [address] [place], [place], [place] [address] Phone: [phone]; Fax: [phone]

## 2015-08-14 ENCOUNTER — Ambulatory Visit (INDEPENDENT_AMBULATORY_CARE_PROVIDER_SITE_OTHER): Payer: Medicare HMO | Admitting: *Deleted

## 2015-08-14 DIAGNOSIS — I482 Chronic atrial fibrillation, unspecified: Secondary | ICD-10-CM

## 2015-08-14 DIAGNOSIS — I639 Cerebral infarction, unspecified: Secondary | ICD-10-CM | POA: Diagnosis not present

## 2015-08-14 DIAGNOSIS — I4891 Unspecified atrial fibrillation: Secondary | ICD-10-CM | POA: Diagnosis not present

## 2015-08-14 DIAGNOSIS — Z7901 Long term (current) use of anticoagulants: Secondary | ICD-10-CM

## 2015-08-14 DIAGNOSIS — I6350 Cerebral infarction due to unspecified occlusion or stenosis of unspecified cerebral artery: Secondary | ICD-10-CM | POA: Diagnosis not present

## 2015-08-14 LAB — POCT INR: INR: 2.1

## 2015-09-04 ENCOUNTER — Other Ambulatory Visit: Payer: Self-pay | Admitting: Cardiology

## 2015-09-11 ENCOUNTER — Other Ambulatory Visit: Payer: Self-pay | Admitting: Cardiology

## 2015-09-25 ENCOUNTER — Ambulatory Visit (INDEPENDENT_AMBULATORY_CARE_PROVIDER_SITE_OTHER): Payer: Medicare HMO | Admitting: *Deleted

## 2015-09-25 DIAGNOSIS — I482 Chronic atrial fibrillation, unspecified: Secondary | ICD-10-CM

## 2015-09-25 DIAGNOSIS — I4891 Unspecified atrial fibrillation: Secondary | ICD-10-CM

## 2015-09-25 DIAGNOSIS — I6350 Cerebral infarction due to unspecified occlusion or stenosis of unspecified cerebral artery: Secondary | ICD-10-CM | POA: Diagnosis not present

## 2015-09-25 DIAGNOSIS — I639 Cerebral infarction, unspecified: Secondary | ICD-10-CM | POA: Diagnosis not present

## 2015-09-25 DIAGNOSIS — Z7901 Long term (current) use of anticoagulants: Secondary | ICD-10-CM | POA: Diagnosis not present

## 2015-09-25 LAB — POCT INR: INR: 2.7

## 2015-11-06 ENCOUNTER — Ambulatory Visit (INDEPENDENT_AMBULATORY_CARE_PROVIDER_SITE_OTHER): Payer: Medicare HMO | Admitting: *Deleted

## 2015-11-06 DIAGNOSIS — I4891 Unspecified atrial fibrillation: Secondary | ICD-10-CM

## 2015-11-06 DIAGNOSIS — I482 Chronic atrial fibrillation, unspecified: Secondary | ICD-10-CM

## 2015-11-06 DIAGNOSIS — I639 Cerebral infarction, unspecified: Secondary | ICD-10-CM | POA: Diagnosis not present

## 2015-11-06 DIAGNOSIS — I6350 Cerebral infarction due to unspecified occlusion or stenosis of unspecified cerebral artery: Secondary | ICD-10-CM | POA: Diagnosis not present

## 2015-11-06 DIAGNOSIS — Z7901 Long term (current) use of anticoagulants: Secondary | ICD-10-CM

## 2015-11-06 LAB — POCT INR: INR: 2.7

## 2015-12-13 ENCOUNTER — Encounter: Payer: Self-pay | Admitting: *Deleted

## 2015-12-27 ENCOUNTER — Ambulatory Visit (INDEPENDENT_AMBULATORY_CARE_PROVIDER_SITE_OTHER): Payer: Medicare HMO | Admitting: *Deleted

## 2015-12-27 DIAGNOSIS — I639 Cerebral infarction, unspecified: Secondary | ICD-10-CM

## 2015-12-27 DIAGNOSIS — Z7901 Long term (current) use of anticoagulants: Secondary | ICD-10-CM | POA: Diagnosis not present

## 2015-12-27 DIAGNOSIS — I482 Chronic atrial fibrillation, unspecified: Secondary | ICD-10-CM

## 2015-12-27 DIAGNOSIS — I6350 Cerebral infarction due to unspecified occlusion or stenosis of unspecified cerebral artery: Secondary | ICD-10-CM | POA: Diagnosis not present

## 2015-12-27 DIAGNOSIS — I4891 Unspecified atrial fibrillation: Secondary | ICD-10-CM

## 2015-12-27 LAB — POCT INR: INR: 3

## 2016-01-08 ENCOUNTER — Other Ambulatory Visit: Payer: Self-pay | Admitting: *Deleted

## 2016-01-08 MED ORDER — METOPROLOL TARTRATE 25 MG PO TABS: ORAL_TABLET | ORAL | Status: DC

## 2016-02-07 ENCOUNTER — Ambulatory Visit (INDEPENDENT_AMBULATORY_CARE_PROVIDER_SITE_OTHER): Payer: Medicare HMO | Admitting: *Deleted

## 2016-02-07 DIAGNOSIS — I639 Cerebral infarction, unspecified: Secondary | ICD-10-CM

## 2016-02-07 DIAGNOSIS — Z7901 Long term (current) use of anticoagulants: Secondary | ICD-10-CM

## 2016-02-07 DIAGNOSIS — I482 Chronic atrial fibrillation, unspecified: Secondary | ICD-10-CM

## 2016-02-07 DIAGNOSIS — I4891 Unspecified atrial fibrillation: Secondary | ICD-10-CM

## 2016-02-07 DIAGNOSIS — I6350 Cerebral infarction due to unspecified occlusion or stenosis of unspecified cerebral artery: Secondary | ICD-10-CM

## 2016-02-07 LAB — POCT INR: INR: 3.4

## 2016-03-11 ENCOUNTER — Ambulatory Visit (INDEPENDENT_AMBULATORY_CARE_PROVIDER_SITE_OTHER): Payer: Medicare HMO | Admitting: *Deleted

## 2016-03-11 DIAGNOSIS — I639 Cerebral infarction, unspecified: Secondary | ICD-10-CM | POA: Diagnosis not present

## 2016-03-11 DIAGNOSIS — Z7901 Long term (current) use of anticoagulants: Secondary | ICD-10-CM | POA: Diagnosis not present

## 2016-03-11 DIAGNOSIS — I4891 Unspecified atrial fibrillation: Secondary | ICD-10-CM

## 2016-03-11 DIAGNOSIS — I6350 Cerebral infarction due to unspecified occlusion or stenosis of unspecified cerebral artery: Secondary | ICD-10-CM

## 2016-03-11 DIAGNOSIS — I482 Chronic atrial fibrillation, unspecified: Secondary | ICD-10-CM

## 2016-03-11 LAB — POCT INR: INR: 3.5

## 2016-03-19 ENCOUNTER — Telehealth: Payer: Self-pay | Admitting: *Deleted

## 2016-03-19 NOTE — Telephone Encounter (Signed)
Mrs. Dentice called stating that she is going to start going to Dr. Lysbeth Galas for her coumdin checks.  Requested that Vashti Hey R.N. Be notified.

## 2016-03-24 ENCOUNTER — Ambulatory Visit (INDEPENDENT_AMBULATORY_CARE_PROVIDER_SITE_OTHER): Payer: Medicare HMO | Admitting: *Deleted

## 2016-03-24 DIAGNOSIS — I482 Chronic atrial fibrillation, unspecified: Secondary | ICD-10-CM

## 2016-03-24 DIAGNOSIS — Z7901 Long term (current) use of anticoagulants: Secondary | ICD-10-CM

## 2016-06-19 DIAGNOSIS — E785 Hyperlipidemia, unspecified: Secondary | ICD-10-CM | POA: Insufficient documentation

## 2016-06-19 DIAGNOSIS — E782 Mixed hyperlipidemia: Secondary | ICD-10-CM | POA: Insufficient documentation

## 2016-09-04 ENCOUNTER — Other Ambulatory Visit: Payer: Self-pay | Admitting: *Deleted

## 2016-09-04 MED ORDER — METOPROLOL TARTRATE 25 MG PO TABS: ORAL_TABLET | ORAL | 0 refills | Status: DC

## 2016-10-07 DIAGNOSIS — Z7901 Long term (current) use of anticoagulants: Secondary | ICD-10-CM | POA: Insufficient documentation

## 2017-03-10 ENCOUNTER — Emergency Department (HOSPITAL_COMMUNITY): Payer: Medicare Other

## 2017-03-10 ENCOUNTER — Encounter (HOSPITAL_COMMUNITY): Payer: Self-pay | Admitting: Emergency Medicine

## 2017-03-10 ENCOUNTER — Emergency Department (HOSPITAL_COMMUNITY)
Admission: EM | Admit: 2017-03-10 | Discharge: 2017-03-10 | Disposition: A | Discharge status: Home / Self Care | Payer: Medicare Other | Attending: Emergency Medicine | Admitting: Emergency Medicine

## 2017-03-10 DIAGNOSIS — R0602 Shortness of breath: Secondary | ICD-10-CM | POA: Diagnosis present

## 2017-03-10 DIAGNOSIS — Z8673 Personal history of transient ischemic attack (TIA), and cerebral infarction without residual deficits: Secondary | ICD-10-CM | POA: Insufficient documentation

## 2017-03-10 DIAGNOSIS — F0390 Unspecified dementia without behavioral disturbance: Secondary | ICD-10-CM | POA: Insufficient documentation

## 2017-03-10 DIAGNOSIS — Z7901 Long term (current) use of anticoagulants: Secondary | ICD-10-CM | POA: Insufficient documentation

## 2017-03-10 DIAGNOSIS — J449 Chronic obstructive pulmonary disease, unspecified: Secondary | ICD-10-CM | POA: Diagnosis not present

## 2017-03-10 DIAGNOSIS — Z79899 Other long term (current) drug therapy: Secondary | ICD-10-CM | POA: Diagnosis not present

## 2017-03-10 DIAGNOSIS — R600 Localized edema: Secondary | ICD-10-CM | POA: Insufficient documentation

## 2017-03-10 DIAGNOSIS — I10 Essential (primary) hypertension: Secondary | ICD-10-CM | POA: Diagnosis not present

## 2017-03-10 LAB — CBC
HEMATOCRIT: 41.9 % (ref 36.0–46.0)
Hemoglobin: 13.7 g/dL (ref 12.0–15.0)
MCH: 31.1 pg (ref 26.0–34.0)
MCHC: 32.7 g/dL (ref 30.0–36.0)
MCV: 95.2 fL (ref 78.0–100.0)
Platelets: 288 10*3/uL (ref 150–400)
RBC: 4.4 MIL/uL (ref 3.87–5.11)
RDW: 14.3 % (ref 11.5–15.5)
WBC: 7.7 10*3/uL (ref 4.0–10.5)

## 2017-03-10 LAB — BASIC METABOLIC PANEL
Anion gap: 10 (ref 5–15)
BUN: 19 mg/dL (ref 6–20)
CO2: 28 mmol/L (ref 22–32)
Calcium: 9.1 mg/dL (ref 8.9–10.3)
Chloride: 104 mmol/L (ref 101–111)
Creatinine, Ser: 1.13 mg/dL — ABNORMAL HIGH (ref 0.44–1.00)
GFR calc Af Amer: 53 mL/min — ABNORMAL LOW (ref >60)
GFR, EST NON AFRICAN AMERICAN: 45 mL/min — AB (ref >60)
GLUCOSE: 98 mg/dL (ref 65–99)
POTASSIUM: 3.9 mmol/L (ref 3.5–5.1)
Sodium: 142 mmol/L (ref 135–145)

## 2017-03-10 LAB — TROPONIN I
Troponin I: 0.05 ng/mL (ref <0.03)
Troponin I: 0.05 ng/mL (ref <0.03)

## 2017-03-10 MED ORDER — FUROSEMIDE 40 MG PO TABS
20.0000 mg | ORAL_TABLET | Freq: Once | ORAL | Status: AC
  Administered 2017-03-10: 20 mg via ORAL
  Filled 2017-03-10: qty 1

## 2017-03-10 MED ORDER — FUROSEMIDE 40 MG PO TABS: 40.0000 mg | ORAL_TABLET | Freq: Every day | ORAL | 1 refills | Status: DC

## 2017-03-10 NOTE — ED Provider Notes (Signed)
[facility]-EMERGENCY DEPT Provider Note   CSN: [id number] Arrival date & time: 03/10/17  1110     History   Chief Complaint Chief Complaint  Patient presents with  . Shortness of Breath    HPI Wanda Mckenzie is a 81 y.o. female.  Patient complains of dyspnea particularly at night lying down. No substernal chest pain. She is ambulatory without obvious respiratory distress. Blood pressure medications have been adjusted recently. Severity symptoms is mild. Mild peripheral edema.      Past Medical History:  Diagnosis Date  . Atrial fibrillation (HCC)   . COPD (chronic obstructive pulmonary disease) (HCC)   . Dyslipidemia   . Essential hypertension   . History of cardiac catheterization    Normal coronaries June 2015  . History of stroke    Right MCA distribution     Patient Active Problem List   Diagnosis Date Noted  . Memory change 06/05/2014  . Dementia 12/01/2013  . Bradycardia 12/01/2013  . Syncope and collapse 11/21/2013  . Varicose veins of lower extremities with other complications 02/02/2013  . Hypertension   . Chest pain, normal coronary arteries   . Dyslipidemia   . CVA (cerebral infarction)   . Aortic insufficiency   . Flushing   . Hypotension   . COPD (chronic obstructive pulmonary disease) (HCC)   . Chronic atrial fibrillation (HCC)   . Ejection fraction   . Carotid bruit   . Warfarin anticoagulation     Past Surgical History:  Procedure Laterality Date  . ABDOMINAL HYSTERECTOMY  1975  . FRACTURE SURGERY Left   . LEFT HEART CATHETERIZATION WITH CORONARY ANGIOGRAM N/A 11/23/2013   Procedure: LEFT HEART CATHETERIZATION WITH CORONARY ANGIOGRAM;  Surgeon: Kathleene Hazel, MD;  Location: [facility] CATH LAB;  Service: Cardiovascular;  Laterality: N/A;  . LEFT HIP HEMIARTHROPLASTY    . TRIBUTARY VARICOSITIES OF RIGHT LEG  05/23/2002  . VEIN LIGATION AND STRIPPING Right 02/08/2013   Procedure: Segmental excision of painful varicose veins right lower  extremity;  Surgeon: Chuck Hint, MD;  Location: [facility] OR;  Service: Vascular;  Laterality: Right;    OB History    No data available       Home Medications    Prior to Admission medications   Medication Sig Start Date End Date Taking? Authorizing Provider  alendronate (FOSAMAX) 70 MG tablet Take 70 mg by mouth every 7 (seven) days. Take with a full glass of water on an empty stomach. On sundays   Yes [provider]  atorvastatin (LIPITOR) 80 MG tablet Take 80 mg by mouth daily.     Yes [provider]  fenofibrate 160 MG tablet Take 160 mg by mouth daily.     Yes [provider]  Folic Acid-Vit B6-Vit B12 (FOLBEE) 2.5-25-1 MG TABS Take 1 tablet by mouth daily.     Yes [provider]  losartan (COZAAR) 100 MG tablet TAKE 1 TABLET ONCE DAILY. 09/04/15  Yes Jonelle Sidle, MD  Memantine HCl-Donepezil HCl [facility]) 28-10 MG CP24 Take 1 capsule by mouth at bedtime.   Yes [provider]  metoprolol tartrate (LOPRESSOR) 25 MG tablet TAKE (1/2) TABLET TWICE DAILY. 09/04/16  Yes Jonelle Sidle, MD  Multiple Vitamin (MULTIVITAMIN WITH MINERALS) TABS tablet Take 1 tablet by mouth daily. 11/24/13  Yes Leone Brand, NP  potassium chloride SA (K-DUR,KLOR-CON) 20 MEQ tablet Take 20 mEq by mouth 2 (two) times daily.   Yes [provider]  warfarin (COUMADIN) 5  MG tablet Take 2.5-5 mg by mouth daily. Takes 5 mg on Sunday, Tuesday and Thursday and 2.5 mg all other days   Yes [provider]  furosemide (LASIX) 40 MG tablet Take 1 tablet (40 mg total) by mouth daily. 03/10/17   Donnetta Hutching, MD    Family History Family History  Problem Relation Age of Onset  . Stroke Father   . Cancer Mother   . Hypertension Mother   . Cancer Sister   . Cancer Sister   . Cancer Sister     Social History Social History  Substance Use Topics  . Smoking status: Never Smoker  . Smokeless tobacco: Never Used  . Alcohol use No      Allergies   Codeine   Review of Systems Review of Systems  All other systems reviewed and are negative.    Physical Exam Updated Vital Signs BP (!) 195/116   Pulse 70   Temp 97.7 F (36.5 C) (Oral)   Resp (!) 26   Ht 6' (1.829 m)   Wt 90.7 kg (200 lb)   SpO2 93%   BMI 27.12 kg/m   Physical Exam  Constitutional: She is oriented to person, place, and time. She appears well-developed and well-nourished.  nad  HENT:  Head: Normocephalic and atraumatic.  Eyes: Conjunctivae are normal.  Neck: Neck supple.  Cardiovascular: Normal rate and regular rhythm.   Pulmonary/Chest: Effort normal and breath sounds normal.  No rales appreciated  Abdominal: Soft. Bowel sounds are normal.  Musculoskeletal: Normal range of motion.  Neurological: She is alert and oriented to person, place, and time.  Skin: Skin is warm and dry.  Min peripheral edema  Psychiatric: She has a normal mood and affect. Her behavior is normal.  Nursing note and vitals reviewed.    ED Treatments / Results  Labs (all labs ordered are listed, but only abnormal results are displayed) Labs Reviewed  BASIC METABOLIC PANEL - Abnormal; Notable for the following:       Result Value   Creatinine, Ser 1.13 (*)    GFR calc non Af Amer 45 (*)    GFR calc Af Amer 53 (*)    All other components within normal limits  TROPONIN I - Abnormal; Notable for the following:    Troponin I 0.05 (*)    All other components within normal limits  TROPONIN I - Abnormal; Notable for the following:    Troponin I 0.05 (*)    All other components within normal limits  CBC    EKG  EKG Interpretation  Date/Time:  Tuesday March 10 2017 11:18:20 EDT Ventricular Rate:  81 PR Interval:    QRS Duration: 108 QT Interval:  438 QTC Calculation: 508 R Axis:   105 Text Interpretation:  Atrial fibrillation Rightward axis Nonspecific ST and T wave abnormality Prolonged QT Abnormal ECG Confirmed by Donnetta Hutching ([id number]) on  03/10/2017 2:36:29 PM       Radiology Dg Chest 2 View  Result Date: 03/10/2017 CLINICAL DATA:  Shortness of breath.  Left arm pain. EXAM: CHEST  2 VIEW COMPARISON:  02/08/2013 FINDINGS: There is mild bilateral interstitial thickening. There is no focal parenchymal opacity. There is no pleural effusion or pneumothorax. There is stable cardiomegaly. The osseous structures are unremarkable. IMPRESSION: Cardiomegaly with mild pulmonary vascular congestion. Electronically Signed   By: Elige Ko   On: 03/10/2017 11:47    Procedures Procedures (including critical care time)  Medications Ordered in ED Medications  furosemide (LASIX)  tablet 20 mg (20 mg Oral Given 03/10/17 1725)     Initial Impression / Assessment and Plan / ED Course  I have reviewed the triage vital signs and the nursing notes.  Pertinent labs & imaging results that were available during my care of the patient were reviewed by me and considered in my medical decision making (see chart for details).     Patient is hemodynamically stable. Minimally elevated troponin likely secondary to hypertension.  Patient is in no acute distress. Will increase Lasix from 20 mg to 40 mg daily.  Final Clinical Impressions(s) / ED Diagnoses   Final diagnoses:  Hypertension, unspecified type    New Prescriptions Discharge Medication List as of 03/10/2017  5:26 PM       Donnetta Hutching, MD 03/10/17 2140

## 2017-03-10 NOTE — ED Notes (Signed)
Pt ambulated from waiting room to room 2 without difficultly, no distress noted

## 2017-03-10 NOTE — ED Triage Notes (Signed)
SOB over last several days, poor historian, per husband she can't remember, he states she was complaining of chest and arm pain today

## 2017-03-10 NOTE — ED Notes (Signed)
Pt has 1+ pitting edema to lower extremities, more on left than right.  States she cannot lay flat at night, denies exertional dyspnea.

## 2017-03-10 NOTE — Discharge Instructions (Signed)
Increase Lasix from 20 mg to 40 mg daily.You'll need to have your blood pressure rechecked soon

## 2017-03-10 NOTE — ED Notes (Signed)
Pt ambulated to bathroom with stand by assist.  

## 2017-03-10 NOTE — ED Notes (Signed)
CRITICAL VALUE ALERT  Critical Value:  Troponin 0.05  Date & Time Notied:  03/10/17  Provider Notified: Dr Adriana Simas

## 2017-03-17 ENCOUNTER — Inpatient Hospital Stay (HOSPITAL_COMMUNITY)
Admission: EM | Admit: 2017-03-17 | Discharge: 2017-03-19 | DRG: 293 | Disposition: A | Discharge status: Home Health | Payer: Medicare Other | Attending: Family Medicine | Admitting: Family Medicine

## 2017-03-17 ENCOUNTER — Other Ambulatory Visit: Payer: Self-pay

## 2017-03-17 ENCOUNTER — Emergency Department (HOSPITAL_COMMUNITY): Payer: Medicare Other

## 2017-03-17 ENCOUNTER — Encounter (HOSPITAL_COMMUNITY): Payer: Self-pay | Admitting: Emergency Medicine

## 2017-03-17 DIAGNOSIS — E785 Hyperlipidemia, unspecified: Secondary | ICD-10-CM | POA: Diagnosis present

## 2017-03-17 DIAGNOSIS — I509 Heart failure, unspecified: Secondary | ICD-10-CM

## 2017-03-17 DIAGNOSIS — Z7901 Long term (current) use of anticoagulants: Secondary | ICD-10-CM | POA: Diagnosis not present

## 2017-03-17 DIAGNOSIS — E876 Hypokalemia: Secondary | ICD-10-CM | POA: Diagnosis not present

## 2017-03-17 DIAGNOSIS — I482 Chronic atrial fibrillation, unspecified: Secondary | ICD-10-CM | POA: Diagnosis present

## 2017-03-17 DIAGNOSIS — I110 Hypertensive heart disease with heart failure: Secondary | ICD-10-CM | POA: Diagnosis present

## 2017-03-17 DIAGNOSIS — Z823 Family history of stroke: Secondary | ICD-10-CM | POA: Diagnosis not present

## 2017-03-17 DIAGNOSIS — F0390 Unspecified dementia without behavioral disturbance: Secondary | ICD-10-CM | POA: Diagnosis not present

## 2017-03-17 DIAGNOSIS — Z79899 Other long term (current) drug therapy: Secondary | ICD-10-CM | POA: Diagnosis not present

## 2017-03-17 DIAGNOSIS — I5043 Acute on chronic combined systolic (congestive) and diastolic (congestive) heart failure: Secondary | ICD-10-CM | POA: Diagnosis not present

## 2017-03-17 DIAGNOSIS — Z8673 Personal history of transient ischemic attack (TIA), and cerebral infarction without residual deficits: Secondary | ICD-10-CM

## 2017-03-17 DIAGNOSIS — J449 Chronic obstructive pulmonary disease, unspecified: Secondary | ICD-10-CM | POA: Diagnosis present

## 2017-03-17 DIAGNOSIS — Z8249 Family history of ischemic heart disease and other diseases of the circulatory system: Secondary | ICD-10-CM | POA: Diagnosis not present

## 2017-03-17 DIAGNOSIS — Z885 Allergy status to narcotic agent status: Secondary | ICD-10-CM | POA: Diagnosis not present

## 2017-03-17 DIAGNOSIS — I10 Essential (primary) hypertension: Secondary | ICD-10-CM | POA: Diagnosis present

## 2017-03-17 LAB — CBC
HCT: 40.7 % (ref 36.0–46.0)
Hemoglobin: 13.3 g/dL (ref 12.0–15.0)
MCH: 30.8 pg (ref 26.0–34.0)
MCHC: 32.7 g/dL (ref 30.0–36.0)
MCV: 94.2 fL (ref 78.0–100.0)
PLATELETS: 260 10*3/uL (ref 150–400)
RBC: 4.32 MIL/uL (ref 3.87–5.11)
RDW: 14.2 % (ref 11.5–15.5)
WBC: 8.1 10*3/uL (ref 4.0–10.5)

## 2017-03-17 LAB — BASIC METABOLIC PANEL
Anion gap: 8 (ref 5–15)
BUN: 14 mg/dL (ref 6–20)
CHLORIDE: 106 mmol/L (ref 101–111)
CO2: 27 mmol/L (ref 22–32)
CREATININE: 0.98 mg/dL (ref 0.44–1.00)
Calcium: 9.6 mg/dL (ref 8.9–10.3)
GFR calc non Af Amer: 54 mL/min — ABNORMAL LOW (ref >60)
Glucose, Bld: 113 mg/dL — ABNORMAL HIGH (ref 65–99)
Potassium: 3.6 mmol/L (ref 3.5–5.1)
Sodium: 141 mmol/L (ref 135–145)

## 2017-03-17 LAB — I-STAT TROPONIN, ED: Troponin i, poc: 0.04 ng/mL (ref 0.00–0.08)

## 2017-03-17 MED ORDER — FUROSEMIDE 10 MG/ML IJ SOLN
40.0000 mg | Freq: Once | INTRAMUSCULAR | Status: AC
  Administered 2017-03-17: 40 mg via INTRAVENOUS
  Filled 2017-03-17: qty 4

## 2017-03-17 MED ORDER — HYDRALAZINE HCL 20 MG/ML IJ SOLN
10.0000 mg | Freq: Once | INTRAMUSCULAR | Status: AC
  Administered 2017-03-18: 10 mg via INTRAVENOUS
  Filled 2017-03-17: qty 1

## 2017-03-17 NOTE — ED Triage Notes (Signed)
Pt c/o SOB for the last month. Pt lungs CTA. No respiratory distress noted. C/o unable to catch her breathe and chest pain. Per husband breathing is worse when she lies down. Pt looks at husband when I ask her questions. VSS. Denies recent fever, cough.

## 2017-03-17 NOTE — ED Notes (Signed)
Patient transported to X-ray 

## 2017-03-17 NOTE — ED Provider Notes (Signed)
[facility]-EMERGENCY DEPT Provider Note   CSN: [id number] Arrival date & time: 03/17/17  1238     History   Chief Complaint Chief Complaint  Patient presents with  . Shortness of Breath    HPI Wanda Mckenzie is a 81 y.o. female.  Patient is a 81 year old female with a significant past medical history for atrial fibrillation, COPD, hypertension, CVA with dementia presenting today with worsening shortness of breath. Husband states for the last 1-2 months she's had gradually worsening exertional dyspnea and orthopnea. They were seen about a week ago at [facility] emergency room for similar symptoms. At that time she was placed on 40 mg of Lasix which she has been taking daily but has been states symptoms are only worsening. She wakes up multiple times a night and has to sit up and sit on the edge of the bed to catch her breath. Then this morning she developed severe shortness of breath and some chest discomfort walking short distances in the house. Husband states she is now not able to walk more than 20 feet without having to sit down and rest. Denies any cough, fever, abdominal pain. She does have leg swelling but does not feel like it's any different than baseline. She does not check her weight regularly.   The history is provided by the patient and the spouse.    Past Medical History:  Diagnosis Date  . Atrial fibrillation (HCC)   . COPD (chronic obstructive pulmonary disease) (HCC)   . Dyslipidemia   . Essential hypertension   . History of cardiac catheterization    Normal coronaries June 2015  . History of stroke    Right MCA distribution     Patient Active Problem List   Diagnosis Date Noted  . Memory change 06/05/2014  . Dementia 12/01/2013  . Bradycardia 12/01/2013  . Syncope and collapse 11/21/2013  . Varicose veins of lower extremities with other complications 02/02/2013  . Hypertension   . Chest pain, normal coronary arteries   . Dyslipidemia   . CVA (cerebral  infarction)   . Aortic insufficiency   . Flushing   . Hypotension   . COPD (chronic obstructive pulmonary disease) (HCC)   . Chronic atrial fibrillation (HCC)   . Ejection fraction   . Carotid bruit   . Warfarin anticoagulation     Past Surgical History:  Procedure Laterality Date  . ABDOMINAL HYSTERECTOMY  1975  . FRACTURE SURGERY Left   . LEFT HEART CATHETERIZATION WITH CORONARY ANGIOGRAM N/A 11/23/2013   Procedure: LEFT HEART CATHETERIZATION WITH CORONARY ANGIOGRAM;  Surgeon: Kathleene Hazel, MD;  Location: [facility] CATH LAB;  Service: Cardiovascular;  Laterality: N/A;  . LEFT HIP HEMIARTHROPLASTY    . TRIBUTARY VARICOSITIES OF RIGHT LEG  05/23/2002  . VEIN LIGATION AND STRIPPING Right 02/08/2013   Procedure: Segmental excision of painful varicose veins right lower extremity;  Surgeon: Chuck Hint, MD;  Location: [facility] OR;  Service: Vascular;  Laterality: Right;    OB History    No data available       Home Medications    Prior to Admission medications   Medication Sig Start Date End Date Taking? Authorizing Provider  alendronate (FOSAMAX) 70 MG tablet Take 70 mg by mouth every 7 (seven) days. Take with a full glass of water on an empty stomach. On sundays   Yes [provider]  atorvastatin (LIPITOR) 80 MG tablet Take 80 mg by mouth daily.     Yes [provider]  fenofibrate 160 MG tablet Take 160 mg by mouth daily.     Yes [provider]  Folic Acid-Vit B6-Vit B12 (FOLBEE) 2.5-25-1 MG TABS Take 1 tablet by mouth daily.     Yes [provider]  furosemide (LASIX) 40 MG tablet Take 1 tablet (40 mg total) by mouth daily. 03/10/17  Yes Donnetta Hutching, MD  losartan (COZAAR) 100 MG tablet TAKE 1 TABLET ONCE DAILY. Patient taking differently: TAKE 100MG  BY MOUTH ONCE DAILY. 09/04/15  Yes Jonelle Sidle, MD  Memantine HCl-Donepezil HCl [facility]) 28-10 MG CP24 Take 1 capsule by mouth at bedtime.   Yes [provider]    metoprolol tartrate (LOPRESSOR) 25 MG tablet TAKE (1/2) TABLET TWICE DAILY. Patient taking differently: Take 12.5 mg by mouth 2 (two) times daily.  09/04/16  Yes Jonelle Sidle, MD  Multiple Vitamin (MULTIVITAMIN WITH MINERALS) TABS tablet Take 1 tablet by mouth daily. 11/24/13  Yes Leone Brand, NP  potassium chloride SA (K-DUR,KLOR-CON) 20 MEQ tablet Take 20 mEq by mouth 2 (two) times daily.   Yes [provider]  warfarin (COUMADIN) 5 MG tablet Take 2.5-5 mg by mouth daily. Takes 5 mg on Sunday, Tuesday and Thursday and 2.5 mg all other days   Yes [provider]    Family History Family History  Problem Relation Age of Onset  . Stroke Father   . Cancer Mother   . Hypertension Mother   . Cancer Sister   . Cancer Sister   . Cancer Sister     Social History Social History  Substance Use Topics  . Smoking status: Never Smoker  . Smokeless tobacco: Never Used  . Alcohol use No     Allergies   Codeine   Review of Systems Review of Systems  All other systems reviewed and are negative.    Physical Exam Updated Vital Signs BP (!) 192/93   Pulse 66   Temp 98 F (36.7 C) (Oral)   Resp 19   SpO2 96%   Physical Exam  Constitutional: She is oriented to person, place, and time. She appears well-developed and well-nourished. No distress.  HENT:  Head: Normocephalic and atraumatic.  Mouth/Throat: Oropharynx is clear and moist.  Eyes: Pupils are equal, round, and reactive to light. Conjunctivae and EOM are normal.  Neck: Normal range of motion. Neck supple.  Cardiovascular: Normal rate, regular rhythm and intact distal pulses.   No murmur heard. Pulmonary/Chest: Effort normal. No respiratory distress. She has no wheezes. She has rales in the right lower field and the left lower field.  Abdominal: Soft. She exhibits no distension. There is no tenderness. There is no rebound and no guarding.  Musculoskeletal: Normal range of motion. She exhibits edema.  She exhibits no tenderness.  1+ bilaterally in feet and ankles  Neurological: She is alert and oriented to person, place, and time.  Skin: Skin is warm and dry. Capillary refill takes less than 2 seconds. No rash noted. No erythema.  Psychiatric: She has a normal mood and affect. Her behavior is normal.  Nursing note and vitals reviewed.    ED Treatments / Results  Labs (all labs ordered are listed, but only abnormal results are displayed) Labs Reviewed  BASIC METABOLIC PANEL - Abnormal; Notable for the following:       Result Value   Glucose, Bld 113 (*)    GFR calc non Af Amer 54 (*)    All other components within normal limits  CBC  BRAIN  NATRIURETIC PEPTIDE  I-STAT TROPONIN, ED    EKG  EKG Interpretation  Date/Time:  Tuesday March 17 2017 12:43:44 EDT Ventricular Rate:  87 PR Interval:    QRS Duration: 108 QT Interval:  428 QTC Calculation: 515 R Axis:   91 Text Interpretation:  Atrial fibrillation Rightward axis Anterior infarct , age undetermined Prolonged QT No significant change since last tracing Confirmed by Gwyneth Sprout ([id number]) on 03/17/2017 8:32:04 PM       Radiology Dg Chest 2 View  Result Date: 03/17/2017 CLINICAL DATA:  Shortness of breath EXAM: CHEST  2 VIEW COMPARISON:  03/10/2017, 02/08/2013 FINDINGS: Mild cardiomegaly with central vascular congestion. Streaky bibasilar atelectasis or infiltrates and tiny effusions. Aortic atherosclerosis. No pneumothorax. IMPRESSION: Cardiomegaly with central vascular congestion and tiny pleural effusions. Streaky bibasilar atelectasis or infiltrates. Electronically Signed   By: Jasmine Pang M.D.   On: 03/17/2017 21:36    Procedures Procedures (including critical care time)  Medications Ordered in ED Medications  furosemide (LASIX) injection 40 mg (40 mg Intravenous Given 03/17/17 2215)     Initial Impression / Assessment and Plan / ED Course  I have reviewed the triage vital signs and the nursing  notes.  Pertinent labs & imaging results that were available during my care of the patient were reviewed by me and considered in my medical decision making (see chart for details).     Elderly female presenting today with shortness of breath, exertional dyspnea, orthopnea. Patient's symptoms are concerning for worsening CHF. She has been taking Lasix at home and additional increased dose without improvement in symptoms. Patient is failing outpatient management. Husband states she's taking all her medicines as prescribed but her breathing is getting worse. She does not use inhalers at home and has not had any wheezing. No infectious symptoms noted. Patient denies any ongoing chest pain it's only when she become short of breath with walking. No prior history of blood clots and patient is anticoagulated.  Low suspicion A for PE, dissection, abdominal etiology. X-ray shows evidence of fluid overload. Troponin is within normal limits. EKG without acute findings. Patient's renal function within normal limits. She was given IV Lasix until she will need admission for diuresis given failure of outpatient treatment.  Final Clinical Impressions(s) / ED Diagnoses   Final diagnoses:  Acute on chronic congestive heart failure, unspecified heart failure type [facility])    New Prescriptions New Prescriptions   No medications on file     Gwyneth Sprout, MD 03/17/17 2342

## 2017-03-18 ENCOUNTER — Encounter (HOSPITAL_COMMUNITY): Payer: Self-pay | Admitting: Family Medicine

## 2017-03-18 ENCOUNTER — Inpatient Hospital Stay (HOSPITAL_COMMUNITY): Payer: Medicare Other

## 2017-03-18 DIAGNOSIS — I509 Heart failure, unspecified: Secondary | ICD-10-CM

## 2017-03-18 DIAGNOSIS — I5043 Acute on chronic combined systolic (congestive) and diastolic (congestive) heart failure: Secondary | ICD-10-CM | POA: Diagnosis not present

## 2017-03-18 DIAGNOSIS — I10 Essential (primary) hypertension: Secondary | ICD-10-CM

## 2017-03-18 DIAGNOSIS — Z8673 Personal history of transient ischemic attack (TIA), and cerebral infarction without residual deficits: Secondary | ICD-10-CM | POA: Diagnosis not present

## 2017-03-18 DIAGNOSIS — I482 Chronic atrial fibrillation: Secondary | ICD-10-CM

## 2017-03-18 DIAGNOSIS — F0390 Unspecified dementia without behavioral disturbance: Secondary | ICD-10-CM

## 2017-03-18 DIAGNOSIS — J449 Chronic obstructive pulmonary disease, unspecified: Secondary | ICD-10-CM | POA: Diagnosis not present

## 2017-03-18 DIAGNOSIS — Z7901 Long term (current) use of anticoagulants: Secondary | ICD-10-CM | POA: Diagnosis not present

## 2017-03-18 DIAGNOSIS — Z885 Allergy status to narcotic agent status: Secondary | ICD-10-CM | POA: Diagnosis not present

## 2017-03-18 DIAGNOSIS — E785 Hyperlipidemia, unspecified: Secondary | ICD-10-CM | POA: Diagnosis not present

## 2017-03-18 DIAGNOSIS — Z823 Family history of stroke: Secondary | ICD-10-CM | POA: Diagnosis not present

## 2017-03-18 DIAGNOSIS — Z8249 Family history of ischemic heart disease and other diseases of the circulatory system: Secondary | ICD-10-CM | POA: Diagnosis not present

## 2017-03-18 DIAGNOSIS — Z79899 Other long term (current) drug therapy: Secondary | ICD-10-CM | POA: Diagnosis not present

## 2017-03-18 DIAGNOSIS — E876 Hypokalemia: Secondary | ICD-10-CM | POA: Diagnosis not present

## 2017-03-18 DIAGNOSIS — I110 Hypertensive heart disease with heart failure: Secondary | ICD-10-CM | POA: Diagnosis present

## 2017-03-18 LAB — ECHOCARDIOGRAM COMPLETE
Height: 72 in
WEIGHTICAEL: 3114.66 [oz_av]

## 2017-03-18 LAB — BRAIN NATRIURETIC PEPTIDE: B NATRIURETIC PEPTIDE 5: 484.4 pg/mL — AB (ref 0.0–100.0)

## 2017-03-18 LAB — MAGNESIUM: MAGNESIUM: 1.8 mg/dL (ref 1.7–2.4)

## 2017-03-18 LAB — PROTIME-INR
INR: 4.09
PROTHROMBIN TIME: 39.2 s — AB (ref 11.4–15.2)

## 2017-03-18 MED ORDER — FENOFIBRATE 160 MG PO TABS
160.0000 mg | ORAL_TABLET | Freq: Every day | ORAL | Status: DC
  Administered 2017-03-18 – 2017-03-19 (×2): 160 mg via ORAL
  Filled 2017-03-18 (×2): qty 1

## 2017-03-18 MED ORDER — SODIUM CHLORIDE 0.9% FLUSH
3.0000 mL | Freq: Two times a day (BID) | INTRAVENOUS | Status: DC
  Administered 2017-03-18 – 2017-03-19 (×3): 3 mL via INTRAVENOUS

## 2017-03-18 MED ORDER — METOPROLOL TARTRATE 12.5 MG HALF TABLET
12.5000 mg | ORAL_TABLET | Freq: Two times a day (BID) | ORAL | Status: DC
  Administered 2017-03-18 – 2017-03-19 (×3): 12.5 mg via ORAL
  Filled 2017-03-18 (×3): qty 1

## 2017-03-18 MED ORDER — DONEPEZIL HCL 10 MG PO TABS
10.0000 mg | ORAL_TABLET | Freq: Every day | ORAL | Status: DC
  Administered 2017-03-18: 10 mg via ORAL
  Filled 2017-03-18: qty 1

## 2017-03-18 MED ORDER — ENOXAPARIN SODIUM 40 MG/0.4ML ~~LOC~~ SOLN: 40.0000 mg | SUBCUTANEOUS | Status: DC

## 2017-03-18 MED ORDER — ADULT MULTIVITAMIN W/MINERALS CH
1.0000 | ORAL_TABLET | Freq: Every day | ORAL | Status: DC
  Administered 2017-03-18 – 2017-03-19 (×2): 1 via ORAL
  Filled 2017-03-18 (×2): qty 1

## 2017-03-18 MED ORDER — ATORVASTATIN CALCIUM 80 MG PO TABS
80.0000 mg | ORAL_TABLET | Freq: Every day | ORAL | Status: DC
  Administered 2017-03-18: 80 mg via ORAL
  Filled 2017-03-18: qty 1

## 2017-03-18 MED ORDER — ALPRAZOLAM 0.25 MG PO TABS
0.2500 mg | ORAL_TABLET | Freq: Two times a day (BID) | ORAL | Status: DC | PRN
  Administered 2017-03-18: 0.25 mg via ORAL
  Filled 2017-03-18: qty 1

## 2017-03-18 MED ORDER — SODIUM CHLORIDE 0.9 % IV SOLN
250.0000 mL | INTRAVENOUS | Status: DC | PRN
Start: 2017-03-18 — End: 2017-03-19

## 2017-03-18 MED ORDER — FOLIC ACID-VIT B6-VIT B12 2.5-25-1 MG PO TABS
1.0000 | ORAL_TABLET | Freq: Every day | ORAL | Status: DC
  Administered 2017-03-19: 1 via ORAL
  Filled 2017-03-18 (×3): qty 1

## 2017-03-18 MED ORDER — FUROSEMIDE 10 MG/ML IJ SOLN
40.0000 mg | Freq: Two times a day (BID) | INTRAMUSCULAR | Status: DC
  Administered 2017-03-18 (×2): 40 mg via INTRAVENOUS
  Filled 2017-03-18 (×4): qty 4

## 2017-03-18 MED ORDER — MEMANTINE HCL ER 28 MG PO CP24
28.0000 mg | ORAL_CAPSULE | Freq: Every day | ORAL | Status: DC
  Administered 2017-03-18: 28 mg via ORAL
  Filled 2017-03-18: qty 1

## 2017-03-18 MED ORDER — LOSARTAN POTASSIUM 50 MG PO TABS
100.0000 mg | ORAL_TABLET | Freq: Every day | ORAL | Status: DC
  Administered 2017-03-18 – 2017-03-19 (×2): 100 mg via ORAL
  Filled 2017-03-18 (×2): qty 2

## 2017-03-18 MED ORDER — ACETAMINOPHEN 325 MG PO TABS: 650.0000 mg | ORAL_TABLET | ORAL | Status: DC | PRN

## 2017-03-18 MED ORDER — HYDRALAZINE HCL 20 MG/ML IJ SOLN
10.0000 mg | INTRAMUSCULAR | Status: DC | PRN
  Administered 2017-03-18: 10 mg via INTRAVENOUS
  Filled 2017-03-18: qty 1

## 2017-03-18 MED ORDER — ONDANSETRON HCL 4 MG/2ML IJ SOLN: 4.0000 mg | Freq: Four times a day (QID) | INTRAMUSCULAR | Status: DC | PRN

## 2017-03-18 MED ORDER — SODIUM CHLORIDE 0.9% FLUSH: 3.0000 mL | INTRAVENOUS | Status: DC | PRN

## 2017-03-18 MED ORDER — POTASSIUM CHLORIDE CRYS ER 20 MEQ PO TBCR
20.0000 meq | EXTENDED_RELEASE_TABLET | Freq: Two times a day (BID) | ORAL | Status: DC
  Administered 2017-03-18 – 2017-03-19 (×3): 20 meq via ORAL
  Filled 2017-03-18 (×3): qty 1

## 2017-03-18 MED ORDER — WARFARIN - PHARMACIST DOSING INPATIENT: Freq: Every day | Status: DC

## 2017-03-18 MED ORDER — MEMANTINE HCL-DONEPEZIL HCL ER 28-10 MG PO CP24: 1.0000 | ORAL_CAPSULE | Freq: Every day | ORAL | Status: DC

## 2017-03-18 NOTE — Progress Notes (Signed)
ANTICOAGULATION CONSULT NOTE   Pharmacy Consult for Warfarin  Indication: atrial fibrillation  Allergies  Allergen Reactions  . Codeine     REACTION: vomiting   Vital Signs: Temp: 97.4 F (36.3 C) (09/26 0737) Temp Source: Oral (09/26 0737) BP: 188/75 (09/26 0800) Pulse Rate: 95 (09/26 0800)  Labs:  Recent Labs  03/17/17 1318 03/18/17 0323  HGB 13.3  --   HCT 40.7  --   PLT 260  --   LABPROT  --  39.2*  INR  --  4.09*  CREATININE 0.98  --     Estimated Creatinine Clearance: 59.8 mL/min (by C-G formula based on SCr of 0.98 mg/dL).  Assessment: 81 y/o F here with shortness of breath continues on warfarin for history of afib. INR is supratherapeutic at 4.09. No bleeding noted.   Goal of Therapy:  INR 2-3 Monitor platelets by anticoagulation protocol: Yes   Plan:  No warfarin today Daily INR  Kyjuan Gause, Drake Leach 03/18/2017,8:36 AM

## 2017-03-18 NOTE — Progress Notes (Signed)
  Echocardiogram 2D Echocardiogram has been performed.  Wanda Mckenzie 03/18/2017, 4:11 PM

## 2017-03-18 NOTE — Progress Notes (Signed)
SATURATION QUALIFICATIONS: (This note is used to comply with regulatory documentation for home oxygen)  Patient Saturations on Room Air at Rest = 99%  Patient Saturations on Room Air while Ambulating =97%    Please briefly explain why patient needs home oxygen: 

## 2017-03-18 NOTE — Progress Notes (Addendum)
Attending Note   I agree with the History/assesment & plan per  MD as scribed elsewhere within the note and have independently examined as well as discussed the Plan of care with the patient, and ammendments were made to above note reflecting my thoughts after careful review of Database, Progress notes, Imaging and Consultant notes   Further Prior r frontal MCA CVA 12/03/15, L occipital infract 2011 NORMAL CORONARIES cath 2015, echo 40-45% 11/24/13, known chr bradycardias hld--see Dr. Diona Browner   Apparently came to ED at [facility] 9/18 last week, given some lasix po. Been sob for 1-2 weeks Husband manages meds, unclear how much salt they eat--she is dependant on him for care Ambulating to RR became SOB and needed Supplemental oxygen in ED  E No jvd, no bruit cta b Confused slighlty-but pleasant No cp   P  Diurese-last wght  In ED 200 lbs--prior to that was 209-cannot use weight as accurate indication of volume D/w husband at bedside   Pleas Koch, MD [facility] ([phone]

## 2017-03-18 NOTE — Progress Notes (Signed)
ANTICOAGULATION CONSULT NOTE - Initial Consult  Pharmacy Consult for Warfarin  Indication: atrial fibrillation  Allergies  Allergen Reactions  . Codeine     REACTION: vomiting   Vital Signs: Temp: 98 F (36.7 C) (09/25 1607) Temp Source: Oral (09/25 1607) BP: 186/76 (09/26 0014) Pulse Rate: 98 (09/26 0000)  Labs:  Recent Labs  03/17/17 1318  HGB 13.3  HCT 40.7  PLT 260  CREATININE 0.98    Estimated Creatinine Clearance: 59.8 mL/min (by C-G formula based on SCr of 0.98 mg/dL).   Medical History: Past Medical History:  Diagnosis Date  . Atrial fibrillation (HCC)   . COPD (chronic obstructive pulmonary disease) (HCC)   . Dyslipidemia   . Essential hypertension   . History of cardiac catheterization    Normal coronaries June 2015  . History of stroke    Right MCA distribution     Assessment: 81 y/o F here with shortness of breath, continuing PTA warfarin for afib, no INR drawn yet  Goal of Therapy:  INR 2-3 Monitor platelets by anticoagulation protocol: Yes   Plan:  -INR with AM labs to assess dosing needs  Abran Duke 03/18/2017,1:03 AM

## 2017-03-18 NOTE — H&P (Signed)
History and Physical    Wanda Mckenzie:[id number] DOB: [birth year] DOA: 03/17/2017  PCP: Joette Catching, MD   Patient coming from: Home  Chief Complaint: SOB, orthopnea  HPI: Wanda Mckenzie is a 81 y.o. female with medical history significant for hypertension, dementia, chronic combined CHF, and chronic atrial fibrillation on warfarin, now presenting to the emergency department with progressive dyspnea and orthopnea. Patient is accompanied by her husband who assists with the history. She had reportedly been experiencing shortness of breath and orthopnea that has been progressively worsening over the past one month or more. She was evaluated for these complaints at a different emergency department one week ago, and discharged home with instructions to double her Lasix. Despite this, she has continued to worsen and is now symptomatic while at rest. Denies any significant cough, denies chest pain or palpitations, and denies fevers or chills. She reports that her chronic lower extremity edema appears to be unchanged. Orthopnea has continued to worsen.  ED Course: Upon arrival to the ED, patient is found to be afebrile, saturating 88% on room air, hypertensive, and otherwise stable. EKG features atrial fibrillation with QTc interval of 515 ms. Chest x-ray demonstrates cardiomegaly with central vascular congestion and a knee pleural effusions. Chemistry panel is unremarkable and CBC is entirely within normal limits. Troponin is within normal limits and BNP is elevated to 484. Patient was treated with 40 mg IV Lasix and 10 mg IV hydralazine in the ED. She remained hemodynamically stable, but continues to be dyspneic while at rest in her bed. She will be admitted to the telemetry unit for ongoing evaluation and management of acute on chronic combined CHF.  Review of Systems:  All other systems reviewed and apart from HPI, are negative.  Past Medical History:  Diagnosis Date  . Atrial fibrillation  (HCC)   . COPD (chronic obstructive pulmonary disease) (HCC)   . Dyslipidemia   . Essential hypertension   . History of cardiac catheterization    Normal coronaries June 2015  . History of stroke    Right MCA distribution     Past Surgical History:  Procedure Laterality Date  . ABDOMINAL HYSTERECTOMY  1975  . FRACTURE SURGERY Left   . LEFT HEART CATHETERIZATION WITH CORONARY ANGIOGRAM N/A 11/23/2013   Procedure: LEFT HEART CATHETERIZATION WITH CORONARY ANGIOGRAM;  Surgeon: Kathleene Hazel, MD;  Location: [facility] CATH LAB;  Service: Cardiovascular;  Laterality: N/A;  . LEFT HIP HEMIARTHROPLASTY    . TRIBUTARY VARICOSITIES OF RIGHT LEG  05/23/2002  . VEIN LIGATION AND STRIPPING Right 02/08/2013   Procedure: Segmental excision of painful varicose veins right lower extremity;  Surgeon: Chuck Hint, MD;  Location: [facility] OR;  Service: Vascular;  Laterality: Right;     reports that she has never smoked. She has never used smokeless tobacco. She reports that she does not drink alcohol or use drugs.  Allergies  Allergen Reactions  . Codeine     REACTION: vomiting    Family History  Problem Relation Age of Onset  . Stroke Father   . Cancer Mother   . Hypertension Mother   . Cancer Sister   . Cancer Sister   . Cancer Sister      Prior to Admission medications   Medication Sig Start Date End Date Taking? Authorizing Provider  alendronate (FOSAMAX) 70 MG tablet Take 70 mg by mouth every 7 (seven) days. Take with a full glass of water on an empty stomach. On sundays   Yes  [provider]  atorvastatin (LIPITOR) 80 MG tablet Take 80 mg by mouth daily.     Yes [provider]  fenofibrate 160 MG tablet Take 160 mg by mouth daily.     Yes [provider]  Folic Acid-Vit B6-Vit B12 (FOLBEE) 2.5-25-1 MG TABS Take 1 tablet by mouth daily.     Yes [provider]  furosemide (LASIX) 40 MG tablet Take 1 tablet (40 mg total) by mouth daily. 03/10/17   Yes Donnetta Hutching, MD  losartan (COZAAR) 100 MG tablet TAKE 1 TABLET ONCE DAILY. Patient taking differently: TAKE 100MG  BY MOUTH ONCE DAILY. 09/04/15  Yes Jonelle Sidle, MD  Memantine HCl-Donepezil HCl [facility]) 28-10 MG CP24 Take 1 capsule by mouth at bedtime.   Yes [provider]  metoprolol tartrate (LOPRESSOR) 25 MG tablet TAKE (1/2) TABLET TWICE DAILY. Patient taking differently: Take 12.5 mg by mouth 2 (two) times daily.  09/04/16  Yes Jonelle Sidle, MD  Multiple Vitamin (MULTIVITAMIN WITH MINERALS) TABS tablet Take 1 tablet by mouth daily. 11/24/13  Yes Leone Brand, NP  potassium chloride SA (K-DUR,KLOR-CON) 20 MEQ tablet Take 20 mEq by mouth 2 (two) times daily.   Yes [provider]  warfarin (COUMADIN) 5 MG tablet Take 2.5-5 mg by mouth daily. Takes 5 mg on Sunday, Tuesday and Thursday and 2.5 mg all other days   Yes [provider]    Physical Exam: Vitals:   03/17/17 2130 03/17/17 2200 03/18/17 0000 03/18/17 0014  BP: (!) 197/116 (!) 174/92 (!) 186/76 (!) 186/76  Pulse: 71 64 98   Resp: (!) 24 (!) 25 (!) 21   Temp:      TempSrc:      SpO2: 94% 93% (!) 88%       Constitutional: Calm, no pallor, no diaphoresis Eyes: PERTLA, lids and conjunctivae normal ENMT: Mucous membranes are moist. Posterior pharynx clear of any exudate or lesions.   Neck: normal, supple, no masses, no thyromegaly Respiratory: Labored respirations, speaking ~3 words between gasps. Fine rales bilaterally. No pallor, no cyanosis.   Cardiovascular: S1 & S2 heard, regular rate and rhythm. 1+ pretibial edema. 2+ pedal pulses. JVP 9 mmH2O. Abdomen: No distension, no tenderness, no masses palpated. Bowel sounds active.  Musculoskeletal: no clubbing / cyanosis. No joint deformity upper and lower extremities.   Skin: no significant rashes, lesions, ulcers. Warm, dry, well-perfused. Neurologic: CN 2-12 grossly intact. Sensation intact. Strength 5/5 in all 4 limbs.    Psychiatric: Alert and oriented to person, place, and situation. Very pleasant, cooperative.    Labs on Admission: I have personally reviewed following labs and imaging studies  CBC:  Recent Labs Lab 03/17/17 1318  WBC 8.1  HGB 13.3  HCT 40.7  MCV 94.2  PLT 260   Basic Metabolic Panel:  Recent Labs Lab 03/17/17 1318  NA 141  K 3.6  CL 106  CO2 27  GLUCOSE 113*  BUN 14  CREATININE 0.98  CALCIUM 9.6   GFR: Estimated Creatinine Clearance: 59.8 mL/min (by C-G formula based on SCr of 0.98 mg/dL). Liver Function Tests: No results for input(s): AST, ALT, ALKPHOS, BILITOT, PROT, ALBUMIN in the last 168 hours. No results for input(s): LIPASE, AMYLASE in the last 168 hours. No results for input(s): AMMONIA in the last 168 hours. Coagulation Profile: No results for input(s): INR, PROTIME in the last 168 hours. Cardiac Enzymes: No results for input(s): CKTOTAL, CKMB, CKMBINDEX, TROPONINI in the last 168 hours. BNP (last 3  results) No results for input(s): PROBNP in the last 8760 hours. HbA1C: No results for input(s): HGBA1C in the last 72 hours. CBG: No results for input(s): GLUCAP in the last 168 hours. Lipid Profile: No results for input(s): CHOL, HDL, LDLCALC, TRIG, CHOLHDL, LDLDIRECT in the last 72 hours. Thyroid Function Tests: No results for input(s): TSH, T4TOTAL, FREET4, T3FREE, THYROIDAB in the last 72 hours. Anemia Panel: No results for input(s): VITAMINB12, FOLATE, FERRITIN, TIBC, IRON, RETICCTPCT in the last 72 hours. Urine analysis:    Component Value Date/Time   COLORURINE AMBER (A) 02/08/2013 0911   APPEARANCEUR CLOUDY (A) 02/08/2013 0911   LABSPEC 1.022 02/08/2013 0911   PHURINE 6.0 02/08/2013 0911   GLUCOSEU NEGATIVE 02/08/2013 0911   HGBUR NEGATIVE 02/08/2013 0911   BILIRUBINUR SMALL (A) 02/08/2013 0911   KETONESUR NEGATIVE 02/08/2013 0911   PROTEINUR 100 (A) 02/08/2013 0911   UROBILINOGEN 1.0 02/08/2013 0911   NITRITE NEGATIVE 02/08/2013 0911    LEUKOCYTESUR MODERATE (A) 02/08/2013 0911   Sepsis Labs: (procalcitonin:4,lacticidven:4) )No results found for this or any previous visit (from the past 240 hour(s)).   Radiological Exams on Admission: Dg Chest 2 View  Result Date: 03/17/2017 CLINICAL DATA:  Shortness of breath EXAM: CHEST  2 VIEW COMPARISON:  03/10/2017, 02/08/2013 FINDINGS: Mild cardiomegaly with central vascular congestion. Streaky bibasilar atelectasis or infiltrates and tiny effusions. Aortic atherosclerosis. No pneumothorax. IMPRESSION: Cardiomegaly with central vascular congestion and tiny pleural effusions. Streaky bibasilar atelectasis or infiltrates. Electronically Signed   By: Jasmine Pang M.D.   On: 03/17/2017 21:36    EKG: Independently reviewed. Atrial fibrillation, QTc 515 ms.   Assessment/Plan  1. Acute on chronic combined CHF  - Pt presents with progressive dyspnea and orthopnea despite doubling her Lasix since 03/10/17  - She is found to be saturating 88% on rm air, dyspneic at rest, with JVD and rales, elevated BNP  - She was treated with Lasix 40 mg IV in ED and has begun diuresing  - Plan to continue cardiac monitoring, continue diuresis with Lasix 40 mg IV q12h, follow daily wts and I/O's, SLIV and fluid-restrict diet, follow daily chem panel during diuresis, and update echo  - Continue ARB and beta-blocker as tolerated    2. Chronic atrial fibrillation  - Pt is in rate-controlled a fib on admission - CHADS-VASc is 1 (age x2, gender, CVA x2, CHF, HTN)  - Check INR and continue warfarin, continue Lopressor  3. Hypertension  - Pt is hypertensive in ED and was treated with hydralazine IVP  - Continue Lopressor and losartan as tolerated, use hydralazine IVP's prn   4. Dementia  - Continue memantine and donepezil     DVT prophylaxis: sq Lovenox  Code Status: Full  Family Communication: Husband updated at bedside Disposition Plan: Admit to telemetry Consults called: None Admission  status: Inpatient    Briscoe Deutscher, MD [facility] Pager [phone]  If 7PM-7AM, please contact night-coverage [email or web address] Password [facility]  03/18/2017, 12:58 AM

## 2017-03-18 NOTE — ED Notes (Signed)
Attempted Report x1.   

## 2017-03-18 NOTE — ED Notes (Signed)
Pt ambulated to the bathroom and helped back in room by another nurse. Pt had no trouble ambulating.

## 2017-03-18 NOTE — ED Notes (Signed)
Informed MD Sofia of elevated INR.

## 2017-03-19 DIAGNOSIS — I5043 Acute on chronic combined systolic (congestive) and diastolic (congestive) heart failure: Secondary | ICD-10-CM | POA: Diagnosis not present

## 2017-03-19 DIAGNOSIS — I110 Hypertensive heart disease with heart failure: Secondary | ICD-10-CM | POA: Diagnosis not present

## 2017-03-19 LAB — COMPREHENSIVE METABOLIC PANEL
ALT: 31 U/L (ref 14–54)
AST: 35 U/L (ref 15–41)
Albumin: 3.7 g/dL (ref 3.5–5.0)
Alkaline Phosphatase: 72 U/L (ref 38–126)
Anion gap: 10 (ref 5–15)
BUN: 14 mg/dL (ref 6–20)
CHLORIDE: 103 mmol/L (ref 101–111)
CO2: 29 mmol/L (ref 22–32)
Calcium: 9.4 mg/dL (ref 8.9–10.3)
Creatinine, Ser: 0.98 mg/dL (ref 0.44–1.00)
GFR, EST NON AFRICAN AMERICAN: 54 mL/min — AB (ref >60)
Glucose, Bld: 133 mg/dL — ABNORMAL HIGH (ref 65–99)
POTASSIUM: 3.2 mmol/L — AB (ref 3.5–5.1)
SODIUM: 142 mmol/L (ref 135–145)
Total Bilirubin: 2 mg/dL — ABNORMAL HIGH (ref 0.3–1.2)
Total Protein: 6.1 g/dL — ABNORMAL LOW (ref 6.5–8.1)

## 2017-03-19 LAB — CBC
HCT: 41.4 % (ref 36.0–46.0)
HEMOGLOBIN: 13.9 g/dL (ref 12.0–15.0)
MCH: 31.7 pg (ref 26.0–34.0)
MCHC: 33.6 g/dL (ref 30.0–36.0)
MCV: 94.5 fL (ref 78.0–100.0)
Platelets: 264 10*3/uL (ref 150–400)
RBC: 4.38 MIL/uL (ref 3.87–5.11)
RDW: 14.7 % (ref 11.5–15.5)
WBC: 8.5 10*3/uL (ref 4.0–10.5)

## 2017-03-19 LAB — PROTIME-INR
INR: 3.56
Prothrombin Time: 35.3 seconds — ABNORMAL HIGH (ref 11.4–15.2)

## 2017-03-19 MED ORDER — POTASSIUM CHLORIDE CRYS ER 20 MEQ PO TBCR
40.0000 meq | EXTENDED_RELEASE_TABLET | Freq: Two times a day (BID) | ORAL | 0 refills | Status: DC
Start: 2017-03-19 — End: 2019-01-10

## 2017-03-19 MED ORDER — FUROSEMIDE 40 MG PO TABS: 40.0000 mg | ORAL_TABLET | Freq: Two times a day (BID) | ORAL | Status: DC

## 2017-03-19 MED ORDER — FUROSEMIDE 40 MG PO TABS: 40.0000 mg | ORAL_TABLET | Freq: Two times a day (BID) | ORAL | 0 refills | Status: DC

## 2017-03-19 MED ORDER — FUROSEMIDE 40 MG PO TABS
40.0000 mg | ORAL_TABLET | Freq: Two times a day (BID) | ORAL | Status: DC
  Administered 2017-03-19: 40 mg via ORAL
  Filled 2017-03-19: qty 1

## 2017-03-19 NOTE — Progress Notes (Signed)
Patient discharged home prior to [facility] choices offered/ arranged; CM to contact patient at home to offer choice; Alexis Goodell [phone]

## 2017-03-19 NOTE — Care Management Note (Signed)
Case Management Note  Patient Details  Name: Wanda Mckenzie MRN: [id number] Date of Birth: 1935/07/30  Subjective/Objective:    CHF            Action/Plan: Patient lives at home with spouse; PCP: Joette Catching, MD; has private insurance with [facility] with prescription drug coverage; CM will continue to follow for DCP  Expected Discharge Date:  03/20/17               Expected Discharge Plan: possibly Home w Home Health Services; Physical Therapy eval ordered for DCP  Discharge planning Services  CM Consult  Status of Service:  In process, will continue to follow  Reola Mosher [phone] 03/19/2017, 11:59 AM

## 2017-03-19 NOTE — Discharge Summary (Signed)
Physician Discharge Summary  Wanda Mckenzie ZOX:[id number] DOB: Dec 06, 1935 DOA: 03/17/2017  PCP: Joette Catching, MD  Admit date: 03/17/2017 Discharge date: 03/19/2017  Time spent: 25 minutes  Recommendations for Outpatient Follow-up:  1. Increase Lasix to 40 mg twice a day 2. Increase potassium this admissionto 40 mg twice a day 3. Have discontinued patient's bisphosphonate as lack of mortality benefit in her case  Discharge Diagnoses:  Principal Problem:   Acute on chronic combined systolic and diastolic CHF (congestive heart failure) (HCC) Active Problems:   History of CVA (cerebrovascular accident)   Chronic atrial fibrillation [facility])   Essential hypertension   Dementia   Discharge Condition: improved  Diet recommendation: heart healthy  Filed Weights   03/18/17 1155 03/19/17 0652  Weight: 88.3 kg (194 lb 10.7 oz) 86.9 kg (191 lb 8 oz)    History of present illness:  81 year old female with prior right frontal MCA CVA 12/03/2015, prior left occipital infarct in 2011, normal coronary arteries on cardiac cath 2015 chronic known bradycardias and hyperlipidemia Chronic atrial fibrillation on warfarin admitted with progressive dyspnea and orthopnea She was seen recently in the emergency room and discharged home from another emergency department about 2 weeks prior and came back to the hospital with dyspnea on rest  She was satting 88% on room air and was hypertensive she had some mild congestion based on chest x-ray and effusions  Labs were otherwise unremarkable she was given IV Lasix and IV hydralazine the emergency room She was still hypoxic and was admitted and subsequently was transitioned to by mouth Lasix 40 twice a day and her potassium was increased because of the hypokalemia on discharge 3.2  She did not require oxygen on discharge after ambulation  Discharge Exam: Vitals:   03/19/17 0652 03/19/17 1226  BP: 137/76 (!) 134/55  Pulse: 70 74  Resp:    Temp: 98.9  F (37.2 C)   SpO2: 100% 94%    General: alert wasn't oriented but is very pleasant Cardiovascular: S1-S2 irregular Respiratory: clinically clear no added sound Abdomen soft nontender no rebound or guarding   Discharge Instructions    Current Discharge Medication List    CONTINUE these medications which have CHANGED   Details  furosemide (LASIX) 40 MG tablet Take 1 tablet (40 mg total) by mouth 2 (two) times daily. Qty: 30 tablet, Refills: 0    potassium chloride SA (K-DUR,KLOR-CON) 20 MEQ tablet Take 2 tablets (40 mEq total) by mouth 2 (two) times daily. Qty: 45 tablet, Refills: 0      CONTINUE these medications which have NOT CHANGED   Details  atorvastatin (LIPITOR) 80 MG tablet Take 80 mg by mouth daily.      fenofibrate 160 MG tablet Take 160 mg by mouth daily.      Folic Acid-Vit B6-Vit B12 (FOLBEE) 2.5-25-1 MG TABS Take 1 tablet by mouth daily.      losartan (COZAAR) 100 MG tablet TAKE 1 TABLET ONCE DAILY. Qty: 30 tablet, Refills: 6    Memantine HCl-Donepezil HCl (NAMZARIC) 28-10 MG CP24 Take 1 capsule by mouth at bedtime.    metoprolol tartrate (LOPRESSOR) 25 MG tablet TAKE (1/2) TABLET TWICE DAILY. Qty: 30 tablet, Refills: 0    Multiple Vitamin (MULTIVITAMIN WITH MINERALS) TABS tablet Take 1 tablet by mouth daily.    warfarin (COUMADIN) 5 MG tablet Take 2.5-5 mg by mouth daily. Takes 5 mg on Sunday, Tuesday and Thursday and 2.5 mg all other days      STOP taking these  medications     alendronate (FOSAMAX) 70 MG tablet        Allergies  Allergen Reactions  . Codeine     REACTION: vomiting      The results of significant diagnostics from this hospitalization (including imaging, microbiology, ancillary and laboratory) are listed below for reference.    Significant Diagnostic Studies: Dg Chest 2 View  Result Date: 03/17/2017 CLINICAL DATA:  Shortness of breath EXAM: CHEST  2 VIEW COMPARISON:  03/10/2017, 02/08/2013 FINDINGS: Mild cardiomegaly  with central vascular congestion. Streaky bibasilar atelectasis or infiltrates and tiny effusions. Aortic atherosclerosis. No pneumothorax. IMPRESSION: Cardiomegaly with central vascular congestion and tiny pleural effusions. Streaky bibasilar atelectasis or infiltrates. Electronically Signed   By: Jasmine Pang M.D.   On: 03/17/2017 21:36   Dg Chest 2 View  Result Date: 03/10/2017 CLINICAL DATA:  Shortness of breath.  Left arm pain. EXAM: CHEST  2 VIEW COMPARISON:  02/08/2013 FINDINGS: There is mild bilateral interstitial thickening. There is no focal parenchymal opacity. There is no pleural effusion or pneumothorax. There is stable cardiomegaly. The osseous structures are unremarkable. IMPRESSION: Cardiomegaly with mild pulmonary vascular congestion. Electronically Signed   By: Elige Ko   On: 03/10/2017 11:47    Microbiology: No results found for this or any previous visit (from the past 240 hour(s)).   Labs: Basic Metabolic Panel:  Recent Labs Lab 03/17/17 1318 03/18/17 0323 03/19/17 1050  NA 141  --  142  K 3.6  --  3.2*  CL 106  --  103  CO2 27  --  29  GLUCOSE 113*  --  133*  BUN 14  --  14  CREATININE 0.98  --  0.98  CALCIUM 9.6  --  9.4  MG  --  1.8  --    Liver Function Tests:  Recent Labs Lab 03/19/17 1050  AST 35  ALT 31  ALKPHOS 72  BILITOT 2.0*  PROT 6.1*  ALBUMIN 3.7   No results for input(s): LIPASE, AMYLASE in the last 168 hours. No results for input(s): AMMONIA in the last 168 hours. CBC:  Recent Labs Lab 03/17/17 1318 03/19/17 0716  WBC 8.1 8.5  HGB 13.3 13.9  HCT 40.7 41.4  MCV 94.2 94.5  PLT 260 264   Cardiac Enzymes: No results for input(s): CKTOTAL, CKMB, CKMBINDEX, TROPONINI in the last 168 hours. BNP: BNP (last 3 results)  Recent Labs  03/17/17 1318  BNP 484.4*    ProBNP (last 3 results) No results for input(s): PROBNP in the last 8760 hours.  CBG: No results for input(s): GLUCAP in the last 168  hours.     SignedRhetta Mura MD   [facility] 03/19/2017, 1:55 PM

## 2017-03-19 NOTE — Progress Notes (Signed)
ANTICOAGULATION CONSULT NOTE   Pharmacy Consult for Warfarin  Indication: atrial fibrillation  Allergies  Allergen Reactions  . Codeine     REACTION: vomiting   Vital Signs: Temp: 98.9 F (37.2 C) (09/27 0652) Temp Source: Oral (09/27 0652) BP: 134/55 (09/27 1226) Pulse Rate: 74 (09/27 1226)  Labs:  Recent Labs  03/17/17 1318 03/18/17 0323 03/19/17 0716 03/19/17 1050  HGB 13.3  --  13.9  --   HCT 40.7  --  41.4  --   PLT 260  --  264  --   LABPROT  --  39.2* 35.3*  --   INR  --  4.09* 3.56  --   CREATININE 0.98  --   --  0.98    Estimated Creatinine Clearance: 54.6 mL/min (by C-G formula based on SCr of 0.98 mg/dL).  Assessment: 81 y/o F here with shortness of breath continues on warfarin for history of afib. INR was supratherapeutic at 4.09. INR decreased to 3.56. CBC remains stable. No signs/symptoms of bleeding noted.   Goal of Therapy:  INR 2-3 Monitor platelets by anticoagulation protocol: Yes   Plan:  Continue to hold warfarin Daily INR  Girard Cooter, PharmD Clinical Pharmacist  Phone: [phone] 03/19/2017,1:18 PM

## 2017-03-19 NOTE — Progress Notes (Signed)
Pt has orders to be discharged. Discharge instructions given and pt and husband have no additional questions at this time. Medication regimen reviewed and pt educated. Pt verbalized understanding and has no additional questions. Telemetry box removed. IV removed and site in good condition. Pt stable and waiting for transportation.

## 2017-03-20 NOTE — Progress Notes (Addendum)
CM attempted to call patient at home x2; no answer; will try again; Wanda Mckenzie [phone]  1:36 pm - attempted to call patient again at her home no answer; Wanda Mckenzie [phone]

## 2017-08-20 DIAGNOSIS — I509 Heart failure, unspecified: Secondary | ICD-10-CM | POA: Insufficient documentation

## 2018-04-06 DIAGNOSIS — I6990 Unspecified sequelae of unspecified cerebrovascular disease: Secondary | ICD-10-CM | POA: Insufficient documentation

## 2018-04-07 ENCOUNTER — Emergency Department (HOSPITAL_COMMUNITY): Payer: Medicare HMO

## 2018-04-07 ENCOUNTER — Encounter (HOSPITAL_COMMUNITY): Payer: Self-pay | Admitting: Emergency Medicine

## 2018-04-07 ENCOUNTER — Emergency Department (HOSPITAL_COMMUNITY)
Admission: EM | Admit: 2018-04-07 | Discharge: 2018-04-07 | Disposition: A | Discharge status: Home / Self Care | Payer: Medicare HMO | Attending: Emergency Medicine | Admitting: Emergency Medicine

## 2018-04-07 ENCOUNTER — Other Ambulatory Visit: Payer: Self-pay

## 2018-04-07 DIAGNOSIS — Z8673 Personal history of transient ischemic attack (TIA), and cerebral infarction without residual deficits: Secondary | ICD-10-CM | POA: Insufficient documentation

## 2018-04-07 DIAGNOSIS — Y9389 Activity, other specified: Secondary | ICD-10-CM | POA: Diagnosis not present

## 2018-04-07 DIAGNOSIS — X58XXXA Exposure to other specified factors, initial encounter: Secondary | ICD-10-CM | POA: Diagnosis not present

## 2018-04-07 DIAGNOSIS — Y929 Unspecified place or not applicable: Secondary | ICD-10-CM | POA: Insufficient documentation

## 2018-04-07 DIAGNOSIS — Z96642 Presence of left artificial hip joint: Secondary | ICD-10-CM | POA: Insufficient documentation

## 2018-04-07 DIAGNOSIS — F0390 Unspecified dementia without behavioral disturbance: Secondary | ICD-10-CM | POA: Diagnosis not present

## 2018-04-07 DIAGNOSIS — Z7901 Long term (current) use of anticoagulants: Secondary | ICD-10-CM | POA: Diagnosis not present

## 2018-04-07 DIAGNOSIS — Y998 Other external cause status: Secondary | ICD-10-CM | POA: Diagnosis not present

## 2018-04-07 DIAGNOSIS — Z79899 Other long term (current) drug therapy: Secondary | ICD-10-CM | POA: Diagnosis not present

## 2018-04-07 DIAGNOSIS — S3992XA Unspecified injury of lower back, initial encounter: Secondary | ICD-10-CM | POA: Diagnosis present

## 2018-04-07 DIAGNOSIS — S300XXA Contusion of lower back and pelvis, initial encounter: Secondary | ICD-10-CM | POA: Insufficient documentation

## 2018-04-07 DIAGNOSIS — S20229A Contusion of unspecified back wall of thorax, initial encounter: Secondary | ICD-10-CM

## 2018-04-07 DIAGNOSIS — J449 Chronic obstructive pulmonary disease, unspecified: Secondary | ICD-10-CM | POA: Insufficient documentation

## 2018-04-07 DIAGNOSIS — I10 Essential (primary) hypertension: Secondary | ICD-10-CM | POA: Diagnosis not present

## 2018-04-07 LAB — CBC
HCT: 42.9 % (ref 36.0–46.0)
HEMOGLOBIN: 13.6 g/dL (ref 12.0–15.0)
MCH: 30.8 pg (ref 26.0–34.0)
MCHC: 31.7 g/dL (ref 30.0–36.0)
MCV: 97.3 fL (ref 80.0–100.0)
NRBC: 0 % (ref 0.0–0.2)
Platelets: 286 10*3/uL (ref 150–400)
RBC: 4.41 MIL/uL (ref 3.87–5.11)
RDW: 12.5 % (ref 11.5–15.5)
WBC: 7.2 10*3/uL (ref 4.0–10.5)

## 2018-04-07 NOTE — Discharge Instructions (Signed)
Take over-the-counter medications including Tylenol for pain, try applying ice or heat to the area, follow-up with your doctor in a week or so to be rechecked if the symptoms have not resolved, return to the emergency room as needed for worsening symptoms

## 2018-04-07 NOTE — ED Provider Notes (Signed)
[facility] EMERGENCY DEPARTMENT Provider Note   CSN: [id number] Arrival date & time: 04/07/18  1155     History   Chief Complaint Chief Complaint  Patient presents with  . Bleeding/Bruising    HPI Wanda Mckenzie is a 82 y.o. female.  HPI She presents to the emergency room for evaluation of pain and bruising in her lower back.  Patient has history of strokes and has some memory issues.  She does not recall having any falls.  Patient started complaining some pain in her lower back a few days ago.  The patient noticed a large bruise a couple of days ago.  They have been monitoring it.  She went to see her primary care doctor yesterday and was switched from Coumadin to 1 of the NOACS.  Patient just started taking that medication this morning.  This morning the patient was complaining of more pain.  Degrees does not seem to be increasing in size.  Patient's husband was concerned that maybe she broke some ribs so he brought her into the emergency room for evaluation.  Patient denies any fevers or chills.  No chest pain or shortness of breath.  No abdominal pain.  No headache or vomiting.  No fevers or chills. Past Medical History:  Diagnosis Date  . Atrial fibrillation (HCC)   . COPD (chronic obstructive pulmonary disease) (HCC)   . Dyslipidemia   . Essential hypertension   . History of cardiac catheterization    Normal coronaries June 2015  . History of stroke    Right MCA distribution     Patient Active Problem List   Diagnosis Date Noted  . Acute on chronic combined systolic and diastolic CHF (congestive heart failure) (HCC) 03/18/2017  . Memory change 06/05/2014  . Dementia (HCC) 12/01/2013  . Bradycardia 12/01/2013  . Syncope and collapse 11/21/2013  . Varicose veins of lower extremities with other complications 02/02/2013  . Essential hypertension   . Chest pain, normal coronary arteries   . Dyslipidemia   . History of CVA (cerebrovascular accident)   . Aortic  insufficiency   . Flushing   . Hypotension   . COPD (chronic obstructive pulmonary disease) (HCC)   . Chronic atrial fibrillation   . Ejection fraction   . Carotid bruit   . Warfarin anticoagulation     Past Surgical History:  Procedure Laterality Date  . ABDOMINAL HYSTERECTOMY  1975  . FRACTURE SURGERY Left   . LEFT HEART CATHETERIZATION WITH CORONARY ANGIOGRAM N/A 11/23/2013   Procedure: LEFT HEART CATHETERIZATION WITH CORONARY ANGIOGRAM;  Surgeon: Kathleene Hazel, MD;  Location: [facility] CATH LAB;  Service: Cardiovascular;  Laterality: N/A;  . LEFT HIP HEMIARTHROPLASTY    . TRIBUTARY VARICOSITIES OF RIGHT LEG  05/23/2002  . VEIN LIGATION AND STRIPPING Right 02/08/2013   Procedure: Segmental excision of painful varicose veins right lower extremity;  Surgeon: Chuck Hint, MD;  Location: [facility] OR;  Service: Vascular;  Laterality: Right;     OB History   None      Home Medications    Prior to Admission medications   Medication Sig Start Date End Date Taking? Authorizing Provider  atorvastatin (LIPITOR) 80 MG tablet Take 80 mg by mouth daily.      [provider]  fenofibrate 160 MG tablet Take 160 mg by mouth daily.      [provider]  Folic Acid-Vit B6-Vit B12 (FOLBEE) 2.5-25-1 MG TABS Take 1 tablet by mouth daily.  [provider]  furosemide (LASIX) 40 MG tablet Take 1 tablet (40 mg total) by mouth 2 (two) times daily. 03/19/17   Rhetta Mura, MD  losartan (COZAAR) 100 MG tablet TAKE 1 TABLET ONCE DAILY. Patient taking differently: TAKE 100MG  BY MOUTH ONCE DAILY. 09/04/15   Jonelle Sidle, MD  Memantine HCl-Donepezil HCl [facility]) 28-10 MG CP24 Take 1 capsule by mouth at bedtime.    [provider]  metoprolol tartrate (LOPRESSOR) 25 MG tablet TAKE (1/2) TABLET TWICE DAILY. Patient taking differently: Take 12.5 mg by mouth 2 (two) times daily.  09/04/16   Jonelle Sidle, MD  Multiple Vitamin (MULTIVITAMIN WITH  MINERALS) TABS tablet Take 1 tablet by mouth daily. 11/24/13   Leone Brand, NP  potassium chloride SA (K-DUR,KLOR-CON) 20 MEQ tablet Take 2 tablets (40 mEq total) by mouth 2 (two) times daily. 03/19/17   Rhetta Mura, MD  warfarin (COUMADIN) 5 MG tablet Take 2.5-5 mg by mouth daily. Takes 5 mg on Sunday, Tuesday and Thursday and 2.5 mg all other days    [provider]    Family History Family History  Problem Relation Age of Onset  . Stroke Father   . Cancer Mother   . Hypertension Mother   . Cancer Sister   . Cancer Sister   . Cancer Sister     Social History Social History   Tobacco Use  . Smoking status: Never Smoker  . Smokeless tobacco: Never Used  Substance Use Topics  . Alcohol use: No    Alcohol/week: 0.0 standard drinks  . Drug use: No     Allergies   Codeine   Review of Systems Review of Systems  All other systems reviewed and are negative.    Physical Exam Updated Vital Signs BP (!) 146/70 (BP Location: Right Arm)   Pulse 94   Temp 97.6 F (36.4 C) (Oral)   Resp 16   Ht 1.829 m (6')   Wt 86.2 kg   SpO2 97%   BMI 25.77 kg/m   Physical Exam  Constitutional: She appears well-developed and well-nourished. No distress.  HENT:  Head: Normocephalic and atraumatic.  Right Ear: External ear normal.  Left Ear: External ear normal.  Eyes: Conjunctivae are normal. Right eye exhibits no discharge. Left eye exhibits no discharge. No scleral icterus.  Neck: Neck supple. No tracheal deviation present.  Cardiovascular: Normal rate, regular rhythm and intact distal pulses.  Pulmonary/Chest: Effort normal and breath sounds normal. No stridor. No respiratory distress. She has no wheezes. She has no rales.  Abdominal: Soft. Bowel sounds are normal. She exhibits no distension. There is no tenderness. There is no rebound and no guarding.  Musculoskeletal: She exhibits no edema.       Thoracic back: She exhibits tenderness (mild distal lower  thoracic spine and lower bilateral ribs).       Lumbar back: She exhibits tenderness. She exhibits no swelling and no edema.       Back:  Large dark purple bruise primarily midline lumbar spine region fanning out laterally in a triangle shape pattern; area of bruising depicted in illulstration  Neurological: She is alert. She has normal strength. No cranial nerve deficit (no facial droop, extraocular movements intact, no slurred speech) or sensory deficit. She exhibits normal muscle tone. She displays no seizure activity. Coordination normal.  Skin: Skin is warm and dry. No rash noted.  Psychiatric: She has a normal mood and affect.  Nursing note and vitals reviewed.  ED Treatments / Results  Labs (all labs ordered are listed, but only abnormal results are displayed) Labs Reviewed  CBC    Radiology Dg Chest 2 View  Result Date: 04/07/2018 CLINICAL DATA:  Bruising over the mid back with back pain and tenderness to the right of midline. Unclear history of a fall. Memory difficulty. Multiple CVAs. History of COPD, atrial fibrillation. EXAM: CHEST - 2 VIEW COMPARISON:  Chest x-ray of March 17, 2017 FINDINGS: The lungs remain mildly hyperinflated. Patchy interstitial densities are present in the right lower lobe and lateral to the left heart border. There is no pleural effusion or pneumothorax. The heart and pulmonary vascularity are normal. There is calcification in the wall of the aortic arch. There is mild superior endplate depression of the body of T11 IMPRESSION: Chronic bronchitic changes. Patchy atelectasis or scarring in the lower lobes not greatly changed from the previous study. No CHF. Thoracic aortic atherosclerosis. No acute bony abnormality. Electronically Signed   By: David  [place] M.D.   On: 04/07/2018 13:10   Dg Lumbar Spine Complete  Result Date: 04/07/2018 CLINICAL DATA:  Right-sided back pain and bruising. Unclear history of a fall. EXAM: LUMBAR SPINE - COMPLETE 4+  VIEW COMPARISON:  None. FINDINGS: The lumbar vertebral bodies are preserved in height. There is mild disc space narrowing at L4-5 with grade 1 anterolisthesis of L4 with respect to L5. There is no spondylolisthesis. There is mild facet joint hypertrophy at L4-5 and at L5-S1. The pedicles and transverse processes are grossly normal. The observed portions of the sacrum are normal. IMPRESSION: Mild degenerative disc space narrowing at L4-5. Mild facet joint hypertrophy at this level and at L5-S1. Grade 1 anterolisthesis of L4 with respect L5 likely on the basis of degenerative disc disease. Electronically Signed   By: David  [place] M.D.   On: 04/07/2018 13:11    Procedures Procedures (including critical care time)  Medications Ordered in ED Medications - No data to display   Initial Impression / Assessment and Plan / ED Course  I have reviewed the triage vital signs and the nursing notes.  Pertinent labs & imaging results that were available during my care of the patient were reviewed by me and considered in my medical decision making (see chart for details).  Clinical Course as of Apr 08 1343  Wed Apr 07, 2018  1209 Labs noted in care everywhere.  10/15 INR 3.1 (A)   [JK]  1337 CBC is normal  CBC [JK]  1338 X-rays reviewed.  No acute fractures noted   [JK]    Clinical Course User Index [JK] Linwood Dibbles, MD    Patient's labs and x-rays are reassuring.  Her INR was checked yesterday at the primary care doctor's office.  No need to repeat today.  Patient's findings are consistent with a contusion.  Patient most likely fell against something but cannot recall it because of her memory issues.  Her anticoagulants are certainly a contributing fracture but there does not appear to be any evidence of anemia or serious injury associated with her contusion.  She is stable for discharge.  Discussed Tylenol for pain and other supportive measures such as ice  Final Clinical Impressions(s) / ED Diagnoses    Final diagnoses:  Contusion of back, unspecified laterality, initial encounter    ED Discharge Orders    None       Linwood Dibbles, MD 04/07/18 1346

## 2018-04-07 NOTE — ED Triage Notes (Signed)
Pt has bruising on back. Pt reports back pain and tenderness. Pt taken off of Coumadin yesterday. Pt unsure if she has fallen. Husband states she has had multiple strokes and have difficulty remembering things.

## 2019-01-07 ENCOUNTER — Other Ambulatory Visit: Payer: Self-pay

## 2019-01-10 ENCOUNTER — Encounter: Payer: Self-pay | Admitting: Family Medicine

## 2019-01-10 ENCOUNTER — Ambulatory Visit (INDEPENDENT_AMBULATORY_CARE_PROVIDER_SITE_OTHER): Payer: Medicare HMO | Admitting: Family Medicine

## 2019-01-10 ENCOUNTER — Other Ambulatory Visit: Payer: Self-pay

## 2019-01-10 VITALS — BP 134/80 | HR 64 | Temp 97.8°F | Ht 72.0 in | Wt 174.0 lb

## 2019-01-10 DIAGNOSIS — Z7901 Long term (current) use of anticoagulants: Secondary | ICD-10-CM

## 2019-01-10 DIAGNOSIS — I10 Essential (primary) hypertension: Secondary | ICD-10-CM

## 2019-01-10 DIAGNOSIS — I482 Chronic atrial fibrillation, unspecified: Secondary | ICD-10-CM

## 2019-01-10 DIAGNOSIS — J449 Chronic obstructive pulmonary disease, unspecified: Secondary | ICD-10-CM

## 2019-01-10 DIAGNOSIS — I509 Heart failure, unspecified: Secondary | ICD-10-CM | POA: Diagnosis not present

## 2019-01-10 DIAGNOSIS — E785 Hyperlipidemia, unspecified: Secondary | ICD-10-CM

## 2019-01-10 DIAGNOSIS — F0150 Vascular dementia without behavioral disturbance: Secondary | ICD-10-CM

## 2019-01-10 DIAGNOSIS — Z7689 Persons encountering health services in other specified circumstances: Secondary | ICD-10-CM

## 2019-01-10 LAB — COAGUCHEK XS/INR WAIVED
INR: 3.1 — ABNORMAL HIGH (ref 0.9–1.1)
Prothrombin Time: 36.7 s

## 2019-01-10 MED ORDER — POTASSIUM CHLORIDE CRYS ER 20 MEQ PO TBCR: 20.0000 meq | EXTENDED_RELEASE_TABLET | Freq: Two times a day (BID) | ORAL | 5 refills | Status: DC

## 2019-01-10 MED ORDER — ALBUTEROL SULFATE HFA 108 (90 BASE) MCG/ACT IN AERS: 2.0000 | INHALATION_SPRAY | Freq: Four times a day (QID) | RESPIRATORY_TRACT | 2 refills | Status: AC | PRN

## 2019-01-10 MED ORDER — NAMZARIC 28-10 MG PO CP24: 1.0000 | ORAL_CAPSULE | Freq: Every day | ORAL | 5 refills | Status: DC

## 2019-01-10 MED ORDER — LOSARTAN POTASSIUM 100 MG PO TABS: 100.0000 mg | ORAL_TABLET | Freq: Every day | ORAL | 5 refills | Status: DC

## 2019-01-10 MED ORDER — WARFARIN SODIUM 5 MG PO TABS: ORAL_TABLET | ORAL | 5 refills | Status: DC

## 2019-01-10 MED ORDER — FUROSEMIDE 40 MG PO TABS: 40.0000 mg | ORAL_TABLET | Freq: Two times a day (BID) | ORAL | 5 refills | Status: DC

## 2019-01-10 MED ORDER — METOPROLOL TARTRATE 25 MG PO TABS: 12.5000 mg | ORAL_TABLET | Freq: Two times a day (BID) | ORAL | 5 refills | Status: DC

## 2019-01-10 MED ORDER — AEROCHAMBER PLUS MISC: 2 refills | Status: AC

## 2019-01-10 NOTE — Progress Notes (Signed)
New Patient Office Visit  Assessment & Plan:  1-2. Chronic atrial fibrillation/Warfarin anticoagulation - INR 3.1 today; patient will continue on current dosage of warfarin and return in 1 week to have INR re-checked.  - CoaguChek XS/INR Waived - warfarin (COUMADIN) 5 MG tablet; Takes 5 mg by mouth on Sunday, Tuesday and Thursday and 2.5 mg by mouth on Monday, Wednesday, Friday, and Saturday.  Dispense: 20 tablet; Refill: 5  3. Essential hypertension - Well controlled on current regimen.  - CMP14+EGFR - losartan (COZAAR) 100 MG tablet; Take 1 tablet (100 mg total) by mouth daily.  Dispense: 30 tablet; Refill: 5 - metoprolol tartrate (LOPRESSOR) 25 MG tablet; Take 0.5 tablets (12.5 mg total) by mouth 2 (two) times daily.  Dispense: 30 tablet; Refill: 5 - potassium chloride SA (K-DUR) 20 MEQ tablet; Take 1 tablet (20 mEq total) by mouth 2 (two) times daily.  Dispense: 60 tablet; Refill: 5  4. Chronic congestive heart failure, unspecified heart failure type ([place]) - Well controlled on current regimen.  - furosemide (LASIX) 40 MG tablet; Take 1 tablet (40 mg total) by mouth 2 (two) times daily.  Dispense: 60 tablet; Refill: 5 - losartan (COZAAR) 100 MG tablet; Take 1 tablet (100 mg total) by mouth daily.  Dispense: 30 tablet; Refill: 5 - metoprolol tartrate (LOPRESSOR) 25 MG tablet; Take 0.5 tablets (12.5 mg total) by mouth 2 (two) times daily.  Dispense: 30 tablet; Refill: 5 - potassium chloride SA (K-DUR) 20 MEQ tablet; Take 1 tablet (20 mEq total) by mouth 2 (two) times daily.  Dispense: 60 tablet; Refill: 5  5. Vascular dementia without behavioral disturbance ([place]) - I did call and ask the husband after the visit about advance directives, including a living will or healthcare power of attorney, and he reports she does not have any of these documents. Patient scored a 10/30 with failed clock drawing on her MMSE. Education provided on dementia for the caregiver.  - CMP14+EGFR - CBC With  Differential - RPR - Sedimentation rate - Thyroid Panel With TSH - Vitamin B12 - Memantine HCl-Donepezil HCl (NAMZARIC) 28-10 MG CP24; Take 1 capsule by mouth at bedtime.  Dispense: 30 capsule; Refill: 5  6. Dyslipidemia - Controlled. Patient was previously taking Atorvastatin 40 mg QD, it is unclear why she is no longer taking this. Will discuss with family at her next visit.  - CMP14+EGFR  7. Chronic obstructive pulmonary disease, unspecified COPD type ([place]) - Inhaler and spacer prescribed for intermittent shortness of breath.  - albuterol (VENTOLIN HFA) 108 (90 Base) MCG/ACT inhaler; Inhale 2 puffs into the lungs every 6 (six) hours as needed for wheezing or shortness of breath.  Dispense: 18 g; Refill: 2 - Spacer/Aero-Holding Chambers (AEROCHAMBER PLUS) inhaler; Use as instructed  Dispense: 1 each; Refill: 2  8. Encounter to establish care   Follow-up: Return in about 1 week (around 01/17/2019) for INR.   Hendricks Limes, MSN, APRN, FNP-C [facility]  Subjective:  Patient ID: Wanda Mckenzie, female    DOB: Nov 15, 1935  Age: 83 y.o. MRN: [id number]  CC:  Chief Complaint  Patient presents with  . Establish Care    HPI Wanda Mckenzie presents to establish care. She is transferring care from Dr. Murrell Redden office as he has retired and the office has closed. She is accompanied today by her granddaughter, who she is okay with being present.  The granddaughter reports Mrs. Wanda Mckenzie tells her sometimes that she feels like she cannot breath very well.  She does have a history of COPD and does not have a rescue inhaler.   Dementia: The granddaughter is also concerned that people are forging Mrs. Wanda Mckenzie's name at the bank or having her sign things for them because they know she will since she is not making good decisions due to her dementia. She was told by the bank to have her evaluated and that they would need a letter declaring the patient incompetent. [name]  son Wanda Mckenzie is her POA per the granddaughter, but we do not have documentation of this in her chart.   MMSE - Mini Mental State Exam 01/10/2019  Orientation to time 0  Orientation to Place 2  Registration 2  Attention/ Calculation 0  Recall 0  Language- name 2 objects 2  Language- repeat 0  Language- follow 3 step command 3  Language- read & follow direction 1  Write a sentence 0  Copy design 0  Total score 10  *Unable to draw clock*   Review of Systems  Unable to perform ROS: Dementia    Current Outpatient Medications:  .  furosemide (LASIX) 40 MG tablet, Take 1 tablet (40 mg total) by mouth 2 (two) times daily., Disp: 60 tablet, Rfl: 5 .  losartan (COZAAR) 100 MG tablet, Take 1 tablet (100 mg total) by mouth daily., Disp: 30 tablet, Rfl: 5 .  meloxicam (MOBIC) 7.5 MG tablet, , Disp: , Rfl:  .  Memantine HCl-Donepezil HCl (NAMZARIC) 28-10 MG CP24, Take 1 capsule by mouth at bedtime., Disp: 30 capsule, Rfl: 5 .  metoprolol tartrate (LOPRESSOR) 25 MG tablet, Take 0.5 tablets (12.5 mg total) by mouth 2 (two) times daily., Disp: 30 tablet, Rfl: 5 .  potassium chloride SA (K-DUR) 20 MEQ tablet, Take 1 tablet (20 mEq total) by mouth 2 (two) times daily., Disp: 60 tablet, Rfl: 5 .  warfarin (COUMADIN) 5 MG tablet, Takes 5 mg by mouth on Sunday, Tuesday and Thursday and 2.5 mg by mouth on Monday, Wednesday, Friday, and Saturday., Disp: 20 tablet, Rfl: 5 .  albuterol (VENTOLIN HFA) 108 (90 Base) MCG/ACT inhaler, Inhale 2 puffs into the lungs every 6 (six) hours as needed for wheezing or shortness of breath., Disp: 18 g, Rfl: 2 .  Spacer/Aero-Holding Chambers (AEROCHAMBER PLUS) inhaler, Use as instructed, Disp: 1 each, Rfl: 2  Allergies  Allergen Reactions  . Codeine     REACTION: vomiting    Past Medical History:  Diagnosis Date  . Atrial fibrillation ([place])   . COPD (chronic obstructive pulmonary disease) ([place])   . Dyslipidemia   . Essential hypertension   . History of cardiac  catheterization 11/2013   Normal coronaries  . History of stroke    Right MCA distribution     Past Surgical History:  Procedure Laterality Date  . ABDOMINAL HYSTERECTOMY  1975  . FRACTURE SURGERY Left   . LEFT HEART CATHETERIZATION WITH CORONARY ANGIOGRAM N/A 11/23/2013   Procedure: LEFT HEART CATHETERIZATION WITH CORONARY ANGIOGRAM;  Surgeon: Burnell Blanks, MD;  Location: [facility] CATH LAB;  Service: Cardiovascular;  Laterality: N/A;  . LEFT HIP HEMIARTHROPLASTY    . TRIBUTARY VARICOSITIES OF RIGHT LEG  05/23/2002  . VEIN LIGATION AND STRIPPING Right 02/08/2013   Procedure: Segmental excision of painful varicose veins right lower extremity;  Surgeon: Angelia Mould, MD;  Location: [facility];  Service: Vascular;  Laterality: Right;    Family History  Problem Relation Age of Onset  . Stroke Father   . Cancer Mother   .  Hypertension Mother   . Cancer Sister   . Cancer Sister   . Cancer Sister   . Hypertension Son     Social History   Socioeconomic History  . Marital status: Married    Spouse name: Not on file  . Number of children: Not on file  . Years of education: Not on file  . Highest education level: Not on file  Occupational History  . Not on file  Social Needs  . Financial resource strain: Not on file  . Food insecurity    Worry: Not on file    Inability: Not on file  . Transportation needs    Medical: Not on file    Non-medical: Not on file  Tobacco Use  . Smoking status: Never Smoker  . Smokeless tobacco: Never Used  Substance and Sexual Activity  . Alcohol use: No    Alcohol/week: 0.0 standard drinks  . Drug use: No  . Sexual activity: Not on file  Lifestyle  . Physical activity    Days per week: Not on file    Minutes per session: Not on file  . Stress: Not on file  Relationships  . Social Herbalist on phone: Not on file    Gets together: Not on file    Attends religious service: Not on file    Active member of club or  organization: Not on file    Attends meetings of clubs or organizations: Not on file    Relationship status: Not on file  . Intimate partner violence    Fear of current or ex partner: Not on file    Emotionally abused: Not on file    Physically abused: Not on file    Forced sexual activity: Not on file  Other Topics Concern  . Not on file  Social History Narrative  . Not on file    Objective:   Today's Vitals: BP 134/80   Pulse 64   Temp 97.8 F (36.6 C) (Oral)   Ht 6' (1.829 m)   Wt 174 lb (78.9 kg)   BMI 23.60 kg/m   Physical Exam Vitals signs reviewed.  Constitutional:      General: She is not in acute distress.    Appearance: Normal appearance. She is normal weight. She is not ill-appearing, toxic-appearing or diaphoretic.  HENT:     Head: Normocephalic and atraumatic.  Eyes:     General: No scleral icterus.       Right eye: No discharge.        Left eye: No discharge.     Conjunctiva/sclera: Conjunctivae normal.  Neck:     Musculoskeletal: Normal range of motion.  Cardiovascular:     Rate and Rhythm: Normal rate and regular rhythm.     Heart sounds: Normal heart sounds. No murmur. No friction rub. No gallop.   Pulmonary:     Effort: Pulmonary effort is normal. No respiratory distress.     Breath sounds: Normal breath sounds. No stridor. No wheezing, rhonchi or rales.  Musculoskeletal: Normal range of motion.  Skin:    General: Skin is warm and dry.     Capillary Refill: Capillary refill takes less than 2 seconds.  Neurological:     General: No focal deficit present.     Mental Status: She is alert and oriented to person, place, and time. Mental status is at baseline.  Psychiatric:        Attention and Perception: Attention and perception normal.  Mood and Affect: Mood normal. Affect is tearful.        Speech: Speech normal.        Behavior: Behavior normal. Behavior is cooperative.        Thought Content: Thought content normal.        Cognition  and Memory: Cognition is impaired. Memory is impaired. She exhibits impaired recent memory and impaired remote memory.        Judgment: Judgment normal.

## 2019-01-10 NOTE — Patient Instructions (Signed)
Wanda Mckenzie - Dementia Expert   Dementia Caregiver Guide Dementia is a term used to describe a number of symptoms that affect memory and thinking. The most common symptoms include:  Memory loss.  Trouble with language and communication.  Trouble concentrating.  Poor judgment.  Problems with reasoning.  Child-like behavior and language.  Extreme anxiety.  Angry outbursts.  Wandering from home or public places. Dementia usually gets worse slowly over time. In the early stages, people with dementia can stay independent and safe with some help. In later stages, they need help with daily tasks such as dressing, grooming, and using the bathroom. How to help the person with dementia cope Dementia can be frightening and confusing. Here are some tips to help the person with dementia cope with changes caused by the disease. General tips  Keep the person on track with his or her routine.  Try to identify areas where the person may need help.  Be supportive, patient, calm, and encouraging.  Gently remind the person that adjusting to changes takes time.  Help with the tasks that the person has asked for help with.  Keep the person involved in daily tasks and decisions as much as possible.  Encourage conversation, but try not to get frustrated or harried if the person struggles to find words or does not seem to appreciate your help. Communication tips  When the person is talking or seems frustrated, make eye contact and hold the person's hand.  Ask specific questions that need yes or no answers.  Use simple words, short sentences, and a calm voice. Only give one direction at a time.  When offering choices, limit them to just 1 or 2.  Avoid correcting the person in a negative way.  If the person is struggling to find the right words, gently try to help him or her. How to recognize symptoms of stress Symptoms of stress in caregivers include:  Feeling frustrated or angry with  the person with dementia.  Denying that the person has dementia or that his or her symptoms will not improve.  Feeling hopeless and unappreciated.  Difficulty sleeping.  Difficulty concentrating.  Feeling anxious, irritable, or depressed.  Developing stress-related health problems.  Feeling like you have too little time for your own life. Follow these instructions at home:   Make sure that you and the person you are caring for: ? Get regular sleep. ? Exercise regularly. ? Eat regular, nutritious meals. ? Drink enough fluid to keep your urine clear or pale yellow. ? Take over-the-counter and prescription medicines only as told by your health care providers. ? Attend all scheduled health care appointments.  Join a support group with others who are caregivers.  Ask about respite care resources so that you can have a regular break from the stress of caregiving.  Look for signs of stress in yourself and in the person you are caring for. If you notice signs of stress, take steps to manage it.  Consider any safety risks and take steps to avoid them.  Organize medications in a pill box for each day of the week.  Create a plan to handle any legal or financial matters. Get legal or financial advice if needed.  Keep a calendar in a central location to remind the person of appointments or other activities. Tips for reducing the risk of injury  Keep floors clear of clutter. Remove rugs, magazine racks, and floor lamps.  Keep hallways well lit, especially at night.  Put a handrail  and nonslip mat in the bathtub or shower.  Put childproof locks on cabinets that contain dangerous items, such as medicines, alcohol, guns, toxic cleaning items, sharp tools or utensils, matches, and lighters.  Put the locks in places where the person cannot see or reach them easily. This will help ensure that the person does not wander out of the house and get lost.  Be prepared for emergencies. Keep a  list of emergency phone numbers and addresses in a convenient area.  Remove car keys and lock garage doors so that the person does not try to get in the car and drive.  Have the person wear a bracelet that tracks locations and identifies the person as having memory problems. This should be worn at all times for safety. Where to find support: Many individuals and organizations offer support. These include:  Support groups for people with dementia and for caregivers.  Counselors or therapists.  Home health care services.  Adult day care centers. Where to find more information Alzheimer's Association: [employer] Contact a health care provider if:  The person's health is rapidly getting worse.  You are no longer able to care for the person.  Caring for the person is affecting your physical and emotional health.  The person threatens himself or herself, you, or anyone else. Summary  Dementia is a term used to describe a number of symptoms that affect memory and thinking.  Dementia usually gets worse slowly over time.  Take steps to reduce the person's risk of injury, and to plan for future care.  Caregivers need support, relief from caregiving, and time for their own lives. This information is not intended to replace advice given to you by your health care provider. Make sure you discuss any questions you have with your health care provider. Document Released: 05/13/2016 Document Revised: 05/22/2017 Document Reviewed: 05/13/2016 Elsevier Patient Education  2020 [employer].

## 2019-01-11 LAB — CMP14+EGFR
ALT: 14 IU/L (ref 0–32)
AST: 20 IU/L (ref 0–40)
Albumin/Globulin Ratio: 1.9 (ref 1.2–2.2)
Albumin: 4.1 g/dL (ref 3.7–4.7)
Alkaline Phosphatase: 71 IU/L (ref 39–117)
BUN/Creatinine Ratio: 13 (ref 12–28)
BUN: 20 mg/dL (ref 8–27)
Bilirubin Total: 0.9 mg/dL (ref 0.0–1.2)
CO2: 23 mmol/L (ref 20–29)
Calcium: 9.3 mg/dL (ref 8.7–10.3)
Chloride: 104 mmol/L (ref 96–106)
Creatinine, Ser: 1.56 mg/dL — ABNORMAL HIGH (ref 0.57–1.00)
GFR calc Af Amer: 36 mL/min/{1.73_m2} — ABNORMAL LOW (ref >59)
GFR calc non Af Amer: 31 mL/min/{1.73_m2} — ABNORMAL LOW (ref >59)
Globulin, Total: 2.2 g/dL (ref 1.5–4.5)
Glucose: 94 mg/dL (ref 65–99)
Potassium: 4.1 mmol/L (ref 3.5–5.2)
Sodium: 143 mmol/L (ref 134–144)
Total Protein: 6.3 g/dL (ref 6.0–8.5)

## 2019-01-11 LAB — CBC WITH DIFFERENTIAL
Basophils Absolute: 0.1 10*3/uL (ref 0.0–0.2)
Basos: 1 %
EOS (ABSOLUTE): 0.3 10*3/uL (ref 0.0–0.4)
Eos: 5 %
Hematocrit: 43.3 % (ref 34.0–46.6)
Hemoglobin: 14.2 g/dL (ref 11.1–15.9)
Immature Grans (Abs): 0 10*3/uL (ref 0.0–0.1)
Immature Granulocytes: 0 %
Lymphocytes Absolute: 2.2 10*3/uL (ref 0.7–3.1)
Lymphs: 32 %
MCH: 31.3 pg (ref 26.6–33.0)
MCHC: 32.8 g/dL (ref 31.5–35.7)
MCV: 96 fL (ref 79–97)
Monocytes Absolute: 0.5 10*3/uL (ref 0.1–0.9)
Monocytes: 7 %
Neutrophils Absolute: 3.9 10*3/uL (ref 1.4–7.0)
Neutrophils: 55 %
RBC: 4.53 x10E6/uL (ref 3.77–5.28)
RDW: 12.7 % (ref 11.7–15.4)
WBC: 7 10*3/uL (ref 3.4–10.8)

## 2019-01-11 LAB — THYROID PANEL WITH TSH
Free Thyroxine Index: 2.2 (ref 1.2–4.9)
T3 Uptake Ratio: 27 % (ref 24–39)
T4, Total: 8.1 ug/dL (ref 4.5–12.0)
TSH: 6.78 u[IU]/mL — ABNORMAL HIGH (ref 0.450–4.500)

## 2019-01-11 LAB — RPR: RPR Ser Ql: NONREACTIVE

## 2019-01-11 LAB — VITAMIN B12: Vitamin B-12: 1155 pg/mL (ref 232–1245)

## 2019-01-11 LAB — SEDIMENTATION RATE: Sed Rate: 11 mm/hr (ref 0–40)

## 2019-01-12 ENCOUNTER — Other Ambulatory Visit: Payer: Self-pay | Admitting: Family Medicine

## 2019-01-12 DIAGNOSIS — N183 Chronic kidney disease, stage 3 unspecified: Secondary | ICD-10-CM

## 2019-01-19 ENCOUNTER — Other Ambulatory Visit: Payer: Self-pay

## 2019-01-19 ENCOUNTER — Ambulatory Visit: Payer: Medicare HMO | Admitting: Family Medicine

## 2019-01-19 NOTE — Progress Notes (Signed)
Assessment & Plan:  1-2. Chronic atrial fibrillation/Warfarin anticoagulation Description   Continue current coumadin dose of 5 mg (1 tablet) on Sunday, Tuesday, and Thursday with 2.5 mg (0.5 tablet) on Monday, Wednesday, Friday, and Saturday.   INR today 2.8    3-4. Mixed hyperlipidemia/History of stroke - Restarted atorvastatin today due to history of stroke and hyperlipidemia. - atorvastatin (LIPITOR) 20 MG tablet; Take 1 tablet (20 mg total) by mouth daily.  Dispense: 90 tablet; Refill: 3   Follow up plan: Return in about 4 weeks (around 02/17/2019) for INR.  Hendricks Limes, MSN, APRN, FNP-C [facility]  Subjective:   Patient ID: Wanda Mckenzie, female    DOB: 09/12/1935, 83 y.o.   MRN: [id number]  HPI: Wanda Mckenzie is a 83 y.o. female presenting on 01/20/2019 for Anticoagulation  Patient is accompanied by her granddaughter, who she is okay with being present.   Patient is returning for an INR re-check. She was 3.1 on 01/10/2019. No medication changes were made that day as she is a new patient to me and has been on current dosage for "a long time". Patient has a history of A-Fib and has an INR goal of 2-3.   Additionally, statin use needs to be discussed. Patient was previously taking atorvastatin 40 mg QD. Her granddaughter reports this was discontinued by a Hospice nurse. Patient was signed up for Hospice as the "husband wanted 24 hour care" but the granddaughter reports they told them they would return when she had <6 months as she did not meet criteria.    ROS: Negative unless specifically indicated above in HPI.   Relevant past medical history reviewed and updated as indicated.   Allergies and medications reviewed and updated.   Current Outpatient Medications:  .  albuterol (VENTOLIN HFA) 108 (90 Base) MCG/ACT inhaler, Inhale 2 puffs into the lungs every 6 (six) hours as needed for wheezing or shortness of breath., Disp: 18 g, Rfl: 2 .   furosemide (LASIX) 40 MG tablet, Take 1 tablet (40 mg total) by mouth 2 (two) times daily., Disp: 60 tablet, Rfl: 5 .  losartan (COZAAR) 100 MG tablet, Take 1 tablet (100 mg total) by mouth daily., Disp: 30 tablet, Rfl: 5 .  Memantine HCl-Donepezil HCl (NAMZARIC) 28-10 MG CP24, Take 1 capsule by mouth at bedtime., Disp: 30 capsule, Rfl: 5 .  metoprolol tartrate (LOPRESSOR) 25 MG tablet, Take 0.5 tablets (12.5 mg total) by mouth 2 (two) times daily., Disp: 30 tablet, Rfl: 5 .  potassium chloride SA (K-DUR) 20 MEQ tablet, Take 1 tablet (20 mEq total) by mouth 2 (two) times daily., Disp: 60 tablet, Rfl: 5 .  Spacer/Aero-Holding Chambers (AEROCHAMBER PLUS) inhaler, Use as instructed, Disp: 1 each, Rfl: 2 .  warfarin (COUMADIN) 5 MG tablet, Takes 5 mg by mouth on Sunday, Tuesday and Thursday and 2.5 mg by mouth on Monday, Wednesday, Friday, and Saturday., Disp: 20 tablet, Rfl: 5 .  atorvastatin (LIPITOR) 20 MG tablet, Take 1 tablet (20 mg total) by mouth daily., Disp: 90 tablet, Rfl: 3  Allergies  Allergen Reactions  . Codeine     REACTION: vomiting    Objective:   BP (!) 161/83   Pulse 64   Temp (!) 96.8 F (36 C) (Temporal)   Ht 6\' 1"  (1.854 m)   Wt 178 lb 12.8 oz (81.1 kg)   BMI 23.59 kg/m    Physical Exam Vitals signs reviewed.  Constitutional:      General: She  is not in acute distress.    Appearance: Normal appearance. She is normal weight. She is not ill-appearing, toxic-appearing or diaphoretic.  HENT:     Head: Normocephalic and atraumatic.  Eyes:     General: No scleral icterus.       Right eye: No discharge.        Left eye: No discharge.     Conjunctiva/sclera: Conjunctivae normal.  Neck:     Musculoskeletal: Normal range of motion.  Cardiovascular:     Rate and Rhythm: Normal rate and regular rhythm.     Heart sounds: Normal heart sounds. No murmur. No friction rub. No gallop.   Pulmonary:     Effort: Pulmonary effort is normal. No respiratory distress.     Breath  sounds: Normal breath sounds. No stridor. No wheezing, rhonchi or rales.  Musculoskeletal: Normal range of motion.  Skin:    General: Skin is warm and dry.     Capillary Refill: Capillary refill takes less than 2 seconds.  Neurological:     General: No focal deficit present.     Mental Status: She is alert and oriented to person, place, and time. Mental status is at baseline.  Psychiatric:        Mood and Affect: Mood normal.        Behavior: Behavior normal.        Thought Content: Thought content normal.        Judgment: Judgment normal.

## 2019-01-20 ENCOUNTER — Ambulatory Visit (INDEPENDENT_AMBULATORY_CARE_PROVIDER_SITE_OTHER): Payer: Medicare HMO | Admitting: Family Medicine

## 2019-01-20 ENCOUNTER — Encounter: Payer: Self-pay | Admitting: Family Medicine

## 2019-01-20 VITALS — BP 161/83 | HR 64 | Temp 96.8°F | Ht 73.0 in | Wt 178.8 lb

## 2019-01-20 DIAGNOSIS — Z7901 Long term (current) use of anticoagulants: Secondary | ICD-10-CM | POA: Diagnosis not present

## 2019-01-20 DIAGNOSIS — Z8673 Personal history of transient ischemic attack (TIA), and cerebral infarction without residual deficits: Secondary | ICD-10-CM | POA: Diagnosis not present

## 2019-01-20 DIAGNOSIS — E782 Mixed hyperlipidemia: Secondary | ICD-10-CM

## 2019-01-20 DIAGNOSIS — I482 Chronic atrial fibrillation, unspecified: Secondary | ICD-10-CM

## 2019-01-20 LAB — COAGUCHEK XS/INR WAIVED
INR: 2.8 — ABNORMAL HIGH (ref 0.9–1.1)
Prothrombin Time: 34 s

## 2019-01-20 MED ORDER — ATORVASTATIN CALCIUM 20 MG PO TABS: 20.0000 mg | ORAL_TABLET | Freq: Every day | ORAL | 3 refills | Status: DC

## 2019-01-20 NOTE — Addendum Note (Signed)
Addended by: Thana Ates on: 01/20/2019 02:30 PM   Modules accepted: Orders

## 2019-01-20 NOTE — Addendum Note (Signed)
Addended by: Earlene Plater on: 01/20/2019 03:33 PM   Modules accepted: Orders

## 2019-02-04 ENCOUNTER — Encounter: Payer: Self-pay | Admitting: Family Medicine

## 2019-02-10 ENCOUNTER — Ambulatory Visit: Payer: Medicare HMO | Admitting: Family Medicine

## 2019-02-17 ENCOUNTER — Ambulatory Visit (INDEPENDENT_AMBULATORY_CARE_PROVIDER_SITE_OTHER): Payer: Medicare HMO | Admitting: Family Medicine

## 2019-02-17 ENCOUNTER — Other Ambulatory Visit: Payer: Self-pay

## 2019-02-17 ENCOUNTER — Encounter: Payer: Self-pay | Admitting: Family Medicine

## 2019-02-17 VITALS — BP 137/78 | HR 56 | Temp 98.1°F | Ht 73.0 in | Wt 176.0 lb

## 2019-02-17 DIAGNOSIS — R443 Hallucinations, unspecified: Secondary | ICD-10-CM

## 2019-02-17 DIAGNOSIS — Z7901 Long term (current) use of anticoagulants: Secondary | ICD-10-CM | POA: Diagnosis not present

## 2019-02-17 DIAGNOSIS — R410 Disorientation, unspecified: Secondary | ICD-10-CM

## 2019-02-17 DIAGNOSIS — I482 Chronic atrial fibrillation, unspecified: Secondary | ICD-10-CM

## 2019-02-17 LAB — MICROSCOPIC EXAMINATION
Epithelial Cells (non renal): >10 /hpf — AB (ref 0–10)
Renal Epithel, UA: NONE SEEN /hpf

## 2019-02-17 LAB — URINALYSIS, COMPLETE
Bilirubin, UA: NEGATIVE
Glucose, UA: NEGATIVE
Ketones, UA: NEGATIVE
Leukocytes,UA: NEGATIVE
Nitrite, UA: NEGATIVE
Protein,UA: NEGATIVE
RBC, UA: NEGATIVE
Specific Gravity, UA: 1.02 (ref 1.005–1.030)
Urobilinogen, Ur: 0.2 mg/dL (ref 0.2–1.0)
pH, UA: 5 (ref 5.0–7.5)

## 2019-02-17 LAB — COAGUCHEK XS/INR WAIVED
INR: 3 — ABNORMAL HIGH (ref 0.9–1.1)
Prothrombin Time: 36.6 s

## 2019-02-17 NOTE — Progress Notes (Signed)
Assessment & Plan:  1-2. Chronic atrial fibrillation/Warfarin anticoagulation Description   Continue current coumadin dose of 5 mg (1 tablet) on Sunday, Tuesday, and Thursday with 2.5 mg (0.5 tablet) on Monday, Wednesday, Friday, and Saturday.   INR today 3.0   - CoaguChek XS/INR Waived  3-4. Hallucinations/Confusion - Urinalysis negative. Encouraged adequate hydration. Bring patient inside during the hottest times of the day.  - Urinalysis, Complete   Return in about 4 weeks (around 03/17/2019) for INR.  Deliah Boston, MSN, APRN, FNP-C [facility]  Subjective:    Patient ID: Wanda Mckenzie, female    DOB: Jul 05, 1935, 83 y.o.   MRN: [id number]  Patient Care Team: [name], FNP as PCP - General (Family Medicine)   Chief Complaint:  Chief Complaint  Patient presents with  . Anticoagulation    HPI: Wanda Mckenzie is a 83 y.o. female presenting on 02/17/2019 for Anticoagulation  Patient is returning for an INR re-check. She has a history of atrial fibrillation and has an INR goal of 2-3. She was 2.8 on 01/20/2019. No medication changes were made due to INR being at goal.   New complaints: Granddaughter concerned that her dementia has increased over the last few days. She states "she just seems out of it". She was outside yesterday from 12-8 PM. Patient was hallucinating. She kept talking about a little boy and thought the floor was water. She was acting like she was climbing down something and almost fell. Granddaughter reports she pushed fluids with her when she got back home and she started acting more like herself. She does feel like she wasn't acting quite like herself prior to that, although not as bad. Yesterday was the worst day.    Relevant past medical, surgical, family and social history reviewed and updated as indicated. Interim medical history since our last visit reviewed.  Allergies and medications reviewed and updated.  DATA  REVIEWED: CHART IN EPIC  ROS: Negative unless specifically indicated above in HPI.    Current Outpatient Medications:  .  albuterol (VENTOLIN HFA) 108 (90 Base) MCG/ACT inhaler, Inhale 2 puffs into the lungs every 6 (six) hours as needed for wheezing or shortness of breath., Disp: 18 g, Rfl: 2 .  atorvastatin (LIPITOR) 20 MG tablet, Take 1 tablet (20 mg total) by mouth daily., Disp: 90 tablet, Rfl: 3 .  furosemide (LASIX) 40 MG tablet, Take 1 tablet (40 mg total) by mouth 2 (two) times daily., Disp: 60 tablet, Rfl: 5 .  losartan (COZAAR) 100 MG tablet, Take 1 tablet (100 mg total) by mouth daily., Disp: 30 tablet, Rfl: 5 .  Memantine HCl-Donepezil HCl (NAMZARIC) 28-10 MG CP24, Take 1 capsule by mouth at bedtime., Disp: 30 capsule, Rfl: 5 .  metoprolol tartrate (LOPRESSOR) 25 MG tablet, Take 0.5 tablets (12.5 mg total) by mouth 2 (two) times daily., Disp: 30 tablet, Rfl: 5 .  potassium chloride SA (K-DUR) 20 MEQ tablet, Take 1 tablet (20 mEq total) by mouth 2 (two) times daily., Disp: 60 tablet, Rfl: 5 .  Spacer/Aero-Holding Chambers (AEROCHAMBER PLUS) inhaler, Use as instructed, Disp: 1 each, Rfl: 2 .  warfarin (COUMADIN) 5 MG tablet, Takes 5 mg by mouth on Sunday, Tuesday and Thursday and 2.5 mg by mouth on Monday, Wednesday, Friday, and Saturday., Disp: 20 tablet, Rfl: 5  Allergies  Allergen Reactions  . Codeine     REACTION: vomiting   Past Medical History:  Diagnosis Date  . Atrial fibrillation (HCC)   .  COPD (chronic obstructive pulmonary disease) (HCC)   . Dyslipidemia   . Essential hypertension   . History of cardiac catheterization 11/2013   Normal coronaries  . History of stroke    Right MCA distribution     Past Surgical History:  Procedure Laterality Date  . ABDOMINAL HYSTERECTOMY  1975  . FRACTURE SURGERY Left   . LEFT HEART CATHETERIZATION WITH CORONARY ANGIOGRAM N/A 11/23/2013   Procedure: LEFT HEART CATHETERIZATION WITH CORONARY ANGIOGRAM;  Surgeon: Kathleene Hazelhristopher D  McAlhany, MD;  [facility];  Service: Cardiovascular;  Laterality: N/A;  . LEFT HIP HEMIARTHROPLASTY    . TRIBUTARY VARICOSITIES OF RIGHT LEG  05/23/2002  . VEIN LIGATION AND STRIPPING Right 02/08/2013   Procedure: Segmental excision of painful varicose veins right lower extremity;  Surgeon: Chuck Hinthristopher S Dickson, MD;  [facility];  Service: Vascular;  Laterality: Right;    Social History   Socioeconomic History  . Marital status: Married    Spouse name: Not on file  . Number of children: Not on file  . Years of education: Not on file  . Highest education level: Not on file  Occupational History  . Not on file  Social Needs  . Financial resource strain: Not on file  . Food insecurity    Worry: Not on file    Inability: Not on file  . Transportation needs    Medical: Not on file    Non-medical: Not on file  Tobacco Use  . Smoking status: Never Smoker  . Smokeless tobacco: Never Used  Substance and Sexual Activity  . Alcohol use: No    Alcohol/week: 0.0 standard drinks  . Drug use: No  . Sexual activity: Not on file  Lifestyle  . Physical activity    Days per week: Not on file    Minutes per session: Not on file  . Stress: Not on file  Relationships  . Social Musicianconnections    Talks on phone: Not on file    Gets together: Not on file    Attends religious service: Not on file    Active member of club or organization: Not on file    Attends meetings of clubs or organizations: Not on file    Relationship status: Not on file  . Intimate partner violence    Fear of current or ex partner: Not on file    Emotionally abused: Not on file    Physically abused: Not on file    Forced sexual activity: Not on file  Other Topics Concern  . Not on file  Social History Narrative  . Not on file        Objective:    BP 137/78   Pulse (!) 56   Temp 98.1 F (36.7 C) (Temporal)   Ht 6\' 1"  (1.854 m)   Wt 176 lb (79.8 kg)   BMI 23.22 kg/m    Physical Exam Vitals  signs reviewed.  Constitutional:      General: She is not in acute distress.    Appearance: Normal appearance. She is normal weight. She is not ill-appearing, toxic-appearing or diaphoretic.  HENT:     Head: Normocephalic and atraumatic.  Eyes:     General: No scleral icterus.       Right eye: No discharge.        Left eye: No discharge.     Conjunctiva/sclera: Conjunctivae normal.  Neck:     Musculoskeletal: Normal range of motion.  Cardiovascular:     Rate  and Rhythm: Normal rate and regular rhythm.     Heart sounds: Normal heart sounds. No murmur. No friction rub. No gallop.   Pulmonary:     Effort: Pulmonary effort is normal. No respiratory distress.     Breath sounds: Normal breath sounds. No stridor. No wheezing, rhonchi or rales.  Musculoskeletal: Normal range of motion.  Skin:    General: Skin is warm and dry.     Capillary Refill: Capillary refill takes less than 2 seconds.  Neurological:     General: No focal deficit present.     Mental Status: She is alert and oriented to person, place, and time. Mental status is at baseline.  Psychiatric:        Mood and Affect: Mood normal.        Behavior: Behavior normal.        Thought Content: Thought content normal.        Judgment: Judgment normal.     Lab Results  Component Value Date   TSH 6.780 (H) 01/10/2019   Lab Results  Component Value Date   WBC 7.0 01/10/2019   HGB 14.2 01/10/2019   HCT 43.3 01/10/2019   MCV 96 01/10/2019   PLT 286 04/07/2018   Lab Results  Component Value Date   NA 143 01/10/2019   K 4.1 01/10/2019   CO2 23 01/10/2019   GLUCOSE 94 01/10/2019   BUN 20 01/10/2019   CREATININE 1.56 (H) 01/10/2019   BILITOT 0.9 01/10/2019   ALKPHOS 71 01/10/2019   AST 20 01/10/2019   ALT 14 01/10/2019   PROT 6.3 01/10/2019   ALBUMIN 4.1 01/10/2019   CALCIUM 9.3 01/10/2019   ANIONGAP 10 03/19/2017   Lab Results  Component Value Date   CHOL 118 11/21/2013   Lab Results  Component Value Date    HDL 44 11/21/2013   Lab Results  Component Value Date   LDLCALC 54 11/21/2013   Lab Results  Component Value Date   TRIG 100 11/21/2013   Lab Results  Component Value Date   CHOLHDL 2.7 11/21/2013   Lab Results  Component Value Date   HGBA1C 5.9 (H) 11/20/2013

## 2019-03-05 DIAGNOSIS — Z8673 Personal history of transient ischemic attack (TIA), and cerebral infarction without residual deficits: Secondary | ICD-10-CM | POA: Diagnosis not present

## 2019-03-05 DIAGNOSIS — Z6826 Body mass index (BMI) 26.0-26.9, adult: Secondary | ICD-10-CM | POA: Diagnosis not present

## 2019-03-05 DIAGNOSIS — Z7901 Long term (current) use of anticoagulants: Secondary | ICD-10-CM | POA: Diagnosis not present

## 2019-03-05 DIAGNOSIS — I110 Hypertensive heart disease with heart failure: Secondary | ICD-10-CM | POA: Diagnosis not present

## 2019-03-05 DIAGNOSIS — I509 Heart failure, unspecified: Secondary | ICD-10-CM | POA: Diagnosis not present

## 2019-03-05 DIAGNOSIS — I4891 Unspecified atrial fibrillation: Secondary | ICD-10-CM | POA: Diagnosis not present

## 2019-03-05 DIAGNOSIS — R609 Edema, unspecified: Secondary | ICD-10-CM | POA: Diagnosis not present

## 2019-03-05 DIAGNOSIS — E785 Hyperlipidemia, unspecified: Secondary | ICD-10-CM | POA: Diagnosis not present

## 2019-03-05 DIAGNOSIS — F0390 Unspecified dementia without behavioral disturbance: Secondary | ICD-10-CM | POA: Diagnosis not present

## 2019-03-14 NOTE — Progress Notes (Signed)
Assessment & Plan:  1-2. Chronic atrial fibrillation/Warfarin anticoagulation Description   Continue current coumadin dose of 5 mg (1 tablet) on Sunday, Tuesday, and Thursday with 2.5 mg (0.5 tablet) on Monday, Wednesday, Friday, and Saturday.   INR today 2.8   - CoaguChek XS/INR Waived   Follow up plan: Return in about 4 weeks (around 04/14/2019) for follow-up of chronic medication conditions with INR.  Deliah Boston, MSN, APRN, FNP-C [facility]  Subjective:   Patient ID: Wanda Mckenzie, female    DOB: December 05, 1935, 83 y.o.   MRN: [id number]  HPI: Wanda Mckenzie is a 83 y.o. female presenting on 03/17/2019 for Anticoagulation  Anticoagulation: Patient here for anticoagulation monitoring. Indication: atrial fibrillation Bleeding Signs/Symptoms:  None Thromboembolic Signs/Symptoms:  None  Missed Coumadin Doses:  None Medication Changes:  no Dietary Changes:  no Bacterial/Viral Infection:  no  Other Concerns:  no   ROS: Negative unless specifically indicated above in HPI.   Relevant past medical history reviewed and updated as indicated.   Allergies and medications reviewed and updated.   Current Outpatient Medications:  .  albuterol (VENTOLIN HFA) 108 (90 Base) MCG/ACT inhaler, Inhale 2 puffs into the lungs every 6 (six) hours as needed for wheezing or shortness of breath., Disp: 18 g, Rfl: 2 .  atorvastatin (LIPITOR) 20 MG tablet, Take 1 tablet (20 mg total) by mouth daily., Disp: 90 tablet, Rfl: 3 .  furosemide (LASIX) 40 MG tablet, Take 1 tablet (40 mg total) by mouth 2 (two) times daily., Disp: 60 tablet, Rfl: 5 .  losartan (COZAAR) 100 MG tablet, Take 1 tablet (100 mg total) by mouth daily., Disp: 30 tablet, Rfl: 5 .  Memantine HCl-Donepezil HCl (NAMZARIC) 28-10 MG CP24, Take 1 capsule by mouth at bedtime., Disp: 30 capsule, Rfl: 5 .  metoprolol tartrate (LOPRESSOR) 25 MG tablet, Take 0.5 tablets (12.5 mg total) by mouth 2 (two) times  daily., Disp: 30 tablet, Rfl: 5 .  potassium chloride SA (K-DUR) 20 MEQ tablet, Take 1 tablet (20 mEq total) by mouth 2 (two) times daily., Disp: 60 tablet, Rfl: 5 .  Spacer/Aero-Holding Chambers (AEROCHAMBER PLUS) inhaler, Use as instructed, Disp: 1 each, Rfl: 2 .  warfarin (COUMADIN) 5 MG tablet, Takes 5 mg by mouth on Sunday, Tuesday and Thursday and 2.5 mg by mouth on Monday, Wednesday, Friday, and Saturday., Disp: 20 tablet, Rfl: 5  Allergies  Allergen Reactions  . Codeine     REACTION: vomiting    Objective:   BP (!) 156/74   Pulse 74   Temp (!) 96.9 F (36.1 C) (Temporal)   Ht 6\' 1"  (1.854 m)   Wt 177 lb 12.8 oz (80.6 kg)   SpO2 96%   BMI 23.46 kg/m    Physical Exam Vitals signs reviewed.  Constitutional:      General: She is not in acute distress.    Appearance: Normal appearance. She is normal weight. She is not ill-appearing, toxic-appearing or diaphoretic.  HENT:     Head: Normocephalic and atraumatic.  Eyes:     General: No scleral icterus.       Right eye: No discharge.        Left eye: No discharge.     Conjunctiva/sclera: Conjunctivae normal.  Neck:     Musculoskeletal: Normal range of motion.  Cardiovascular:     Rate and Rhythm: Normal rate. Rhythm regularly irregular.     Heart sounds: Normal heart sounds. No murmur. No friction rub. No gallop.  Pulmonary:     Effort: Pulmonary effort is normal. No respiratory distress.     Breath sounds: Normal breath sounds. No stridor. No wheezing, rhonchi or rales.  Musculoskeletal: Normal range of motion.  Skin:    General: Skin is warm and dry.     Capillary Refill: Capillary refill takes less than 2 seconds.  Neurological:     General: No focal deficit present.     Mental Status: She is alert and oriented to person, place, and time. Mental status is at baseline.  Psychiatric:        Mood and Affect: Mood normal.        Behavior: Behavior normal.        Thought Content: Thought content normal.         Judgment: Judgment normal.

## 2019-03-17 ENCOUNTER — Ambulatory Visit (INDEPENDENT_AMBULATORY_CARE_PROVIDER_SITE_OTHER): Payer: Medicare HMO | Admitting: Family Medicine

## 2019-03-17 ENCOUNTER — Encounter: Payer: Self-pay | Admitting: Family Medicine

## 2019-03-17 ENCOUNTER — Other Ambulatory Visit: Payer: Self-pay

## 2019-03-17 VITALS — BP 156/74 | HR 74 | Temp 96.9°F | Ht 73.0 in | Wt 177.8 lb

## 2019-03-17 DIAGNOSIS — Z7901 Long term (current) use of anticoagulants: Secondary | ICD-10-CM | POA: Diagnosis not present

## 2019-03-17 DIAGNOSIS — I482 Chronic atrial fibrillation, unspecified: Secondary | ICD-10-CM

## 2019-03-17 LAB — COAGUCHEK XS/INR WAIVED
INR: 2.8 — ABNORMAL HIGH (ref 0.9–1.1)
Prothrombin Time: 34.1 s

## 2019-04-13 DIAGNOSIS — R7989 Other specified abnormal findings of blood chemistry: Secondary | ICD-10-CM | POA: Insufficient documentation

## 2019-04-13 NOTE — Progress Notes (Signed)
Assessment & Plan:  1-2. Chronic atrial fibrillation (HCC)/Warfarin anticoagulation Description   Hold coumadin x3 days. Then resume coumadin dose of 5 mg (1 tablet) on Sunday, Tuesday, and Thursday with 2.5 mg (0.5 tablet) on Monday, Wednesday, Friday, and Saturday.   INR today: 5.4  Re-check INR on 04/26/2019   - CMP14+EGFR - CoaguChek XS/INR Waived - Patients INR has been stable on current dosage since becoming my patient until today. Will decrease dosage if high at next INR.  3. Blister of neck, initial encounter - Encouraged care giver not to burst blisters but leave them for protection. If they pop, cleanse area with mild soap and water daily.   4. Chronic congestive heart failure, unspecified heart failure type ([place]) - Well controlled on current regimen.   5. Essential hypertension - Well controlled on current regimen.  - CMP14+EGFR  6. Chronic obstructive pulmonary disease, unspecified COPD type ([place]) - Well controlled on current regimen.   7. Mixed hyperlipidemia - CMP14+EGFR  8. Elevated TSH - TSH  9. Need for immunization against influenza - Flu Vaccine QUAD High Dose(Fluad)   Return in about 12 days (around 04/26/2019) for INR.  Hendricks Limes, MSN, APRN, FNP-C [facility]  Subjective:    Patient ID: Wanda Mckenzie, female    DOB: 03/08/1936, 83 y.o.   MRN: [id number]  Patient Care Team: Loman Brooklyn, FNP as PCP - General (Family Medicine)   Chief Complaint:  Chief Complaint  Patient presents with  . Anticoagulation    HPI: Wanda Mckenzie is a 83 y.o. female presenting on 04/14/2019 for Anticoagulation  Anticoagulation: Patient here for anticoagulation monitoring. Indication: atrial fibrillation Bleeding Signs/Symptoms:  None Thromboembolic Signs/Symptoms:  None  Missed Coumadin Doses:  None Medication Changes:  no Dietary Changes:  no Bacterial/Viral Infection:  no   Patient denies shortness of breath or  weight gain that may be related to CHF.  BP well controlled on current regimen.   No use of Albuterol inhaler.    New complaints: Patient has three blisters on the right side of her neck that appeared after she "slept wrong". Denies itching or pain.    Relevant past medical, surgical, family and social history reviewed and updated as indicated. Interim medical history since our last visit reviewed.  Allergies and medications reviewed and updated.  DATA REVIEWED: CHART IN EPIC  ROS: Negative unless specifically indicated above in HPI.    Current Outpatient Medications:  .  albuterol (VENTOLIN HFA) 108 (90 Base) MCG/ACT inhaler, Inhale 2 puffs into the lungs every 6 (six) hours as needed for wheezing or shortness of breath., Disp: 18 g, Rfl: 2 .  atorvastatin (LIPITOR) 20 MG tablet, Take 1 tablet (20 mg total) by mouth daily., Disp: 90 tablet, Rfl: 3 .  furosemide (LASIX) 40 MG tablet, Take 1 tablet (40 mg total) by mouth 2 (two) times daily., Disp: 60 tablet, Rfl: 5 .  losartan (COZAAR) 100 MG tablet, Take 1 tablet (100 mg total) by mouth daily., Disp: 30 tablet, Rfl: 5 .  Memantine HCl-Donepezil HCl (NAMZARIC) 28-10 MG CP24, Take 1 capsule by mouth at bedtime., Disp: 30 capsule, Rfl: 5 .  metoprolol tartrate (LOPRESSOR) 25 MG tablet, Take 0.5 tablets (12.5 mg total) by mouth 2 (two) times daily., Disp: 30 tablet, Rfl: 5 .  potassium chloride SA (K-DUR) 20 MEQ tablet, Take 1 tablet (20 mEq total) by mouth 2 (two) times daily., Disp: 60 tablet, Rfl: 5 .  Spacer/Aero-Holding Chambers (AEROCHAMBER PLUS)  inhaler, Use as instructed, Disp: 1 each, Rfl: 2 .  warfarin (COUMADIN) 5 MG tablet, Takes 5 mg by mouth on Sunday, Tuesday and Thursday and 2.5 mg by mouth on Monday, Wednesday, Friday, and Saturday., Disp: 20 tablet, Rfl: 5   Allergies  Allergen Reactions  . Codeine     REACTION: vomiting   Past Medical History:  Diagnosis Date  . Atrial fibrillation ([place])   . COPD (chronic  obstructive pulmonary disease) ([place])   . Dyslipidemia   . Essential hypertension   . History of cardiac catheterization 11/2013   Normal coronaries  . History of stroke    Right MCA distribution     Past Surgical History:  Procedure Laterality Date  . ABDOMINAL HYSTERECTOMY  1975  . FRACTURE SURGERY Left   . LEFT HEART CATHETERIZATION WITH CORONARY ANGIOGRAM N/A 11/23/2013   Procedure: LEFT HEART CATHETERIZATION WITH CORONARY ANGIOGRAM;  Surgeon: Burnell Blanks, MD;  Location: [facility] CATH LAB;  Service: Cardiovascular;  Laterality: N/A;  . LEFT HIP HEMIARTHROPLASTY    . TRIBUTARY VARICOSITIES OF RIGHT LEG  05/23/2002  . VEIN LIGATION AND STRIPPING Right 02/08/2013   Procedure: Segmental excision of painful varicose veins right lower extremity;  Surgeon: Angelia Mould, MD;  Location: [facility] OR;  Service: Vascular;  Laterality: Right;    Social History   Socioeconomic History  . Marital status: Married    Spouse name: Not on file  . Number of children: Not on file  . Years of education: Not on file  . Highest education level: Not on file  Occupational History  . Not on file  Social Needs  . Financial resource strain: Not on file  . Food insecurity    Worry: Not on file    Inability: Not on file  . Transportation needs    Medical: Not on file    Non-medical: Not on file  Tobacco Use  . Smoking status: Never Smoker  . Smokeless tobacco: Never Used  Substance and Sexual Activity  . Alcohol use: No    Alcohol/week: 0.0 standard drinks  . Drug use: No  . Sexual activity: Not Currently  Lifestyle  . Physical activity    Days per week: Not on file    Minutes per session: Not on file  . Stress: Not on file  Relationships  . Social Herbalist on phone: Not on file    Gets together: Not on file    Attends religious service: Not on file    Active member of club or organization: Not on file    Attends meetings of clubs or organizations: Not on file     Relationship status: Not on file  . Intimate partner violence    Fear of current or ex partner: Not on file    Emotionally abused: Not on file    Physically abused: Not on file    Forced sexual activity: Not on file  Other Topics Concern  . Not on file  Social History Narrative  . Not on file        Objective:    BP 135/70   Pulse 62   Temp (!) 96.9 F (36.1 C) (Temporal)   Ht '5\' 11"'  (1.803 m)   Wt 179 lb (81.2 kg)   SpO2 96%   BMI 24.97 kg/m   Physical Exam Vitals signs reviewed.  Constitutional:      General: She is not in acute distress.    Appearance: Normal appearance. She is  normal weight. She is not ill-appearing, toxic-appearing or diaphoretic.  HENT:     Head: Normocephalic and atraumatic.  Eyes:     General: No scleral icterus.       Right eye: No discharge.        Left eye: No discharge.     Conjunctiva/sclera: Conjunctivae normal.  Neck:     Musculoskeletal: Normal range of motion.  Cardiovascular:     Rate and Rhythm: Normal rate and regular rhythm.     Heart sounds: Normal heart sounds. No murmur. No friction rub. No gallop.   Pulmonary:     Effort: Pulmonary effort is normal. No respiratory distress.     Breath sounds: Normal breath sounds. No stridor. No wheezing, rhonchi or rales.  Musculoskeletal: Normal range of motion.  Skin:    General: Skin is warm and dry.     Capillary Refill: Capillary refill takes less than 2 seconds.     Comments: Three blisters on the right side of patient's neck.   Neurological:     General: No focal deficit present.     Mental Status: She is alert and oriented to person, place, and time. Mental status is at baseline.  Psychiatric:        Mood and Affect: Mood normal.        Behavior: Behavior normal.        Thought Content: Thought content normal.        Judgment: Judgment normal.    Lab Results  Component Value Date   TSH 6.780 (H) 01/10/2019   Lab Results  Component Value Date   WBC 7.0 01/10/2019    HGB 14.2 01/10/2019   HCT 43.3 01/10/2019   MCV 96 01/10/2019   PLT 286 04/07/2018   Lab Results  Component Value Date   NA 143 01/10/2019   K 4.1 01/10/2019   CO2 23 01/10/2019   GLUCOSE 94 01/10/2019   BUN 20 01/10/2019   CREATININE 1.56 (H) 01/10/2019   BILITOT 0.9 01/10/2019   ALKPHOS 71 01/10/2019   AST 20 01/10/2019   ALT 14 01/10/2019   PROT 6.3 01/10/2019   ALBUMIN 4.1 01/10/2019   CALCIUM 9.3 01/10/2019   ANIONGAP 10 03/19/2017   Lab Results  Component Value Date   CHOL 118 11/21/2013   Lab Results  Component Value Date   HDL 44 11/21/2013   Lab Results  Component Value Date   LDLCALC 54 11/21/2013   Lab Results  Component Value Date   TRIG 100 11/21/2013   Lab Results  Component Value Date   CHOLHDL 2.7 11/21/2013   Lab Results  Component Value Date   HGBA1C 5.9 (H) 11/20/2013

## 2019-04-14 ENCOUNTER — Ambulatory Visit (INDEPENDENT_AMBULATORY_CARE_PROVIDER_SITE_OTHER): Payer: Medicare HMO | Admitting: Family Medicine

## 2019-04-14 ENCOUNTER — Encounter: Payer: Self-pay | Admitting: Family Medicine

## 2019-04-14 ENCOUNTER — Other Ambulatory Visit: Payer: Self-pay

## 2019-04-14 VITALS — BP 135/70 | HR 62 | Temp 96.9°F | Ht 71.0 in | Wt 179.0 lb

## 2019-04-14 DIAGNOSIS — J449 Chronic obstructive pulmonary disease, unspecified: Secondary | ICD-10-CM | POA: Diagnosis not present

## 2019-04-14 DIAGNOSIS — I509 Heart failure, unspecified: Secondary | ICD-10-CM | POA: Diagnosis not present

## 2019-04-14 DIAGNOSIS — Z7901 Long term (current) use of anticoagulants: Secondary | ICD-10-CM | POA: Diagnosis not present

## 2019-04-14 DIAGNOSIS — I10 Essential (primary) hypertension: Secondary | ICD-10-CM

## 2019-04-14 DIAGNOSIS — Z23 Encounter for immunization: Secondary | ICD-10-CM

## 2019-04-14 DIAGNOSIS — R7989 Other specified abnormal findings of blood chemistry: Secondary | ICD-10-CM

## 2019-04-14 DIAGNOSIS — S1092XA Blister (nonthermal) of unspecified part of neck, initial encounter: Secondary | ICD-10-CM | POA: Diagnosis not present

## 2019-04-14 DIAGNOSIS — F0150 Vascular dementia without behavioral disturbance: Secondary | ICD-10-CM | POA: Diagnosis not present

## 2019-04-14 DIAGNOSIS — I482 Chronic atrial fibrillation, unspecified: Secondary | ICD-10-CM | POA: Diagnosis not present

## 2019-04-14 DIAGNOSIS — E782 Mixed hyperlipidemia: Secondary | ICD-10-CM | POA: Diagnosis not present

## 2019-04-14 LAB — COAGUCHEK XS/INR WAIVED
INR: 5.6 (ref 0.9–1.1)
Prothrombin Time: 66.8 s

## 2019-04-15 LAB — CMP14+EGFR
ALT: 18 IU/L (ref 0–32)
AST: 25 IU/L (ref 0–40)
Albumin/Globulin Ratio: 1.6 (ref 1.2–2.2)
Albumin: 4.1 g/dL (ref 3.7–4.7)
Alkaline Phosphatase: 105 IU/L (ref 39–117)
BUN/Creatinine Ratio: 17 (ref 12–28)
BUN: 19 mg/dL (ref 8–27)
Bilirubin Total: 0.6 mg/dL (ref 0.0–1.2)
CO2: 25 mmol/L (ref 20–29)
Calcium: 9 mg/dL (ref 8.7–10.3)
Chloride: 107 mmol/L — ABNORMAL HIGH (ref 96–106)
Creatinine, Ser: 1.11 mg/dL — ABNORMAL HIGH (ref 0.57–1.00)
GFR calc Af Amer: 54 mL/min/{1.73_m2} — ABNORMAL LOW (ref >59)
GFR calc non Af Amer: 47 mL/min/{1.73_m2} — ABNORMAL LOW (ref >59)
Globulin, Total: 2.5 g/dL (ref 1.5–4.5)
Glucose: 138 mg/dL — ABNORMAL HIGH (ref 65–99)
Potassium: 3.3 mmol/L — ABNORMAL LOW (ref 3.5–5.2)
Sodium: 149 mmol/L — ABNORMAL HIGH (ref 134–144)
Total Protein: 6.6 g/dL (ref 6.0–8.5)

## 2019-04-15 LAB — TSH: TSH: 6.72 u[IU]/mL — ABNORMAL HIGH (ref 0.450–4.500)

## 2019-04-19 NOTE — Progress Notes (Signed)
Spoke with Thayer Headings from [facility] - She stated their office was transitioning to a new provider since Dr. Lysle Morales. Retired - advised me to fax ref again and she would get patient scheduled. Ref was faxed to office just now.

## 2019-04-28 ENCOUNTER — Telehealth: Payer: Self-pay | Admitting: Family Medicine

## 2019-04-28 NOTE — Telephone Encounter (Signed)
Spoke with patient's husband.  She has had a runny nose for 2-3 months, he reports she has problems with allergies.  Patient given clearance to come to office tomorrow.

## 2019-04-29 ENCOUNTER — Ambulatory Visit (INDEPENDENT_AMBULATORY_CARE_PROVIDER_SITE_OTHER): Payer: Medicare HMO | Admitting: Family Medicine

## 2019-04-29 ENCOUNTER — Other Ambulatory Visit: Payer: Self-pay

## 2019-04-29 ENCOUNTER — Encounter: Payer: Self-pay | Admitting: Family Medicine

## 2019-04-29 DIAGNOSIS — I482 Chronic atrial fibrillation, unspecified: Secondary | ICD-10-CM

## 2019-04-29 DIAGNOSIS — Z7901 Long term (current) use of anticoagulants: Secondary | ICD-10-CM | POA: Diagnosis not present

## 2019-04-29 LAB — COAGUCHEK XS/INR WAIVED
INR: 2.1 — ABNORMAL HIGH (ref 0.9–1.1)
Prothrombin Time: 24.9 s

## 2019-04-29 NOTE — Progress Notes (Signed)
Assessment & Plan:  1-2. Chronic atrial fibrillation (HCC)/Warfarin anticoagulation Description   Continue coumadin dose of 5 mg (1 tablet) on Sunday, Tuesday, and Thursday with 2.5 mg (0.5 tablet) on Monday, Wednesday, Friday, and Saturday.   INR today: 2.1  Re-check INR in 4 weeks.     - CoaguChek XS/INR Waived   Follow up plan: Return in about 4 weeks (around 05/27/2019) for INR.  Hendricks Limes, MSN, APRN, FNP-C [facility]  Subjective:   Patient ID: Wanda Mckenzie, female    DOB: 1936-03-04, 83 y.o.   MRN: [id number]  HPI: Wanda Mckenzie is a 83 y.o. female presenting on 04/29/2019 for protime recheck  Anticoagulation: Patient here for anticoagulation monitoring. Indication: atrial fibrillation Bleeding Signs/Symptoms:  None Thromboembolic Signs/Symptoms:  None  Missed Coumadin Doses:  Patient held x3 days as ordered and then resumed; has been taking daily for the past 13 days Medication Changes:  no Dietary Changes:  no Bacterial/Viral Infection:  no  Other Concerns:  no   ROS: Negative unless specifically indicated above in HPI.   Relevant past medical history reviewed and updated as indicated.   Allergies and medications reviewed and updated.   Current Outpatient Medications:  .  albuterol (VENTOLIN HFA) 108 (90 Base) MCG/ACT inhaler, Inhale 2 puffs into the lungs every 6 (six) hours as needed for wheezing or shortness of breath., Disp: 18 g, Rfl: 2 .  atorvastatin (LIPITOR) 20 MG tablet, Take 1 tablet (20 mg total) by mouth daily., Disp: 90 tablet, Rfl: 3 .  furosemide (LASIX) 40 MG tablet, Take 1 tablet (40 mg total) by mouth 2 (two) times daily., Disp: 60 tablet, Rfl: 5 .  losartan (COZAAR) 100 MG tablet, Take 1 tablet (100 mg total) by mouth daily., Disp: 30 tablet, Rfl: 5 .  Memantine HCl-Donepezil HCl (NAMZARIC) 28-10 MG CP24, Take 1 capsule by mouth at bedtime., Disp: 30 capsule, Rfl: 5 .  metoprolol tartrate (LOPRESSOR) 25 MG  tablet, Take 0.5 tablets (12.5 mg total) by mouth 2 (two) times daily., Disp: 30 tablet, Rfl: 5 .  potassium chloride SA (K-DUR) 20 MEQ tablet, Take 1 tablet (20 mEq total) by mouth 2 (two) times daily., Disp: 60 tablet, Rfl: 5 .  Spacer/Aero-Holding Chambers (AEROCHAMBER PLUS) inhaler, Use as instructed, Disp: 1 each, Rfl: 2 .  warfarin (COUMADIN) 5 MG tablet, Takes 5 mg by mouth on Sunday, Tuesday and Thursday and 2.5 mg by mouth on Monday, Wednesday, Friday, and Saturday., Disp: 20 tablet, Rfl: 5  Allergies  Allergen Reactions  . Codeine     REACTION: vomiting    Objective:   BP (!) 143/79   Pulse 74   Temp (!) 96.6 F (35.9 C) (Temporal)   Ht 5\' 11"  (1.803 m)   Wt 175 lb 3.2 oz (79.5 kg)   SpO2 94%   BMI 24.44 kg/m    Physical Exam Vitals signs reviewed.  Constitutional:      General: She is not in acute distress.    Appearance: Normal appearance. She is normal weight. She is not ill-appearing, toxic-appearing or diaphoretic.  HENT:     Head: Normocephalic and atraumatic.  Eyes:     General: No scleral icterus.       Right eye: No discharge.        Left eye: No discharge.     Conjunctiva/sclera: Conjunctivae normal.  Neck:     Musculoskeletal: Normal range of motion.  Cardiovascular:     Rate and Rhythm: Normal  rate and regular rhythm.     Heart sounds: Normal heart sounds. No murmur. No friction rub. No gallop.   Pulmonary:     Effort: Pulmonary effort is normal. No respiratory distress.     Breath sounds: Normal breath sounds. No stridor. No wheezing, rhonchi or rales.  Musculoskeletal: Normal range of motion.  Skin:    General: Skin is warm and dry.     Capillary Refill: Capillary refill takes less than 2 seconds.  Neurological:     General: No focal deficit present.     Mental Status: She is alert and oriented to person, place, and time. Mental status is at baseline.  Psychiatric:        Mood and Affect: Mood normal.        Behavior: Behavior normal.         Thought Content: Thought content normal.        Judgment: Judgment normal.

## 2019-04-30 ENCOUNTER — Encounter: Payer: Self-pay | Admitting: Family Medicine

## 2019-05-14 ENCOUNTER — Observation Stay (HOSPITAL_COMMUNITY): Payer: Medicare HMO

## 2019-05-14 ENCOUNTER — Emergency Department (HOSPITAL_BASED_OUTPATIENT_CLINIC_OR_DEPARTMENT_OTHER): Payer: Medicare HMO

## 2019-05-14 ENCOUNTER — Inpatient Hospital Stay (HOSPITAL_COMMUNITY)
Admission: EM | Admit: 2019-05-14 | Discharge: 2019-05-26 | DRG: 071 | Disposition: A | Discharge status: Skilled Nursing Facility | Payer: Medicare HMO | Attending: Family Medicine | Admitting: Family Medicine

## 2019-05-14 ENCOUNTER — Other Ambulatory Visit: Payer: Self-pay

## 2019-05-14 ENCOUNTER — Encounter (HOSPITAL_COMMUNITY): Payer: Self-pay | Admitting: *Deleted

## 2019-05-14 ENCOUNTER — Emergency Department (HOSPITAL_COMMUNITY): Payer: Medicare HMO

## 2019-05-14 DIAGNOSIS — R52 Pain, unspecified: Secondary | ICD-10-CM

## 2019-05-14 DIAGNOSIS — Z885 Allergy status to narcotic agent status: Secondary | ICD-10-CM | POA: Diagnosis not present

## 2019-05-14 DIAGNOSIS — I361 Nonrheumatic tricuspid (valve) insufficiency: Secondary | ICD-10-CM | POA: Diagnosis not present

## 2019-05-14 DIAGNOSIS — I130 Hypertensive heart and chronic kidney disease with heart failure and stage 1 through stage 4 chronic kidney disease, or unspecified chronic kidney disease: Secondary | ICD-10-CM | POA: Diagnosis present

## 2019-05-14 DIAGNOSIS — R404 Transient alteration of awareness: Secondary | ICD-10-CM | POA: Diagnosis not present

## 2019-05-14 DIAGNOSIS — Z20828 Contact with and (suspected) exposure to other viral communicable diseases: Secondary | ICD-10-CM | POA: Diagnosis present

## 2019-05-14 DIAGNOSIS — I82402 Acute embolism and thrombosis of unspecified deep veins of left lower extremity: Secondary | ICD-10-CM | POA: Diagnosis not present

## 2019-05-14 DIAGNOSIS — E782 Mixed hyperlipidemia: Secondary | ICD-10-CM | POA: Diagnosis present

## 2019-05-14 DIAGNOSIS — Z9114 Patient's other noncompliance with medication regimen: Secondary | ICD-10-CM

## 2019-05-14 DIAGNOSIS — F0150 Vascular dementia without behavioral disturbance: Secondary | ICD-10-CM | POA: Diagnosis present

## 2019-05-14 DIAGNOSIS — I10 Essential (primary) hypertension: Secondary | ICD-10-CM | POA: Diagnosis not present

## 2019-05-14 DIAGNOSIS — I5022 Chronic systolic (congestive) heart failure: Secondary | ICD-10-CM | POA: Diagnosis present

## 2019-05-14 DIAGNOSIS — Z66 Do not resuscitate: Secondary | ICD-10-CM | POA: Diagnosis present

## 2019-05-14 DIAGNOSIS — I82442 Acute embolism and thrombosis of left tibial vein: Secondary | ICD-10-CM | POA: Diagnosis present

## 2019-05-14 DIAGNOSIS — M7989 Other specified soft tissue disorders: Secondary | ICD-10-CM

## 2019-05-14 DIAGNOSIS — N179 Acute kidney failure, unspecified: Secondary | ICD-10-CM | POA: Diagnosis present

## 2019-05-14 DIAGNOSIS — Z23 Encounter for immunization: Secondary | ICD-10-CM

## 2019-05-14 DIAGNOSIS — E785 Hyperlipidemia, unspecified: Secondary | ICD-10-CM | POA: Diagnosis present

## 2019-05-14 DIAGNOSIS — Z809 Family history of malignant neoplasm, unspecified: Secondary | ICD-10-CM | POA: Diagnosis not present

## 2019-05-14 DIAGNOSIS — R55 Syncope and collapse: Secondary | ICD-10-CM | POA: Diagnosis not present

## 2019-05-14 DIAGNOSIS — Z823 Family history of stroke: Secondary | ICD-10-CM | POA: Diagnosis not present

## 2019-05-14 DIAGNOSIS — I482 Chronic atrial fibrillation, unspecified: Secondary | ICD-10-CM | POA: Diagnosis present

## 2019-05-14 DIAGNOSIS — G9341 Metabolic encephalopathy: Secondary | ICD-10-CM | POA: Diagnosis present

## 2019-05-14 DIAGNOSIS — I4891 Unspecified atrial fibrillation: Secondary | ICD-10-CM | POA: Diagnosis not present

## 2019-05-14 DIAGNOSIS — E039 Hypothyroidism, unspecified: Secondary | ICD-10-CM | POA: Diagnosis present

## 2019-05-14 DIAGNOSIS — F0390 Unspecified dementia without behavioral disturbance: Secondary | ICD-10-CM | POA: Diagnosis not present

## 2019-05-14 DIAGNOSIS — R4182 Altered mental status, unspecified: Secondary | ICD-10-CM | POA: Diagnosis present

## 2019-05-14 DIAGNOSIS — R4189 Other symptoms and signs involving cognitive functions and awareness: Secondary | ICD-10-CM | POA: Diagnosis present

## 2019-05-14 DIAGNOSIS — Z8249 Family history of ischemic heart disease and other diseases of the circulatory system: Secondary | ICD-10-CM | POA: Diagnosis not present

## 2019-05-14 DIAGNOSIS — Z7901 Long term (current) use of anticoagulants: Secondary | ICD-10-CM | POA: Diagnosis not present

## 2019-05-14 DIAGNOSIS — G459 Transient cerebral ischemic attack, unspecified: Secondary | ICD-10-CM

## 2019-05-14 DIAGNOSIS — R4020 Unspecified coma: Secondary | ICD-10-CM | POA: Diagnosis not present

## 2019-05-14 DIAGNOSIS — R918 Other nonspecific abnormal finding of lung field: Secondary | ICD-10-CM | POA: Diagnosis not present

## 2019-05-14 DIAGNOSIS — J449 Chronic obstructive pulmonary disease, unspecified: Secondary | ICD-10-CM | POA: Diagnosis present

## 2019-05-14 DIAGNOSIS — N1832 Chronic kidney disease, stage 3b: Secondary | ICD-10-CM | POA: Diagnosis present

## 2019-05-14 DIAGNOSIS — I351 Nonrheumatic aortic (valve) insufficiency: Secondary | ICD-10-CM

## 2019-05-14 DIAGNOSIS — E876 Hypokalemia: Secondary | ICD-10-CM | POA: Diagnosis not present

## 2019-05-14 DIAGNOSIS — Z8673 Personal history of transient ischemic attack (TIA), and cerebral infarction without residual deficits: Secondary | ICD-10-CM | POA: Diagnosis not present

## 2019-05-14 DIAGNOSIS — I509 Heart failure, unspecified: Secondary | ICD-10-CM

## 2019-05-14 LAB — CBG MONITORING, ED: Glucose-Capillary: 130 mg/dL — ABNORMAL HIGH (ref 70–99)

## 2019-05-14 LAB — COMPREHENSIVE METABOLIC PANEL
ALT: 25 U/L (ref 0–44)
AST: 33 U/L (ref 15–41)
Albumin: 4 g/dL (ref 3.5–5.0)
Alkaline Phosphatase: 93 U/L (ref 38–126)
Anion gap: 15 (ref 5–15)
BUN: 22 mg/dL (ref 8–23)
CO2: 23 mmol/L (ref 22–32)
Calcium: 9.3 mg/dL (ref 8.9–10.3)
Chloride: 104 mmol/L (ref 98–111)
Creatinine, Ser: 1.4 mg/dL — ABNORMAL HIGH (ref 0.44–1.00)
GFR calc Af Amer: 41 mL/min — ABNORMAL LOW (ref >60)
GFR calc non Af Amer: 35 mL/min — ABNORMAL LOW (ref >60)
Glucose, Bld: 134 mg/dL — ABNORMAL HIGH (ref 70–99)
Potassium: 3.8 mmol/L (ref 3.5–5.1)
Sodium: 142 mmol/L (ref 135–145)
Total Bilirubin: 1.7 mg/dL — ABNORMAL HIGH (ref 0.3–1.2)
Total Protein: 6.9 g/dL (ref 6.5–8.1)

## 2019-05-14 LAB — URINALYSIS, ROUTINE W REFLEX MICROSCOPIC
Bilirubin Urine: NEGATIVE
Glucose, UA: NEGATIVE mg/dL
Ketones, ur: NEGATIVE mg/dL
Leukocytes,Ua: NEGATIVE
Nitrite: NEGATIVE
Protein, ur: NEGATIVE mg/dL
Specific Gravity, Urine: 1.014 (ref 1.005–1.030)
pH: 5 (ref 5.0–8.0)

## 2019-05-14 LAB — CBC
HCT: 47.9 % — ABNORMAL HIGH (ref 36.0–46.0)
Hemoglobin: 16 g/dL — ABNORMAL HIGH (ref 12.0–15.0)
MCH: 31.9 pg (ref 26.0–34.0)
MCHC: 33.4 g/dL (ref 30.0–36.0)
MCV: 95.4 fL (ref 80.0–100.0)
Platelets: 252 10*3/uL (ref 150–400)
RBC: 5.02 MIL/uL (ref 3.87–5.11)
RDW: 12.5 % (ref 11.5–15.5)
WBC: 11.6 10*3/uL — ABNORMAL HIGH (ref 4.0–10.5)
nRBC: 0 % (ref 0.0–0.2)

## 2019-05-14 LAB — SARS CORONAVIRUS 2 (TAT 6-24 HRS): SARS Coronavirus 2: NEGATIVE

## 2019-05-14 LAB — PROTIME-INR
INR: 1.2 (ref 0.8–1.2)
Prothrombin Time: 14.7 seconds (ref 11.4–15.2)

## 2019-05-14 MED ORDER — DONEPEZIL HCL 10 MG PO TABS
10.0000 mg | ORAL_TABLET | Freq: Every day | ORAL | Status: DC
  Administered 2019-05-14 – 2019-05-25 (×12): 10 mg via ORAL
  Filled 2019-05-14 (×12): qty 1

## 2019-05-14 MED ORDER — ACETAMINOPHEN 160 MG/5ML PO SOLN: 650.0000 mg | ORAL | Status: DC | PRN

## 2019-05-14 MED ORDER — HEPARIN (PORCINE) 25000 UT/250ML-% IV SOLN
850.0000 [IU]/h | INTRAVENOUS | Status: DC
  Administered 2019-05-14 – 2019-05-15 (×2): 950 [IU]/h via INTRAVENOUS
  Filled 2019-05-14 (×2): qty 250

## 2019-05-14 MED ORDER — MEMANTINE HCL-DONEPEZIL HCL ER 28-10 MG PO CP24: 1.0000 | ORAL_CAPSULE | Freq: Every day | ORAL | Status: DC

## 2019-05-14 MED ORDER — ATORVASTATIN CALCIUM 10 MG PO TABS
20.0000 mg | ORAL_TABLET | Freq: Every day | ORAL | Status: DC
  Administered 2019-05-15 – 2019-05-26 (×12): 20 mg via ORAL
  Filled 2019-05-14 (×12): qty 2

## 2019-05-14 MED ORDER — ASPIRIN 325 MG PO TABS
325.0000 mg | ORAL_TABLET | Freq: Every day | ORAL | Status: DC
  Administered 2019-05-14 – 2019-05-15 (×2): 325 mg via ORAL
  Filled 2019-05-14 (×2): qty 1

## 2019-05-14 MED ORDER — ACETAMINOPHEN 325 MG PO TABS
650.0000 mg | ORAL_TABLET | ORAL | Status: DC | PRN
  Administered 2019-05-17: 650 mg via ORAL
  Filled 2019-05-14 (×2): qty 2

## 2019-05-14 MED ORDER — LEVOTHYROXINE SODIUM 25 MCG PO TABS
12.5000 ug | ORAL_TABLET | Freq: Every day | ORAL | Status: DC
  Administered 2019-05-14 – 2019-05-26 (×11): 12.5 ug via ORAL
  Filled 2019-05-14 (×13): qty 1

## 2019-05-14 MED ORDER — SENNOSIDES-DOCUSATE SODIUM 8.6-50 MG PO TABS: 1.0000 | ORAL_TABLET | Freq: Every evening | ORAL | Status: DC | PRN

## 2019-05-14 MED ORDER — STROKE: EARLY STAGES OF RECOVERY BOOK
Freq: Once | Status: AC
  Administered 2019-05-14: 19:00:00
  Filled 2019-05-14: qty 1

## 2019-05-14 MED ORDER — ACETAMINOPHEN 650 MG RE SUPP: 650.0000 mg | RECTAL | Status: DC | PRN

## 2019-05-14 MED ORDER — MEMANTINE HCL ER 28 MG PO CP24
28.0000 mg | ORAL_CAPSULE | Freq: Every day | ORAL | Status: DC
  Administered 2019-05-14 – 2019-05-25 (×12): 28 mg via ORAL
  Filled 2019-05-14 (×13): qty 1

## 2019-05-14 MED ORDER — SODIUM CHLORIDE 0.9 % IV SOLN
INTRAVENOUS | Status: DC
  Administered 2019-05-14 – 2019-05-16 (×3): via INTRAVENOUS

## 2019-05-14 MED ORDER — ASPIRIN 300 MG RE SUPP: 300.0000 mg | Freq: Every day | RECTAL | Status: DC

## 2019-05-14 MED ORDER — ALBUTEROL SULFATE (2.5 MG/3ML) 0.083% IN NEBU: 3.0000 mL | INHALATION_SOLUTION | Freq: Four times a day (QID) | RESPIRATORY_TRACT | Status: DC | PRN

## 2019-05-14 MED ORDER — SODIUM CHLORIDE 0.9% FLUSH: 3.0000 mL | Freq: Once | INTRAVENOUS | Status: DC

## 2019-05-14 NOTE — ED Notes (Signed)
Patient cleaned and repositioned in bed.

## 2019-05-14 NOTE — Progress Notes (Addendum)
[place] for heparin Indication: Hx atrial fibrillation, acute DVT, possible acute CVA  Heparin Dosing Weight: 75 kg  Labs: Recent Labs    05/14/19 1230  HGB 16.0*  HCT 47.9*  PLT 252  LABPROT 14.7  INR 1.2  CREATININE 1.40*    Assessment: 27 yof on warfarin PTA for afib presenting with AMS, possible stroke, found to have acute LLE DVT. Pharmacy consulted to dose heparin. INR subtherapeutic on admit at 1.2, Hg 16, plt 252. CT head negative for bleed. No other active bleed issues documented.  Per discussion with Neurology, will dose heparin for lower goal with no boluses per acute CVA protocol with MRI still pending.  Goal of Therapy:  Heparin level 0.3-0.5 units/ml, no boluses Monitor platelets by anticoagulation protocol: Yes   Plan:  No bolus. Start heparin at 950 units/hr 8h heparin level Monitor daily heparin level and CBC, s/sx bleeding   Elicia Lamp, PharmD, BCPS Clinical Pharmacist 05/14/2019 4:59 PM

## 2019-05-14 NOTE — ED Notes (Signed)
All patient belongings sent home with husband.

## 2019-05-14 NOTE — ED Triage Notes (Signed)
Patient presents to ed via [facility] ems states patient was sitting at the table and slumped over incont of bowel. Patient was lowered to the floor by caretaker ems states call came out as CPR however when ems arrived patient was breathing and had pulses. Caretaker states patient was fine this am when she got up was able to shower, hx. Dementia and has caretakers 24/7

## 2019-05-14 NOTE — ED Provider Notes (Addendum)
[facility] Provider Note   CSN: [id number] Arrival date & time: 05/14/19  1216     History   Chief Complaint Chief Complaint  Patient presents with   Altered Mental Status    HPI Wanda Mckenzie is a 83 y.o. female presenting for evaluation of altered mental status.  Level 5 caveat due to dementia.  History obtained from triage note and EMS.  Patient woke up and was acting normal this morning.  Been was away, caregiver was in the house.  Caregiver found patient slumped over and laid her on the floor.  When EMS arrived, patient was breathing and alert.  Patient was continent of bowel and bladder.   Patient denying pain at this time.  Has no complaints.  Additional history obtained from chart review.  Patient with a history of A. fib on warfarin, COPD, hypertension, previous stroke.     HPI  Past Medical History:  Diagnosis Date   Atrial fibrillation (HCC)    COPD (chronic obstructive pulmonary disease) (HCC)    Dyslipidemia    Essential hypertension    History of cardiac catheterization 11/2013   Normal coronaries   History of stroke    Right MCA distribution     Patient Active Problem List   Diagnosis Date Noted   Elevated TSH 04/13/2019   Chronic congestive heart failure (HCC) 01/10/2019   Late effects of CVA (cerebrovascular accident) 04/06/2018   Hyperlipidemia 06/19/2016   Vascular dementia without behavioral disturbance (HCC) 06/08/2014   Varicose veins of lower extremities with other complications 02/02/2013   Essential hypertension    Aortic insufficiency    COPD (chronic obstructive pulmonary disease) (HCC)    Chronic atrial fibrillation (HCC)    Carotid bruit    Warfarin anticoagulation     Past Surgical History:  Procedure Laterality Date   ABDOMINAL HYSTERECTOMY  1975   FRACTURE SURGERY Left    LEFT HEART CATHETERIZATION WITH CORONARY ANGIOGRAM N/A 11/23/2013   Procedure: LEFT HEART  CATHETERIZATION WITH CORONARY ANGIOGRAM;  Surgeon: Kathleene Hazelhristopher D McAlhany, MD;  [facility];  Service: Cardiovascular;  Laterality: N/A;   LEFT HIP HEMIARTHROPLASTY     TRIBUTARY VARICOSITIES OF RIGHT LEG  05/23/2002   VEIN LIGATION AND STRIPPING Right 02/08/2013   Procedure: Segmental excision of painful varicose veins right lower extremity;  Surgeon: Chuck Hinthristopher S Dickson, MD;  [facility];  Service: Vascular;  Laterality: Right;     OB History   No obstetric history on file.      Home Medications    Prior to Admission medications   Medication Sig Start Date End Date Taking? Authorizing Provider  albuterol (VENTOLIN HFA) 108 (90 Base) MCG/ACT inhaler Inhale 2 puffs into the lungs every 6 (six) hours as needed for wheezing or shortness of breath. 01/10/19   Gwenlyn FudgeJoyce, Britney F, FNP  atorvastatin (LIPITOR) 20 MG tablet Take 1 tablet (20 mg total) by mouth daily. 01/20/19   Gwenlyn FudgeJoyce, Britney F, FNP  furosemide (LASIX) 40 MG tablet Take 1 tablet (40 mg total) by mouth 2 (two) times daily. 01/10/19   Gwenlyn FudgeJoyce, Britney F, FNP  losartan (COZAAR) 100 MG tablet Take 1 tablet (100 mg total) by mouth daily. 01/10/19   Gwenlyn FudgeJoyce, Britney F, FNP  Memantine HCl-Donepezil HCl [facility](NAMZARIC) 28-10 MG CP24 Take 1 capsule by mouth at bedtime. 01/10/19   Gwenlyn FudgeJoyce, Britney F, FNP  metoprolol tartrate (LOPRESSOR) 25 MG tablet Take 0.5 tablets (12.5 mg total) by mouth 2 (two) times daily.  01/10/19   [name], FNP  potassium chloride SA (K-DUR) 20 MEQ tablet Take 1 tablet (20 mEq total) by mouth 2 (two) times daily. 01/10/19   [name], FNP  Spacer/Aero-Holding Chambers (AEROCHAMBER PLUS) inhaler Use as instructed 01/10/19   [name], FNP  warfarin (COUMADIN) 5 MG tablet Takes 5 mg by mouth on Sunday, Tuesday and Thursday and 2.5 mg by mouth on Monday, Wednesday, Friday, and Saturday. 01/10/19   [name], FNP    Family History Family History  Problem Relation Age of Onset   Stroke  Father    Cancer Mother    Hypertension Mother    Cancer Sister    Cancer Sister    Cancer Sister    Hypertension Son     Social History Social History   Tobacco Use   Smoking status: Never Smoker   Smokeless tobacco: Never Used  Substance Use Topics   Alcohol use: No    Alcohol/week: 0.0 standard drinks   Drug use: No     Allergies   Codeine   Review of Systems Review of Systems  Unable to perform ROS: Dementia  Neurological:       Episode of altered mental status, resolved  Hematological: Bruises/bleeds easily.     Physical Exam Updated Vital Signs BP (!) 186/113    Pulse (!) 33    Temp (!) 97.5 F (36.4 C) (Oral)    Resp 19    Ht 5\' 6"  (1.676 m)    Wt 77.1 kg    SpO2 98%    BMI 27.44 kg/m   Physical Exam Vitals signs and nursing note reviewed.  Constitutional:      General: She is not in acute distress.    Appearance: She is well-developed.     Comments: Elderly, pleasantly demented female in no acute distress.  HENT:     Head: Normocephalic and atraumatic.  Eyes:     Extraocular Movements: Extraocular movements intact.     Conjunctiva/sclera: Conjunctivae normal.     Pupils: Pupils are equal, round, and reactive to light.  Neck:     Musculoskeletal: Normal range of motion and neck supple.  Cardiovascular:     Rate and Rhythm: Normal rate and regular rhythm.     Pulses: Normal pulses.  Pulmonary:     Effort: Pulmonary effort is normal. No respiratory distress.     Breath sounds: Normal breath sounds. No wheezing.  Abdominal:     General: There is no distension.     Palpations: Abdomen is soft. There is no mass.     Tenderness: There is abdominal tenderness. There is no guarding or rebound.     Comments: ttp of lower/suprapubic abd. No rigidity, guarding, distention.  Musculoskeletal:     Left lower leg: Edema present.     Comments: Left lower leg swelling and tenderness of the calf.  Pedal pulses intact.  Skin:    General: Skin is  warm and dry.     Capillary Refill: Capillary refill takes less than 2 seconds.  Neurological:     Mental Status: She is alert.     Comments: Oriented to person.  No obvious pronator drift. ? Slight decreased strength of L lower leg (cannot hold against gravity, pt staining but doing the same with R).       ED Treatments / Results  Labs (all labs ordered are listed, but only abnormal results are displayed) Labs Reviewed  COMPREHENSIVE METABOLIC PANEL - Abnormal;  Notable for the following components:      Result Value   Glucose, Bld 134 (*)    Creatinine, Ser 1.40 (*)    Total Bilirubin 1.7 (*)    GFR calc non Af Amer 35 (*)    GFR calc Af Amer 41 (*)    All other components within normal limits  CBC - Abnormal; Notable for the following components:   WBC 11.6 (*)    Hemoglobin 16.0 (*)    HCT 47.9 (*)    All other components within normal limits  CBG MONITORING, ED - Abnormal; Notable for the following components:   Glucose-Capillary 130 (*)    All other components within normal limits  URINE CULTURE  SARS CORONAVIRUS 2 (TAT 6-24 HRS)  PROTIME-INR  URINALYSIS, ROUTINE W REFLEX MICROSCOPIC    EKG EKG Interpretation  Date/Time:  Saturday May 14 2019 12:17:51 EST Ventricular Rate:  73 PR Interval:    QRS Duration: 132 QT Interval:  451 QTC Calculation: 497 R Axis:   95 Text Interpretation: Atrial fibrillation Ventricular premature complex RBBB and LPFB Consider left ventricular hypertrophy Repol abnrm suggests ischemia, lateral leads When compared to prior, new PVC. NO STEMI Confirmed by Theda Belfastegeler, Chris ([id number]) on 05/14/2019 12:19:03 PM   Radiology Dg Chest 2 View  Result Date: 05/14/2019 CLINICAL DATA:  Syncope EXAM: CHEST - 2 VIEW COMPARISON:  04/07/2018 FINDINGS: Stable cardiomediastinal contours. Calcific aortic knob. Unchanged appearance of patchy interstitial opacity the medial aspect of the right lower lobe and peripheral aspect of the left lower lobe,  likely chronic scarring or atelectasis. No new focal airspace consolidation no pleural effusion. No pneumothorax. No acute osseous findings. IMPRESSION: 1. No active cardiopulmonary disease. 2. Stable appearance of patchy interstitial opacity in the right lower lobe and peripheral aspect of the left lower lobe, likely scarring versus atelectasis. Electronically Signed   By: Duanne GuessNicholas  Plundo M.D.   On: 05/14/2019 13:37   Ct Head Wo Contrast  Result Date: 05/14/2019 CLINICAL DATA:  Altered level of consciousness. Bowel incontinence. EXAM: CT HEAD WITHOUT CONTRAST TECHNIQUE: Contiguous axial images were obtained from the base of the skull through the vertex without intravenous contrast. COMPARISON:  11/21/2013 head CT. FINDINGS: Brain: Chronic right frontal and right occipital encephalomalacia. No evidence of parenchymal hemorrhage or extra-axial fluid collection. No mass lesion, mass effect, or midline shift. No CT evidence of acute infarction. Nonspecific moderate subcortical and periventricular white matter hypodensity, most in keeping with chronic small vessel ischemic change. Generalized cerebral volume loss. Cerebral ventricle sizes are concordant with the degree of cerebral volume loss. Vascular: No acute abnormality. Skull: No evidence of calvarial fracture. Sinuses/Orbits: The visualized paranasal sinuses are essentially clear. Other:  The mastoid air cells are unopacified. IMPRESSION: 1. No evidence of acute intracranial abnormality. 2. Chronic right frontal and right occipital encephalomalacia. 3. Generalized cerebral volume loss and moderate chronic small vessel ischemic changes in the cerebral white matter. Electronically Signed   By: Delbert PhenixJason A Poff M.D.   On: 05/14/2019 13:30   Vas [place] Lower Extremity Venous (dvt) (only Mc & Wl 7a-7p)  Result Date: 05/14/2019  Lower Venous Study Indications: Pain, Swelling, and syncope.  Comparison Study: No prior Performing Technologist: Sherren Kernsandace Kanady RVS   Examination Guidelines: A complete evaluation includes B-mode imaging, spectral Doppler, color Doppler, and power Doppler as needed of all accessible portions of each vessel. Bilateral testing is considered an integral part of a complete examination. Limited examinations for reoccurring indications may be performed as noted.  +-----+---------------+---------+-----------+----------+--------------+  RIGHT Compressibility Phasicity Spontaneity Properties Thrombus Aging  +-----+---------------+---------+-----------+----------+--------------+  CFV   Full            Yes       Yes                                    +-----+---------------+---------+-----------+----------+--------------+   +---------+---------------+---------+-----------+----------+--------------+  LEFT      Compressibility Phasicity Spontaneity Properties Thrombus Aging  +---------+---------------+---------+-----------+----------+--------------+  CFV       Full            Yes       Yes                                    +---------+---------------+---------+-----------+----------+--------------+  SFJ       Full                                                             +---------+---------------+---------+-----------+----------+--------------+  FV Prox   Full                                                             +---------+---------------+---------+-----------+----------+--------------+  FV Mid    Full                                                             +---------+---------------+---------+-----------+----------+--------------+  FV Distal Full                                                             +---------+---------------+---------+-----------+----------+--------------+  PFV       Full                                                             +---------+---------------+---------+-----------+----------+--------------+  POP       Full            Yes       Yes                                     +---------+---------------+---------+-----------+----------+--------------+  PTV       None  Acute           +---------+---------------+---------+-----------+----------+--------------+  PERO      None                                             Acute           +---------+---------------+---------+-----------+----------+--------------+     Summary: Right: No evidence of common femoral vein obstruction. Left: Findings consistent with acute deep vein thrombosis involving the left posterior tibial veins, and left peroneal veins.  *See table(s) above for measurements and observations.    Preliminary     Procedures Procedures (including critical care time)  Medications Ordered in ED Medications  sodium chloride flush (NS) 0.9 % injection 3 mL (has no administration in time range)     Initial Impression / Assessment and Plan / ED Course  I have reviewed the triage vital signs and the nursing notes.  Pertinent labs & imaging results that were available during my care of the patient were reviewed by me and considered in my medical decision making (see chart for details).        Presenting for evaluation of episode of altered mental status.  Physical exam shows patient who appears pleasantly confused.  Nontoxic.  She does have tenderness and swelling of the left lower leg, concern for possible DVT.  Patient is on warfarin, will need INR to see if patient is at a therapeutic level.  As patient had an episode which included incontinence of bowel and bladder before returning to normal, consider seizure, although no history of the same.  Due to history and A. fib, consider stroke.  Will obtain EKG, labs, urine, CT head.  Labs shows subtherapeutic INR at 1.2.  Otherwise reassuring.  EKG without STEMI.  Shows new PVCs.  CT head negative for acute findings.  Chest x-ray viewed interpreted by me, no pneumonia pneumothorax and effusion.  Ultrasound positive for DVT of  the left lower leg.  This increases my concern for possible stroke.  Per husband, patient without previous left leg weakness.  Will consult with neurology, however patient will likely need hospitalization for work-up for possible TIA versus CVA versus seizure.  Discussed with Dr. Otelia Limes, neurology will evaluate the patient and put recommendations in.  Discussed with Dr. Ophelia Charter from triad hospitalist service, patient to be admitted.   Final Clinical Impressions(s) / ED Diagnoses   Final diagnoses:  Transient alteration of awareness  Acute deep vein thrombosis (DVT) of left lower extremity, unspecified vein [facility])    ED Discharge Orders    None       Alveria Apley, PA-C 05/14/19 1600    Alveria Apley, PA-C 05/14/19 1600    Tegeler, Canary Brim, MD 05/15/19 1744

## 2019-05-14 NOTE — ED Notes (Signed)
Husband at bedside.  

## 2019-05-14 NOTE — Progress Notes (Signed)
  Echocardiogram 2D Echocardiogram has been performed.  Wanda Mckenzie 05/14/2019, 5:42 PM

## 2019-05-14 NOTE — Consult Note (Addendum)
NEURO HOSPITALIST CONSULT NOTE   Requesting physician: Dr. Sherry Ruffing  Reason for Consult: AMS  History obtained from:  Chart  HPI:                                                                                                                                          Wanda Mckenzie is an 83 y.o. female with a PMHx of a-fib (on Coumadin), COPD, HTN, CVA (2007 right MCA), HLD, and dementia who presented after being found slumped over and incontinent of bowel and bladder.   Per chart review patient was sitting at the table slumped over and was incontinent of bowel. Patient has a history of dementia.  Attempted to call husband Wanda Mckenzie for more information, but there was no answer.   The patient is unable to provide her own history due to her dementia.   BP: 186/113  BG: 130 NIHSS: 2 for questions  Past Medical History:  Diagnosis Date  . Atrial fibrillation ([place])   . COPD (chronic obstructive pulmonary disease) ([place])   . Dyslipidemia   . Essential hypertension   . History of cardiac catheterization 11/2013   Normal coronaries  . History of stroke    Right MCA distribution     Past Surgical History:  Procedure Laterality Date  . ABDOMINAL HYSTERECTOMY  1975  . FRACTURE SURGERY Left   . LEFT HEART CATHETERIZATION WITH CORONARY ANGIOGRAM N/A 11/23/2013   Procedure: LEFT HEART CATHETERIZATION WITH CORONARY ANGIOGRAM;  Surgeon: Burnell Blanks, MD;  Location: [facility] CATH LAB;  Service: Cardiovascular;  Laterality: N/A;  . LEFT HIP HEMIARTHROPLASTY    . TRIBUTARY VARICOSITIES OF RIGHT LEG  05/23/2002  . VEIN LIGATION AND STRIPPING Right 02/08/2013   Procedure: Segmental excision of painful varicose veins right lower extremity;  Surgeon: Angelia Mould, MD;  Location: [facility] OR;  Service: Vascular;  Laterality: Right;    Family History  Problem Relation Age of Onset  . Stroke Father   . Cancer Mother   . Hypertension Mother   . Cancer Sister   . Cancer  Sister   . Cancer Sister   . Hypertension Son            Social History:  reports that she has never smoked. She has never used smokeless tobacco. She reports that she does not drink alcohol or use drugs.  Allergies  Allergen Reactions  . Codeine     REACTION: vomiting    MEDICATIONS:  Current Facility-Administered Medications  Medication Dose Route Frequency Provider Last Rate Last Dose  . sodium chloride flush (NS) 0.9 % injection 3 mL  3 mL Intravenous Once Tegeler, Canary Brimhristopher J, MD       Current Outpatient Medications  Medication Sig Dispense Refill  . albuterol (VENTOLIN HFA) 108 (90 Base) MCG/ACT inhaler Inhale 2 puffs into the lungs every 6 (six) hours as needed for wheezing or shortness of breath. 18 g 2  . atorvastatin (LIPITOR) 20 MG tablet Take 1 tablet (20 mg total) by mouth daily. 90 tablet 3  . furosemide (LASIX) 40 MG tablet Take 1 tablet (40 mg total) by mouth 2 (two) times daily. 60 tablet 5  . losartan (COZAAR) 100 MG tablet Take 1 tablet (100 mg total) by mouth daily. 30 tablet 5  . Memantine HCl-Donepezil HCl (NAMZARIC) 28-10 MG CP24 Take 1 capsule by mouth at bedtime. 30 capsule 5  . metoprolol tartrate (LOPRESSOR) 25 MG tablet Take 0.5 tablets (12.5 mg total) by mouth 2 (two) times daily. 30 tablet 5  . potassium chloride SA (K-DUR) 20 MEQ tablet Take 1 tablet (20 mEq total) by mouth 2 (two) times daily. 60 tablet 5  . Spacer/Aero-Holding Chambers (AEROCHAMBER PLUS) inhaler Use as instructed 1 each 2  . warfarin (COUMADIN) 5 MG tablet Takes 5 mg by mouth on Sunday, Tuesday and Thursday and 2.5 mg by mouth on Monday, Wednesday, Friday, and Saturday. 20 tablet 5    ROS:                                                                                                                                       ROS is unobtainable from patient due to mental  status. She does not appear to be in any pain.    Blood pressure (!) 178/67, pulse 62, temperature (!) 97.5 F (36.4 C), temperature source Oral, resp. rate 18, height 5\' 6"  (1.676 m), weight 77.1 kg, SpO2 95 %.   General Examination:                                                                                                       Physical Exam  Constitutional: Appears well-developed and well-nourished.  Psych: Affect appropriate to situation Eyes: Normal external eye and conjunctiva. HENT: Normocephalic, no lesions, without obvious abnormality.   Musculoskeletal-no joint tenderness, deformity or swelling Cardiovascular: Normal rate and regular rhythm.  Respiratory: Effort normal, non-labored breathing saturations WNL on RA  GI: Soft.  No distension.  There is no tenderness.  Skin: WDI  Neurological Examination Mental Status: Alert and oriented to name only.  Patient able to follow some simple commands, but exhibits significantly impaired attention and concentration.  Speech fluent but devoid of significant content and is unrelated to the questions asked, consisting primarily of short, stereotyped phrases. She cannot name simple objects. Unable to understand several basic commands and was unable to comprehend instructions for cerebellar testing. No dysarthria noted. Cranial Nerves: II: Blinks to threat bilaterally. PERRL.  III,IV, VI: ptosis not present, EOMI V,VII: smile symmetric, facial light touch sensation normal bilaterally VIII: hearing normal bilaterally IX,X: uvula rises symmetrically XI: bilateral shoulder shrug XII: midline tongue extension Motor: Able to move all 4 extremities anti gravity with good strength.  Tone and bulk:normal tone throughout; no atrophy noted Sensory: Light touch intact throughout, bilaterally Deep Tendon Reflexes: 2+ and symmetric biceps and patellae  Plantars: Right: downgoing  Left: downgoing Cerebellar: Unable to cooperate Gait:  Deferred   Lab Results: Basic Metabolic Panel: Recent Labs  Lab 05/14/19 1230  NA 142  K 3.8  CL 104  CO2 23  GLUCOSE 134*  BUN 22  CREATININE 1.40*  CALCIUM 9.3    CBC: Recent Labs  Lab 05/14/19 1230  WBC 11.6*  HGB 16.0*  HCT 47.9*  MCV 95.4  PLT 252   Imaging: Dg Chest 2 View  Result Date: 05/14/2019 CLINICAL DATA:  Syncope EXAM: CHEST - 2 VIEW COMPARISON:  04/07/2018 FINDINGS: Stable cardiomediastinal contours. Calcific aortic knob. Unchanged appearance of patchy interstitial opacity the medial aspect of the right lower lobe and peripheral aspect of the left lower lobe, likely chronic scarring or atelectasis. No new focal airspace consolidation no pleural effusion. No pneumothorax. No acute osseous findings. IMPRESSION: 1. No active cardiopulmonary disease. 2. Stable appearance of patchy interstitial opacity in the right lower lobe and peripheral aspect of the left lower lobe, likely scarring versus atelectasis. Electronically Signed   By: Duanne Guess M.D.   On: 05/14/2019 13:37   Ct Head Wo Contrast  Result Date: 05/14/2019 CLINICAL DATA:  Altered level of consciousness. Bowel incontinence. EXAM: CT HEAD WITHOUT CONTRAST TECHNIQUE: Contiguous axial images were obtained from the base of the skull through the vertex without intravenous contrast. COMPARISON:  11/21/2013 head CT. FINDINGS: Brain: Chronic right frontal and right occipital encephalomalacia. No evidence of parenchymal hemorrhage or extra-axial fluid collection. No mass lesion, mass effect, or midline shift. No CT evidence of acute infarction. Nonspecific moderate subcortical and periventricular white matter hypodensity, most in keeping with chronic small vessel ischemic change. Generalized cerebral volume loss. Cerebral ventricle sizes are concordant with the degree of cerebral volume loss. Vascular: No acute abnormality. Skull: No evidence of calvarial fracture. Sinuses/Orbits: The visualized paranasal sinuses  are essentially clear. Other:  The mastoid air cells are unopacified. IMPRESSION: 1. No evidence of acute intracranial abnormality. 2. Chronic right frontal and right occipital encephalomalacia. 3. Generalized cerebral volume loss and moderate chronic small vessel ischemic changes in the cerebral white matter. Electronically Signed   By: Delbert Phenix M.D.   On: 05/14/2019 13:30   Vas [place] Lower Extremity Venous (dvt) (only Mc & Wl 7a-7p)  Result Date: 05/14/2019  Lower Venous Study Indications: Pain, Swelling, and syncope.  Comparison Study: No prior Performing Technologist: Sherren Kerns RVS  Examination Guidelines: A complete evaluation includes B-mode imaging, spectral Doppler, color Doppler, and power Doppler as needed of all accessible portions of each vessel. Bilateral testing is considered an integral part of a  complete examination. Limited examinations for reoccurring indications may be performed as noted.  +-----+---------------+---------+-----------+----------+--------------+ RIGHTCompressibilityPhasicitySpontaneityPropertiesThrombus Aging +-----+---------------+---------+-----------+----------+--------------+ CFV  Full           Yes      Yes                                 +-----+---------------+---------+-----------+----------+--------------+   +---------+---------------+---------+-----------+----------+--------------+ LEFT     CompressibilityPhasicitySpontaneityPropertiesThrombus Aging +---------+---------------+---------+-----------+----------+--------------+ CFV      Full           Yes      Yes                                 +---------+---------------+---------+-----------+----------+--------------+ SFJ      Full                                                        +---------+---------------+---------+-----------+----------+--------------+ FV Prox  Full                                                         +---------+---------------+---------+-----------+----------+--------------+ FV Mid   Full                                                        +---------+---------------+---------+-----------+----------+--------------+ FV DistalFull                                                        +---------+---------------+---------+-----------+----------+--------------+ PFV      Full                                                        +---------+---------------+---------+-----------+----------+--------------+ POP      Full           Yes      Yes                                 +---------+---------------+---------+-----------+----------+--------------+ PTV      None                                         Acute          +---------+---------------+---------+-----------+----------+--------------+ PERO     None  Acute          +---------+---------------+---------+-----------+----------+--------------+     Summary: Right: No evidence of common femoral vein obstruction. Left: Findings consistent with acute deep vein thrombosis involving the left posterior tibial veins, and left peroneal veins.  *See table(s) above for measurements and observations.    Preliminary     Assessment: 83 y.o. female with a PMHx of a-fib (on Coumadin), COPD, HTN, CVA (2007 right MCA), HLD and dementia who presented after being found slumped over and incontinent of bowel/bladder.  1. CT head without acute abnormality. Chronic right frontal and right occipital encephalomalacia from old strokes is noted. Generalized cerebral volume loss and moderate chronic small vessel ischemic changes in the cerebral white matter.  2. DDx for her presentation includes seizure versus TIA/stroke versus syncope versus transient alteration of consciousness in the context of advanced dementia 3. INR was subtherapeutic at 1.2 on presentation  Recommendations: --EEG --MRI brain --MRA  head  --Carotid dopplers --TTE --Hgba1c and lipid panel --OT/PT --Stroke swallow screen --Cardiac telemetry --Continue Coumadin. Pharmacy to dose.   Valentina LucksJessica Williams, MSN, NP-C [facility] [phone]   I have seen and examined the patient. I have formulated the assessment and recommendations. 83 year old female presenting after being found slumped over incontinent of bowel/bladder. Exam is nonlateralizing, but consistent with advanced dementia. DDx for her presentation includes seizure versus TIA/stroke versus syncope versus transient alteration of consciousness in the context of advanced dementia Electronically signed: Dr. Caryl PinaEric Macall Mccroskey 05/14/2019, 3:24 PM

## 2019-05-14 NOTE — H&P (Signed)
History and Physical    ISALY Mckenzie RVU:[id number] DOB: [birth year] DOA: 05/14/2019  PCP: [name], FNP Consultants:  None Patient coming from:  Home - lives with husband and has a caregiver; NOK: [phone]  Chief Complaint: AMS  HPI: Wanda Mckenzie is a 83 y.o. female with medical history significant of CVA; HTN; HLD; COPD; dementia; and afib presenting with AMS.  Patient has advanced dementia and is unable to answer questions.  I called and discussed the patient's husband.  He reports that a caregiver was there when episode happened.  Sat and ate a few bites, looked like she was walking to fall out of the chair and so they laid her in the floor and called 911.  She did not lose consciousness.  She has been refusing her medications some, would not eat or take meds the last few days.     ED Course:  Dementia, presenting with AMS, ?history.  Slumped over, lost control of bowel/bladder.  Back to baseline upon EMS arrival.  ?LLE weakness. LLE pain, +DVT despite Coumadin for afib (INR subtherapeutic).  ?CVA/TIA, seizure.  Neurology will consult.  Review of Systems: Unable to perform   Past Medical History:  Diagnosis Date   Atrial fibrillation (HCC)    COPD (chronic obstructive pulmonary disease) (HCC)    Dyslipidemia    Essential hypertension    History of cardiac catheterization 11/2013   Normal coronaries   History of stroke    Right MCA distribution     Past Surgical History:  Procedure Laterality Date   ABDOMINAL HYSTERECTOMY  1975   FRACTURE SURGERY Left    LEFT HEART CATHETERIZATION WITH CORONARY ANGIOGRAM N/A 11/23/2013   Procedure: LEFT HEART CATHETERIZATION WITH CORONARY ANGIOGRAM;  Surgeon: Kathleene Hazel, MD;  Location: [facility] CATH LAB;  Service: Cardiovascular;  Laterality: N/A;   LEFT HIP HEMIARTHROPLASTY     TRIBUTARY VARICOSITIES OF RIGHT LEG  05/23/2002   VEIN LIGATION AND STRIPPING Right 02/08/2013   Procedure: Segmental excision of  painful varicose veins right lower extremity;  Surgeon: Chuck Hint, MD;  Location: [facility];  Service: Vascular;  Laterality: Right;    Social History   Socioeconomic History   Marital status: Married    Spouse name: Not on file   Number of children: Not on file   Years of education: Not on file   Highest education level: Not on file  Occupational History   Not on file  Social Needs   Financial resource strain: Not on file   Food insecurity    Worry: Not on file    Inability: Not on file   Transportation needs    Medical: Not on file    Non-medical: Not on file  Tobacco Use   Smoking status: Never Smoker   Smokeless tobacco: Never Used  Substance and Sexual Activity   Alcohol use: No    Alcohol/week: 0.0 standard drinks   Drug use: No   Sexual activity: Not Currently  Lifestyle   Physical activity    Days per week: Not on file    Minutes per session: Not on file   Stress: Not on file  Relationships   Social connections    Talks on phone: Not on file    Gets together: Not on file    Attends religious service: Not on file    Active member of club or organization: Not on file    Attends meetings of clubs or organizations: Not on file  Relationship status: Not on file   Intimate partner violence    Fear of current or ex partner: Not on file    Emotionally abused: Not on file    Physically abused: Not on file    Forced sexual activity: Not on file  Other Topics Concern   Not on file  Social History Narrative   Not on file    Allergies  Allergen Reactions   Codeine     REACTION: vomiting    Family History  Problem Relation Age of Onset   Stroke Father    Cancer Mother    Hypertension Mother    Cancer Sister    Cancer Sister    Cancer Sister    Hypertension Son     Prior to Admission medications   Medication Sig Start Date End Date Taking? Authorizing Provider  albuterol (VENTOLIN HFA) 108 (90 Base) MCG/ACT inhaler  Inhale 2 puffs into the lungs every 6 (six) hours as needed for wheezing or shortness of breath. 01/10/19   Loman Brooklyn, FNP  atorvastatin (LIPITOR) 20 MG tablet Take 1 tablet (20 mg total) by mouth daily. 01/20/19   Loman Brooklyn, FNP  furosemide (LASIX) 40 MG tablet Take 1 tablet (40 mg total) by mouth 2 (two) times daily. 01/10/19   Loman Brooklyn, FNP  losartan (COZAAR) 100 MG tablet Take 1 tablet (100 mg total) by mouth daily. 01/10/19   Loman Brooklyn, FNP  Memantine HCl-Donepezil HCl [facility]) 28-10 MG CP24 Take 1 capsule by mouth at bedtime. 01/10/19   Loman Brooklyn, FNP  metoprolol tartrate (LOPRESSOR) 25 MG tablet Take 0.5 tablets (12.5 mg total) by mouth 2 (two) times daily. 01/10/19   Loman Brooklyn, FNP  potassium chloride SA (K-DUR) 20 MEQ tablet Take 1 tablet (20 mEq total) by mouth 2 (two) times daily. 01/10/19   Loman Brooklyn, FNP  Spacer/Aero-Holding Chambers (AEROCHAMBER PLUS) inhaler Use as instructed 01/10/19   Loman Brooklyn, FNP  warfarin (COUMADIN) 5 MG tablet Takes 5 mg by mouth on Sunday, Tuesday and Thursday and 2.5 mg by mouth on Monday, Wednesday, Friday, and Saturday. 01/10/19   Loman Brooklyn, FNP    Physical Exam: Vitals:   05/14/19 1239 05/14/19 1240 05/14/19 1300 05/14/19 1548  BP: (!) 178/67  (!) 174/78 (!) 186/113  Pulse: 62  62 (!) 33  Resp: 18  20 19   Temp: (!) 97.5 F (36.4 C)     TempSrc: Oral     SpO2: 95%  94% 98%  Weight:  77.1 kg    Height:  5\' 6"  (1.676 m)        General:  Appears calm and comfortable and is NAD; she clearly has advanced dementia  Eyes:  PERRL, EOMI, normal lids, iris  ENT:  grossly normal hearing, lips & tongue, mmm  Neck:  no LAD, masses or thyromegaly  Cardiovascular:  RRR, no m/r/g. No LE edema.   Respiratory:   CTA bilaterally with no wheezes/rales/rhonchi.  Normal respiratory effort.  Abdomen:  soft, mildly diffusely TTP, ND, NABS  Skin:  no rash or induration seen on limited  exam  Musculoskeletal:  grossly normal tone BUE/BLE,no bony abnormality; mild LLE edema and erythema  Psychiatric:  grossly normal mood and affect, speech fluent but inappropriate, AOx 0-1 (knows her first name and thinks maybe she knows her last name) Neurologic:  Unable to effectively perform   Radiological Exams on Admission: Dg Chest 2 View  Result Date: 05/14/2019 CLINICAL  DATA:  Syncope EXAM: CHEST - 2 VIEW COMPARISON:  04/07/2018 FINDINGS: Stable cardiomediastinal contours. Calcific aortic knob. Unchanged appearance of patchy interstitial opacity the medial aspect of the right lower lobe and peripheral aspect of the left lower lobe, likely chronic scarring or atelectasis. No new focal airspace consolidation no pleural effusion. No pneumothorax. No acute osseous findings. IMPRESSION: 1. No active cardiopulmonary disease. 2. Stable appearance of patchy interstitial opacity in the right lower lobe and peripheral aspect of the left lower lobe, likely scarring versus atelectasis. Electronically Signed   By: Duanne Guess M.D.   On: 05/14/2019 13:37   Ct Head Wo Contrast  Result Date: 05/14/2019 CLINICAL DATA:  Altered level of consciousness. Bowel incontinence. EXAM: CT HEAD WITHOUT CONTRAST TECHNIQUE: Contiguous axial images were obtained from the base of the skull through the vertex without intravenous contrast. COMPARISON:  11/21/2013 head CT. FINDINGS: Brain: Chronic right frontal and right occipital encephalomalacia. No evidence of parenchymal hemorrhage or extra-axial fluid collection. No mass lesion, mass effect, or midline shift. No CT evidence of acute infarction. Nonspecific moderate subcortical and periventricular white matter hypodensity, most in keeping with chronic small vessel ischemic change. Generalized cerebral volume loss. Cerebral ventricle sizes are concordant with the degree of cerebral volume loss. Vascular: No acute abnormality. Skull: No evidence of calvarial fracture.  Sinuses/Orbits: The visualized paranasal sinuses are essentially clear. Other:  The mastoid air cells are unopacified. IMPRESSION: 1. No evidence of acute intracranial abnormality. 2. Chronic right frontal and right occipital encephalomalacia. 3. Generalized cerebral volume loss and moderate chronic small vessel ischemic changes in the cerebral white matter. Electronically Signed   By: Delbert Phenix M.D.   On: 05/14/2019 13:30   Vas [place] Lower Extremity Venous (dvt) (only Mc & Wl 7a-7p)  Result Date: 05/14/2019  Lower Venous Study Indications: Pain, Swelling, and syncope.  Comparison Study: No prior Performing Technologist: Sherren Kerns RVS  Examination Guidelines: A complete evaluation includes B-mode imaging, spectral Doppler, color Doppler, and power Doppler as needed of all accessible portions of each vessel. Bilateral testing is considered an integral part of a complete examination. Limited examinations for reoccurring indications may be performed as noted.  +-----+---------------+---------+-----------+----------+--------------+  RIGHT Compressibility Phasicity Spontaneity Properties Thrombus Aging  +-----+---------------+---------+-----------+----------+--------------+  CFV   Full            Yes       Yes                                    +-----+---------------+---------+-----------+----------+--------------+   +---------+---------------+---------+-----------+----------+--------------+  LEFT      Compressibility Phasicity Spontaneity Properties Thrombus Aging  +---------+---------------+---------+-----------+----------+--------------+  CFV       Full            Yes       Yes                                    +---------+---------------+---------+-----------+----------+--------------+  SFJ       Full                                                             +---------+---------------+---------+-----------+----------+--------------+  FV Prox  Full                                                              +---------+---------------+---------+-----------+----------+--------------+  FV Mid    Full                                                             +---------+---------------+---------+-----------+----------+--------------+  FV Distal Full                                                             +---------+---------------+---------+-----------+----------+--------------+  PFV       Full                                                             +---------+---------------+---------+-----------+----------+--------------+  POP       Full            Yes       Yes                                    +---------+---------------+---------+-----------+----------+--------------+  PTV       None                                             Acute           +---------+---------------+---------+-----------+----------+--------------+  PERO      None                                             Acute           +---------+---------------+---------+-----------+----------+--------------+     Summary: Right: No evidence of common femoral vein obstruction. Left: Findings consistent with acute deep vein thrombosis involving the left posterior tibial veins, and left peroneal veins.  *See table(s) above for measurements and observations.    Preliminary     EKG: Independently reviewed.  Afib with rate 73; nonspecific ST changes with no evidence of acute ischemia   Labs on Admission: I have personally reviewed the available labs and imaging studies at the time of the admission.  Pertinent labs:   Glucose 134 BUN 22/Creatinine 1.40/GFR 35; 19/1.11/47 on 10/22 - likely at/near baseline by review of priors Bili 1.7 WBC 11.6 Hgb 16.0 INR 1.2; 2.1 on 11/6 COVID pending   Assessment/Plan Principal Problem:   AMS (altered mental status) Active Problems:   COPD (chronic obstructive pulmonary disease) (HCC)  Chronic atrial fibrillation (HCC)   Essential hypertension   Hyperlipidemia   Vascular dementia without  behavioral disturbance (HCC)   Chronic congestive heart failure (HCC)   Left leg DVT (HCC)   Stage 3b chronic kidney disease   AMS -Patient with baseline dementia and poor history - possible TIA vs. Seizure vs. Syncope at home this AM -Appears to be at baseline now -She has clearly not been taking Coumadin for afib and this does place her at increased risk for CVA -Will place in observation status for further evaluation -Telemetry monitoring -MRI/MRA -Carotid dopplers -Echo -EEG -Risk stratification with FLP; will also check TSH and UDS -ASA daily -Heparin instead of Coumadin for now -Neurology consult -PT/OT/ST/Nutrition Consults -SW consult for placement  HTN -Allow permissive HTN for now -Treat BP only if >220/120, and then with goal of 15% reduction -Hold ARB and BB and plan to restart in 48-72 hours   HLD -Check FLP -Continue Lipitor 20 mg daily for now    Afib with noncompliance with Coumadin -Patient's husband reports that she refuses to take medications for him -Her INR is 1.2 -Coumadin is likely a poor choice for her giving the monitoring parameters and she may benefit from transition to DOAC -Transitioning off Coumadin may also be reasonable at this time -Will place on Heparin for now  -Lopressor is for rate control but this is held for permissive HTN and HR will need to be monitored  LLE DVT -Heparin, as above  Chronic systolic CHF -last echo was in 9/18 and showed EF 45-50% -Will repeat echo -Appears to be compensated at this time  COPD -Continue Albuterol prn  Hypothyroidism -Abnormal TSH 4 months ago and on 10/22 -It does not appear that medication changes were made at that time -Will start low-dose Synthroid and arrange for outpatient PCP f/u  Dementia -Patient appears to have advanced dementia and it is clear that her husband is struggling to care for her at home -She appears likely to need placement -SW consult requested -Continue  Namzaric    Note: This patient has been tested and is pending for the novel coronavirus COVID-19.   DVT prophylaxis:  Heparin Code Status: DNR - confirmed with family Family Communication: None present; I spoke with her husband by telephone Disposition Plan:  Home once clinically improved Consults called: Neurology; PT/OT/ST/SW/Nutrition  Admission status: It is my clinical opinion that referral for OBSERVATION is reasonable and necessary in this patient based on the above information provided. The aforementioned taken together are felt to place the patient at high risk for further clinical deterioration. However it is anticipated that the patient may be medically stable for discharge from the hospital within 24 to 48 hours.    Jonah BlueJennifer Renesme Kerrigan MD [facility]   How to contact the [facility] Attending or Consulting provider 7A - 7P or covering provider during after hours 7P -7A, for this patient?  1. Check the care team in [facility] and look for a) attending/consulting [facility] provider listed and b) the [facility] team listed 2. Log into [email or web address] and use [place]'s universal password to access. If you do not have the password, please contact the hospital operator. 3. Locate the [facility] provider you are looking for under [facility] and page to a number that you can be directly reached. 4. If you still have difficulty reaching the provider, please page the [facility] (Director on Call) for the Hospitalists listed on amion for assistance.   05/14/2019, 5:15 PM

## 2019-05-14 NOTE — Progress Notes (Signed)
VASCULAR LAB PRELIMINARY  PRELIMINARY  PRELIMINARY  PRELIMINARY  Left lower extremity venous duplex completed.    Preliminary report:  See CV proc for preliminary results.  Called report to [facility], PA-C  Gerrie Castiglia, [place], RVT 05/14/2019, 2:16 PM

## 2019-05-15 ENCOUNTER — Encounter (HOSPITAL_COMMUNITY): Payer: Medicare HMO

## 2019-05-15 ENCOUNTER — Observation Stay (HOSPITAL_COMMUNITY): Payer: Medicare HMO

## 2019-05-15 DIAGNOSIS — G9341 Metabolic encephalopathy: Secondary | ICD-10-CM | POA: Diagnosis present

## 2019-05-15 DIAGNOSIS — E782 Mixed hyperlipidemia: Secondary | ICD-10-CM | POA: Diagnosis not present

## 2019-05-15 DIAGNOSIS — I82402 Acute embolism and thrombosis of unspecified deep veins of left lower extremity: Secondary | ICD-10-CM | POA: Diagnosis not present

## 2019-05-15 DIAGNOSIS — I130 Hypertensive heart and chronic kidney disease with heart failure and stage 1 through stage 4 chronic kidney disease, or unspecified chronic kidney disease: Secondary | ICD-10-CM | POA: Diagnosis present

## 2019-05-15 DIAGNOSIS — F0390 Unspecified dementia without behavioral disturbance: Secondary | ICD-10-CM | POA: Diagnosis not present

## 2019-05-15 DIAGNOSIS — R4189 Other symptoms and signs involving cognitive functions and awareness: Secondary | ICD-10-CM | POA: Diagnosis present

## 2019-05-15 DIAGNOSIS — N179 Acute kidney failure, unspecified: Secondary | ICD-10-CM | POA: Diagnosis present

## 2019-05-15 DIAGNOSIS — Z823 Family history of stroke: Secondary | ICD-10-CM | POA: Diagnosis not present

## 2019-05-15 DIAGNOSIS — I10 Essential (primary) hypertension: Secondary | ICD-10-CM | POA: Diagnosis not present

## 2019-05-15 DIAGNOSIS — Z8249 Family history of ischemic heart disease and other diseases of the circulatory system: Secondary | ICD-10-CM | POA: Diagnosis not present

## 2019-05-15 DIAGNOSIS — J449 Chronic obstructive pulmonary disease, unspecified: Secondary | ICD-10-CM | POA: Diagnosis present

## 2019-05-15 DIAGNOSIS — Z8673 Personal history of transient ischemic attack (TIA), and cerebral infarction without residual deficits: Secondary | ICD-10-CM | POA: Diagnosis not present

## 2019-05-15 DIAGNOSIS — F0150 Vascular dementia without behavioral disturbance: Secondary | ICD-10-CM | POA: Diagnosis present

## 2019-05-15 DIAGNOSIS — Z66 Do not resuscitate: Secondary | ICD-10-CM | POA: Diagnosis present

## 2019-05-15 DIAGNOSIS — Z885 Allergy status to narcotic agent status: Secondary | ICD-10-CM | POA: Diagnosis not present

## 2019-05-15 DIAGNOSIS — I482 Chronic atrial fibrillation, unspecified: Secondary | ICD-10-CM | POA: Diagnosis not present

## 2019-05-15 DIAGNOSIS — Z23 Encounter for immunization: Secondary | ICD-10-CM | POA: Diagnosis not present

## 2019-05-15 DIAGNOSIS — I82442 Acute embolism and thrombosis of left tibial vein: Secondary | ICD-10-CM | POA: Diagnosis present

## 2019-05-15 DIAGNOSIS — Z7901 Long term (current) use of anticoagulants: Secondary | ICD-10-CM | POA: Diagnosis not present

## 2019-05-15 DIAGNOSIS — R4182 Altered mental status, unspecified: Secondary | ICD-10-CM | POA: Diagnosis present

## 2019-05-15 DIAGNOSIS — E876 Hypokalemia: Secondary | ICD-10-CM | POA: Diagnosis not present

## 2019-05-15 DIAGNOSIS — Z809 Family history of malignant neoplasm, unspecified: Secondary | ICD-10-CM | POA: Diagnosis not present

## 2019-05-15 DIAGNOSIS — I5022 Chronic systolic (congestive) heart failure: Secondary | ICD-10-CM | POA: Diagnosis not present

## 2019-05-15 DIAGNOSIS — E785 Hyperlipidemia, unspecified: Secondary | ICD-10-CM | POA: Diagnosis present

## 2019-05-15 DIAGNOSIS — Z20828 Contact with and (suspected) exposure to other viral communicable diseases: Secondary | ICD-10-CM | POA: Diagnosis present

## 2019-05-15 DIAGNOSIS — Z9114 Patient's other noncompliance with medication regimen: Secondary | ICD-10-CM | POA: Diagnosis not present

## 2019-05-15 DIAGNOSIS — N1832 Chronic kidney disease, stage 3b: Secondary | ICD-10-CM | POA: Diagnosis present

## 2019-05-15 DIAGNOSIS — E039 Hypothyroidism, unspecified: Secondary | ICD-10-CM | POA: Diagnosis present

## 2019-05-15 LAB — LIPID PANEL
Cholesterol: 126 mg/dL (ref 0–200)
HDL: 28 mg/dL — ABNORMAL LOW (ref >40)
LDL Cholesterol: 85 mg/dL (ref 0–99)
Total CHOL/HDL Ratio: 4.5 RATIO
Triglycerides: 64 mg/dL (ref <150)
VLDL: 13 mg/dL (ref 0–40)

## 2019-05-15 LAB — URINE CULTURE

## 2019-05-15 LAB — CBC
HCT: 40.6 % (ref 36.0–46.0)
Hemoglobin: 14 g/dL (ref 12.0–15.0)
MCH: 31.7 pg (ref 26.0–34.0)
MCHC: 34.5 g/dL (ref 30.0–36.0)
MCV: 91.9 fL (ref 80.0–100.0)
Platelets: 236 10*3/uL (ref 150–400)
RBC: 4.42 MIL/uL (ref 3.87–5.11)
RDW: 12.3 % (ref 11.5–15.5)
WBC: 7.9 10*3/uL (ref 4.0–10.5)
nRBC: 0 % (ref 0.0–0.2)

## 2019-05-15 LAB — HEPARIN LEVEL (UNFRACTIONATED)
Heparin Unfractionated: 0.46 IU/mL (ref 0.30–0.70)
Heparin Unfractionated: 0.54 IU/mL (ref 0.30–0.70)
Heparin Unfractionated: 0.86 IU/mL — ABNORMAL HIGH (ref 0.30–0.70)

## 2019-05-15 LAB — HEMOGLOBIN A1C
Hgb A1c MFr Bld: 5.5 % (ref 4.8–5.6)
Mean Plasma Glucose: 111.15 mg/dL

## 2019-05-15 MED ORDER — HYDRALAZINE HCL 20 MG/ML IJ SOLN: 5.0000 mg | Freq: Four times a day (QID) | INTRAMUSCULAR | Status: DC | PRN

## 2019-05-15 MED ORDER — HYDRALAZINE HCL 25 MG PO TABS
25.0000 mg | ORAL_TABLET | Freq: Three times a day (TID) | ORAL | Status: DC
  Administered 2019-05-15: 25 mg via ORAL
  Filled 2019-05-15: qty 1

## 2019-05-15 MED ORDER — HYDRALAZINE HCL 20 MG/ML IJ SOLN
5.0000 mg | INTRAMUSCULAR | Status: DC | PRN
  Administered 2019-05-16 – 2019-05-25 (×6): 5 mg via INTRAVENOUS
  Filled 2019-05-15 (×7): qty 1

## 2019-05-15 MED ORDER — HYDRALAZINE HCL 20 MG/ML IJ SOLN: 10.0000 mg | Freq: Four times a day (QID) | INTRAMUSCULAR | Status: DC | PRN

## 2019-05-15 MED ORDER — METOPROLOL TARTRATE 12.5 MG HALF TABLET
12.5000 mg | ORAL_TABLET | Freq: Two times a day (BID) | ORAL | Status: DC
  Administered 2019-05-15: 12.5 mg via ORAL
  Filled 2019-05-15: qty 1

## 2019-05-15 MED ORDER — LOSARTAN POTASSIUM 50 MG PO TABS: 100.0000 mg | ORAL_TABLET | Freq: Every day | ORAL | Status: DC

## 2019-05-15 MED ORDER — HYDRALAZINE HCL 10 MG PO TABS
10.0000 mg | ORAL_TABLET | Freq: Three times a day (TID) | ORAL | Status: DC
  Administered 2019-05-15: 10 mg via ORAL
  Filled 2019-05-15: qty 1

## 2019-05-15 NOTE — Progress Notes (Signed)
PROGRESS NOTE  Wanda Mckenzie QIH:[id number] DOB: 11/28/1935 DOA: 05/14/2019 PCP: Loman Brooklyn, FNP  HPI/Recap of past 24 hours: HPI: Wanda Mckenzie is a 83 y.o. female with medical history significant of CVA; HTN; HLD; COPD; dementia; and afib presenting with AMS.  Patient has advanced dementia and is unable to answer questions.  I called and discussed the patient's husband.  He reports that a caregiver was there when episode happened.  Sat and ate a few bites, looked like she was walking to fall out of the chair and so they laid her in the floor and called 911.  She did not lose consciousness.  She has been refusing her medications some, would not eat or take meds the last few days.     ED Course:  Dementia, presenting with AMS, ?history.  Slumped over, lost control of bowel/bladder.  Back to baseline upon EMS arrival.  ?LLE weakness. LLE pain, +DVT despite Coumadin for afib (INR subtherapeutic).  ?CVA/TIA, seizure.  Neurology will consult.  05/15/19: Patient was seen and examined at her bedside this morning.  She is alert but confused in a state of dementia.  She does not follow commands however she is able to move her upper extremities bilaterally.  On heparin drip for acute left lower extremity DVT.  Home Coumadin on hold.  Will consider switching to direct oral anticoagulation like Eliquis.  Assessment/Plan: Principal Problem:   AMS (altered mental status) Active Problems:   COPD (chronic obstructive pulmonary disease) (HCC)   Chronic atrial fibrillation (HCC)   Essential hypertension   Hyperlipidemia   Vascular dementia without behavioral disturbance (HCC)   Chronic congestive heart failure (HCC)   Left leg DVT (HCC)   Stage 3b chronic kidney disease   Acute metabolic encephalopathy in the context of dementia/TIA CVA work-up/seizure rule out evaluation Presented from home where she was slumped over and was incontinent of bowel and bladder Differential diagnosis includes  seizure versus TIA/stroke versus syncope versus transient alteration of consciousness in the context of advanced dementia Subtherapeutic INR, 1.2 in the setting of chronic A. fib on Coumadin with medication noncompliance. MRI brain without contrast and MRA neck without contrast pending 2D echo, pending results and EEG also pending. Ongoing permissive hypertension Treat blood pressure with IV hydralazine 5 mg every 6 hours as needed with SBP greater than 220 or DBP greater than 120.  Newly diagnosed acute left lower extremity DVT Coumadin on hold Continue heparin drip Plan to switch to direct oral anticoagulation, likely Eliquis  AKI on CKD 3. Baseline creatinine appears to be 1.1 with GFR of 47 Presented with creatinine of 1.4 with GFR of 35 Continue to avoid nephrotoxins Monitor urine output Start daily BMPs  Chronic A. fib on Coumadin Goal INR between 2 and 3 Noncompliant with medications Coumadin is currently on hold Continue heparin Switch to DOAC Rate is controlled  Dementia Reorient as needed Fall precautions  Uncontrolled hypertension Ongoing permissive hypertension Blood pressure medications on hold Gradually restart meds Continue to closely monitor  Hyperlipidemia LDL 85 on 05/16/2019 Continue Lipitor 20 mg daily  Hypothyroidism Continue levothyroxine  Physical debility PT OT to assess Fall precautions    DVT prophylaxis:Heparin drip Code Status:DNR - confirmed with family Family Communication:None at bedside.  Will contact family.  Disposition Plan:Patient is currently not appropriate for discharge at this time due to newly diagnosed acute left lower extremity DVT on heparin drip, ongoing TIA/CVA work-up with ongoing permissive hypertension requiring gradual normalization of blood pressure.  Patient will require at least 2 midnights for further evaluation and treatment of present condition.   Consults called:Neurology; PT/OT/ST/SW/Nutrition     Objective: Vitals:   05/15/19 0009 05/15/19 0112 05/15/19 0417 05/15/19 0712  BP: (!) 211/80 (!) 181/91 (!) 196/84 (!) 215/71  Pulse: 68  70 68  Resp: 16  16 18   Temp: (!) 97.5 F (36.4 C)  97.7 F (36.5 C) 99 F (37.2 C)  TempSrc: Axillary  Axillary Axillary  SpO2: 96%  97% 98%  Weight:      Height:        Intake/Output Summary (Last 24 hours) at 05/15/2019 0846 Last data filed at 05/15/2019 0400 Gross per 24 hour  Intake 965.05 ml  Output -  Net 965.05 ml   Filed Weights   05/14/19 1240  Weight: 77.1 kg    Exam:  . General: 83 y.o. year-old female well developed well nourished in no acute distress.  Alert and interactive in the state of dementia. . Cardiovascular: Irregular rate and rhythm with no rubs or gallops.  No thyromegaly or JVD noted.   Marland Kitchen. Respiratory: Clear to auscultation with no wheezes or rales. Good inspiratory effort. . Abdomen: Soft nontender nondistended with normal bowel sounds x4 quadrants. . Musculoskeletal: Left lower extremity edema.  Marland Kitchen. Psychiatry: Mood is appropriate for condition and setting   Data Reviewed: CBC: Recent Labs  Lab 05/14/19 1230 05/15/19 0211  WBC 11.6* 7.9  HGB 16.0* 14.0  HCT 47.9* 40.6  MCV 95.4 91.9  PLT 252 236   Basic Metabolic Panel: Recent Labs  Lab 05/14/19 1230  NA 142  K 3.8  CL 104  CO2 23  GLUCOSE 134*  BUN 22  CREATININE 1.40*  CALCIUM 9.3   GFR: Estimated Creatinine Clearance: 33.6 mL/min (A) (by C-G formula based on SCr of 1.4 mg/dL (H)). Liver Function Tests: Recent Labs  Lab 05/14/19 1230  AST 33  ALT 25  ALKPHOS 93  BILITOT 1.7*  PROT 6.9  ALBUMIN 4.0   No results for input(s): LIPASE, AMYLASE in the last 168 hours. No results for input(s): AMMONIA in the last 168 hours. Coagulation Profile: Recent Labs  Lab 05/14/19 1230  INR 1.2   Cardiac Enzymes: No results for input(s): CKTOTAL, CKMB, CKMBINDEX, TROPONINI in the last 168 hours. BNP (last 3 results) No results for  input(s): PROBNP in the last 8760 hours. HbA1C: No results for input(s): HGBA1C in the last 72 hours. CBG: Recent Labs  Lab 05/14/19 1251  GLUCAP 130*   Lipid Profile: Recent Labs    05/15/19 0211  CHOL 126  HDL 28*  LDLCALC 85  TRIG 64  CHOLHDL 4.5   Thyroid Function Tests: No results for input(s): TSH, T4TOTAL, FREET4, T3FREE, THYROIDAB in the last 72 hours. Anemia Panel: No results for input(s): VITAMINB12, FOLATE, FERRITIN, TIBC, IRON, RETICCTPCT in the last 72 hours. Urine analysis:    Component Value Date/Time   COLORURINE YELLOW 05/14/2019 1633   APPEARANCEUR HAZY (A) 05/14/2019 1633   APPEARANCEUR Clear 02/17/2019 1154   LABSPEC 1.014 05/14/2019 1633   PHURINE 5.0 05/14/2019 1633   GLUCOSEU NEGATIVE 05/14/2019 1633   HGBUR SMALL (A) 05/14/2019 1633   BILIRUBINUR NEGATIVE 05/14/2019 1633   BILIRUBINUR Negative 02/17/2019 1154   KETONESUR NEGATIVE 05/14/2019 1633   PROTEINUR NEGATIVE 05/14/2019 1633   UROBILINOGEN 1.0 02/08/2013 0911   NITRITE NEGATIVE 05/14/2019 1633   LEUKOCYTESUR NEGATIVE 05/14/2019 1633   Sepsis Labs: @LABRCNTIP (procalcitonin:4,lacticidven:4)  ) Recent Results (from the past 240 hour(s))  SARS CORONAVIRUS 2 (TAT 6-24 HRS) Nasopharyngeal Nasopharyngeal Swab     Status: None   Collection Time: 05/14/19  3:35 PM   Specimen: Nasopharyngeal Swab  Result Value Ref Range Status   SARS Coronavirus 2 NEGATIVE NEGATIVE Final    Comment: (NOTE) SARS-CoV-2 target nucleic acids are NOT DETECTED. The SARS-CoV-2 RNA is generally detectable in upper and lower respiratory specimens during the acute phase of infection. Negative results do not preclude SARS-CoV-2 infection, do not rule out co-infections with other pathogens, and should not be used as the sole basis for treatment or other patient management decisions. Negative results must be combined with clinical observations, patient history, and epidemiological information. The expected result is  Negative. Fact Sheet for Patients: HairSlick.no Fact Sheet for Healthcare Providers: quierodirigir.com This test is not yet approved or cleared by the [place] FDA and  has been authorized for detection and/or diagnosis of SARS-CoV-2 by FDA under an Emergency Use Authorization (EUA). This EUA will remain  in effect (meaning this test can be used) for the duration of the COVID-19 declaration under Section 56 4(b)(1) of the Act, 21 U.S.C. section 360bbb-3(b)(1), unless the authorization is terminated or revoked sooner. Performed at [facility], [address] [place], [place] [address]       Studies: Dg Chest 2 View  Result Date: 05/14/2019 CLINICAL DATA:  Syncope EXAM: CHEST - 2 VIEW COMPARISON:  04/07/2018 FINDINGS: Stable cardiomediastinal contours. Calcific aortic knob. Unchanged appearance of patchy interstitial opacity the medial aspect of the right lower lobe and peripheral aspect of the left lower lobe, likely chronic scarring or atelectasis. No new focal airspace consolidation no pleural effusion. No pneumothorax. No acute osseous findings. IMPRESSION: 1. No active cardiopulmonary disease. 2. Stable appearance of patchy interstitial opacity in the right lower lobe and peripheral aspect of the left lower lobe, likely scarring versus atelectasis. Electronically Signed   By: Duanne Guess M.D.   On: 05/14/2019 13:37   Ct Head Wo Contrast  Result Date: 05/14/2019 CLINICAL DATA:  Altered level of consciousness. Bowel incontinence. EXAM: CT HEAD WITHOUT CONTRAST TECHNIQUE: Contiguous axial images were obtained from the base of the skull through the vertex without intravenous contrast. COMPARISON:  11/21/2013 head CT. FINDINGS: Brain: Chronic right frontal and right occipital encephalomalacia. No evidence of parenchymal hemorrhage or extra-axial fluid collection. No mass lesion, mass effect, or midline shift. No CT  evidence of acute infarction. Nonspecific moderate subcortical and periventricular white matter hypodensity, most in keeping with chronic small vessel ischemic change. Generalized cerebral volume loss. Cerebral ventricle sizes are concordant with the degree of cerebral volume loss. Vascular: No acute abnormality. Skull: No evidence of calvarial fracture. Sinuses/Orbits: The visualized paranasal sinuses are essentially clear. Other:  The mastoid air cells are unopacified. IMPRESSION: 1. No evidence of acute intracranial abnormality. 2. Chronic right frontal and right occipital encephalomalacia. 3. Generalized cerebral volume loss and moderate chronic small vessel ischemic changes in the cerebral white matter. Electronically Signed   By: Delbert Phenix M.D.   On: 05/14/2019 13:30   Vas [place] Lower Extremity Venous (dvt) (only Mc & Wl 7a-7p)  Result Date: 05/14/2019  Lower Venous Study Indications: Pain, Swelling, and syncope.  Comparison Study: No prior Performing Technologist: Sherren Kerns RVS  Examination Guidelines: A complete evaluation includes B-mode imaging, spectral Doppler, color Doppler, and power Doppler as needed of all accessible portions of each vessel. Bilateral testing is considered an integral part of a complete examination. Limited examinations for reoccurring indications may  be performed as noted.  +-----+---------------+---------+-----------+----------+--------------+ RIGHTCompressibilityPhasicitySpontaneityPropertiesThrombus Aging +-----+---------------+---------+-----------+----------+--------------+ CFV  Full           Yes      Yes                                 +-----+---------------+---------+-----------+----------+--------------+   +---------+---------------+---------+-----------+----------+--------------+ LEFT     CompressibilityPhasicitySpontaneityPropertiesThrombus Aging +---------+---------------+---------+-----------+----------+--------------+ CFV      Full            Yes      Yes                                 +---------+---------------+---------+-----------+----------+--------------+ SFJ      Full                                                        +---------+---------------+---------+-----------+----------+--------------+ FV Prox  Full                                                        +---------+---------------+---------+-----------+----------+--------------+ FV Mid   Full                                                        +---------+---------------+---------+-----------+----------+--------------+ FV DistalFull                                                        +---------+---------------+---------+-----------+----------+--------------+ PFV      Full                                                        +---------+---------------+---------+-----------+----------+--------------+ POP      Full           Yes      Yes                                 +---------+---------------+---------+-----------+----------+--------------+ PTV      None                                         Acute          +---------+---------------+---------+-----------+----------+--------------+ PERO     None                                         Acute          +---------+---------------+---------+-----------+----------+--------------+  Summary: Right: No evidence of common femoral vein obstruction. Left: Findings consistent with acute deep vein thrombosis involving the left posterior tibial veins, and left peroneal veins.  *See table(s) above for measurements and observations.    Preliminary     Scheduled Meds: . aspirin  300 mg Rectal Daily   Or  . aspirin  325 mg Oral Daily  . atorvastatin  20 mg Oral Daily  . donepezil  10 mg Oral QHS   And  . memantine  28 mg Oral QHS  . levothyroxine  12.5 mcg Oral QAC breakfast    Continuous Infusions: . sodium chloride 50 mL/hr at 05/15/19 0400  . heparin 950  Units/hr (05/15/19 0400)     LOS: 0 days     Darlin Droparole N Estephania Licciardi, MD [facility] Pager [phone]  If 7PM-7AM, please contact night-coverage [email or web address] Password [email or web address] 05/15/2019, 8:46 AM

## 2019-05-15 NOTE — Progress Notes (Addendum)
STROKE TEAM PROGRESS NOTE   HISTORY OF PRESENT ILLNESS (per record) Wanda Mckenzie is an 83 y.o. female with a PMHx of a-fib (on Coumadin), COPD, HTN, CVA (2007 right MCA), HLD, and dementia who presented after being found slumped over and incontinent of bowel and bladder.  Per chart review patient was sitting at the table slumped over and was incontinent of bowel. Patient has a history of dementia.  Attempted to call husband Wanda Mckenzie for more information, but there was no answer.  The patient is unable to provide her own history due to her dementia.   BP: 186/113  BG: 130 NIHSS: 2 for questions   INTERVAL HISTORY Patient is resting comfortably in bed, pleasantly confused    OBJECTIVE Vitals:   05/14/19 2325 05/15/19 0009 05/15/19 0112 05/15/19 0417  BP: (!) 210/91 (!) 211/80 (!) 181/91 (!) 196/84  Pulse: (!) 59 68  70  Resp: 18 16  16   Temp: 98 F (36.7 C) (!) 97.5 F (36.4 C)  97.7 F (36.5 C)  TempSrc:  Axillary  Axillary  SpO2: 98% 96%  97%  Weight:      Height:        CBC:  Recent Labs  Lab 05/14/19 1230 05/15/19 0211  WBC 11.6* 7.9  HGB 16.0* 14.0  HCT 47.9* 40.6  MCV 95.4 91.9  PLT 252 236    Basic Metabolic Panel:  Recent Labs  Lab 05/14/19 1230  NA 142  K 3.8  CL 104  CO2 23  GLUCOSE 134*  BUN 22  CREATININE 1.40*  CALCIUM 9.3    Lipid Panel:     Component Value Date/Time   CHOL 126 05/15/2019 0211   TRIG 64 05/15/2019 0211   HDL 28 (L) 05/15/2019 0211   CHOLHDL 4.5 05/15/2019 0211   VLDL 13 05/15/2019 0211   LDLCALC 85 05/15/2019 0211   HgbA1c:  Lab Results  Component Value Date   HGBA1C 5.9 (H) 11/20/2013   Urine Drug Screen: No results found for: LABOPIA, COCAINSCRNUR, LABBENZ, AMPHETMU, THCU, LABBARB  Alcohol Level No results found for: [facility]  IMAGING  Dg Chest 2 View 05/14/2019 IMPRESSION:  1. No active cardiopulmonary disease.  2. Stable appearance of patchy interstitial opacity in the right lower lobe and peripheral  aspect of the left lower lobe, likely scarring versus atelectasis.   Ct Head Wo Contrast 05/14/2019 IMPRESSION:  1. No evidence of acute intracranial abnormality.  2. Chronic right frontal and right occipital encephalomalacia.  3. Generalized cerebral volume loss and moderate chronic small vessel ischemic changes in the cerebral white matter.    Vas 05/16/2019 Lower Extremity Venous (dvt) (only Mc & Wl 7a-7p) 05/14/2019 Summary:  Right: No evidence of common femoral vein obstruction.  Left: Findings consistent with acute deep vein thrombosis involving the left posterior tibial veins, and left peroneal veins.   Preliminary    Transthoracic Echocardiogram  00/00/2020 Pending   Bilateral Carotid Dopplers - pending  ECG - atrial fibrillation - ventricular response 87 BPM (See cardiology reading for complete details)  EEG - pending   PHYSICAL EXAM- unchanged from yesterday Blood pressure (!) 196/84, pulse 70, temperature 97.7 F (36.5 C), temperature source Axillary, resp. rate 16, height 5\' 6"  (1.676 m), weight 77.1 kg, SpO2 97 %.  Neurological Examination Mental Status: Alert and oriented to name only.  Patient able to follow some simple commands, but exhibits significantly impaired attention and concentration.  Speech fluent but devoid of significant content and is unrelated to the questions  asked, consisting primarily of short, stereotyped phrases. She cannot name simple objects. Unable to understand several basic commands and was unable to comprehend instructions for cerebellar testing. No dysarthria noted. Cranial Nerves: II: Blinks to threat bilaterally. PERRL.  III,IV, VI: ptosis not present, EOMI V,VII: smile symmetric, facial light touch sensation normal bilaterally VIII: hearing normal bilaterally IX,X: uvula rises symmetrically XI: bilateral shoulder shrug XII: midline tongue extension Motor: Able to move all 4 extremities anti gravity with good strength.  Tone and  bulk:normal tone throughout; no atrophy noted Sensory: Light touch intact throughout, bilaterally Deep Tendon Reflexes: 2+ and symmetric biceps and patellae  Plantars: Right: downgoing               Left: downgoing Cerebellar: Unable to cooperate Gait: Deferred     ASSESSMENT/PLAN Wanda Mckenzie is a 83 y.o. female with history of a-fib (on Coumadin), COPD, HTN, CVA (2007 right MCA), HLD, and dementia who presented after being found slumped over and incontinent of bowel and bladder.  She did not receive IV t-PA due to minimal deficits.  Stroke suspected:  MRI pending  Code Stroke CT Head - not ordered  CT head - No evidence of acute intracranial abnormality. Chronic right frontal and right occipital encephalomalacia. Generalized cerebral volume loss and moderate chronic small vessel ischemic changes in the cerebral white matter.  MRI head - pending  MRA head and neck - pending  CTA H&N - not ordered  CT Perfusion - not ordered  Carotid Doppler - pending - MRA neck pending - carotid dopplers probably not needed.Marland Kitchen  MRA neck pending - carotid dopplers not indicated. Bilateral Lower Extremity Venous Dopplers - Left lower extremity DVT - heparin IV Lacey Jensen Virus 2 - negative 2D Echo - pending  EEG - pending  LDL - 85  HgbA1c - not ordered - will order  UDS - not ordered  VTE prophylaxis - Heparin IV Diet  Diet Order            Diet Heart Fluid consistency: Thin  Diet effective ____              warfarin daily prior to admission, now on Heparin IV  Patient counseled to be compliant with her antithrombotic medications  Ongoing aggressive stroke risk factor management  Therapy recommendations:  pending  Disposition:  Pending  Hypertension  Home BP meds: Cozaar  Current BP meds: none   Blood pressure somewhat high at times but within post stroke/TIA parameters . Permissive hypertension (OK if < 220/120) but gradually normalize in 5-7 days   . Long-term BP goal normotensive  Hyperlipidemia  Home Lipid lowering medication: Lipitor 20 mg daily  LDL 85, goal < 70  Current lipid lowering medication: Lipitor 20 mg daily   Continue statin at discharge  Other Stroke Risk Factors  Advanced age  Overweight, Body mass index is 27.44 kg/m., recommend weight loss, diet and exercise as appropriate   Hx stroke/TIA  Family hx stroke (father)  Atrial fibrillation (warfarin)  Other Active Problems  Atrial fibrillation - warfarin PTA INR yesterday 1.2 - now on Heparin IV  Creatinine - 1.40  Left lower extremity DVT - Heparin IV  Hospital day # 0  Seizure vs TIA vs toxic/metabolic encephalopathy, workup in progress as above  To contact Stroke Continuity provider, please refer to [email or web address] After hours, contact General Neurology

## 2019-05-15 NOTE — Progress Notes (Signed)
Called the patient's son who is also medical POA, Rachelle Hora, to give update. No answer. Left a voicemail. Will call again.

## 2019-05-15 NOTE — Plan of Care (Signed)
Spoke with Dr Cristobal Goldmann regarding combined aspirin and heparin therapy. He feels this will increase bleeding risk and asked me to discontinue aspirin.  Mikey Bussing PA-C [facility] Pager [phone] 05/15/2019, 10:33 AM

## 2019-05-15 NOTE — Evaluation (Addendum)
Physical Therapy Evaluation Patient Details Name: Wanda Mckenzie MRN: [id number] DOB: [birth year] Today's Date: 05/15/2019   History of Present Illness   Wanda Mckenzie is a 83 y.o. female with medical history significant of CVA; HTN; HLD; COPD; dementia; and afib presenting with AMS. Admitted for TIA CVA work up/seizure rule out evaluation. CT negative for acute abnormality; pending MRI. On heparin drip for acute left lower extremity DVT.   Clinical Impression  Pt admitted with above. No family/caregiver available and pt unable to provide PLOF. Per chart review, pt lives with her husband and has a 24 hour caregiver. On PT evaluation, pt requiring maximal assist for bed mobility. Participating in limited self feeding on edge of bed with cues for initiation and set up assist. BP 175/108 prior to mobility; 214/73 post mobility. Presents with weakness and decreased cognition (likely at baseline). Recommending SNF at discharge; will continue to follow acutely to progress mobility.     Follow Up Recommendations SNF;Supervision/Assistance - 24 hour    Equipment Recommendations  Other (comment)(defer)    Recommendations for Other Services       Precautions / Restrictions Precautions Precautions: Fall Restrictions Weight Bearing Restrictions: No      Mobility  Bed Mobility Overal bed mobility: Needs Assistance Bed Mobility: Supine to Sit;Sit to Supine     Supine to sit: Max assist Sit to supine: Max assist   General bed mobility comments: MaxA for initiation of leg/truncal movement  Transfers                 General transfer comment: deferred  Ambulation/Gait                Stairs            Wheelchair Mobility    Modified Rankin (Stroke Patients Only) Modified Rankin (Stroke Patients Only) Pre-Morbid Rankin Score: Moderately severe disability Modified Rankin: Severe disability     Balance Overall balance assessment: Needs  assistance Sitting-balance support: Feet supported Sitting balance-Leahy Scale: Fair Sitting balance - Comments: supervision for safety; no hand support when self feeding                                     Pertinent Vitals/Pain Pain Assessment: Faces Faces Pain Scale: No hurt    Home Living Family/patient expects to be discharged to:: Skilled nursing facility                 Additional Comments: Pt unable to provide home living set up    Prior Function Level of Independence: Needs assistance      ADL's / Homemaking Assistance Needed: likely requires assist with all ADL's  Comments: Lives with husband; 24 hour caregiver. Pt unable to provide PLOF     Hand Dominance        Extremity/Trunk Assessment   Upper Extremity Assessment Upper Extremity Assessment: Defer to OT evaluation    Lower Extremity Assessment Lower Extremity Assessment: Generalized weakness;LLE deficits/detail;Difficult to assess due to impaired cognition;RLE deficits/detail RLE Deficits / Details: Moving spontaneously LLE Deficits / Details: Moving spontaneously; acute DVT       Communication   Communication: No difficulties  Cognition Arousal/Alertness: Awake/alert Behavior During Therapy: WFL for tasks assessed/performed Overall Cognitive Status: History of cognitive impairments - at baseline  General Comments: History of dementia; pt pleasantly confused, follows simple commands with multimodal cues and increased time, oriented to self only, not age or birthday.      General Comments      Exercises     Assessment/Plan    PT Assessment Patient needs continued PT services  PT Problem List Decreased strength;Decreased activity tolerance;Decreased balance;Decreased mobility;Decreased cognition       PT Treatment Interventions Gait training;Functional mobility training;Therapeutic activities;Therapeutic exercise;Balance  training;Patient/family education    PT Goals (Current goals can be found in the Care Plan section)  Acute Rehab PT Goals Patient Stated Goal: unable PT Goal Formulation: Patient unable to participate in goal setting Time For Goal Achievement: 05/29/19 Potential to Achieve Goals: Fair    Frequency Min 2X/week   Barriers to discharge        Co-evaluation PT/OT/SLP Co-Evaluation/Treatment: Yes Reason for Co-Treatment: Complexity of the patient's impairments (multi-system involvement);Necessary to address cognition/behavior during functional activity;For patient/therapist safety;To address functional/ADL transfers PT goals addressed during session: Mobility/safety with mobility         AM-PAC PT "6 Clicks" Mobility  Outcome Measure Help needed turning from your back to your side while in a flat bed without using bedrails?: Total Help needed moving from lying on your back to sitting on the side of a flat bed without using bedrails?: Total Help needed moving to and from a bed to a chair (including a wheelchair)?: Total Help needed standing up from a chair using your arms (e.g., wheelchair or bedside chair)?: Total Help needed to walk in hospital room?: Total Help needed climbing 3-5 steps with a railing? : Total 6 Click Score: 6    End of Session   Activity Tolerance: Patient tolerated treatment well Patient left: in bed;with call bell/phone within reach;with bed alarm set Nurse Communication: Mobility status PT Visit Diagnosis: Other abnormalities of gait and mobility (R26.89);Muscle weakness (generalized) (M62.81)    Time: 4332-9518 PT Time Calculation (min) (ACUTE ONLY): 21 min   Charges:   PT Evaluation $PT Eval Moderate Complexity: 1 Mod          Laurina Bustle, [place], DPT Acute Rehabilitation Services Pager [phone] Office [phone]   Vanetta Mulders 05/15/2019, 10:52 AM

## 2019-05-15 NOTE — Progress Notes (Addendum)
ANTICOAGULATION CONSULT NOTE  Pharmacy Consult:  Heparin Indication: Hx atrial fibrillation, acute DVT, possible acute CVA  Heparin Dosing Weight: 75 kg  Labs: Recent Labs    05/14/19 1230 05/15/19 0211 05/15/19 1530  HGB 16.0* 14.0  --   HCT 47.9* 40.6  --   PLT 252 236  --   LABPROT 14.7  --   --   INR 1.2  --   --   HEPARINUNFRC  --  0.46 0.86*  CREATININE 1.40*  --   --     Assessment: 49 yof on warfarin PTA for afib presenting with AMS, possible stroke, found to have acute LLE DVT. Pharmacy consulted to dose heparin. INR subtherapeutic on admit at 1.2. CT head negative for bleed. No other active bleed issues documented.  Per discussion with Neurology, will dose heparin for lower goal with no boluses per acute CVA protocol with MRI still pending.  Heparin level is supra-therapeutic (0.46 >> 0.86).  Confirmed with RN that lab was drawn appropriately; no bleeding reported.  Goal of Therapy:  Heparin level 0.3-0.5 units/ml, no boluses Monitor platelets by anticoagulation protocol: Yes   Plan:  Stat repeat heparin level to ensure lab is accurate (phlebotomy notified) Spoke to RN, once repeat lab is drawn, reduce heparin infusion right away to 750 units/hr while awaiting confirmatory result  Saquan Furtick D. Mina Marble, PharmD, BCPS, [place] 05/15/2019, 4:07 PM  ===================================  Addendum: Repeat heparin level is slightly supra-therapeutic at 0.54 and more consistent with previous level. Increase heparin gtt to 850 units/hr F/U AM labs  Navah Grondin D. Mina Marble, PharmD, BCPS, [place] 05/15/2019, 4:47 PM

## 2019-05-15 NOTE — Progress Notes (Signed)
CSW acknowledges consult for placement. The patient currently has pending PT/OT evaluations. CSW will assist with disposition planning based off therapy recommendations.   CSW will continue to follow.   Domenic Schwab, MSW, [place] Worker [facility]  [phone]
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
## 2019-05-15 NOTE — NC FL2 (Signed)
[place] LEVEL OF CARE SCREENING TOOL     IDENTIFICATION  Patient Name: Wanda Mckenzie Birthdate: 09/06/1935 Sex: female Admission Date (Current Location): 05/14/2019  [facility] and [place] Number:  [place] and Address:  [facility], [place] [address], [place], [place] [address]      Provider Number: [id number]  Attending Physician Name and Address:  Kayleen Memos, DO  Relative Name and Phone Number:  Farley Ly [place], Son, [phone]    Current Level of Care: Hospital Recommended Level of Care: [place] Prior Approval Number:    Date Approved/Denied:   PASRR Number: [id number]  Discharge Plan: SNF    Current Diagnoses: Patient Active Problem List   Diagnosis Date Noted  . AMS (altered mental status) 05/14/2019  . Left leg DVT ([place]) 05/14/2019  . Stage 3b chronic kidney disease 05/14/2019  . Elevated TSH 04/13/2019  . Chronic congestive heart failure ([place]) 01/10/2019  . Late effects of CVA (cerebrovascular accident) 04/06/2018  . Hyperlipidemia 06/19/2016  . Vascular dementia without behavioral disturbance ([place]) 06/08/2014  . Varicose veins of lower extremities with other complications 56/38/7564  . Essential hypertension   . Aortic insufficiency   . COPD (chronic obstructive pulmonary disease) ([place])   . Chronic atrial fibrillation ([place])   . Carotid bruit   . Warfarin anticoagulation     Orientation RESPIRATION BLADDER Height & Weight     Self  Normal Incontinent, External catheter Weight: 170 lb (77.1 kg) Height:  5\' 6"  (167.6 cm)  BEHAVIORAL SYMPTOMS/MOOD NEUROLOGICAL BOWEL NUTRITION STATUS      Continent Diet(cardiac diet, thin liquids, needs assistance)  AMBULATORY STATUS COMMUNICATION OF NEEDS Skin   Limited Assist Verbally Normal                       Personal Care Assistance Level of Assistance  Bathing, Feeding, Dressing, Total care Bathing  Assistance: Limited assistance Feeding assistance: Limited assistance Dressing Assistance: Limited assistance Total Care Assistance: Limited assistance   Functional Limitations Info  Sight, Hearing, Speech Sight Info: Adequate Hearing Info: Adequate Speech Info: Adequate    SPECIAL CARE FACTORS FREQUENCY  PT (By licensed PT), OT (By licensed OT)     PT Frequency: 5x/wk OT Frequency: 5x/wk            Contractures Contractures Info: Not present    Additional Factors Info  Code Status, Allergies Code Status Info: DNR Allergies Info: Codeine           Current Medications (05/15/2019):  This is the current hospital active medication list Current Facility-Administered Medications  Medication Dose Route Frequency Provider Last Rate Last Dose  . 0.9 %  sodium chloride infusion   Intravenous Continuous Karmen Bongo, MD 50 mL/hr at 05/15/19 1642    . acetaminophen (TYLENOL) tablet 650 mg  650 mg Oral Q4H PRN Karmen Bongo, MD       Or  . acetaminophen (TYLENOL) 160 MG/5ML solution 650 mg  650 mg Per Tube Q4H PRN Karmen Bongo, MD       Or  . acetaminophen (TYLENOL) suppository 650 mg  650 mg Rectal Q4H PRN Karmen Bongo, MD      . albuterol (PROVENTIL) (2.5 MG/3ML) 0.083% nebulizer solution 3 mL  3 mL Inhalation Q6H PRN Karmen Bongo, MD      . atorvastatin (LIPITOR) tablet 20 mg  20 mg Oral Daily Karmen Bongo, MD   20  mg at 05/15/19 0953  . donepezil (ARICEPT) tablet 10 mg  10 mg Oral QHS Jonah Blue, MD   10 mg at 05/14/19 2131   And  . memantine (NAMENDA XR) 24 hr capsule 28 mg  28 mg Oral Noemi Chapel, MD   28 mg at 05/14/19 2136  . heparin ADULT infusion 100 units/mL (25000 units/270mL sodium chloride 0.45%)  850 Units/hr Intravenous Continuous Dang, Thuy D, RPH 7.5 mL/hr at 05/15/19 1642 750 Units/hr at 05/15/19 1642  . hydrALAZINE (APRESOLINE) injection 5 mg  5 mg Intravenous Q6H PRN Dow Adolph N, DO      . hydrALAZINE (APRESOLINE) tablet 10 mg   10 mg Oral Q8H Hall, Carole N, DO   10 mg at 05/15/19 1140  . levothyroxine (SYNTHROID) tablet 12.5 mcg  12.5 mcg Oral QAC breakfast Jonah Blue, MD   12.5 mcg at 05/14/19 1844  . senna-docusate (Senokot-S) tablet 1 tablet  1 tablet Oral QHS PRN Jonah Blue, MD         Discharge Medications: Please see discharge summary for a list of discharge medications.  Relevant Imaging Results:  Relevant Lab Results:   Additional Information SSN: [id number]; COVID negative on 05/14/2019  Ashawn Rinehart B Makaria [place], LCSWA

## 2019-05-15 NOTE — Evaluation (Signed)
Occupational Therapy Evaluation Patient Details Name: Wanda Mckenzie MRN: [id number] DOB: 10-25-1935 Today's Date: 05/15/2019    History of Present Illness  Wanda Mckenzie is a 83 y.o. female with medical history significant of CVA; HTN; HLD; COPD; dementia; and afib presenting with AMS. Admitted for TIA CVA work up/seizure rule out evaluation. CT negative for acute abnormality; pending MRI. On heparin drip for acute left lower extremity DVT.    Clinical Impression   Pt PTA: unknown as pt with dementia. Pt currently limited by poor cognition with dementia, poor command follow, decreased mobility and decreased ability to care for self. Pt maxA for bed mobility. Pt with minA for UB ADL and maxA for LB ADL. Pt feeding self with maximal encouragement as pt took 3 bites and then was very disinterested. Pt would benefit from continued OT skilled services for ADL, mobility and safety in SNF setting. OT following acutely.    Follow Up Recommendations  SNF;Supervision/Assistance - 24 hour    Equipment Recommendations  Other (comment)(to be determined)    Recommendations for Other Services       Precautions / Restrictions Precautions Precautions: Fall Restrictions Weight Bearing Restrictions: No      Mobility Bed Mobility Overal bed mobility: Needs Assistance Bed Mobility: Supine to Sit;Sit to Supine     Supine to sit: Max assist Sit to supine: Max assist   General bed mobility comments: MaxA for initiation of leg/truncal movement  Transfers                 General transfer comment: deferred    Balance Overall balance assessment: Needs assistance Sitting-balance support: Feet supported Sitting balance-Leahy Scale: Fair Sitting balance - Comments: supervision for safety; no hand support when self feeding                                   ADL either performed or assessed with clinical judgement   ADL Overall ADL's : Needs  assistance/impaired Eating/Feeding: Set up;Sitting   Grooming: Minimal assistance;Sitting   Upper Body Bathing: Minimal assistance;Sitting   Lower Body Bathing: Maximal assistance;Sitting/lateral leans;Bed level   Upper Body Dressing : Minimal assistance;Sitting   Lower Body Dressing: Maximal assistance;Bed level   Toilet Transfer: Total assistance;+2 for physical assistance;+2 for safety/equipment   Toileting- Clothing Manipulation and Hygiene: Total assistance Toileting - Clothing Manipulation Details (indicate cue type and reason): purewick     Functional mobility during ADLs: Total assistance;+2 for physical assistance;+2 for safety/equipment General ADL Comments: Pt limited by poor cognition with dementia, poor command follow, decreased mobility and decreased ability to care for self.     Vision Baseline Vision/History: No visual deficits Vision Assessment?: Vision impaired- to be further tested in functional context Additional Comments: Pt unable to attend     Perception     Praxis      Pertinent Vitals/Pain Faces Pain Scale: No hurt     Hand Dominance     Extremity/Trunk Assessment Upper Extremity Assessment Upper Extremity Assessment: LUE deficits/detail;Generalized weakness;RUE deficits/detail RUE Deficits / Details: feeding self with, AROM, WFLs LUE Deficits / Details: moving spontaneously.   Lower Extremity Assessment Lower Extremity Assessment: Defer to PT evaluation       Communication Communication Communication: No difficulties   Cognition Arousal/Alertness: Awake/alert Behavior During Therapy: WFL for tasks assessed/performed Overall Cognitive Status: History of cognitive impairments - at baseline  General Comments: History of dementia; pt pleasantly confused, follows simple commands with multimodal cues and increased time, oriented to self only, not age or birthday.   General Comments        Exercises     Shoulder Instructions      Home Living Family/patient expects to be discharged to:: Skilled nursing facility                                 Additional Comments: Pt unable to provide home living set up      Prior Functioning/Environment Level of Independence: Needs assistance    ADL's / Homemaking Assistance Needed: likely requires assist with all ADL's   Comments: Lives with husband; 24 hour caregiver. Pt unable to provide PLOF        OT Problem List: Decreased activity tolerance;Decreased strength;Impaired balance (sitting and/or standing);Decreased safety awareness;Decreased coordination;Decreased cognition      OT Treatment/Interventions: Self-care/ADL training;Therapeutic exercise;Neuromuscular education;Energy conservation;DME and/or AE instruction;Therapeutic activities;Cognitive remediation/compensation;Patient/family education;Balance training    OT Goals(Current goals can be found in the care plan section) Acute Rehab OT Goals Patient Stated Goal: unable OT Goal Formulation: Patient unable to participate in goal setting Time For Goal Achievement: 05/29/19 Potential to Achieve Goals: Good ADL Goals Pt Will Perform Eating: with set-up;sitting Pt Will Perform Grooming: with supervision;sitting Pt Will Perform Lower Body Dressing: with min assist;sit to/from stand;sitting/lateral leans Pt Will Transfer to Toilet: stand pivot transfer;bedside commode;with mod assist Pt Will Perform Toileting - Clothing Manipulation and hygiene: with mod assist;sit to/from stand Pt/caregiver will Perform Home Exercise Program: Increased strength;Both right and left upper extremity Additional ADL Goal #1: Pt will attend to x5 mins of ADL tasks with 1-2 verbal cues for attention  OT Frequency: Min 2X/week   Barriers to D/C:            Co-evaluation PT/OT/SLP Co-Evaluation/Treatment: Yes Reason for Co-Treatment: Complexity of the patient's impairments  (multi-system involvement)   OT goals addressed during session: ADL's and self-care      AM-PAC OT "6 Clicks" Daily Activity     Outcome Measure Help from another person eating meals?: A Lot Help from another person taking care of personal grooming?: A Lot Help from another person toileting, which includes using toliet, bedpan, or urinal?: Total Help from another person bathing (including washing, rinsing, drying)?: A Lot Help from another person to put on and taking off regular upper body clothing?: A Lot Help from another person to put on and taking off regular lower body clothing?: Total 6 Click Score: 10   End of Session Nurse Communication: Mobility status  Activity Tolerance: Treatment limited secondary to medical complications (Comment) Patient left: in bed;with call bell/phone within reach;with bed alarm set  OT Visit Diagnosis: Unsteadiness on feet (R26.81);Muscle weakness (generalized) (M62.81)                Time: 8502-7741 OT Time Calculation (min): 24 min Charges:  OT General Charges $OT Visit: 1 Visit OT Evaluation $OT Eval Moderate Complexity: 1 Mod  Cristi Loron) Glendell Docker OTR/L Acute Rehabilitation Services Pager: [phone] Office: [phone]   Lonzo Cloud 05/15/2019, 4:32 PM

## 2019-05-15 NOTE — Progress Notes (Signed)
[place] for heparin Indication: Hx atrial fibrillation, acute DVT, possible acute CVA  Heparin Dosing Weight: 75 kg  Labs: Recent Labs    05/14/19 1230 05/15/19 0211  HGB 16.0* 14.0  HCT 47.9* 40.6  PLT 252 236  LABPROT 14.7  --   INR 1.2  --   HEPARINUNFRC  --  0.46  CREATININE 1.40*  --     Assessment: 73 yof on warfarin PTA for afib presenting with AMS, possible stroke, found to have acute LLE DVT. Pharmacy consulted to dose heparin. INR subtherapeutic on admit at 1.2, Hg 16, plt 252. CT head negative for bleed. No other active bleed issues documented.  Per discussion with Neurology, will dose heparin for lower goal with no boluses per acute CVA protocol with MRI still pending.  Heparin level therapeutic at 0.46  Goal of Therapy:  Heparin level 0.3-0.5 units/ml, no boluses Monitor platelets by anticoagulation protocol: Yes   Plan:  Continue heparin at 950 units/hr 8h heparin level Monitor daily heparin level and CBC, s/sx bleeding   Nicoletta Dress, PharmD PGY2 Infectious Disease Pharmacy Resident  05/15/2019 11:13 AM

## 2019-05-15 NOTE — Plan of Care (Signed)
Patient has not attempted to get out of bed without assistance during this shift. Will continue to monitor.

## 2019-05-16 ENCOUNTER — Inpatient Hospital Stay (HOSPITAL_COMMUNITY): Payer: Medicare HMO

## 2019-05-16 LAB — CBC
HCT: 40.5 % (ref 36.0–46.0)
Hemoglobin: 14 g/dL (ref 12.0–15.0)
MCH: 31.7 pg (ref 26.0–34.0)
MCHC: 34.6 g/dL (ref 30.0–36.0)
MCV: 91.8 fL (ref 80.0–100.0)
Platelets: 237 10*3/uL (ref 150–400)
RBC: 4.41 MIL/uL (ref 3.87–5.11)
RDW: 12.1 % (ref 11.5–15.5)
WBC: 8.5 10*3/uL (ref 4.0–10.5)
nRBC: 0 % (ref 0.0–0.2)

## 2019-05-16 LAB — COMPREHENSIVE METABOLIC PANEL
ALT: 20 U/L (ref 0–44)
AST: 25 U/L (ref 15–41)
Albumin: 3.2 g/dL — ABNORMAL LOW (ref 3.5–5.0)
Alkaline Phosphatase: 74 U/L (ref 38–126)
Anion gap: 10 (ref 5–15)
BUN: 14 mg/dL (ref 8–23)
CO2: 24 mmol/L (ref 22–32)
Calcium: 8.8 mg/dL — ABNORMAL LOW (ref 8.9–10.3)
Chloride: 107 mmol/L (ref 98–111)
Creatinine, Ser: 0.91 mg/dL (ref 0.44–1.00)
GFR calc Af Amer: >60 mL/min (ref >60)
GFR calc non Af Amer: 60 mL/min — ABNORMAL LOW (ref >60)
Glucose, Bld: 97 mg/dL (ref 70–99)
Potassium: 3.1 mmol/L — ABNORMAL LOW (ref 3.5–5.1)
Sodium: 141 mmol/L (ref 135–145)
Total Bilirubin: 1.6 mg/dL — ABNORMAL HIGH (ref 0.3–1.2)
Total Protein: 6 g/dL — ABNORMAL LOW (ref 6.5–8.1)

## 2019-05-16 LAB — ECHOCARDIOGRAM COMPLETE
Height: 66 in
Weight: 2720 oz

## 2019-05-16 LAB — HEPARIN LEVEL (UNFRACTIONATED): Heparin Unfractionated: 0.6 IU/mL (ref 0.30–0.70)

## 2019-05-16 MED ORDER — HEPARIN (PORCINE) 25000 UT/250ML-% IV SOLN
850.0000 [IU]/h | INTRAVENOUS | Status: AC
  Administered 2019-05-16: 850 [IU]/h via INTRAVENOUS

## 2019-05-16 MED ORDER — INFLUENZA VAC A&B SA ADJ QUAD 0.5 ML IM PRSY
0.5000 mL | PREFILLED_SYRINGE | INTRAMUSCULAR | Status: AC
  Administered 2019-05-17: 10:00:00 0.5 mL via INTRAMUSCULAR
  Filled 2019-05-16: qty 0.5

## 2019-05-16 MED ORDER — METOPROLOL TARTRATE 25 MG PO TABS
25.0000 mg | ORAL_TABLET | Freq: Two times a day (BID) | ORAL | Status: DC
  Administered 2019-05-16: 05:00:00 25 mg via ORAL
  Filled 2019-05-16 (×2): qty 1

## 2019-05-16 MED ORDER — HYDRALAZINE HCL 50 MG PO TABS
50.0000 mg | ORAL_TABLET | Freq: Three times a day (TID) | ORAL | Status: DC
  Administered 2019-05-16 (×2): 50 mg via ORAL
  Filled 2019-05-16 (×2): qty 1

## 2019-05-16 MED ORDER — APIXABAN 5 MG PO TABS
10.0000 mg | ORAL_TABLET | Freq: Two times a day (BID) | ORAL | Status: AC
  Administered 2019-05-16 – 2019-05-22 (×13): 10 mg via ORAL
  Filled 2019-05-16 (×14): qty 2

## 2019-05-16 MED ORDER — APIXABAN 5 MG PO TABS: 5.0000 mg | ORAL_TABLET | Freq: Two times a day (BID) | ORAL | Status: DC

## 2019-05-16 MED ORDER — APIXABAN 5 MG PO TABS
5.0000 mg | ORAL_TABLET | Freq: Two times a day (BID) | ORAL | Status: DC
  Administered 2019-05-23 – 2019-05-26 (×7): 5 mg via ORAL
  Filled 2019-05-16 (×7): qty 1

## 2019-05-16 MED ORDER — METOPROLOL TARTRATE 50 MG PO TABS
50.0000 mg | ORAL_TABLET | Freq: Two times a day (BID) | ORAL | Status: DC
  Administered 2019-05-16 (×2): 50 mg via ORAL
  Filled 2019-05-16 (×2): qty 1

## 2019-05-16 MED ORDER — ENSURE ENLIVE PO LIQD
237.0000 mL | Freq: Three times a day (TID) | ORAL | Status: DC
  Administered 2019-05-16 – 2019-05-26 (×23): 237 mL via ORAL

## 2019-05-16 MED ORDER — POTASSIUM CHLORIDE CRYS ER 20 MEQ PO TBCR
40.0000 meq | EXTENDED_RELEASE_TABLET | Freq: Two times a day (BID) | ORAL | Status: AC
  Administered 2019-05-16 (×2): 40 meq via ORAL
  Filled 2019-05-16 (×2): qty 2

## 2019-05-16 MED ORDER — APIXABAN 5 MG PO TABS: 10.0000 mg | ORAL_TABLET | Freq: Two times a day (BID) | ORAL | Status: DC

## 2019-05-16 MED ORDER — HYDRALAZINE HCL 50 MG PO TABS
100.0000 mg | ORAL_TABLET | Freq: Three times a day (TID) | ORAL | Status: DC
  Administered 2019-05-16 – 2019-05-26 (×30): 100 mg via ORAL
  Filled 2019-05-16 (×31): qty 2

## 2019-05-16 NOTE — Evaluation (Signed)
Speech Language Pathology Evaluation Patient Details Name: ROCHEL PRIVETT MRN: [id number] DOB: 12/24/1935 Today's Date: 05/16/2019 Time: 7209-4709 SLP Time Calculation (min) (ACUTE ONLY): 25 min  Problem List:  Patient Active Problem List   Diagnosis Date Noted  . AMS (altered mental status) 05/14/2019  . Left leg DVT (HCC) 05/14/2019  . Stage 3b chronic kidney disease 05/14/2019  . Elevated TSH 04/13/2019  . Chronic congestive heart failure (HCC) 01/10/2019  . Late effects of CVA (cerebrovascular accident) 04/06/2018  . Hyperlipidemia 06/19/2016  . Vascular dementia without behavioral disturbance (HCC) 06/08/2014  . Varicose veins of lower extremities with other complications 02/02/2013  . Essential hypertension   . Aortic insufficiency   . COPD (chronic obstructive pulmonary disease) (HCC)   . Chronic atrial fibrillation (HCC)   . Carotid bruit   . Warfarin anticoagulation    Past Medical History:  Past Medical History:  Diagnosis Date  . Atrial fibrillation (HCC)   . COPD (chronic obstructive pulmonary disease) (HCC)   . Dyslipidemia   . Essential hypertension   . History of cardiac catheterization 11/2013   Normal coronaries  . History of stroke    Right MCA distribution    Past Surgical History:  Past Surgical History:  Procedure Laterality Date  . ABDOMINAL HYSTERECTOMY  1975  . FRACTURE SURGERY Left   . LEFT HEART CATHETERIZATION WITH CORONARY ANGIOGRAM N/A 11/23/2013   Procedure: LEFT HEART CATHETERIZATION WITH CORONARY ANGIOGRAM;  Surgeon: Kathleene Hazel, MD;  Location: [facility] CATH LAB;  Service: Cardiovascular;  Laterality: N/A;  . LEFT HIP HEMIARTHROPLASTY    . TRIBUTARY VARICOSITIES OF RIGHT LEG  05/23/2002  . VEIN LIGATION AND STRIPPING Right 02/08/2013   Procedure: Segmental excision of painful varicose veins right lower extremity;  Surgeon: Chuck Hint, MD;  Location: [facility] OR;  Service: Vascular;  Laterality: Right;   HPI:  83 yo female adm  to [facility] with AMS,  Pt MRI negative for acute changes, remote ischemic changes evident.  Pt PMH + for CVA, HTN, HLD and has a caregiver to help.  Pt is a DNR.  Speech evaluation ordered, pt passed Yale swallow screen.   Assessment / Plan / Recommendation Clinical Impression  Patient presents with severe cogntive linguistic deficits that are likely baseline.  She is able to state her name but not birthday, location or situation.  Pt only spoke a few times during session - with fluent language but often not coorelated to situation.  She demonstrates decreased initiation with dental brushing and consumption of breakfast - requiring hand over hand assist with severe delays in responses.  Immediately post-swallow, pt states "That's enough of that" which was appropriate to situation.  She will benefit from 24/7 supervision due to her dementia impacting ability to conduct ADLs.  No SLP follow up indicated as pt for SNF per recommendations from PT/OT.    SLP Assessment  SLP Recommendation/Assessment: Patient does not need any further Speech Lanaguage Pathology Services SLP Visit Diagnosis: Cognitive communication deficit (R41.841)    Follow Up Recommendations  None    Frequency and Duration   n/a        SLP Evaluation Cognition  Orientation Level: Oriented to person;Disoriented to place;Disoriented to time;Disoriented to situation Attention: Sustained Sustained Attention: Appears intact Memory: Impaired Memory Impairment: Storage deficit;Retrieval deficit Awareness: Impaired Awareness Impairment: Intellectual impairment Problem Solving: Impaired Problem Solving Impairment: Functional basic Safety/Judgment: Impaired       Comprehension  Auditory Comprehension Overall Auditory Comprehension: Impaired Yes/No Questions: Impaired Basic  Biographical Questions: 0-25% accurate Commands: Impaired One Step Basic Commands: 0-24% accurate Conversation: Simple Interfering Components:  Attention;Processing speed Visual Recognition/Discrimination Discrimination: Not tested Reading Comprehension Reading Status: Impaired Word level: (did not identify name)    Expression Expression Primary Mode of Expression: Verbal Verbal Expression Overall Verbal Expression: Appears within functional limits for tasks assessed Initiation: No impairment Repetition: Impaired Level of Impairment: Word level(able to repeat at single word level and phrase level intermittently) Naming: No impairment Pragmatics: No impairment Non-Verbal Means of Communication: Not applicable Written Expression Dominant Hand: Right Written Expression: Not tested   Oral / Motor  Oral Motor/Sensory Function Overall Oral Motor/Sensory Function: Other (comment)(pt did not follow directions for oral motor exam, able to seal lips on straw) Motor Speech Overall Motor Speech: Appears within functional limits for tasks assessed Respiration: Within functional limits Phonation: Normal Resonance: Within functional limits Articulation: Within functional limitis Intelligibility: Intelligible Motor Planning: Witnin functional limits   GO                    Macario Golds 05/16/2019, 8:53 AM   Luanna Salk, MS [place] Pager [phone] Office [phone]

## 2019-05-16 NOTE — Progress Notes (Signed)
EEG Completed; Results Pending  

## 2019-05-16 NOTE — Progress Notes (Addendum)
Initial Nutrition Assessment  DOCUMENTATION CODES:   Not applicable  INTERVENTION:  Ensure Enlive TID (each supplement provides 350 calories and 20 grams of protein) MVI daily  NUTRITION DIAGNOSIS:   Inadequate oral intake related to lethargy/confusion(dementia) as evidenced by per patient/family report, meal completion < 50%.  GOAL:   Patient will meet greater than or equal to 90% of their needs  MONITOR:   PO intake, Supplement acceptance, Weight trends, Labs, Skin, I & O's  REASON FOR ASSESSMENT:   Consult Assessment of nutrition requirement/status  ASSESSMENT:   83 y.o. female with medical history significant of CVA; HTN; HLD; COPD; dementia; and afib admitted with AMS  Pt was speaking incoherently at time of assessment, history obtained from husband at bedside, it should be noted that husband is a poor historian.  Husband reports that pt has a good appetite at home but has not eaten anything for about 3 weeks PTA. This history is questionable as husband gave conflicting information on interview. Endorses that pt sleeps most of the day, and has not taken her medications for several days. When discussing supplements with husband, he confirmed that pt enjoys strawberry flavored drinks.  Case discussed with RN who reported that pt ate only a few bites at breakfast, and that pt began to refuse food shortly after starting to eat.  Upon Nutrition Focused Physical Exam, mild muscle wasting of dorsal hand, clavicle, and patellar regions noted, specific to the left side. Per documented wt history, weight has been trending downward from 04/14/19. Pt is -5% body weight in past two months which is not significant for time frame.  Labs and medications reviewed.  NUTRITION - FOCUSED PHYSICAL EXAM:    Most Recent Value  Orbital Region  Mild depletion  Upper Arm Region  No depletion  Thoracic and Lumbar Region  No depletion  Buccal Region  No depletion  Temple Region  Moderate  depletion  Clavicle Bone Region  No depletion  Clavicle and Acromion Bone Region  No depletion  Scapular Bone Region  No depletion  Dorsal Hand  Mild depletion  Patellar Region  No depletion  Anterior Thigh Region  No depletion  Posterior Calf Region  No depletion  Edema (RD Assessment)  Mild  Hair  Reviewed  Eyes  Reviewed  Mouth  Reviewed  Skin  Reviewed  Nails  Reviewed       Diet Order:   Diet Order            Diet Heart Fluid consistency: Thin  Diet effective ____              EDUCATION NEEDS:   Not appropriate for education at this time  Skin:  Skin Assessment: Reviewed RN Assessment  Last BM:  unknown  Height:   Ht Readings from Last 1 Encounters:  05/14/19 5\' 6"  (1.676 m)    Weight:   Wt Readings from Last 1 Encounters:  05/14/19 77.1 kg    Ideal Body Weight:  59 kg  BMI:  Body mass index is 27.44 kg/m.  Estimated Nutritional Needs:   Kcal:  1600-1800  Protein:  80-90 grams  Fluid:  >/= 1.5 L  Meda Klinefelter, Dietetic Intern

## 2019-05-16 NOTE — Progress Notes (Signed)
ANTICOAGULATION CONSULT NOTE  Pharmacy Consult:  Heparin Indication: Hx atrial fibrillation and acute DVT  Heparin Dosing Weight: 75 kg  Labs: Recent Labs    05/14/19 1230  05/15/19 0211 05/15/19 1530 05/15/19 1616 05/16/19 0411  HGB 16.0*  --  14.0  --   --  14.0  HCT 47.9*  --  40.6  --   --  40.5  PLT 252  --  236  --   --  237  LABPROT 14.7  --   --   --   --   --   INR 1.2  --   --   --   --   --   HEPARINUNFRC  --    < > 0.46 0.86* 0.54 0.60  CREATININE 1.40*  --   --   --   --  0.91   < > = values in this interval not displayed.    Assessment: 83 yo female h/o Afib and found to have acute LLE DVT, Coumadin on hold, for heparin. MRI Head 11/21 notes no evidence for acute infarction  Goal of Therapy:  Heparin level 0.3-0.7 units/ml Monitor platelets by anticoagulation protocol: Yes   Plan:  Continue Heparin at current rate    Phillis Knack, PharmD, BCPS

## 2019-05-16 NOTE — Progress Notes (Signed)
PROGRESS NOTE  Wanda CeoMyrtle J Mcwherter ZOX:[id number] DOB: [birth year] DOA: 05/14/2019 PCP: Gwenlyn FudgeJoyce, Britney F, FNP  HPI/Recap of past 24 hours: HPI: Wanda Mckenzie is a 83 y.o. female with medical history significant of CVA; HTN; HLD; COPD; dementia; and afib presenting with AMS.  Patient has advanced dementia and is unable to answer questions.  I called and discussed the patient's husband.  He reports that a caregiver was there when episode happened.  Sat and ate a few bites, looked like she was walking to fall out of the chair and so they laid her in the floor and called 911.  She did not lose consciousness.  She has been refusing her medications some, would not eat or take meds the last few days.     ED Course:  Dementia, presenting with AMS, ?history.  Slumped over, lost control of bowel/bladder.  Back to baseline upon EMS arrival.  ?LLE weakness. LLE pain, +DVT despite Coumadin for afib (INR subtherapeutic).  ?CVA/TIA, seizure.  Neurology will consult.  11/22: Patient was seen and examined at her bedside this morning.  She is alert but confused in a state of dementia.  She does not follow commands however she is able to move her upper extremities bilaterally.  On heparin drip for acute left lower extremity DVT.  Home Coumadin on hold.  Will consider switching to direct oral anticoagulation like Eliquis.  05/16/19: Patient was seen and examined at her bedside this morning.  She is more alert today and interactive.  No new complaints.  Assessment/Plan: Principal Problem:   AMS (altered mental status) Active Problems:   COPD (chronic obstructive pulmonary disease) (HCC)   Chronic atrial fibrillation (HCC)   Essential hypertension   Hyperlipidemia   Vascular dementia without behavioral disturbance (HCC)   Chronic congestive heart failure (HCC)   Left leg DVT (HCC)   Stage 3b chronic kidney disease   Acute metabolic encephalopathy in the context of dementia/TIA CVA work-up/seizure rule out  evaluation Presented from home where she was slumped over and was incontinent of bowel and bladder Differential diagnosis includes seizure versus TIA/stroke versus syncope versus transient alteration of consciousness in the context of advanced dementia Subtherapeutic INR, 1.2 in the setting of chronic A. fib on Coumadin with medication noncompliance. MRI brain without contrast and MRA neck without contrast no sign of acute CVA 2D echo LVEF 40-45% with diffused global hypokinesis EEG no evidence of seizure activity  Newly diagnosed acute left lower extremity DVT Coumadin held, was on hep drip>>11/23 Switch to eliquis on 05/16/19   Uncontrolled hypertension Normalize blood pressure Blood pressures not at goal Losartan on hold due to AKI Increased dose of p.o. Lopressor 25 mg twice daily Added p.o. hydralazine 50 mg 3 times daily Increased dose of p.o. hydralazine to 75 mg 3 times daily Continue to closely monitor vital signs As needed IV hydralazine 5 mg every 4 hours as needed for SBP greater than 160  Resolved AKI on CKD 2, likely prerenal in the setting of dehydration. Baseline creatinine appears to be 1.1 with GFR of 47 Presented with creatinine of 1.4 with GFR of 35 Creatinine 0.9 with GFR of 60 on 05/16/2019 Continue to avoid nephrotoxins  Chronic A. fib on Coumadin Presented with INR 1.2 Goal INR between 2 and 3: On Coumadin Coumadin has been on hold Switch to Eliquis on 05/16/2019  Isolated elevated bilirubin Alkaline phosphatase,  AST ALT normal T bili 1.6 from 1.7  Dementia Reorient as needed Continue fall precautions  Uncontrolled hypertension Ongoing permissive hypertension Blood pressure medications on hold Gradually restart meds Continue to closely monitor  Hyperlipidemia LDL 85 on 05/16/2019 Continue Lipitor 20 mg daily  Hypothyroidism Continue levothyroxine  Physical debility PT OT assessment recommended SNF CSW assisting with SNF  placement Continue PT OT with assistance and fall precautions    DVT prophylaxis:Heparin drip Code Status:DNR - confirmed with family Family Communication:None at bedside.  Will contact family.  Disposition Plan:Possible discharge to SNF in the next 24 to 48 hours pending placement.  Consults called:Neurology; PT/OT/ST/SW/Nutrition    Objective: Vitals:   05/15/19 2310 05/16/19 0436 05/16/19 0840 05/16/19 1214  BP: (!) 193/130 (!) 216/82 (!) 185/76 (!) 193/81  Pulse: 67 63 60   Resp: 20 15 16  (!) 24  Temp: 97.8 F (36.6 C)  97.6 F (36.4 C) 97.9 F (36.6 C)  TempSrc: Oral  Axillary Axillary  SpO2: 96% 96% 100% 97%  Weight:      Height:        Intake/Output Summary (Last 24 hours) at 05/16/2019 1326 Last data filed at 05/16/2019 0500 Gross per 24 hour  Intake 1275.51 ml  Output 1200 ml  Net 75.51 ml   Filed Weights   05/14/19 1240  Weight: 77.1 kg    Exam:   General: 83 y.o. year-old female pleasant well-developed well-nourished in no acute distress.  Alert and interactive in the setting of dementia.    Cardiovascular: Regular rate and rhythm no rubs or gallops no JVD or thyromegaly noted.  Respiratory: Clear to auscultation no wheezes no rales.  Abdomen: Soft nontender nondistended with bowel sounds present.    Musculoskeletal: Mild left lower extremity edema. Psychiatry: Mood is appropriate for condition and setting.  Data Reviewed: CBC: Recent Labs  Lab 05/14/19 1230 05/15/19 0211 05/16/19 0411  WBC 11.6* 7.9 8.5  HGB 16.0* 14.0 14.0  HCT 47.9* 40.6 40.5  MCV 95.4 91.9 91.8  PLT 252 236 237   Basic Metabolic Panel: Recent Labs  Lab 05/14/19 1230 05/16/19 0411  NA 142 141  K 3.8 3.1*  CL 104 107  CO2 23 24  GLUCOSE 134* 97  BUN 22 14  CREATININE 1.40* 0.91  CALCIUM 9.3 8.8*   GFR: Estimated Creatinine Clearance: 51.7 mL/min (by C-G formula based on SCr of 0.91 mg/dL). Liver Function Tests: Recent Labs  Lab 05/14/19 1230  05/16/19 0411  AST 33 25  ALT 25 20  ALKPHOS 93 74  BILITOT 1.7* 1.6*  PROT 6.9 6.0*  ALBUMIN 4.0 3.2*   No results for input(s): LIPASE, AMYLASE in the last 168 hours. No results for input(s): AMMONIA in the last 168 hours. Coagulation Profile: Recent Labs  Lab 05/14/19 1230  INR 1.2   Cardiac Enzymes: No results for input(s): CKTOTAL, CKMB, CKMBINDEX, TROPONINI in the last 168 hours. BNP (last 3 results) No results for input(s): PROBNP in the last 8760 hours. HbA1C: Recent Labs    05/15/19 0211  HGBA1C 5.5   CBG: Recent Labs  Lab 05/14/19 1251  GLUCAP 130*   Lipid Profile: Recent Labs    05/15/19 0211  CHOL 126  HDL 28*  LDLCALC 85  TRIG 64  CHOLHDL 4.5   Thyroid Function Tests: No results for input(s): TSH, T4TOTAL, FREET4, T3FREE, THYROIDAB in the last 72 hours. Anemia Panel: No results for input(s): VITAMINB12, FOLATE, FERRITIN, TIBC, IRON, RETICCTPCT in the last 72 hours. Urine analysis:    Component Value Date/Time   COLORURINE YELLOW 05/14/2019 1633   APPEARANCEUR HAZY (A)  05/14/2019 1633   APPEARANCEUR Clear 02/17/2019 1154   LABSPEC 1.014 05/14/2019 1633   PHURINE 5.0 05/14/2019 1633   GLUCOSEU NEGATIVE 05/14/2019 1633   HGBUR SMALL (A) 05/14/2019 1633   BILIRUBINUR NEGATIVE 05/14/2019 1633   BILIRUBINUR Negative 02/17/2019 1154   KETONESUR NEGATIVE 05/14/2019 1633   PROTEINUR NEGATIVE 05/14/2019 1633   UROBILINOGEN 1.0 02/08/2013 0911   NITRITE NEGATIVE 05/14/2019 1633   LEUKOCYTESUR NEGATIVE 05/14/2019 1633   Sepsis Labs: @LABRCNTIP (procalcitonin:4,lacticidven:4)  ) Recent Results (from the past 240 hour(s))  Urine culture     Status: Abnormal   Collection Time: 05/14/19 12:58 PM   Specimen: Urine, Clean Catch  Result Value Ref Range Status   Specimen Description URINE, CLEAN CATCH  Final   Special Requests   Final    NONE Performed at [facility], [address], [place], [place]    Culture MULTIPLE SPECIES  PRESENT, SUGGEST RECOLLECTION (A)  Final   Report Status 05/15/2019 FINAL  Final  SARS CORONAVIRUS 2 (TAT 6-24 HRS) Nasopharyngeal Nasopharyngeal Swab     Status: None   Collection Time: 05/14/19  3:35 PM   Specimen: Nasopharyngeal Swab  Result Value Ref Range Status   SARS Coronavirus 2 NEGATIVE NEGATIVE Final    Comment: (NOTE) SARS-CoV-2 target nucleic acids are NOT DETECTED. The SARS-CoV-2 RNA is generally detectable in upper and lower respiratory specimens during the acute phase of infection. Negative results do not preclude SARS-CoV-2 infection, do not rule out co-infections with other pathogens, and should not be used as the sole basis for treatment or other patient management decisions. Negative results must be combined with clinical observations, patient history, and epidemiological information. The expected result is Negative. Fact Sheet for Patients: 05/16/19 Fact Sheet for Healthcare Providers: HairSlick.no This test is not yet approved or cleared by the quierodirigir.com FDA and  has been authorized for detection and/or diagnosis of SARS-CoV-2 by FDA under an Emergency Use Authorization (EUA). This EUA will remain  in effect (meaning this test can be used) for the duration of the COVID-19 declaration under Section 56 4(b)(1) of the Act, 21 U.S.C. section 360bbb-3(b)(1), unless the authorization is terminated or revoked sooner. Performed at [facility], [address], [place], [place]       Studies: Mr Angio Neck Wo Contrast  Result Date: 05/15/2019 CLINICAL DATA:  83 year old female with dementia and atrial fibrillation presenting with altered mental status. EXAM: MRI HEAD WITHOUT CONTRAST MRA NECK WITHOUT CONTRAST TECHNIQUE: Multiplanar, multiecho pulse sequences of the brain and surrounding structures were obtained without intravenous contrast. Angiographic images of the neck were  obtained using MRA technique with and without intravenous contrast. Carotid stenosis measurements (when applicable) are obtained utilizing NASCET criteria, using the distal internal carotid diameter as the denominator. CONTRAST:  None. COMPARISON:  CT head 05/14/2019. Remote MR head 12/03/2005 FINDINGS: MRI HEAD FINDINGS Patient was uncooperative. Some series are degraded by motion or could not can be completed. Brain: No evidence for acute infarction, hemorrhage, mass lesion, hydrocephalus, or extra-axial fluid. Generalized atrophy. Remote RIGHT frontal infarct/hematoma, with chronic blood products layering the encephalomalacia cavity. Also remote RIGHT occipital infarct is noted. Both these abnormalities have been present since 2007. T2 and FLAIR hyperintensities in the white matter favored to represent chronic microvascular ischemic change. Vascular: Flow voids are maintained. Skull and upper cervical spine: Normal marrow signal. Mild pannus. Sinuses/Orbits: No acute findings. Other: None. MRA NECK FINDINGS Noncontrast examination was performed. Conventional branching of the great  vessels from the arch without visible proximal stenosis. Carotid bifurcations demonstrate no flow reducing lesion. Posterior wall plaque at the LEFT carotid bifurcation is suggested, without findings to suggest greater than 50% stenosis. BILATERAL vertebral arteries are patent, RIGHT dominant. IMPRESSION: 1. No acute intracranial findings. Atrophy and small vessel disease. Remote ischemic changes as described. 2. No extracranial or intracranial flow reducing lesion is evident. Electronically Signed   By: Staci Righter M.D.   On: 05/15/2019 13:58   Mr Brain Wo Contrast  Result Date: 05/15/2019 CLINICAL DATA:  83 year old female with dementia and atrial fibrillation presenting with altered mental status. EXAM: MRI HEAD WITHOUT CONTRAST MRA NECK WITHOUT CONTRAST TECHNIQUE: Multiplanar, multiecho pulse sequences of the brain and  surrounding structures were obtained without intravenous contrast. Angiographic images of the neck were obtained using MRA technique with and without intravenous contrast. Carotid stenosis measurements (when applicable) are obtained utilizing NASCET criteria, using the distal internal carotid diameter as the denominator. CONTRAST:  None. COMPARISON:  CT head 05/14/2019. Remote MR head 12/03/2005 FINDINGS: MRI HEAD FINDINGS Patient was uncooperative. Some series are degraded by motion or could not can be completed. Brain: No evidence for acute infarction, hemorrhage, mass lesion, hydrocephalus, or extra-axial fluid. Generalized atrophy. Remote RIGHT frontal infarct/hematoma, with chronic blood products layering the encephalomalacia cavity. Also remote RIGHT occipital infarct is noted. Both these abnormalities have been present since 2007. T2 and FLAIR hyperintensities in the white matter favored to represent chronic microvascular ischemic change. Vascular: Flow voids are maintained. Skull and upper cervical spine: Normal marrow signal. Mild pannus. Sinuses/Orbits: No acute findings. Other: None. MRA NECK FINDINGS Noncontrast examination was performed. Conventional branching of the great vessels from the arch without visible proximal stenosis. Carotid bifurcations demonstrate no flow reducing lesion. Posterior wall plaque at the LEFT carotid bifurcation is suggested, without findings to suggest greater than 50% stenosis. BILATERAL vertebral arteries are patent, RIGHT dominant. IMPRESSION: 1. No acute intracranial findings. Atrophy and small vessel disease. Remote ischemic changes as described. 2. No extracranial or intracranial flow reducing lesion is evident. Electronically Signed   By: Staci Righter M.D.   On: 05/15/2019 13:58    Scheduled Meds:  atorvastatin  20 mg Oral Daily   donepezil  10 mg Oral QHS   And   memantine  28 mg Oral QHS   hydrALAZINE  50 mg Oral Q8H   levothyroxine  12.5 mcg Oral QAC  breakfast   metoprolol tartrate  25 mg Oral BID   potassium chloride  40 mEq Oral BID WC    Continuous Infusions:  sodium chloride 50 mL/hr at 05/16/19 0540   heparin 850 Units/hr (05/16/19 0300)     LOS: 1 day     Kayleen Memos, MD [facility] Pager [phone]  If 7PM-7AM, please contact night-coverage [email or web address] Password [email or web address] 05/16/2019, 1:26 PM

## 2019-05-16 NOTE — TOC Progression Note (Signed)
Transition of Care [facility]) - Progression Note    Patient Details  Name: Wanda Mckenzie MRN: [id number] Date of Birth: 1935-09-05  [facility]) CM/SW Contact  Pollie Friar, RN Phone Number: 05/16/2019, 3:37 PM  Clinical Narrative:    CM reached out to patient's son about SNF choices. He is not happy with the 2 facilities that have offered beds. TOC has reached out to the 2 pending facilities to see if they can offer a bed.  Son to send POA paper work to [facility] for the chart.    Expected Discharge Plan: Skilled Nursing Facility Barriers to Discharge: Ship broker, Continued Medical Work up  Expected Discharge Plan and Services Expected Discharge Plan: [place] In-house Referral: Clinical Social Work Discharge Planning Services: NA Post Acute Care Choice: [place] Living arrangements for the past 2 months: Single Family Home                 DME Arranged: N/A DME Agency: NA                   Social Determinants of Health (SDOH) Interventions    Readmission Risk Interventions No flowsheet data found.

## 2019-05-16 NOTE — TOC Progression Note (Signed)
Transition of Care [facility]) - Progression Note    Patient Details  Name: SHELIA KINGSBERRY MRN: [id number] Date of Birth: 1935/11/09  Transition of Care [facility]) CM/SW [place], [place] Phone Number: 05/16/2019, 11:24 AM  Clinical Narrative:     CSW called and made APS report with [place].   Expected Discharge Plan: Skilled Nursing Facility Barriers to Discharge: Ship broker, Continued Medical Work up  Expected Discharge Plan and Services Expected Discharge Plan: [place] In-house Referral: Clinical Social Work Discharge Planning Services: NA Post Acute Care Choice: [place] Living arrangements for the past 2 months: Single Family Home                 DME Arranged: N/A DME Agency: NA                   Social Determinants of Health (SDOH) Interventions    Readmission Risk Interventions No flowsheet data found.

## 2019-05-16 NOTE — Plan of Care (Signed)
Patient stated "I am not in any pain today" during 8am morning assessment.

## 2019-05-16 NOTE — Discharge Instructions (Addendum)
Deep Vein Thrombosis  Deep vein thrombosis (DVT) is a condition in which a blood clot forms in a deep vein, such as a lower leg, thigh, or arm vein. A clot is blood that has thickened into a gel or solid. This condition is dangerous. It can lead to serious and even life-threatening complications if the clot travels to the lungs and causes a blockage (pulmonary embolism). It can also damage veins in the leg. This can result in leg pain, swelling, discoloration, and sores (post-thrombotic syndrome). What are the causes? This condition may be caused by:  A slowdown of blood flow.  Damage to a vein.  A condition that causes blood to clot more easily, such as an inherited clotting disorder. What increases the risk? The following factors may make you more likely to develop this condition:  Being overweight.  Being older, especially over age 62.  Sitting or lying down for more than four hours.  Being in the hospital.  Lack of physical activity (sedentary lifestyle).  Pregnancy, being in childbirth, or having recently given birth.  Taking medicines that contain estrogen, such as medicines to prevent pregnancy.  Smoking.  A history of any of the following: ? Blood clots or a blood clotting disease. ? Peripheral vascular disease. ? Inflammatory bowel disease. ? Cancer. ? Heart disease. ? Genetic conditions that affect how your blood clots, such as Factor V Leiden mutation. ? Neurological diseases that affect your legs (leg paresis). ? A recent injury, such as a car accident. ? Major or lengthy surgery. ? A central line placed inside a large vein. What are the signs or symptoms? Symptoms of this condition include:  Swelling, pain, or tenderness in an arm or leg.  Warmth, redness, or discoloration in an arm or leg. If the clot is in your leg, symptoms may be more noticeable or worse when you stand or walk. Some people may not develop any symptoms. How is this diagnosed? This  condition is diagnosed with:  A medical history and physical exam.  Tests, such as: ? Blood tests. These are done to check how well your blood clots. ? Ultrasound. This is done to check for clots. ? Venogram. For this test, contrast dye is injected into a vein and X-rays are taken to check for any clots. How is this treated? Treatment for this condition depends on:  The cause of your DVT.  Your risk for bleeding or developing more clots.  Any other medical conditions that you have. Treatment may include:  Taking a blood thinner (anticoagulant). This type of medicine prevents clots from forming. It may be taken by mouth, injected under the skin, or injected through an IV (catheter).  Injecting clot-dissolving medicines into the affected vein (catheter-directed thrombolysis).  Having surgery. Surgery may be done to: ? Remove the clot. ? Place a filter in a large vein to catch blood clots before they reach the lungs. Some treatments may be continued for up to six months. Follow these instructions at home: If you are taking blood thinners:  Take the medicine exactly as told by your health care provider. Some blood thinners need to be taken at the same time every day. Do not skip a dose.  Talk with your health care provider before you take any medicines that contain aspirin or NSAIDs. These medicines increase your risk for dangerous bleeding.  Ask your health care provider about foods and drugs that could change the way the medicine works (may interact). Avoid those things if  your health care provider tells you to do so.  Blood thinners can cause easy bruising and may make it difficult to stop bleeding. Because of this: ? Be very careful when using knives, scissors, or other sharp objects. ? Use an electric razor instead of a blade. ? Avoid activities that could cause injury or bruising, and follow instructions about how to prevent falls.  Wear a medical alert bracelet or carry a  card that lists what medicines you take. General instructions  Take over-the-counter and prescription medicines only as told by your health care provider.  Return to your normal activities as told by your health care provider. Ask your health care provider what activities are safe for you.  Wear compression stockings if recommended by your health care provider.  Keep all follow-up visits as told by your health care provider. This is important. How is this prevented? To lower your risk of developing this condition again:  For 30 or more minutes every day, do an activity that: ? Involves moving your arms and legs. ? Increases your heart rate.  When traveling for longer than four hours: ? Exercise your arms and legs every hour. ? Drink plenty of water. ? Avoid drinking alcohol.  Avoid sitting or lying for a long time without moving your legs.  If you have surgery or you are hospitalized, ask about ways to prevent blood clots. These may include taking frequent walks or using anticoagulants.  Stay at a healthy weight.  If you are a woman who is older than age 88, avoid unnecessary use of medicines that contain estrogen, such as some birth control pills.  Do not use any products that contain nicotine or tobacco, such as cigarettes and e-cigarettes. This is especially important if you take estrogen medicines. If you need help quitting, ask your health care provider. Contact a health care provider if:  You miss a dose of your blood thinner.  Your menstrual period is heavier than usual.  You have unusual bruising. Get help right away if:  You have: ? New or increased pain, swelling, or redness in an arm or leg. ? Numbness or tingling in an arm or leg. ? Shortness of breath. ? Chest pain. ? A rapid or irregular heartbeat. ? A severe headache or confusion. ? A cut that will not stop bleeding.  There is blood in your vomit, stool, or urine.  You have a serious fall or accident,  or you hit your head.  You feel light-headed or dizzy.  You cough up blood. These symptoms may represent a serious problem that is an emergency. Do not wait to see if the symptoms will go away. Get medical help right away. Call your local emergency services (911 in the [place] Do not drive yourself to the hospital. Summary  Deep vein thrombosis (DVT) is a condition in which a blood clot forms in a deep vein, such as a lower leg, thigh, or arm vein.  Symptoms can include swelling, warmth, pain, and redness in your leg or arm.  This condition may be treated with a blood thinner (anticoagulant medicine), medicine that is injected to dissolve blood clots,compression stockings, or surgery.  If you are prescribed blood thinners, take them exactly as told. This information is not intended to replace advice given to you by your health care provider. Make sure you discuss any questions you have with your health care provider. Document Released: 06/09/2005 Document Revised: 05/22/2017 Document Reviewed: 11/07/2016 Elsevier Patient Education  2020 [employer].  Information on my medicine - ELIQUIS (apixaban)  This medication education was reviewed with me or my healthcare representative as part of my discharge preparation.  Why was Eliquis prescribed for you? Eliquis was prescribed to treat blood clots that may have been found in the veins of your legs (deep vein thrombosis) or in your lungs (pulmonary embolism) and to reduce the risk of them occurring again.  What do You need to know about Eliquis ? The starting dose is 10 mg (two 5 mg tablets) taken TWICE daily for the FIRST SEVEN (7) DAYS, then on 05/23/19  the dose is reduced to ONE 5 mg tablet taken TWICE daily.  Eliquis may be taken with or without food.   Try to take the dose about the same time in the morning and in the evening. If you have difficulty swallowing the tablet whole please discuss with your pharmacist how to take the  medication safely.  Take Eliquis exactly as prescribed and DO NOT stop taking Eliquis without talking to the doctor who prescribed the medication.  Stopping may increase your risk of developing a new blood clot.  Refill your prescription before you run out.  After discharge, you should have regular check-up appointments with your healthcare provider that is prescribing your Eliquis.    What do you do if you miss a dose? If a dose of ELIQUIS is not taken at the scheduled time, take it as soon as possible on the same day and twice-daily administration should be resumed. The dose should not be doubled to make up for a missed dose.  Important Safety Information A possible side effect of Eliquis is bleeding. You should call your healthcare provider right away if you experience any of the following: ? Bleeding from an injury or your nose that does not stop. ? Unusual colored urine (red or dark brown) or unusual colored stools (red or black). ? Unusual bruising for unknown reasons. ? A serious fall or if you hit your head (even if there is no bleeding).  Some medicines may interact with Eliquis and might increase your risk of bleeding or clotting while on Eliquis. To help avoid this, consult your healthcare provider or pharmacist prior to using any new prescription or non-prescription medications, including herbals, vitamins, non-steroidal anti-inflammatory drugs (NSAIDs) and supplements.  This website has more information on Eliquis (apixaban): http://www.eliquis.com/eliquis/home

## 2019-05-16 NOTE — Procedures (Signed)
Patient Name: Wanda Mckenzie  MRN: [id number]  Epilepsy Attending: Lora Havens  Referring Physician/Provider: Dr. Karmen Bongo Date: 05/16/2019 Duration: 23.30 minutes  Patient history: 83 year old female with altered mental status.  EEG to evaluate for seizures.  Level of alertness: Awake  AEDs during EEG study: None  Technical aspects: This EEG study was done with scalp electrodes positioned according to the 10-20 International system of electrode placement. Electrical activity was acquired at a sampling rate of 500Hz  and reviewed with a high frequency filter of 70Hz  and a low frequency filter of 1Hz . EEG data were recorded continuously and digitally stored.   Description: During awake state no clear posterior dominant rhythm was seen.  EEG showed continuous generalized 3 to 6 Hz theta-delta slowing.  Photic stimulation and hyperventilation were not performed.  Abnormality -Continuous slow, generalized  IMPRESSION: This study is suggestive of mild to moderate diffuse encephalopathy, nonspecific to etiology. No seizures or epileptiform discharges were seen throughout the recording.  Aysen Shieh Barbra Sarks

## 2019-05-16 NOTE — Progress Notes (Signed)
Called the patient's poa Mr. Bailey Mech and gave updates. All questions answered to his satisfaction.

## 2019-05-16 NOTE — Progress Notes (Signed)
STROKE TEAM PROGRESS NOTE   INTERVAL HISTORY  EEG currently underway.  No definite witnessed seizure activity but patient was found to be unresponsive and incontinent.  MRI scan of the brain is negative for acute infarct.  MRI of the brain shows no large vessel intracranial or extracranial stenosis.  She does have significant underlying baseline dementia.  She is lying in bed she is interactive but appears to have significant cognitive impairment   OBJECTIVE Vitals:   05/15/19 2004 05/15/19 2310 05/16/19 0436 05/16/19 0840  BP: (!) 191/92 (!) 193/130 (!) 216/82 (!) 185/76  Pulse: 66 67 63 60  Resp: 18 20 15 16   Temp: 97.8 F (36.6 C) 97.8 F (36.6 C)  97.6 F (36.4 C)  TempSrc: Oral Oral  Axillary  SpO2: 95% 96% 96% 100%  Weight:      Height:        CBC:  Recent Labs  Lab 05/15/19 0211 05/16/19 0411  WBC 7.9 8.5  HGB 14.0 14.0  HCT 40.6 40.5  MCV 91.9 91.8  PLT 236 195    Basic Metabolic Panel:  Recent Labs  Lab 05/14/19 1230 05/16/19 0411  NA 142 141  K 3.8 3.1*  CL 104 107  CO2 23 24  GLUCOSE 134* 97  BUN 22 14  CREATININE 1.40* 0.91  CALCIUM 9.3 8.8*    Lipid Panel:     Component Value Date/Time   CHOL 126 05/15/2019 0211   TRIG 64 05/15/2019 0211   HDL 28 (L) 05/15/2019 0211   CHOLHDL 4.5 05/15/2019 0211   VLDL 13 05/15/2019 0211   LDLCALC 85 05/15/2019 0211   HgbA1c:  Lab Results  Component Value Date   HGBA1C 5.5 05/15/2019   Urine Drug Screen: No results found for: LABOPIA, COCAINSCRNUR, LABBENZ, AMPHETMU, THCU, LABBARB  Alcohol Level No results found for: [facility]  IMAGING  Dg Chest 2 View 05/14/2019 IMPRESSION:  1. No active cardiopulmonary disease.  2. Stable appearance of patchy interstitial opacity in the right lower lobe and peripheral aspect of the left lower lobe, likely scarring versus atelectasis.   Ct Head Wo Contrast 05/14/2019 IMPRESSION:  1. No evidence of acute intracranial abnormality.  2. Chronic right frontal and right  occipital encephalomalacia.  3. Generalized cerebral volume loss and moderate chronic small vessel ischemic changes in the cerebral white matter.    Vas [place] Lower Extremity Venous (dvt) (only Mc & Wl 7a-7p) 05/14/2019 Summary:  Right: No evidence of common femoral vein obstruction.  Left: Findings consistent with acute deep vein thrombosis involving the left posterior tibial veins, and left peroneal veins.   Preliminary    Transthoracic Echocardiogram  Diminished ejection fraction 40 to 45%.  Diffuse global hypokinesis.  Nuclear and   Bilateral Carotid Dopplers - pending  ECG - atrial fibrillation - ventricular response 87 BPM (See cardiology reading for complete details)  EEG -diffuse generalized slowing.  No definite epileptiform activity   PHYSICAL EXAM- unchanged from yesterday Blood pressure (!) 185/76, pulse 60, temperature 97.6 F (36.4 C), temperature source Axillary, resp. rate 16, height 5\' 6"  (1.676 m), weight 77.1 kg, SpO2 100 %.  Neurological Examination Mental Status: Alert and oriented to name only.  Patient able to follow some simple commands, but exhibits significantly impaired attention and concentration.   Diminished recall.  Poor insight into illness. Speech fluent but devoid of significant content and is unrelated to the questions asked, consisting primarily of short, stereotyped phrases. She cannot name simple objects. Unable to understand several basic  commands and was unable to comprehend instructions for cerebellar testing. No dysarthria noted. Cranial Nerves: II: Blinks to threat bilaterally. PERRL.  III,IV, VI: ptosis not present, EOMI V,VII: smile symmetric, facial light touch sensation normal bilaterally VIII: hearing normal bilaterally IX,X: uvula rises symmetrically XI: bilateral shoulder shrug XII: midline tongue extension Motor: Able to move all 4 extremities anti gravity with good strength.  Tone and bulk:normal tone throughout; no atrophy  noted Sensory: Light touch intact throughout, bilaterally Deep Tendon Reflexes: 2+ and symmetric biceps and patellae  Plantars: Right: downgoing               Left: downgoing Cerebellar: Unable to cooperate Gait: Deferred     ASSESSMENT/PLAN [name] is a 83 y.o. female with history of a-fib (on Coumadin), COPD, HTN, CVA (2007 right MCA), HLD, and dementia who presented after being found slumped over and incontinent of bowel and bladder.  She did not receive IV t-PA due to minimal deficits.  Stroke suspected:  MRI pending  Code Stroke CT Head - not ordered  CT head - No evidence of acute intracranial abnormality. Chronic right frontal and right occipital encephalomalacia. Generalized cerebral volume loss and moderate chronic small vessel ischemic changes in the cerebral white matter.  MRI head -no acute abnormality   MRA head and neck -no large vessel stenosis or occlusion  CTA H&N - not ordered  CT Perfusion - not ordered  Carotid Doppler -not needed  MRA neck pending - carotid dopplers not indicated. Bilateral Lower Extremity Venous Dopplers - Left lower extremity DVT - heparin IV Sars Corona Virus 2 - negative 2D Echo -diminished ejection fraction 40 to 45%.  EEG -mild generalized slowing  LDL - 85  HgbA1c - not ordered - will order  UDS - not ordered  VTE prophylaxis - Heparin IV Diet    warfarin daily prior to admission, now on Heparin IV  Patient counseled to be compliant with her antithrombotic medications  Ongoing aggressive stroke risk factor management  Therapy recommendations:  pending  Disposition:  Pending  Hypertension  Home BP meds: Cozaar  Current BP meds: none   Blood pressure somewhat high at times but within post stroke/TIA parameters . Permissive hypertension (OK if < 220/120) but gradually normalize in 5-7 days  . Long-term BP goal normotensive  Hyperlipidemia  Home Lipid lowering medication: Lipitor 20 mg  daily  LDL 85, goal < 70  Current lipid lowering medication: Lipitor 20 mg daily   Continue statin at discharge  Other Stroke Risk Factors  Advanced age  Overweight, Body mass index is 27.44 kg/m., recommend weight loss, diet and exercise as appropriate   Hx stroke/TIA  Family hx stroke (father)  Atrial fibrillation (warfarin)  Other Active Problems  Atrial fibrillation - warfarin PTA INR yesterday 1.2 - now on Heparin IV  Creatinine - 1.40  Left lower extremity DVT - Heparin IV  Hospital day # 1 Patient has baseline dementia and presented with altered mental status with incontinence unclear whether she had an unwitnessed seizure or a TIA without focal deficits.  INR was suboptimal and she has history of A. fib and is recommend IV heparin bridge till INR is close to 2.  Discussed with Dr. Margo Aye.  Greater than 50% time during this 25-minute visit was spent on counseling and coordination of care about TIA versus seizure and dementia and answering questions Delia Heady, MD Medical Director [facility] Pager: [phone] 05/16/2019 1:53 PM To contact Stroke Continuity  provider, please refer to [email or web address] After hours, contact General Neurology

## 2019-05-16 NOTE — Progress Notes (Signed)
ANTICOAGULATION CONSULT NOTE - Follow Up Consult  Pharmacy Consult for Heparin >> Apixaban Indication: Acute LLE DVT, hx Afib  Allergies  Allergen Reactions  . Codeine     REACTION: vomiting    Patient Measurements: Height: 5\' 6"  (167.6 cm) Weight: 170 lb (77.1 kg) IBW/kg (Calculated) : 59.3 Heparin Dosing Weight:   Vital Signs: Temp: 97.9 F (36.6 C) (11/23 1214) Temp Source: Axillary (11/23 1214) BP: 193/81 (11/23 1214) Pulse Rate: 60 (11/23 0840)  Labs: Recent Labs    05/14/19 1230  05/15/19 0211 05/15/19 1530 05/15/19 1616 05/16/19 0411  HGB 16.0*  --  14.0  --   --  14.0  HCT 47.9*  --  40.6  --   --  40.5  PLT 252  --  236  --   --  237  LABPROT 14.7  --   --   --   --   --   INR 1.2  --   --   --   --   --   HEPARINUNFRC  --    < > 0.46 0.86* 0.54 0.60  CREATININE 1.40*  --   --   --   --  0.91   < > = values in this interval not displayed.    Estimated Creatinine Clearance: 51.7 mL/min (by C-G formula based on SCr of 0.91 mg/dL).   Assessment: 67 YOF on Warfarin PTA for hx Afib - admitted with AMS concerning for TIA and new LLE DVT. Admit INR low at 1.2 - husband reports that the patient refuses medications at times. The patient was started on a Heparin infusion initially now with plans to transition to oral Apixaban.   Goal of Therapy:  Appropriate anticoagulation for indication and hepatic/renal function  Monitor platelets by anticoagulation protocol: Yes   Plan:  - Continue Heparin at 850 units/hr - d/c at 1759 today - Start Apixaban 10 mg bid x 7 days (first dose at 1800) followed by 5 mg bid - Transition plans discussed with the patient's RN - Will plan to educate prior to discharge, will place AVS in chart - Will sign off of consult but monitor peripherally  Thank you for allowing pharmacy to be a part of this patient's care.  Alycia Rossetti, PharmD, BCPS Clinical Pharmacist Clinical phone for 05/16/2019: [phone] 05/16/2019 2:17 PM    **Pharmacist phone directory can now be found on [email or web address] ([email or web address]).  Listed under [place].

## 2019-05-17 DIAGNOSIS — E785 Hyperlipidemia, unspecified: Secondary | ICD-10-CM

## 2019-05-17 LAB — BASIC METABOLIC PANEL
Anion gap: 9 (ref 5–15)
BUN: 12 mg/dL (ref 8–23)
CO2: 23 mmol/L (ref 22–32)
Calcium: 9.3 mg/dL (ref 8.9–10.3)
Chloride: 108 mmol/L (ref 98–111)
Creatinine, Ser: 0.92 mg/dL (ref 0.44–1.00)
GFR calc Af Amer: >60 mL/min (ref >60)
GFR calc non Af Amer: 59 mL/min — ABNORMAL LOW (ref >60)
Glucose, Bld: 94 mg/dL (ref 70–99)
Potassium: 4 mmol/L (ref 3.5–5.1)
Sodium: 140 mmol/L (ref 135–145)

## 2019-05-17 LAB — CBC
HCT: 43.6 % (ref 36.0–46.0)
Hemoglobin: 15.2 g/dL — ABNORMAL HIGH (ref 12.0–15.0)
MCH: 31.8 pg (ref 26.0–34.0)
MCHC: 34.9 g/dL (ref 30.0–36.0)
MCV: 91.2 fL (ref 80.0–100.0)
Platelets: 260 10*3/uL (ref 150–400)
RBC: 4.78 MIL/uL (ref 3.87–5.11)
RDW: 12.6 % (ref 11.5–15.5)
WBC: 6.9 10*3/uL (ref 4.0–10.5)
nRBC: 0 % (ref 0.0–0.2)

## 2019-05-17 LAB — HEPARIN LEVEL (UNFRACTIONATED): Heparin Unfractionated: >2.2 IU/mL — ABNORMAL HIGH (ref 0.30–0.70)

## 2019-05-17 MED ORDER — LOSARTAN POTASSIUM 50 MG PO TABS: 50.0000 mg | ORAL_TABLET | Freq: Every day | ORAL | 0 refills | Status: DC

## 2019-05-17 MED ORDER — METOPROLOL TARTRATE 25 MG PO TABS: 12.5000 mg | ORAL_TABLET | Freq: Two times a day (BID) | ORAL | 0 refills | Status: DC

## 2019-05-17 MED ORDER — APIXABAN 5 MG PO TABS: ORAL_TABLET | ORAL | 0 refills | Status: DC

## 2019-05-17 MED ORDER — ENSURE ENLIVE PO LIQD: 237.0000 mL | Freq: Three times a day (TID) | ORAL | 0 refills | Status: AC

## 2019-05-17 MED ORDER — FUROSEMIDE 40 MG PO TABS: 40.0000 mg | ORAL_TABLET | Freq: Two times a day (BID) | ORAL | Status: DC

## 2019-05-17 MED ORDER — LOSARTAN POTASSIUM 50 MG PO TABS
50.0000 mg | ORAL_TABLET | Freq: Every day | ORAL | Status: DC
  Administered 2019-05-17 – 2019-05-18 (×2): 50 mg via ORAL
  Filled 2019-05-17 (×2): qty 1

## 2019-05-17 MED ORDER — HYDRALAZINE HCL 100 MG PO TABS: 100.0000 mg | ORAL_TABLET | Freq: Three times a day (TID) | ORAL | 0 refills | Status: DC

## 2019-05-17 MED ORDER — METOPROLOL TARTRATE 12.5 MG HALF TABLET
12.5000 mg | ORAL_TABLET | Freq: Two times a day (BID) | ORAL | Status: DC
  Administered 2019-05-17 – 2019-05-25 (×18): 12.5 mg via ORAL
  Filled 2019-05-17 (×18): qty 1

## 2019-05-17 MED ORDER — LEVOTHYROXINE SODIUM 25 MCG PO TABS: 12.5000 ug | ORAL_TABLET | Freq: Every day | ORAL | 0 refills | Status: DC

## 2019-05-17 MED ORDER — FUROSEMIDE 40 MG PO TABS: 40.0000 mg | ORAL_TABLET | Freq: Every day | ORAL | 0 refills | Status: DC

## 2019-05-17 MED ORDER — POTASSIUM CHLORIDE CRYS ER 20 MEQ PO TBCR: 20.0000 meq | EXTENDED_RELEASE_TABLET | Freq: Every day | ORAL | 0 refills | Status: DC

## 2019-05-17 MED ORDER — FUROSEMIDE 40 MG PO TABS
40.0000 mg | ORAL_TABLET | Freq: Every day | ORAL | Status: DC
  Administered 2019-05-18 – 2019-05-25 (×8): 40 mg via ORAL
  Filled 2019-05-17 (×8): qty 1

## 2019-05-17 NOTE — TOC Benefit Eligibility Note (Signed)
Transition of Care [facility]) Benefit Eligibility Note    Patient Details  Name: Wanda Mckenzie MRN: [id number] Date of Birth: [birth year]   Medication/Dose: Arne Cleveland  5 MG BID  AND   ELIQUIS 2.5 MG  BID  Covered?: Yes  Tier: 3 Drug  Prescription Coverage Preferred Pharmacy: [facility] AND  [facility]    90 DAY SUPPLY FOR M/O $ 174.95  Spoke with Person/Company/Phone Number:: ALEXIS  @ [employer] # [phone]  Co-Pay: $58.49 @  [facility]  Prior Approval: No  Deductible: Unmet  Additional Notes: [place] $ 61.92    Memory Argue Phone Number: 05/17/2019, 12:33 PM

## 2019-05-17 NOTE — Plan of Care (Signed)
Patient consume about 25%of breakfast meal, which is an improvement from yesterday's breakfast consumption.

## 2019-05-17 NOTE — Progress Notes (Signed)
Physical Therapy Treatment Patient Details Name: Wanda Mckenzie MRN: [id number] DOB: January 12, 1936 Today's Date: 05/17/2019    History of Present Illness  Wanda Mckenzie is a 83 y.o. female with medical history significant of CVA; HTN; HLD; COPD; dementia; and afib presenting with AMS. Admitted for TIA CVA work up/seizure rule out evaluation. CT negative for acute abnormality; pending MRI. On heparin drip for acute left lower extremity DVT.     PT Comments    Patient received in bed, alert, not oriented. Reporting neck pain with head turned to her right, will not let me turn or reposition. Patient requires max assist for all bed mobility, mainly limited by cognition. Patient performed LE ROM/stretching exercises in supine with assist. Patient will continue to benefit from skilled PT to improve functional independence.      Follow Up Recommendations  SNF;Supervision/Assistance - 24 hour     Equipment Recommendations  None recommended by PT    Recommendations for Other Services       Precautions / Restrictions Precautions Precautions: Fall Restrictions Weight Bearing Restrictions: No    Mobility  Bed Mobility Overal bed mobility: Needs Assistance Bed Mobility: Supine to Sit;Sit to Supine     Supine to sit: Max assist Sit to supine: Max assist   General bed mobility comments: MaxA for initiation of leg/truncal movement  Transfers                 General transfer comment: deferred  Ambulation/Gait             General Gait Details: unable   Stairs             Wheelchair Mobility    Modified Rankin (Stroke Patients Only) Modified Rankin (Stroke Patients Only) Pre-Morbid Rankin Score: Moderately severe disability Modified Rankin: Severe disability     Balance Overall balance assessment: Needs assistance Sitting-balance support: Feet supported Sitting balance-Leahy Scale: Fair                                      Cognition  Arousal/Alertness: Awake/alert Behavior During Therapy: WFL for tasks assessed/performed Overall Cognitive Status: History of cognitive impairments - at baseline                                 General Comments: History of dementia; pt pleasantly confused, follows simple commands with multimodal cues and increased time, oriented to self only, not age or birthday.      Exercises Other Exercises Other Exercises: B LE exercises/rom: AP, heel slides, hip abd/add x 5-10 reps. Occasional resistance.    General Comments        Pertinent Vitals/Pain Pain Assessment: Faces Faces Pain Scale: Hurts even more Pain Location: neck which is rotated to the right, but she is unable to turn towards left and will not allow me to turn her head due to pain Pain Descriptors / Indicators: Guarding;Grimacing;Moaning;Discomfort;Sore Pain Intervention(s): Monitored during session    Home Living                      Prior Function            PT Goals (current goals can now be found in the care plan section) Acute Rehab PT Goals Patient Stated Goal: unable PT Goal Formulation: Patient unable to participate in goal setting Time For  Goal Achievement: 05/29/19 Potential to Achieve Goals: Fair Progress towards PT goals: Progressing toward goals    Frequency    Min 2X/week      PT Plan Current plan remains appropriate    Co-evaluation              AM-PAC PT "6 Clicks" Mobility   Outcome Measure  Help needed turning from your back to your side while in a flat bed without using bedrails?: Total Help needed moving from lying on your back to sitting on the side of a flat bed without using bedrails?: Total Help needed moving to and from a bed to a chair (including a wheelchair)?: Total Help needed standing up from a chair using your arms (e.g., wheelchair or bedside chair)?: Total Help needed to walk in hospital room?: Total Help needed climbing 3-5 steps with a  railing? : Total 6 Click Score: 6    End of Session   Activity Tolerance: Other (comment)(limited by confusion) Patient left: in bed;with bed alarm set;with call bell/phone within reach Nurse Communication: Mobility status PT Visit Diagnosis: Muscle weakness (generalized) (M62.81);Other abnormalities of gait and mobility (R26.89);Pain Pain - part of body: (neck)     Time: 6226-3335 PT Time Calculation (min) (ACUTE ONLY): 15 min  Charges:  $Therapeutic Activity: 8-22 mins                     Marvetta Vohs, PT, GCS 05/17/19,2:47 PM

## 2019-05-17 NOTE — Progress Notes (Signed)
STROKE TEAM PROGRESS NOTE   INTERVAL HISTORY  EEG yesterday showed mild to diffuse encephalopathy and generalized slowing no definite epileptiform activity.  She is lying in bed comfortably.  She can be aroused with appears to be significantly disoriented and can follow only simple commands.  No new changes noted.  OBJECTIVE Vitals:   05/17/19 0350 05/17/19 0402 05/17/19 0431 05/17/19 0802  BP: (!) 211/77  (!) 170/62 (!) 150/64  Pulse: (!) 58  (!) 54 (!) 59  Resp: 16 20 19  (!) 21  Temp: 98.5 F (36.9 C)  98.6 F (37 C) 97.6 F (36.4 C)  TempSrc: Axillary  Axillary Oral  SpO2: 95%  94% 96%  Weight:      Height:        CBC:  Recent Labs  Lab 05/16/19 0411 05/17/19 0305  WBC 8.5 6.9  HGB 14.0 15.2*  HCT 40.5 43.6  MCV 91.8 91.2  PLT 237 260    Basic Metabolic Panel:  Recent Labs  Lab 05/16/19 0411 05/17/19 0305  NA 141 140  K 3.1* 4.0  CL 107 108  CO2 24 23  GLUCOSE 97 94  BUN 14 12  CREATININE 0.91 0.92  CALCIUM 8.8* 9.3    Lipid Panel:     Component Value Date/Time   CHOL 126 05/15/2019 0211   TRIG 64 05/15/2019 0211   HDL 28 (L) 05/15/2019 0211   CHOLHDL 4.5 05/15/2019 0211   VLDL 13 05/15/2019 0211   LDLCALC 85 05/15/2019 0211   HgbA1c:  Lab Results  Component Value Date   HGBA1C 5.5 05/15/2019   Urine Drug Screen: No results found for: LABOPIA, COCAINSCRNUR, LABBENZ, AMPHETMU, THCU, LABBARB  Alcohol Level No results found for: [facility]  IMAGING  Dg Chest 2 View 05/14/2019 IMPRESSION:  1. No active cardiopulmonary disease.  2. Stable appearance of patchy interstitial opacity in the right lower lobe and peripheral aspect of the left lower lobe, likely scarring versus atelectasis.   Ct Head Wo Contrast 05/14/2019 IMPRESSION:  1. No evidence of acute intracranial abnormality.  2. Chronic right frontal and right occipital encephalomalacia.  3. Generalized cerebral volume loss and moderate chronic small vessel ischemic changes in the cerebral  white matter.    Vas 05/16/2019 Lower Extremity Venous (dvt) (only Mc & Wl 7a-7p) 05/14/2019 Summary:  Right: No evidence of common femoral vein obstruction.  Left: Findings consistent with acute deep vein thrombosis involving the left posterior tibial veins, and left peroneal veins.   Preliminary    Transthoracic Echocardiogram  Diminished ejection fraction 40 to 45%.  Diffuse global hypokinesis.  Nuclear and   Bilateral Carotid Dopplers -not needed see MRA neck ECG - atrial fibrillation - ventricular response 87 BPM (See cardiology reading for complete details)  EEG -diffuse generalized slowing.  No definite epileptiform activity   PHYSICAL EXAM- unchanged from yesterday Blood pressure (!) 150/64, pulse (!) 59, temperature 97.6 F (36.4 C), temperature source Oral, resp. rate (!) 21, height 5\' 6"  (1.676 m), weight 77.1 kg, SpO2 96 %.  Neurological Examination Mental Status: Alert and oriented to name only.  Patient able to follow some simple commands, but exhibits significantly impaired attention and concentration.   Diminished recall.  Poor insight into illness. Speech fluent but devoid of significant content and is unrelated to the questions asked, consisting primarily of short, stereotyped phrases. She cannot name simple objects. Unable to understand several basic commands and was unable to comprehend instructions for cerebellar testing. No dysarthria noted. Cranial Nerves: II: Blinks  to threat bilaterally. PERRL.  III,IV, VI: ptosis not present, EOMI V,VII: smile symmetric, facial light touch sensation normal bilaterally VIII: hearing normal bilaterally IX,X: uvula rises symmetrically XI: bilateral shoulder shrug XII: midline tongue extension Motor: Able to move all 4 extremities anti gravity with good strength.  Tone and bulk:normal tone throughout; no atrophy noted Sensory: Light touch intact throughout, bilaterally Deep Tendon Reflexes: 2+ and symmetric biceps and patellae   Plantars: Right: downgoing               Left: downgoing Cerebellar: Unable to cooperate Gait: Deferred     ASSESSMENT/PLAN [name] is a 83 y.o. female with history of a-fib (on Coumadin), COPD, HTN, CVA (2007 right MCA), HLD, and dementia who presented after being found slumped over and incontinent of bowel and bladder.  She did not receive IV t-PA due to minimal deficits.  Stroke suspected:  MRI pending  Code Stroke CT Head - not ordered  CT head - No evidence of acute intracranial abnormality. Chronic right frontal and right occipital encephalomalacia. Generalized cerebral volume loss and moderate chronic small vessel ischemic changes in the cerebral white matter.  MRI head -no acute abnormality   MRA head and neck -no large vessel stenosis or occlusion  CTA H&N - not ordered  CT Perfusion - not ordered  Carotid Doppler -not needed  MRA neck pending - carotid dopplers not indicated. Bilateral Lower Extremity Venous Dopplers - Left lower extremity DVT - heparin IV Sars Corona Virus 2 - negative 2D Echo -diminished ejection fraction 40 to 45%.  EEG -mild generalized slowing  LDL - 85  HgbA1c -5.5  UDS - not ordered  VTE prophylaxis - Heparin IV Diet    warfarin daily prior to admission, now on Heparin IV  Patient counseled to be compliant with her antithrombotic medications  Ongoing aggressive stroke risk factor management  Therapy recommendations:  SNF  Disposition:  Pending  Hypertension  Home BP meds: Cozaar  Current BP meds: none   Blood pressure somewhat high at times but within post stroke/TIA parameters . Permissive hypertension (OK if < 220/120) but gradually normalize in 5-7 days  . Long-term BP goal normotensive  Hyperlipidemia  Home Lipid lowering medication: Lipitor 20 mg daily  LDL 85, goal < 70  Current lipid lowering medication: Lipitor 20 mg daily   Continue statin at discharge  Other Stroke Risk  Factors  Advanced age  Overweight, Body mass index is 27.44 kg/m., recommend weight loss, diet and exercise as appropriate   Hx stroke/TIA  Family hx stroke (father)  Atrial fibrillation (warfarin)  Other Active Problems  Atrial fibrillation - warfarin PTA INR yesterday 1.2 - now on Heparin IV  Creatinine - 1.40  Left lower extremity DVT - Heparin IV  Hospital day # 2 Patient has baseline dementia and presented with altered mental status with incontinence unclear whether she had an unwitnessed seizure or a TIA without focal deficits.  INR was suboptimal and she has history of A. fib and   recommend IV heparin bridge till INR is close to 2.  Transfer to skilled nursing facility when INR is optimal.  Stroke team will sign off.  Kindly call for questions.   Antony Contras, MD Medical Director [facility] Pager: [phone] 05/17/2019 11:12 AM To contact Stroke Continuity provider, please refer to [email or web address] After hours, contact General Neurology

## 2019-05-17 NOTE — Discharge Summary (Signed)
Discharge Summary  Wanda Mckenzie FMB:[id number] DOB: 1936-01-22  PCP: [name], FNP  Admit date: 05/14/2019 Discharge date: 05/17/2019  Time spent: 35 minutes  Recommendations for Outpatient Follow-up:  1. Follow-up with your PCP 2. Take your medications as prescribed 3. Continue physical therapy 4. Fall precautions  Discharge Diagnoses:  Active Hospital Problems   Diagnosis Date Noted   AMS (altered mental status) 05/14/2019   Left leg DVT (HCC) 05/14/2019   Stage 3b chronic kidney disease 05/14/2019   Chronic congestive heart failure (HCC) 01/10/2019   Hyperlipidemia 06/19/2016   Vascular dementia without behavioral disturbance (HCC) 06/08/2014   Essential hypertension    Chronic atrial fibrillation (HCC)    COPD (chronic obstructive pulmonary disease) (HCC)     Resolved Hospital Problems  No resolved problems to display.    Discharge Condition: Stable  Diet recommendation: Resume previous diet  Vitals:   05/17/19 0431 05/17/19 0802  BP: (!) 170/62 (!) 150/64  Pulse: (!) 54 (!) 59  Resp: 19 (!) 21  Temp: 98.6 F (37 C) 97.6 F (36.4 C)  SpO2: 94% 96%    History of present illness:  Wanda J Nelsonis a 83 y.o.femalewith medical history significant ofCVA; HTN; HLD; COPD; dementia; and afib presenting with AMS.Patient has advanced dementia and is unable to answer questions. I called and discussed the patient's husband. He reports that a caregiver was there when episode happened. Sat and ate a few bites, looked like she was walking to fall out of the chair and so they laid her in the floor and called 911. She did not lose consciousness. She has been refusing her medications some, would not eat or take meds the last few days.    ED Course:Dementia, presenting with AMS, ?history. Slumped over, lost control of bowel/bladder. Back to baseline upon EMS arrival. ?LLE weakness. LLE pain, +DVT despite Coumadin for afib (INR  subtherapeutic). ?CVA/TIA, seizure. Neurology will consult.  11/22: Patient was seen and examined at her bedside this morning.  She is alert but confused in a state of dementia.  She does not follow commands however she is able to move her upper extremities bilaterally.  On heparin drip for acute left lower extremity DVT.  Home Coumadin on hold.  Will consider switching to direct oral anticoagulation like Eliquis.  05/16/19: Patient was seen and examined at her bedside this morning.  She is more alert today and interactive.  No new complaints.   Hospital Course:  Principal Problem:   AMS (altered mental status) Active Problems:   COPD (chronic obstructive pulmonary disease) (HCC)   Chronic atrial fibrillation (HCC)   Essential hypertension   Hyperlipidemia   Vascular dementia without behavioral disturbance (HCC)   Chronic congestive heart failure (HCC)   Left leg DVT (HCC)   Stage 3b chronic kidney disease  Resolved acute metabolic encephalopathy in the context of dementia/CVA work-up, ruled out/seizure work-up, EEG negative with no seizure-like movement while inpatient. Presented from home where she was slumped over and was incontinent of bowel and bladder Differential diagnosis includes seizure versus TIA/stroke versus syncope versus transient alteration of consciousness in the context of advanced dementia. MRI brain without contrast MRI neck without contrast unremarkable for any acute findings.  No sign of acute CVA.  No large vessel stenosis or occlusion. Presented with subtherapeutic INR, 1.2 in the setting of chronic A. fib on Coumadin with medication noncompliance. 2D echo done on 05/14/2019 >>LVEF 40-45% with diffused global hypokinesis EEG no evidence of seizure activity.  Mild generalized slowing. Seen by neurology.  Newly diagnosed acute left lower extremity DVT Coumadin held, was on hep drip>> until 05/16/19 Switched to eliquis on 05/16/19, continue   Uncontrolled  hypertension, complicated by A. fib with slow ventricular response Decreased dose of Lopressor 12.5 mg twice daily Continue hydralazine, increase dose to 100 mg 3 times daily Restarted losartan 50 mg daily. Restarted p.o. Lasix 40 mg daily. Continue Kcl- po 20 meq daily while on diuretics. Follow-up with primary care provider  Resolved AKI on CKD 2, likely prerenal in the setting of dehydration. Baseline creatinine appears to be 1.1 with GFR of 47 Presented with creatinine of 1.4 with GFR of 35 Creatinine 0.9 with GFR of 60 on 05/16/2019 Continue to avoid nephrotoxins   Chronic A. fib on Coumadin Presented with INR 1.2 Goal INR between 2 and 3: Was on Coumadin Coumadin was held Switched to Eliquis on 05/16/2019  Chronic systolic CHF Euvolemic on exam 2D echo done during this admission showed LVEF 40 to 45% Continue strict I's and O's and daily weight Continue current cardiac medications Follow-up with your cardiologist outpatient  Isolated elevated bilirubin Alkaline phosphatase,  AST ALT normal T bili 1.6 from 1.7 Follow up with your PCP  Dementia Reorient as needed Continue fall precautions  Hyperlipidemia LDL 85 on 05/16/2019 Continue Lipitor 20 mg daily  Hypothyroidism Continue levothyroxine 12.5 mg daily  Physical debility PT OT assessment recommended SNF CSW assisting with SNF placement Continue PT OT with assistance and fall precautions    Code Status:DNR- confirmed with family   Consults called:Neurology; PT/OT/ST/SW/Nutrition   Discharge Exam: BP (!) 150/64 (BP Location: Right Arm)    Pulse (!) 59    Temp 97.6 F (36.4 C) (Oral)    Resp (!) 21    Ht  (1.676 m)    Wt 77.1 kg    SpO2 96%    BMI 27.44 kg/m   General: 83 y.o. year-old female well developed well nourished in no acute distress.  Alert and interactive in a state of dementia.  Cardiovascular: Irregular rate and rhythm with no rubs or gallops.  No thyromegaly or JVD  noted.    Respiratory: Clear to auscultation with no wheezes or rales. Good inspiratory effort.  Abdomen: Soft nontender nondistended with normal bowel sounds x4 quadrants.  Musculoskeletal: Trace lower extremity edema.   Psychiatry: Mood is appropriate for condition and setting  Discharge Instructions You were cared for by a hospitalist during your hospital stay. If you have any questions about your discharge medications or the care you received while you were in the hospital after you are discharged, you can call the unit and asked to speak with the hospitalist on call if the hospitalist that took care of you is not available. Once you are discharged, your primary care physician will handle any further medical issues. Please note that NO REFILLS for any discharge medications will be authorized once you are discharged, as it is imperative that you return to your primary care physician (or establish a relationship with a primary care physician if you do not have one) for your aftercare needs so that they can reassess your need for medications and monitor your lab values.   Allergies as of 05/17/2019      Reactions   Codeine    REACTION: vomiting      Medication List    STOP taking these medications   warfarin 5 MG tablet Commonly known as: COUMADIN     TAKE these  medications   acetaminophen 500 MG tablet Commonly known as: TYLENOL Take 1,000 mg by mouth every 6 (six) hours as needed for headache (pain).   AeroChamber Plus inhaler Use as instructed   albuterol 108 (90 Base) MCG/ACT inhaler Commonly known as: VENTOLIN HFA Inhale 2 puffs into the lungs every 6 (six) hours as needed for wheezing or shortness of breath.   apixaban 5 MG Tabs tablet Commonly known as: Eliquis Take 2 tablets (10mg ) twice daily for 7 days, then 1 tablet (5mg ) twice daily   atorvastatin 20 MG tablet Commonly known as: LIPITOR Take 1 tablet (20 mg total) by mouth daily. What changed: when to take  this   feeding supplement (ENSURE ENLIVE) Liqd Take 237 mLs by mouth 3 (three) times daily between meals for 7 days.   furosemide 40 MG tablet Commonly known as: LASIX Take 1 tablet (40 mg total) by mouth daily. Start taking on: May 18, 2019 What changed: when to take this   hydrALAZINE 100 MG tablet Commonly known as: APRESOLINE Take 1 tablet (100 mg total) by mouth every 8 (eight) hours.   levothyroxine 25 MCG tablet Commonly known as: SYNTHROID Take 0.5 tablets (12.5 mcg total) by mouth daily before breakfast. Start taking on: May 18, 2019   losartan 50 MG tablet Commonly known as: COZAAR Take 1 tablet (50 mg total) by mouth daily. Start taking on: May 18, 2019 What changed:   medication strength  how much to take   metoprolol tartrate 25 MG tablet Commonly known as: LOPRESSOR Take 0.5 tablets (12.5 mg total) by mouth 2 (two) times daily.   Namzaric 28-10 MG Cp24 Generic drug: Memantine HCl-Donepezil HCl Take 1 capsule by mouth at bedtime. What changed: when to take this   potassium chloride SA 20 MEQ tablet Commonly known as: KLOR-CON Take 1 tablet (20 mEq total) by mouth daily. What changed: when to take this      Allergies  Allergen Reactions   Codeine     REACTION: vomiting   Follow-up Information    [name], FNP. Call in 1 day(s).   Specialty: Family Medicine Why: Please call for a post hospital follow-up appointment. Contact information: [address] [place] [address] [phone]        Jonelle Sidle, MD Follow up.   Specialty: Cardiology Why: Please call for a post hospital follow-up appointment. Contact information: [address] [place] [address] [phone]            The results of significant diagnostics from this hospitalization (including imaging, microbiology, ancillary and laboratory) are listed below for reference.    Significant Diagnostic Studies: Dg Chest 2  View  Result Date: 05/14/2019 CLINICAL DATA:  Syncope EXAM: CHEST - 2 VIEW COMPARISON:  04/07/2018 FINDINGS: Stable cardiomediastinal contours. Calcific aortic knob. Unchanged appearance of patchy interstitial opacity the medial aspect of the right lower lobe and peripheral aspect of the left lower lobe, likely chronic scarring or atelectasis. No new focal airspace consolidation no pleural effusion. No pneumothorax. No acute osseous findings. IMPRESSION: 1. No active cardiopulmonary disease. 2. Stable appearance of patchy interstitial opacity in the right lower lobe and peripheral aspect of the left lower lobe, likely scarring versus atelectasis. Electronically Signed   By: Duanne Guess M.D.   On: 05/14/2019 13:37   Ct Head Wo Contrast  Result Date: 05/14/2019 CLINICAL DATA:  Altered level of consciousness. Bowel incontinence. EXAM: CT HEAD WITHOUT CONTRAST TECHNIQUE: Contiguous axial images were obtained from  the base of the skull through the vertex without intravenous contrast. COMPARISON:  11/21/2013 head CT. FINDINGS: Brain: Chronic right frontal and right occipital encephalomalacia. No evidence of parenchymal hemorrhage or extra-axial fluid collection. No mass lesion, mass effect, or midline shift. No CT evidence of acute infarction. Nonspecific moderate subcortical and periventricular white matter hypodensity, most in keeping with chronic small vessel ischemic change. Generalized cerebral volume loss. Cerebral ventricle sizes are concordant with the degree of cerebral volume loss. Vascular: No acute abnormality. Skull: No evidence of calvarial fracture. Sinuses/Orbits: The visualized paranasal sinuses are essentially clear. Other:  The mastoid air cells are unopacified. IMPRESSION: 1. No evidence of acute intracranial abnormality. 2. Chronic right frontal and right occipital encephalomalacia. 3. Generalized cerebral volume loss and moderate chronic small vessel ischemic changes in the cerebral  white matter. Electronically Signed   By: Delbert PhenixJason A Poff M.D.   On: 05/14/2019 13:30   Mr Angio Neck Wo Contrast  Result Date: 05/15/2019 CLINICAL DATA:  83 year old female with dementia and atrial fibrillation presenting with altered mental status. EXAM: MRI HEAD WITHOUT CONTRAST MRA NECK WITHOUT CONTRAST TECHNIQUE: Multiplanar, multiecho pulse sequences of the brain and surrounding structures were obtained without intravenous contrast. Angiographic images of the neck were obtained using MRA technique with and without intravenous contrast. Carotid stenosis measurements (when applicable) are obtained utilizing NASCET criteria, using the distal internal carotid diameter as the denominator. CONTRAST:  None. COMPARISON:  CT head 05/14/2019. Remote MR head 12/03/2005 FINDINGS: MRI HEAD FINDINGS Patient was uncooperative. Some series are degraded by motion or could not can be completed. Brain: No evidence for acute infarction, hemorrhage, mass lesion, hydrocephalus, or extra-axial fluid. Generalized atrophy. Remote RIGHT frontal infarct/hematoma, with chronic blood products layering the encephalomalacia cavity. Also remote RIGHT occipital infarct is noted. Both these abnormalities have been present since 2007. T2 and FLAIR hyperintensities in the white matter favored to represent chronic microvascular ischemic change. Vascular: Flow voids are maintained. Skull and upper cervical spine: Normal marrow signal. Mild pannus. Sinuses/Orbits: No acute findings. Other: None. MRA NECK FINDINGS Noncontrast examination was performed. Conventional branching of the great vessels from the arch without visible proximal stenosis. Carotid bifurcations demonstrate no flow reducing lesion. Posterior wall plaque at the LEFT carotid bifurcation is suggested, without findings to suggest greater than 50% stenosis. BILATERAL vertebral arteries are patent, RIGHT dominant. IMPRESSION: 1. No acute intracranial findings. Atrophy and small vessel  disease. Remote ischemic changes as described. 2. No extracranial or intracranial flow reducing lesion is evident. Electronically Signed   By: Elsie StainJohn T Curnes M.D.   On: 05/15/2019 13:58   Mr Brain Wo Contrast  Result Date: 05/15/2019 CLINICAL DATA:  83 year old female with dementia and atrial fibrillation presenting with altered mental status. EXAM: MRI HEAD WITHOUT CONTRAST MRA NECK WITHOUT CONTRAST TECHNIQUE: Multiplanar, multiecho pulse sequences of the brain and surrounding structures were obtained without intravenous contrast. Angiographic images of the neck were obtained using MRA technique with and without intravenous contrast. Carotid stenosis measurements (when applicable) are obtained utilizing NASCET criteria, using the distal internal carotid diameter as the denominator. CONTRAST:  None. COMPARISON:  CT head 05/14/2019. Remote MR head 12/03/2005 FINDINGS: MRI HEAD FINDINGS Patient was uncooperative. Some series are degraded by motion or could not can be completed. Brain: No evidence for acute infarction, hemorrhage, mass lesion, hydrocephalus, or extra-axial fluid. Generalized atrophy. Remote RIGHT frontal infarct/hematoma, with chronic blood products layering the encephalomalacia cavity. Also remote RIGHT occipital infarct is noted. Both these abnormalities have been present since 2007. T2  and FLAIR hyperintensities in the white matter favored to represent chronic microvascular ischemic change. Vascular: Flow voids are maintained. Skull and upper cervical spine: Normal marrow signal. Mild pannus. Sinuses/Orbits: No acute findings. Other: None. MRA NECK FINDINGS Noncontrast examination was performed. Conventional branching of the great vessels from the arch without visible proximal stenosis. Carotid bifurcations demonstrate no flow reducing lesion. Posterior wall plaque at the LEFT carotid bifurcation is suggested, without findings to suggest greater than 50% stenosis. BILATERAL vertebral arteries  are patent, RIGHT dominant. IMPRESSION: 1. No acute intracranial findings. Atrophy and small vessel disease. Remote ischemic changes as described. 2. No extracranial or intracranial flow reducing lesion is evident. Electronically Signed   By: Staci Righter M.D.   On: 05/15/2019 13:58   Vas [place] Lower Extremity Venous (dvt) (only Mc & Wl 7a-7p)  Result Date: 05/15/2019  Lower Venous Study Indications: Pain, Swelling, and syncope.  Comparison Study: No prior Performing Technologist: Sharion Dove RVS  Examination Guidelines: A complete evaluation includes B-mode imaging, spectral Doppler, color Doppler, and power Doppler as needed of all accessible portions of each vessel. Bilateral testing is considered an integral part of a complete examination. Limited examinations for reoccurring indications may be performed as noted.  +-----+---------------+---------+-----------+----------+--------------+  RIGHT Compressibility Phasicity Spontaneity Properties Thrombus Aging  +-----+---------------+---------+-----------+----------+--------------+  CFV   Full            Yes       Yes                                    +-----+---------------+---------+-----------+----------+--------------+   +---------+---------------+---------+-----------+----------+--------------+  LEFT      Compressibility Phasicity Spontaneity Properties Thrombus Aging  +---------+---------------+---------+-----------+----------+--------------+  CFV       Full            Yes       Yes                                    +---------+---------------+---------+-----------+----------+--------------+  SFJ       Full                                                             +---------+---------------+---------+-----------+----------+--------------+  FV Prox   Full                                                             +---------+---------------+---------+-----------+----------+--------------+  FV Mid    Full                                                              +---------+---------------+---------+-----------+----------+--------------+  FV Distal Full                                                             +---------+---------------+---------+-----------+----------+--------------+  PFV       Full                                                             +---------+---------------+---------+-----------+----------+--------------+  POP       Full            Yes       Yes                                    +---------+---------------+---------+-----------+----------+--------------+  PTV       None                                             Acute           +---------+---------------+---------+-----------+----------+--------------+  PERO      None                                             Acute           +---------+---------------+---------+-----------+----------+--------------+     Summary: Right: No evidence of common femoral vein obstruction. Left: Findings consistent with acute deep vein thrombosis involving the left posterior tibial veins, and left peroneal veins.  *See table(s) above for measurements and observations. Electronically signed by Lemar LivingsBrandon Cain MD on 05/15/2019 at 10:58:36 AM.    Final     Microbiology: Recent Results (from the past 240 hour(s))  Urine culture     Status: Abnormal   Collection Time: 05/14/19 12:58 PM   Specimen: Urine, Clean Catch  Result Value Ref Range Status   Specimen Description URINE, CLEAN CATCH  Final   Special Requests   Final    NONE Performed at [facility], [address] [place], [place] [address]    Culture MULTIPLE SPECIES PRESENT, SUGGEST RECOLLECTION (A)  Final   Report Status 05/15/2019 FINAL  Final  SARS CORONAVIRUS 2 (TAT 6-24 HRS) Nasopharyngeal Nasopharyngeal Swab     Status: None   Collection Time: 05/14/19  3:35 PM   Specimen: Nasopharyngeal Swab  Result Value Ref Range Status   SARS Coronavirus 2 NEGATIVE NEGATIVE Final    Comment: (NOTE) SARS-CoV-2 target nucleic acids are  NOT DETECTED. The SARS-CoV-2 RNA is generally detectable in upper and lower respiratory specimens during the acute phase of infection. Negative results do not preclude SARS-CoV-2 infection, do not rule out co-infections with other pathogens, and should not be used as the sole basis for treatment or other patient management decisions. Negative results must be combined with clinical observations, patient history, and epidemiological information. The expected result is Negative. Fact Sheet for Patients: HairSlick.nohttps://www.fda.gov/media/138098/download Fact Sheet for Healthcare Providers: quierodirigir.comhttps://www.fda.gov/media/138095/download This test is not yet approved or cleared by the [place] FDA and  has been authorized for detection and/or diagnosis of SARS-CoV-2 by FDA under an Emergency Use Authorization (EUA). This EUA will remain  in effect (meaning this test can be used) for the duration of the COVID-19 declaration under Section 56 4(b)(1) of  the Act, 21 U.S.C. section 360bbb-3(b)(1), unless the authorization is terminated or revoked sooner. Performed at [facility], [address] [place], [place] [address]      Labs: Basic Metabolic Panel: Recent Labs  Lab 05/14/19 1230 05/16/19 0411 05/17/19 0305  NA 142 141 140  K 3.8 3.1* 4.0  CL 104 107 108  CO2 GLUCOSE 134* 97 94  BUN CREATININE 1.40* 0.91 0.92  CALCIUM 9.3 8.8* 9.3   Liver Function Tests: Recent Labs  Lab 05/14/19 1230 05/16/19 0411  AST 33 25  ALT 25 20  ALKPHOS 93 74  BILITOT 1.7* 1.6*  PROT 6.9 6.0*  ALBUMIN 4.0 3.2*   No results for input(s): LIPASE, AMYLASE in the last 168 hours. No results for input(s): AMMONIA in the last 168 hours. CBC: Recent Labs  Lab 05/14/19 1230 05/15/19 0211 05/16/19 0411 05/17/19 0305  WBC 11.6* 7.9 8.5 6.9  HGB 16.0* 14.0 14.0 15.2*  HCT 47.9* 40.6 40.5 43.6  MCV 95.4 91.9 91.8 91.2  PLT 252 236 237 260   Cardiac Enzymes: No results  for input(s): CKTOTAL, CKMB, CKMBINDEX, TROPONINI in the last 168 hours. BNP: BNP (last 3 results) No results for input(s): BNP in the last 8760 hours.  ProBNP (last 3 results) No results for input(s): PROBNP in the last 8760 hours.  CBG: Recent Labs  Lab 05/14/19 1251  GLUCAP 130*       Signed:  Darlin Drop, MD [facility] 05/17/2019, 11:34 AM

## 2019-05-17 NOTE — TOC Progression Note (Signed)
Transition of Care [facility]) - Progression Note    Patient Details  Name: Wanda Mckenzie MRN: [id number] Date of Birth: [birth year]  Transition [facility] CM/SW Contact  Pollie Friar, RN Phone Number: 05/17/2019, 1:10 PM  Clinical Narrative:    Son has selected [place]. Gerald Stabs with [facility] aware and will start insurance auth through her [employer]. Pt will need repeat covid and MD updated.  POA paper placed on the chart.  TOC following.   Expected Discharge Plan: [place] Barriers to Discharge: Ship broker, Continued Medical Work up  Expected Discharge Plan and Services Expected Discharge Plan: [place] In-house Referral: Clinical Social Work Discharge Planning Services: NA Post Acute Care Choice: [place] Living arrangements for the past 2 months: Single Family Home Expected Discharge Date: 05/17/19               DME Arranged: N/A DME Agency: NA                   Social Determinants of Health (SDOH) Interventions    Readmission Risk Interventions No flowsheet data found.

## 2019-05-18 LAB — CBC
HCT: 45.5 % (ref 36.0–46.0)
Hemoglobin: 15.4 g/dL — ABNORMAL HIGH (ref 12.0–15.0)
MCH: 31.3 pg (ref 26.0–34.0)
MCHC: 33.8 g/dL (ref 30.0–36.0)
MCV: 92.5 fL (ref 80.0–100.0)
Platelets: 262 10*3/uL (ref 150–400)
RBC: 4.92 MIL/uL (ref 3.87–5.11)
RDW: 13.1 % (ref 11.5–15.5)
WBC: 9.7 10*3/uL (ref 4.0–10.5)
nRBC: 0 % (ref 0.0–0.2)

## 2019-05-18 LAB — BASIC METABOLIC PANEL
Anion gap: 12 (ref 5–15)
BUN: 19 mg/dL (ref 8–23)
CO2: 22 mmol/L (ref 22–32)
Calcium: 9.2 mg/dL (ref 8.9–10.3)
Chloride: 108 mmol/L (ref 98–111)
Creatinine, Ser: 1.11 mg/dL — ABNORMAL HIGH (ref 0.44–1.00)
GFR calc Af Amer: 54 mL/min — ABNORMAL LOW (ref >60)
GFR calc non Af Amer: 47 mL/min — ABNORMAL LOW (ref >60)
Glucose, Bld: 106 mg/dL — ABNORMAL HIGH (ref 70–99)
Potassium: 3.9 mmol/L (ref 3.5–5.1)
Sodium: 142 mmol/L (ref 135–145)

## 2019-05-18 LAB — SARS CORONAVIRUS 2 (TAT 6-24 HRS): SARS Coronavirus 2: NEGATIVE

## 2019-05-18 MED ORDER — LOSARTAN POTASSIUM 50 MG PO TABS
100.0000 mg | ORAL_TABLET | Freq: Every day | ORAL | Status: DC
  Administered 2019-05-19 – 2019-05-25 (×7): 100 mg via ORAL
  Filled 2019-05-18 (×7): qty 2

## 2019-05-18 MED ORDER — LOSARTAN POTASSIUM 50 MG PO TABS
50.0000 mg | ORAL_TABLET | Freq: Once | ORAL | Status: AC
  Administered 2019-05-18: 50 mg via ORAL
  Filled 2019-05-18: qty 1

## 2019-05-18 NOTE — TOC Progression Note (Signed)
Transition of Care [facility]) - Progression Note    Patient Details  Name: Wanda Mckenzie MRN: [id number] Date of Birth: 05/12/1936  Transition of Care [facility]) CM/SW [place], [place] Phone Number: 05/18/2019, 8:32 AM  Clinical Narrative:    CSW notes COVID has not yet been collected, secure messaged RN for this to be swabbed today. Insurance auth still pending through [employer] per [employer] with [facility].    Expected Discharge Plan: [place] Barriers to Discharge: Ship broker, Continued Medical Work up  Expected Discharge Plan and Services Expected Discharge Plan: [place] In-house Referral: Clinical Social Work Discharge Planning Services: NA Post Acute Care Choice: [place] Living arrangements for the past 2 months: Single Family Home Expected Discharge Date: 05/17/19               DME Arranged: N/A DME Agency: NA   Social Determinants of Health (SDOH) Interventions    Readmission Risk Interventions No flowsheet data found.

## 2019-05-18 NOTE — Discharge Summary (Signed)
Discharge Summary  GIFT REIMERS YFV:[id number] DOB: 11-May-1936  PCP: [name], FNP  Admit date: 05/14/2019 Discharge date: 05/18/2019  Time spent: 35 minutes  Recommendations for Outpatient Follow-up:  1. Follow-up with your PCP 2. Follow-up with your cardiologist 3. Take your medications as prescribed 4. Continue physical therapy 5. Fall precautions  Discharge Diagnoses:  Active Hospital Problems   Diagnosis Date Noted   AMS (altered mental status) 05/14/2019   Left leg DVT (HCC) 05/14/2019   Stage 3b chronic kidney disease 05/14/2019   Chronic congestive heart failure (HCC) 01/10/2019   Hyperlipidemia 06/19/2016   Vascular dementia without behavioral disturbance (HCC) 06/08/2014   Essential hypertension    Chronic atrial fibrillation (HCC)    COPD (chronic obstructive pulmonary disease) (HCC)     Resolved Hospital Problems  No resolved problems to display.    Discharge Condition: Stable  Diet recommendation: Resume previous diet  Vitals:   05/18/19 0407 05/18/19 0756  BP: (!) 160/78 (!) 172/81  Pulse: 83 79  Resp: 19 18  Temp: 97.6 F (36.4 C) 98 F (36.7 C)  SpO2:  96%    History of present illness:  FMB:Wanda J Nelsonis a 83 y.o.femalewith medical history significant ofCVA; HTN; HLD; COPD; dementia; and afib presenting with AMS.Patient has advanced dementia and is unable to answer questions. I called and discussed the patient's husband. He reports that a caregiver was there when episode happened. Sat and ate a few bites, looked like she was walking to fall out of the chair and so they laid her in the floor and called 911. She did not lose consciousness. She has been refusing her medications some, would not eat or take meds the last few days.    ED Course:Dementia, presenting with AMS, ?history. Slumped over, lost control of bowel/bladder. Back to baseline upon EMS arrival. ?LLE weakness. LLE pain, +DVT despite  Coumadin for afib (INR subtherapeutic). ?CVA/TIA, seizure. Seen by neurology.  No evidence of acute CVA on MRI brain.  EEG did not show seizure activity.  05/18/19: Patient was seen and examined at her bedside this morning.  No acute events overnight.  She has no new complaints.  She is alert and pleasant in a state of dementia.   Hospital Course:  Principal Problem:   AMS (altered mental status) Active Problems:   COPD (chronic obstructive pulmonary disease) (HCC)   Chronic atrial fibrillation (HCC)   Essential hypertension   Hyperlipidemia   Vascular dementia without behavioral disturbance (HCC)   Chronic congestive heart failure (HCC)   Left leg DVT (HCC)   Stage 3b chronic kidney disease  Resolved acute metabolic encephalopathy in the context of dementia/CVA work-up, ruled out/seizure work-up, EEG negative with no seizure-like movement while inpatient. Presented from home where she was slumped over and was incontinent of bowel and bladder Differential diagnosis includes seizure versus TIA/stroke versus syncope versus transient alteration of consciousness in the context of advanced dementia. MRI brain without contrast MRI neck without contrast unremarkable for any acute findings.  No sign of acute CVA.  No large vessel stenosis or occlusion. Presented with subtherapeutic INR, 1.2 in the setting of chronic A. fib on Coumadin with medication noncompliance. 2D echo done on 05/14/2019 >>LVEF 40-45% with diffused global hypokinesis EEG no evidence of seizure activity.  Mild generalized slowing. Seen by neurology.  Newly diagnosed acute left lower extremity DVT Coumadin held, was on hep drip>> until 05/16/19 Switched to eliquis on 05/16/19, continue   Uncontrolled hypertension, complicated by A. fib  with slow ventricular response Decreased dose of Lopressor 12.5 mg twice daily Continue hydralazine, increase dose to 100 mg 3 times daily Restarted losartan 50 mg daily. Restarted  p.o. Lasix 40 mg daily. Continue Kcl- po 20 meq daily while on diuretics. Follow-up with primary care provider  Resolved AKI on CKD 2, likely prerenal in the setting of dehydration. Baseline creatinine appears to be 1.1 with GFR of 47 Presented with creatinine of 1.4 with GFR of 35 Creatinine 0.9 with GFR of 60 on 05/16/2019 Continue to avoid nephrotoxins   Chronic A. fib on Coumadin Presented with INR 1.2 Goal INR between 2 and 3: Was on Coumadin Coumadin was held Switched to Eliquis on 05/16/2019 Follow-up with your cardiologist  Chronic systolic CHF Euvolemic on exam 2D echo done during this admission showed LVEF 40 to 45% Continue strict I's and O's and daily weight Continue current cardiac medications Follow-up with your cardiologist outpatient  Isolated elevated bilirubin Alkaline phosphatase,  AST ALT normal T bili 1.6 from 1.7 Follow up with your PCP  Dementia Reorient as needed Continue fall precautions  Hyperlipidemia LDL 85 on 05/16/2019 Continue Lipitor 20 mg daily  Hypothyroidism Continue levothyroxine 12.5 mg daily  Physical debility PT OT assessment recommended SNF CSW assisting with SNF placement Continue PT OT with assistance and fall precautions    Code Status:DNR- confirmed with family   Consults called:Neurology; PT/OT/ST/SW/Nutrition   Discharge Exam: BP (!) 172/81 (BP Location: Left Arm)    Pulse 79    Temp 98 F (36.7 C) (Oral)    Resp 18    Ht  (1.676 m)    Wt 77.1 kg    SpO2 96%    BMI 27.44 kg/m   General: 83 y.o. year-old female well-developed well-nourished in no acute distress.  Alert and interactive in a state of dementia.  Cardiovascular: Irregular rate and rhythm no rubs or gallops no JVD or thyromegaly noted.  Respiratory: Clear to auscultation no wheezes or rales.  Good inspiratory effort.  Abdomen: Soft nontender nondistended normal bowel sounds present.    Musculoskeletal: Trace edema lower  extremities bilaterally.  Psychiatry: Mood is appropriate for condition and setting.  Discharge Instructions You were cared for by a hospitalist during your hospital stay. If you have any questions about your discharge medications or the care you received while you were in the hospital after you are discharged, you can call the unit and asked to speak with the hospitalist on call if the hospitalist that took care of you is not available. Once you are discharged, your primary care physician will handle any further medical issues. Please note that NO REFILLS for any discharge medications will be authorized once you are discharged, as it is imperative that you return to your primary care physician (or establish a relationship with a primary care physician if you do not have one) for your aftercare needs so that they can reassess your need for medications and monitor your lab values.   Allergies as of 05/18/2019      Reactions   Codeine    REACTION: vomiting      Medication List    STOP taking these medications   warfarin 5 MG tablet Commonly known as: COUMADIN     TAKE these medications   acetaminophen 500 MG tablet Commonly known as: TYLENOL Take 1,000 mg by mouth every 6 (six) hours as needed for headache (pain).   AeroChamber Plus inhaler Use as instructed   albuterol 108 (90 Base) MCG/ACT  inhaler Commonly known as: VENTOLIN HFA Inhale 2 puffs into the lungs every 6 (six) hours as needed for wheezing or shortness of breath.   apixaban 5 MG Tabs tablet Commonly known as: Eliquis Take 2 tablets (10mg ) twice daily for 7 days, then 1 tablet (5mg ) twice daily   atorvastatin 20 MG tablet Commonly known as: LIPITOR Take 1 tablet (20 mg total) by mouth daily. What changed: when to take this   feeding supplement (ENSURE ENLIVE) Liqd Take 237 mLs by mouth 3 (three) times daily between meals for 7 days.   furosemide 40 MG tablet Commonly known as: LASIX Take 1 tablet (40 mg total)  by mouth daily. What changed: when to take this   hydrALAZINE 100 MG tablet Commonly known as: APRESOLINE Take 1 tablet (100 mg total) by mouth every 8 (eight) hours.   levothyroxine 25 MCG tablet Commonly known as: SYNTHROID Take 0.5 tablets (12.5 mcg total) by mouth daily before breakfast.   losartan 100 MG tablet Commonly known as: COZAAR Take 1 tablet (100 mg total) by mouth daily.   metoprolol tartrate 25 MG tablet Commonly known as: LOPRESSOR Take 0.5 tablets (12.5 mg total) by mouth 2 (two) times daily.   Namzaric 28-10 MG Cp24 Generic drug: Memantine HCl-Donepezil HCl Take 1 capsule by mouth at bedtime. What changed: when to take this   potassium chloride SA 20 MEQ tablet Commonly known as: KLOR-CON Take 1 tablet (20 mEq total) by mouth daily. What changed: when to take this      Allergies  Allergen Reactions   Codeine     REACTION: vomiting    Contact information for follow-up providers    Gwenlyn FudgeJoyce, Britney F, FNP. Call in 1 day(s).   Specialty: Family Medicine Why: Please call for a post hospital follow-up appointment. Contact information: [address] [place] [address] [phone]        Jonelle SidleMcDowell, Samuel G, MD Follow up.   Specialty: Cardiology Why: Please call for a post hospital follow-up appointment. Contact information: [address] [place] [address] [phone]            Contact information for after-discharge care    Destination    [facility] Preferred SNF .   Service: Skilled Nursing Contact information: [address] [place] [id number] [phone]                   The results of significant diagnostics from this hospitalization (including imaging, microbiology, ancillary and laboratory) are listed below for reference.    Significant Diagnostic Studies: Dg Chest 2 View  Result Date: 05/14/2019 CLINICAL DATA:  Syncope EXAM: CHEST - 2 VIEW COMPARISON:  04/07/2018 FINDINGS:  Stable cardiomediastinal contours. Calcific aortic knob. Unchanged appearance of patchy interstitial opacity the medial aspect of the right lower lobe and peripheral aspect of the left lower lobe, likely chronic scarring or atelectasis. No new focal airspace consolidation no pleural effusion. No pneumothorax. No acute osseous findings. IMPRESSION: 1. No active cardiopulmonary disease. 2. Stable appearance of patchy interstitial opacity in the right lower lobe and peripheral aspect of the left lower lobe, likely scarring versus atelectasis. Electronically Signed   By: Duanne GuessNicholas  Plundo M.D.   On: 05/14/2019 13:37   Ct Head Wo Contrast  Result Date: 05/14/2019 CLINICAL DATA:  Altered level of consciousness. Bowel incontinence. EXAM: CT HEAD WITHOUT CONTRAST TECHNIQUE: Contiguous axial images were obtained from the base of the skull through the vertex without intravenous contrast. COMPARISON:  11/21/2013 head CT. FINDINGS: Brain: Chronic right frontal and right occipital encephalomalacia. No evidence of parenchymal hemorrhage or extra-axial fluid collection. No mass lesion, mass effect, or midline shift. No CT evidence of acute infarction. Nonspecific moderate subcortical and periventricular white matter hypodensity, most in keeping with chronic small vessel ischemic change. Generalized cerebral volume loss. Cerebral ventricle sizes are concordant with the degree of cerebral volume loss. Vascular: No acute abnormality. Skull: No evidence of calvarial fracture. Sinuses/Orbits: The visualized paranasal sinuses are essentially clear. Other:  The mastoid air cells are unopacified. IMPRESSION: 1. No evidence of acute intracranial abnormality. 2. Chronic right frontal and right occipital encephalomalacia. 3. Generalized cerebral volume loss and moderate chronic small vessel ischemic changes in the cerebral white matter. Electronically Signed   By: Delbert Phenix M.D.   On: 05/14/2019 13:30   Mr Angio Neck Wo  Contrast  Result Date: 05/15/2019 CLINICAL DATA:  83 year old female with dementia and atrial fibrillation presenting with altered mental status. EXAM: MRI HEAD WITHOUT CONTRAST MRA NECK WITHOUT CONTRAST TECHNIQUE: Multiplanar, multiecho pulse sequences of the brain and surrounding structures were obtained without intravenous contrast. Angiographic images of the neck were obtained using MRA technique with and without intravenous contrast. Carotid stenosis measurements (when applicable) are obtained utilizing NASCET criteria, using the distal internal carotid diameter as the denominator. CONTRAST:  None. COMPARISON:  CT head 05/14/2019. Remote MR head 12/03/2005 FINDINGS: MRI HEAD FINDINGS Patient was uncooperative. Some series are degraded by motion or could not can be completed. Brain: No evidence for acute infarction, hemorrhage, mass lesion, hydrocephalus, or extra-axial fluid. Generalized atrophy. Remote RIGHT frontal infarct/hematoma, with chronic blood products layering the encephalomalacia cavity. Also remote RIGHT occipital infarct is noted. Both these abnormalities have been present since 2007. T2 and FLAIR hyperintensities in the white matter favored to represent chronic microvascular ischemic change. Vascular: Flow voids are maintained. Skull and upper cervical spine: Normal marrow signal. Mild pannus. Sinuses/Orbits: No acute findings. Other: None. MRA NECK FINDINGS Noncontrast examination was performed. Conventional branching of the great vessels from the arch without visible proximal stenosis. Carotid bifurcations demonstrate no flow reducing lesion. Posterior wall plaque at the LEFT carotid bifurcation is suggested, without findings to suggest greater than 50% stenosis. BILATERAL vertebral arteries are patent, RIGHT dominant. IMPRESSION: 1. No acute intracranial findings. Atrophy and small vessel disease. Remote ischemic changes as described. 2. No extracranial or intracranial flow reducing lesion  is evident. Electronically Signed   By: Elsie Stain M.D.   On: 05/15/2019 13:58   Mr Brain Wo Contrast  Result Date: 05/15/2019 CLINICAL DATA:  83 year old female with dementia and atrial fibrillation presenting with altered mental status. EXAM: MRI HEAD WITHOUT CONTRAST MRA NECK WITHOUT CONTRAST TECHNIQUE: Multiplanar, multiecho pulse sequences of the brain and surrounding structures were obtained without intravenous contrast. Angiographic images of the neck were obtained using MRA technique with and without intravenous contrast. Carotid stenosis measurements (when applicable) are obtained utilizing NASCET criteria, using the distal internal carotid diameter as the denominator. CONTRAST:  None. COMPARISON:  CT head 05/14/2019. Remote MR head 12/03/2005 FINDINGS: MRI HEAD FINDINGS Patient was uncooperative. Some series are degraded by motion or could not can be completed. Brain: No evidence for acute infarction, hemorrhage, mass lesion, hydrocephalus, or extra-axial fluid. Generalized atrophy. Remote RIGHT frontal infarct/hematoma, with chronic blood products layering the encephalomalacia cavity. Also remote RIGHT occipital infarct is noted. Both these abnormalities have been present since 2007. T2 and FLAIR hyperintensities in the white matter favored to represent chronic microvascular ischemic  change. Vascular: Flow voids are maintained. Skull and upper cervical spine: Normal marrow signal. Mild pannus. Sinuses/Orbits: No acute findings. Other: None. MRA NECK FINDINGS Noncontrast examination was performed. Conventional branching of the great vessels from the arch without visible proximal stenosis. Carotid bifurcations demonstrate no flow reducing lesion. Posterior wall plaque at the LEFT carotid bifurcation is suggested, without findings to suggest greater than 50% stenosis. BILATERAL vertebral arteries are patent, RIGHT dominant. IMPRESSION: 1. No acute intracranial findings. Atrophy and small vessel  disease. Remote ischemic changes as described. 2. No extracranial or intracranial flow reducing lesion is evident. Electronically Signed   By: Staci Righter M.D.   On: 05/15/2019 13:58   Vas [place] Lower Extremity Venous (dvt) (only Mc & Wl 7a-7p)  Result Date: 05/15/2019  Lower Venous Study Indications: Pain, Swelling, and syncope.  Comparison Study: No prior Performing Technologist: Sharion Dove RVS  Examination Guidelines: A complete evaluation includes B-mode imaging, spectral Doppler, color Doppler, and power Doppler as needed of all accessible portions of each vessel. Bilateral testing is considered an integral part of a complete examination. Limited examinations for reoccurring indications may be performed as noted.  +-----+---------------+---------+-----------+----------+--------------+  RIGHT Compressibility Phasicity Spontaneity Properties Thrombus Aging  +-----+---------------+---------+-----------+----------+--------------+  CFV   Full            Yes       Yes                                    +-----+---------------+---------+-----------+----------+--------------+   +---------+---------------+---------+-----------+----------+--------------+  LEFT      Compressibility Phasicity Spontaneity Properties Thrombus Aging  +---------+---------------+---------+-----------+----------+--------------+  CFV       Full            Yes       Yes                                    +---------+---------------+---------+-----------+----------+--------------+  SFJ       Full                                                             +---------+---------------+---------+-----------+----------+--------------+  FV Prox   Full                                                             +---------+---------------+---------+-----------+----------+--------------+  FV Mid    Full                                                             +---------+---------------+---------+-----------+----------+--------------+  FV  Distal Full                                                             +---------+---------------+---------+-----------+----------+--------------+  PFV       Full                                                             +---------+---------------+---------+-----------+----------+--------------+  POP       Full            Yes       Yes                                    +---------+---------------+---------+-----------+----------+--------------+  PTV       None                                             Acute           +---------+---------------+---------+-----------+----------+--------------+  PERO      None                                             Acute           +---------+---------------+---------+-----------+----------+--------------+     Summary: Right: No evidence of common femoral vein obstruction. Left: Findings consistent with acute deep vein thrombosis involving the left posterior tibial veins, and left peroneal veins.  *See table(s) above for measurements and observations. Electronically signed by Lemar Livings MD on 05/15/2019 at 10:58:36 AM.    Final     Microbiology: Recent Results (from the past 240 hour(s))  Urine culture     Status: Abnormal   Collection Time: 05/14/19 12:58 PM   Specimen: Urine, Clean Catch  Result Value Ref Range Status   Specimen Description URINE, CLEAN CATCH  Final   Special Requests   Final    NONE Performed at [facility], [address] [place], [place] [address]    Culture MULTIPLE SPECIES PRESENT, SUGGEST RECOLLECTION (A)  Final   Report Status 05/15/2019 FINAL  Final  SARS CORONAVIRUS 2 (TAT 6-24 HRS) Nasopharyngeal Nasopharyngeal Swab     Status: None   Collection Time: 05/14/19  3:35 PM   Specimen: Nasopharyngeal Swab  Result Value Ref Range Status   SARS Coronavirus 2 NEGATIVE NEGATIVE Final    Comment: (NOTE) SARS-CoV-2 target nucleic acids are NOT DETECTED. The SARS-CoV-2 RNA is generally detectable in upper and lower respiratory  specimens during the acute phase of infection. Negative results do not preclude SARS-CoV-2 infection, do not rule out co-infections with other pathogens, and should not be used as the sole basis for treatment or other patient management decisions. Negative results must be combined with clinical observations, patient history, and epidemiological information. The expected result is Negative. Fact Sheet for Patients: HairSlick.no Fact Sheet for Healthcare Providers: quierodirigir.com This test is not yet approved or cleared by the [place] FDA and  has been authorized for detection and/or diagnosis of SARS-CoV-2 by FDA under an Emergency Use Authorization (EUA). This EUA will remain  in effect (meaning this test can be used) for the duration of the COVID-19 declaration under Section 56 4(b)(1) of  the Act, 21 U.S.C. section 360bbb-3(b)(1), unless the authorization is terminated or revoked sooner. Performed at [facility], [address] [place], [place] [address]      Labs: Basic Metabolic Panel: Recent Labs  Lab 05/14/19 1230 05/16/19 0411 05/17/19 0305 05/18/19 0233  NA 142 141 140 142  K 3.8 3.1* 4.0 3.9  CL 104 107 108 108  CO2 23 24 23 22   GLUCOSE 134* 97 94 106*  BUN 22 14 12 19   CREATININE 1.40* 0.91 0.92 1.11*  CALCIUM 9.3 8.8* 9.3 9.2   Liver Function Tests: Recent Labs  Lab 05/14/19 1230 05/16/19 0411  AST 33 25  ALT 25 20  ALKPHOS 93 74  BILITOT 1.7* 1.6*  PROT 6.9 6.0*  ALBUMIN 4.0 3.2*   No results for input(s): LIPASE, AMYLASE in the last 168 hours. No results for input(s): AMMONIA in the last 168 hours. CBC: Recent Labs  Lab 05/14/19 1230 05/15/19 0211 05/16/19 0411 05/17/19 0305 05/18/19 0233  WBC 11.6* 7.9 8.5 6.9 9.7  HGB 16.0* 14.0 14.0 15.2* 15.4*  HCT 47.9* 40.6 40.5 43.6 45.5  MCV 95.4 91.9 91.8 91.2 92.5  PLT 252 236 237 260 262   Cardiac Enzymes: No results for  input(s): CKTOTAL, CKMB, CKMBINDEX, TROPONINI in the last 168 hours. BNP: BNP (last 3 results) No results for input(s): BNP in the last 8760 hours.  ProBNP (last 3 results) No results for input(s): PROBNP in the last 8760 hours.  CBG: Recent Labs  Lab 05/14/19 1251  GLUCAP 130*       Signed:  Darlin Droparole N Earnestene Angello, MD [facility] 05/18/2019, 10:11 AM

## 2019-05-18 NOTE — Care Management (Signed)
CM spoke with Cleon Dew admission coordinator for [facility] in [place] provided covering [place] phone number to liaison in case Josem Kaufmann is obtained today.  COVID test ordered this am.

## 2019-05-19 LAB — DIFFERENTIAL
Abs Immature Granulocytes: 0.06 10*3/uL (ref 0.00–0.07)
Basophils Absolute: 0.1 10*3/uL (ref 0.0–0.1)
Basophils Relative: 0 %
Eosinophils Absolute: 0.3 10*3/uL (ref 0.0–0.5)
Eosinophils Relative: 2 %
Immature Granulocytes: 0 %
Lymphocytes Relative: 8 %
Lymphs Abs: 1.3 10*3/uL (ref 0.7–4.0)
Monocytes Absolute: 1 10*3/uL (ref 0.1–1.0)
Monocytes Relative: 6 %
Neutro Abs: 13.6 10*3/uL — ABNORMAL HIGH (ref 1.7–7.7)
Neutrophils Relative %: 84 %

## 2019-05-19 LAB — CBC
HCT: 45.3 % (ref 36.0–46.0)
HCT: 46.5 % — ABNORMAL HIGH (ref 36.0–46.0)
Hemoglobin: 15 g/dL (ref 12.0–15.0)
Hemoglobin: 15.6 g/dL — ABNORMAL HIGH (ref 12.0–15.0)
MCH: 31.1 pg (ref 26.0–34.0)
MCH: 31.6 pg (ref 26.0–34.0)
MCHC: 33.1 g/dL (ref 30.0–36.0)
MCHC: 33.5 g/dL (ref 30.0–36.0)
MCV: 94 fL (ref 80.0–100.0)
MCV: 94.1 fL (ref 80.0–100.0)
Platelets: 261 10*3/uL (ref 150–400)
Platelets: 249 10*3/uL (ref 150–400)
RBC: 4.82 MIL/uL (ref 3.87–5.11)
RBC: 4.94 MIL/uL (ref 3.87–5.11)
RDW: 13.2 % (ref 11.5–15.5)
RDW: 13.4 % (ref 11.5–15.5)
WBC: 16.9 10*3/uL — ABNORMAL HIGH (ref 4.0–10.5)
WBC: 16.2 10*3/uL — ABNORMAL HIGH (ref 4.0–10.5)
nRBC: 0 % (ref 0.0–0.2)
nRBC: 0 % (ref 0.0–0.2)

## 2019-05-19 LAB — CBC WITH DIFFERENTIAL/PLATELET

## 2019-05-19 LAB — BASIC METABOLIC PANEL
Anion gap: 11 (ref 5–15)
BUN: 29 mg/dL — ABNORMAL HIGH (ref 8–23)
CO2: 23 mmol/L (ref 22–32)
Calcium: 9.2 mg/dL (ref 8.9–10.3)
Chloride: 109 mmol/L (ref 98–111)
Creatinine, Ser: 1.23 mg/dL — ABNORMAL HIGH (ref 0.44–1.00)
GFR calc Af Amer: 48 mL/min — ABNORMAL LOW (ref >60)
GFR calc non Af Amer: 41 mL/min — ABNORMAL LOW (ref >60)
Glucose, Bld: 116 mg/dL — ABNORMAL HIGH (ref 70–99)
Potassium: 3.5 mmol/L (ref 3.5–5.1)
Sodium: 143 mmol/L (ref 135–145)

## 2019-05-19 LAB — PROCALCITONIN: Procalcitonin: <0.1 ng/mL

## 2019-05-19 LAB — LACTIC ACID, PLASMA: Lactic Acid, Venous: 1 mmol/L (ref 0.5–1.9)

## 2019-05-19 NOTE — Progress Notes (Signed)
Discharge Summary  Wanda Mckenzie JOA:[id number] DOB: 09-09-1935  PCP: [name], FNP  Admit date: 05/14/2019 Discharge date: 05/19/2019  Time spent: 35 minutes  Recommendations for Outpatient Follow-up:  1. Follow-up with your PCP 2. Follow-up with your cardiologist 3. Take your medications as prescribed 4. Continue physical therapy 5. Fall precautions  Discharge Diagnoses:  Active Hospital Problems   Diagnosis Date Noted   AMS (altered mental status) 05/14/2019   Left leg DVT (HCC) 05/14/2019   Stage 3b chronic kidney disease 05/14/2019   Chronic congestive heart failure (HCC) 01/10/2019   Hyperlipidemia 06/19/2016   Vascular dementia without behavioral disturbance (HCC) 06/08/2014   Essential hypertension    Chronic atrial fibrillation (HCC)    COPD (chronic obstructive pulmonary disease) (HCC)     Resolved Hospital Problems  No resolved problems to display.    Discharge Condition: Stable  Diet recommendation: Resume previous diet  Vitals:   05/19/19 0340 05/19/19 0729  BP: 139/67 137/67  Pulse: 75 84  Resp: 16 16  Temp: 97.6 F (36.4 C) 99 F (37.2 C)  SpO2: 95% 97%    History of present illness:  SWF:UXNATF J Nelsonis a 83 y.o.femalewith medical history significant ofCVA; HTN; HLD; COPD; dementia; and afib presenting with AMS.Patient has advanced dementia and is unable to answer questions. I called and discussed the patient's husband. He reports that a caregiver was there when episode happened. Sat and ate a few bites, looked like she was walking to fall out of the chair and so they laid her in the floor and called 911. She did not lose consciousness. She has been refusing her medications some, would not eat or take meds the last few days.    ED Course:Dementia, presenting with AMS, ?history. Slumped over, lost control of bowel/bladder. Back to baseline upon EMS arrival. ?LLE weakness. LLE pain, +DVT despite Coumadin for  afib (INR subtherapeutic). ?CVA/TIA, seizure. Seen by neurology.  No evidence of acute CVA on MRI brain.  EEG did not show seizure activity.  11/25: Patient was seen and examined at her bedside this morning.  No acute events overnight.  She has no new complaints.  She is alert and pleasant in a state of dementia.  05/19/19: Patient was seen and examined at her bedside this morning.  No acute events overnight.  She has no new complaints.  She denies chest pain, cough, dyspnea, abdominal pain, nausea, lower extremity pain.  She does not appear septic.  Leukocytosis noted this morning with WBC increased to 16,000.  Procalcitonin and lactic acid negative.  Repeating CBC today to rule out lab error.  Afebrile with no sign of active infective process.  Repeat COVID-19 done on 05/18/2019 was negative.   Hospital Course:  Principal Problem:   AMS (altered mental status) Active Problems:   COPD (chronic obstructive pulmonary disease) (HCC)   Chronic atrial fibrillation (HCC)   Essential hypertension   Hyperlipidemia   Vascular dementia without behavioral disturbance (HCC)   Chronic congestive heart failure (HCC)   Left leg DVT (HCC)   Stage 3b chronic kidney disease  Resolved acute metabolic encephalopathy in the context of dementia/CVA work-up, ruled out/seizure work-up, EEG negative with no seizure-like movement while inpatient. Presented from home where she was slumped over and was incontinent of bowel and bladder Differential diagnosis includes seizure versus TIA/stroke versus syncope versus transient alteration of consciousness in the context of advanced dementia. MRI brain without contrast MRI neck without contrast unremarkable for any acute findings.  No  sign of acute CVA.  No large vessel stenosis or occlusion. Presented with subtherapeutic INR, 1.2 in the setting of chronic A. fib on Coumadin with medication noncompliance. 2D echo done on 05/14/2019 >>LVEF 40-45% with diffused global  hypokinesis EEG no evidence of seizure activity.  Mild generalized slowing. Seen by neurology.  Newly diagnosed acute left lower extremity DVT Coumadin held, was on hep drip>> until 05/16/19 Switched to eliquis on 05/16/19, continue   Leukocytosis, suspect reactive.   White count jumped this morning to 16,000 No sign of infectious process Urine culture done on 05/14/2019 suggested recollection Does not appear septic She has no complaints Procalcitonin lactic acid on 05/19/2019 - Repeat CBC  Resolved uncontrolled hypertension, complicated by A. fib with slow ventricular response Decreased dose of Lopressor 12.5 mg twice daily Continue hydralazine, increase dose to 100 mg 3 times daily Restarted losartan 50 mg daily. Restarted p.o. Lasix 40 mg daily. Continue Kcl- po 20 meq daily while on diuretics. Follow-up with primary care provider  Resolved AKI on CKD 2, likely prerenal in the setting of dehydration. Baseline creatinine appears to be 1.1 with GFR of 47 Presented with creatinine of 1.4 with GFR of 35 Creatinine 0.9 with GFR of 60 on 05/16/2019 Continue to avoid nephrotoxins   Chronic A. fib on Coumadin Presented with INR 1.2 Goal INR between 2 and 3: Was on Coumadin Coumadin was held Switched to Eliquis on 05/16/2019 Follow-up with your cardiologist  Chronic systolic CHF Euvolemic on exam 2D echo done during this admission showed LVEF 40 to 45% Continue strict I's and O's and daily weight Continue current cardiac medications Follow-up with your cardiologist outpatient  Isolated elevated bilirubin Alkaline phosphatase,  AST ALT normal T bili 1.6 from 1.7 Follow up with your PCP  Dementia Reorient as needed Continue fall precautions  Hyperlipidemia LDL 85 on 05/16/2019 Continue Lipitor 20 mg daily  Hypothyroidism Continue levothyroxine 12.5 mg daily  Physical debility PT OT assessment recommended SNF CSW assisting with SNF placement Continue PT OT  with assistance and fall precautions    Code Status:DNR- confirmed with family   Consults called:Neurology; PT/OT/ST/SW/Nutrition   Discharge Exam: BP 137/67 (BP Location: Right Arm)    Pulse 84    Temp 99 F (37.2 C) (Oral)    Resp 16    Ht 5\' 6"  (1.676 m)    Wt 77.1 kg    SpO2 97%    BMI 27.44 kg/m   General: 83 y.o. year-old female well-developed well-nourished in no acute distress.  Alert and pleasant in the state of dementia.  Cardiovascular: Irregular rate and rhythm no rubs or gallops no JVD or thyromegaly noted.    Respiratory: Clear auscultation no wheezes or rales.  Poor respiratory effort.    Abdomen: Soft nontender nondistended normal bowel sounds present.    Musculoskeletal: Trace edema lower extremities bilaterally.  Psychiatry: Mood is appropriate for condition and setting.  Discharge Instructions You were cared for by a hospitalist during your hospital stay. If you have any questions about your discharge medications or the care you received while you were in the hospital after you are discharged, you can call the unit and asked to speak with the hospitalist on call if the hospitalist that took care of you is not available. Once you are discharged, your primary care physician will handle any further medical issues. Please note that NO REFILLS for any discharge medications will be authorized once you are discharged, as it is imperative that you return to your  primary care physician (or establish a relationship with a primary care physician if you do not have one) for your aftercare needs so that they can reassess your need for medications and monitor your lab values.   Allergies as of 05/19/2019      Reactions   Codeine    REACTION: vomiting      Medication List    STOP taking these medications   warfarin 5 MG tablet Commonly known as: COUMADIN     TAKE these medications   acetaminophen 500 MG tablet Commonly known as: TYLENOL Take 1,000 mg by mouth  every 6 (six) hours as needed for headache (pain).   AeroChamber Plus inhaler Use as instructed   albuterol 108 (90 Base) MCG/ACT inhaler Commonly known as: VENTOLIN HFA Inhale 2 puffs into the lungs every 6 (six) hours as needed for wheezing or shortness of breath.   apixaban 5 MG Tabs tablet Commonly known as: Eliquis Take 2 tablets (10mg ) twice daily for 7 days, then 1 tablet (5mg ) twice daily   atorvastatin 20 MG tablet Commonly known as: LIPITOR Take 1 tablet (20 mg total) by mouth daily. What changed: when to take this   feeding supplement (ENSURE ENLIVE) Liqd Take 237 mLs by mouth 3 (three) times daily between meals for 7 days.   furosemide 40 MG tablet Commonly known as: LASIX Take 1 tablet (40 mg total) by mouth daily. What changed: when to take this   hydrALAZINE 100 MG tablet Commonly known as: APRESOLINE Take 1 tablet (100 mg total) by mouth every 8 (eight) hours.   levothyroxine 25 MCG tablet Commonly known as: SYNTHROID Take 0.5 tablets (12.5 mcg total) by mouth daily before breakfast.   losartan 100 MG tablet Commonly known as: COZAAR Take 1 tablet (100 mg total) by mouth daily.   metoprolol tartrate 25 MG tablet Commonly known as: LOPRESSOR Take 0.5 tablets (12.5 mg total) by mouth 2 (two) times daily.   Namzaric 28-10 MG Cp24 Generic drug: Memantine HCl-Donepezil HCl Take 1 capsule by mouth at bedtime. What changed: when to take this   potassium chloride SA 20 MEQ tablet Commonly known as: KLOR-CON Take 1 tablet (20 mEq total) by mouth daily. What changed: when to take this      Allergies  Allergen Reactions   Codeine     REACTION: vomiting    Contact information for follow-up providers    Loman Brooklyn, FNP. Call in 1 day(s).   Specialty: Family Medicine Why: Please call for a post hospital follow-up appointment. Contact information: [place] [address] [phone]        Satira Sark, MD Follow up.     Specialty: Cardiology Why: Please call for a post hospital follow-up appointment. Contact information: [place] [address] [phone]            Contact information for after-discharge care    [facility] .   Service: Skilled Nursing Contact information: [address] [phone]                   The results of significant diagnostics from this hospitalization (including imaging, microbiology, ancillary and laboratory) are listed below for reference.    Significant Diagnostic Studies: Dg Chest 2 View  Result Date: 05/14/2019 CLINICAL DATA:  Syncope EXAM: CHEST - 2 VIEW COMPARISON:  04/07/2018 FINDINGS: Stable cardiomediastinal contours. Calcific aortic knob. Unchanged appearance  of patchy interstitial opacity the medial aspect of the right lower lobe and peripheral aspect of the left lower lobe, likely chronic scarring or atelectasis. No new focal airspace consolidation no pleural effusion. No pneumothorax. No acute osseous findings. IMPRESSION: 1. No active cardiopulmonary disease. 2. Stable appearance of patchy interstitial opacity in the right lower lobe and peripheral aspect of the left lower lobe, likely scarring versus atelectasis. Electronically Signed   By: Duanne GuessNicholas  Plundo M.D.   On: 05/14/2019 13:37   Ct Head Wo Contrast  Result Date: 05/14/2019 CLINICAL DATA:  Altered level of consciousness. Bowel incontinence. EXAM: CT HEAD WITHOUT CONTRAST TECHNIQUE: Contiguous axial images were obtained from the base of the skull through the vertex without intravenous contrast. COMPARISON:  11/21/2013 head CT. FINDINGS: Brain: Chronic right frontal and right occipital encephalomalacia. No evidence of parenchymal hemorrhage or extra-axial fluid collection. No mass lesion, mass effect, or midline shift. No CT evidence of acute infarction. Nonspecific moderate subcortical and  periventricular white matter hypodensity, most in keeping with chronic small vessel ischemic change. Generalized cerebral volume loss. Cerebral ventricle sizes are concordant with the degree of cerebral volume loss. Vascular: No acute abnormality. Skull: No evidence of calvarial fracture. Sinuses/Orbits: The visualized paranasal sinuses are essentially clear. Other:  The mastoid air cells are unopacified. IMPRESSION: 1. No evidence of acute intracranial abnormality. 2. Chronic right frontal and right occipital encephalomalacia. 3. Generalized cerebral volume loss and moderate chronic small vessel ischemic changes in the cerebral white matter. Electronically Signed   By: Delbert PhenixJason A Poff M.D.   On: 05/14/2019 13:30   Mr Angio Neck Wo Contrast  Result Date: 05/15/2019 CLINICAL DATA:  83 year old female with dementia and atrial fibrillation presenting with altered mental status. EXAM: MRI HEAD WITHOUT CONTRAST MRA NECK WITHOUT CONTRAST TECHNIQUE: Multiplanar, multiecho pulse sequences of the brain and surrounding structures were obtained without intravenous contrast. Angiographic images of the neck were obtained using MRA technique with and without intravenous contrast. Carotid stenosis measurements (when applicable) are obtained utilizing NASCET criteria, using the distal internal carotid diameter as the denominator. CONTRAST:  None. COMPARISON:  CT head 05/14/2019. Remote MR head 12/03/2005 FINDINGS: MRI HEAD FINDINGS Patient was uncooperative. Some series are degraded by motion or could not can be completed. Brain: No evidence for acute infarction, hemorrhage, mass lesion, hydrocephalus, or extra-axial fluid. Generalized atrophy. Remote RIGHT frontal infarct/hematoma, with chronic blood products layering the encephalomalacia cavity. Also remote RIGHT occipital infarct is noted. Both these abnormalities have been present since 2007. T2 and FLAIR hyperintensities in the white matter favored to represent chronic  microvascular ischemic change. Vascular: Flow voids are maintained. Skull and upper cervical spine: Normal marrow signal. Mild pannus. Sinuses/Orbits: No acute findings. Other: None. MRA NECK FINDINGS Noncontrast examination was performed. Conventional branching of the great vessels from the arch without visible proximal stenosis. Carotid bifurcations demonstrate no flow reducing lesion. Posterior wall plaque at the LEFT carotid bifurcation is suggested, without findings to suggest greater than 50% stenosis. BILATERAL vertebral arteries are patent, RIGHT dominant. IMPRESSION: 1. No acute intracranial findings. Atrophy and small vessel disease. Remote ischemic changes as described. 2. No extracranial or intracranial flow reducing lesion is evident. Electronically Signed   By: Elsie StainJohn T Curnes M.D.   On: 05/15/2019 13:58   Mr Brain Wo Contrast  Result Date: 05/15/2019 CLINICAL DATA:  83 year old female with dementia and atrial fibrillation presenting with altered mental status. EXAM: MRI HEAD WITHOUT CONTRAST MRA NECK WITHOUT CONTRAST TECHNIQUE: Multiplanar, multiecho pulse sequences of the brain and surrounding  structures were obtained without intravenous contrast. Angiographic images of the neck were obtained using MRA technique with and without intravenous contrast. Carotid stenosis measurements (when applicable) are obtained utilizing NASCET criteria, using the distal internal carotid diameter as the denominator. CONTRAST:  None. COMPARISON:  CT head 05/14/2019. Remote MR head 12/03/2005 FINDINGS: MRI HEAD FINDINGS Patient was uncooperative. Some series are degraded by motion or could not can be completed. Brain: No evidence for acute infarction, hemorrhage, mass lesion, hydrocephalus, or extra-axial fluid. Generalized atrophy. Remote RIGHT frontal infarct/hematoma, with chronic blood products layering the encephalomalacia cavity. Also remote RIGHT occipital infarct is noted. Both these abnormalities have been  present since 2007. T2 and FLAIR hyperintensities in the white matter favored to represent chronic microvascular ischemic change. Vascular: Flow voids are maintained. Skull and upper cervical spine: Normal marrow signal. Mild pannus. Sinuses/Orbits: No acute findings. Other: None. MRA NECK FINDINGS Noncontrast examination was performed. Conventional branching of the great vessels from the arch without visible proximal stenosis. Carotid bifurcations demonstrate no flow reducing lesion. Posterior wall plaque at the LEFT carotid bifurcation is suggested, without findings to suggest greater than 50% stenosis. BILATERAL vertebral arteries are patent, RIGHT dominant. IMPRESSION: 1. No acute intracranial findings. Atrophy and small vessel disease. Remote ischemic changes as described. 2. No extracranial or intracranial flow reducing lesion is evident. Electronically Signed   By: Elsie Stain M.D.   On: 05/15/2019 13:58   Vas [place] Lower Extremity Venous (dvt) (only Mc & Wl 7a-7p)  Result Date: 05/15/2019  Lower Venous Study Indications: Pain, Swelling, and syncope.  Comparison Study: No prior Performing Technologist: Sherren Kerns RVS  Examination Guidelines: A complete evaluation includes B-mode imaging, spectral Doppler, color Doppler, and power Doppler as needed of all accessible portions of each vessel. Bilateral testing is considered an integral part of a complete examination. Limited examinations for reoccurring indications may be performed as noted.  +-----+---------------+---------+-----------+----------+--------------+  RIGHT Compressibility Phasicity Spontaneity Properties Thrombus Aging  +-----+---------------+---------+-----------+----------+--------------+  CFV   Full            Yes       Yes                                    +-----+---------------+---------+-----------+----------+--------------+   +---------+---------------+---------+-----------+----------+--------------+  LEFT       Compressibility Phasicity Spontaneity Properties Thrombus Aging  +---------+---------------+---------+-----------+----------+--------------+  CFV       Full            Yes       Yes                                    +---------+---------------+---------+-----------+----------+--------------+  SFJ       Full                                                             +---------+---------------+---------+-----------+----------+--------------+  FV Prox   Full                                                             +---------+---------------+---------+-----------+----------+--------------+  FV Mid    Full                                                             +---------+---------------+---------+-----------+----------+--------------+  FV Distal Full                                                             +---------+---------------+---------+-----------+----------+--------------+  PFV       Full                                                             +---------+---------------+---------+-----------+----------+--------------+  POP       Full            Yes       Yes                                    +---------+---------------+---------+-----------+----------+--------------+  PTV       None                                             Acute           +---------+---------------+---------+-----------+----------+--------------+  PERO      None                                             Acute           +---------+---------------+---------+-----------+----------+--------------+     Summary: Right: No evidence of common femoral vein obstruction. Left: Findings consistent with acute deep vein thrombosis involving the left posterior tibial veins, and left peroneal veins.  *See table(s) above for measurements and observations. Electronically signed by Lemar Livings MD on 05/15/2019 at 10:58:36 AM.    Final     Microbiology: Recent Results (from the past 240 hour(s))  Urine culture     Status: Abnormal    Collection Time: 05/14/19 12:58 PM   Specimen: Urine, Clean Catch  Result Value Ref Range Status   Specimen Description URINE, CLEAN CATCH  Final   Special Requests   Final    NONE Performed at [facility], [address], [place], [place] [address]    Culture MULTIPLE SPECIES PRESENT, SUGGEST RECOLLECTION (A)  Final   Report Status 05/15/2019 FINAL  Final  SARS CORONAVIRUS 2 (TAT 6-24 HRS) Nasopharyngeal Nasopharyngeal Swab     Status: None   Collection Time: 05/14/19  3:35 PM   Specimen: Nasopharyngeal Swab  Result Value Ref Range Status   SARS Coronavirus 2 NEGATIVE NEGATIVE Final    Comment: (NOTE) SARS-CoV-2 target nucleic acids are NOT DETECTED. The SARS-CoV-2  RNA is generally detectable in upper and lower respiratory specimens during the acute phase of infection. Negative results do not preclude SARS-CoV-2 infection, do not rule out co-infections with other pathogens, and should not be used as the sole basis for treatment or other patient management decisions. Negative results must be combined with clinical observations, patient history, and epidemiological information. The expected result is Negative. Fact Sheet for Patients: HairSlick.no Fact Sheet for Healthcare Providers: quierodirigir.com This test is not yet approved or cleared by the [place] FDA and  has been authorized for detection and/or diagnosis of SARS-CoV-2 by FDA under an Emergency Use Authorization (EUA). This EUA will remain  in effect (meaning this test can be used) for the duration of the COVID-19 declaration under Section 56 4(b)(1) of the Act, 21 U.S.C. section 360bbb-3(b)(1), unless the authorization is terminated or revoked sooner. Performed at [facility], [address] [place], [place] [address]   SARS CORONAVIRUS 2 (TAT 6-24 HRS) Nasopharyngeal Nasopharyngeal Swab     Status: None   Collection Time: 05/18/19  8:53 AM    Specimen: Nasopharyngeal Swab  Result Value Ref Range Status   SARS Coronavirus 2 NEGATIVE NEGATIVE Final    Comment: (NOTE) SARS-CoV-2 target nucleic acids are NOT DETECTED. The SARS-CoV-2 RNA is generally detectable in upper and lower respiratory specimens during the acute phase of infection. Negative results do not preclude SARS-CoV-2 infection, do not rule out co-infections with other pathogens, and should not be used as the sole basis for treatment or other patient management decisions. Negative results must be combined with clinical observations, patient history, and epidemiological information. The expected result is Negative. Fact Sheet for Patients: HairSlick.no Fact Sheet for Healthcare Providers: quierodirigir.com This test is not yet approved or cleared by the [place] FDA and  has been authorized for detection and/or diagnosis of SARS-CoV-2 by FDA under an Emergency Use Authorization (EUA). This EUA will remain  in effect (meaning this test can be used) for the duration of the COVID-19 declaration under Section 56 4(b)(1) of the Act, 21 U.S.C. section 360bbb-3(b)(1), unless the authorization is terminated or revoked sooner. Performed at [facility], [address], [place], [place] [address]      Labs: Basic Metabolic Panel: Recent Labs  Lab 05/14/19 1230 05/16/19 0411 05/17/19 0305 05/18/19 0233 05/19/19 0323  NA 142 141 140 142 143  K 3.8 3.1* 4.0 3.9 3.5  CL 104 107 108 108 109  CO2 23 24 23 22 23   GLUCOSE 134* 97 94 106* 116*  BUN 22 14 12 19  29*  CREATININE 1.40* 0.91 0.92 1.11* 1.23*  CALCIUM 9.3 8.8* 9.3 9.2 9.2   Liver Function Tests: Recent Labs  Lab 05/14/19 1230 05/16/19 0411  AST 33 25  ALT 25 20  ALKPHOS 93 74  BILITOT 1.7* 1.6*  PROT 6.9 6.0*  ALBUMIN 4.0 3.2*   No results for input(s): LIPASE, AMYLASE in the last 168 hours. No results for input(s): AMMONIA in the  last 168 hours. CBC: Recent Labs  Lab 05/16/19 0411 05/17/19 0305 05/18/19 0233 05/19/19 0323 05/19/19 0544  WBC 8.5 6.9 9.7 16.9* DUPL SEE 05/21/19  NEUTROABS  --   --   --  13.6* PENDING  HGB 14.0 15.2* 15.4* 15.0 DUPL SEE [id number]  HCT 40.5 43.6 45.5 45.3 DUPL SEE [id number]  MCV 91.8 91.2 92.5 94.0 DUPL SEE [id number]  PLT 237 260 262 261 DUPL SEE 05/21/19   Cardiac Enzymes: No results for input(s):  CKTOTAL, CKMB, CKMBINDEX, TROPONINI in the last 168 hours. BNP: BNP (last 3 results) No results for input(s): BNP in the last 8760 hours.  ProBNP (last 3 results) No results for input(s): PROBNP in the last 8760 hours.  CBG: Recent Labs  Lab 05/14/19 1251  GLUCAP 130*       Signed:  Darlin Drop, MD [facility] 05/19/2019, 9:37 AM

## 2019-05-20 LAB — CBC WITH DIFFERENTIAL/PLATELET
Abs Immature Granulocytes: 0.02 10*3/uL (ref 0.00–0.07)
Basophils Absolute: 0.1 10*3/uL (ref 0.0–0.1)
Basophils Relative: 1 %
Eosinophils Absolute: 0.3 10*3/uL (ref 0.0–0.5)
Eosinophils Relative: 2 %
HCT: 42.8 % (ref 36.0–46.0)
Hemoglobin: 14.3 g/dL (ref 12.0–15.0)
Immature Granulocytes: 0 %
Lymphocytes Relative: 14 %
Lymphs Abs: 1.7 10*3/uL (ref 0.7–4.0)
MCH: 31.4 pg (ref 26.0–34.0)
MCHC: 33.4 g/dL (ref 30.0–36.0)
MCV: 93.9 fL (ref 80.0–100.0)
Monocytes Absolute: 0.8 10*3/uL (ref 0.1–1.0)
Monocytes Relative: 6 %
Neutro Abs: 9.1 10*3/uL — ABNORMAL HIGH (ref 1.7–7.7)
Neutrophils Relative %: 77 %
Platelets: 249 10*3/uL (ref 150–400)
RBC: 4.56 MIL/uL (ref 3.87–5.11)
RDW: 13.4 % (ref 11.5–15.5)
WBC: 12 10*3/uL — ABNORMAL HIGH (ref 4.0–10.5)
nRBC: 0 % (ref 0.0–0.2)

## 2019-05-20 LAB — COMPREHENSIVE METABOLIC PANEL
ALT: 20 U/L (ref 0–44)
AST: 22 U/L (ref 15–41)
Albumin: 3.2 g/dL — ABNORMAL LOW (ref 3.5–5.0)
Alkaline Phosphatase: 69 U/L (ref 38–126)
Anion gap: 12 (ref 5–15)
BUN: 33 mg/dL — ABNORMAL HIGH (ref 8–23)
CO2: 24 mmol/L (ref 22–32)
Calcium: 9.1 mg/dL (ref 8.9–10.3)
Chloride: 109 mmol/L (ref 98–111)
Creatinine, Ser: 1.2 mg/dL — ABNORMAL HIGH (ref 0.44–1.00)
GFR calc Af Amer: 49 mL/min — ABNORMAL LOW (ref >60)
GFR calc non Af Amer: 43 mL/min — ABNORMAL LOW (ref >60)
Glucose, Bld: 123 mg/dL — ABNORMAL HIGH (ref 70–99)
Potassium: 3.2 mmol/L — ABNORMAL LOW (ref 3.5–5.1)
Sodium: 145 mmol/L (ref 135–145)
Total Bilirubin: 0.9 mg/dL (ref 0.3–1.2)
Total Protein: 5.8 g/dL — ABNORMAL LOW (ref 6.5–8.1)

## 2019-05-20 LAB — MAGNESIUM: Magnesium: 2.1 mg/dL (ref 1.7–2.4)

## 2019-05-20 MED ORDER — POTASSIUM CHLORIDE CRYS ER 20 MEQ PO TBCR
40.0000 meq | EXTENDED_RELEASE_TABLET | Freq: Two times a day (BID) | ORAL | Status: AC
  Administered 2019-05-20 (×2): 40 meq via ORAL
  Filled 2019-05-20: qty 2

## 2019-05-20 NOTE — Progress Notes (Signed)
Occupational Therapy Treatment Patient Details Name: Wanda Mckenzie MRN: [id number] DOB: 1935/08/12 Today's Date: 05/20/2019    History of present illness  Wanda Mckenzie is a 83 y.o. female with medical history significant of CVA; HTN; HLD; COPD; dementia; and afib presenting with AMS. Admitted for TIA CVA work up/seizure rule out evaluation. CT negative for acute abnormality; pending MRI. On heparin drip for acute left lower extremity DVT.    OT comments  Pt seen for OT f/u session with focus on cognition and functional engagement in BADL. Pt up in chair and provided with table top functional tasks. Pt asked to name common BADL items, required multimodal cueing to name comb and tooth brush. Pt provided with oral care supplies, not able to sequence without single and direct step by step cues. Tactile cues utilized as well, such as action of taking tooth brush to mouth. Noted pt to be undershooting and overshooting when reaching for BADL items. Noted pt also having difficulty scanning, but difficult to distinguish with cognitive issues. SNF remains appropriate at d/c. Will continue to follow.   Follow Up Recommendations  SNF;Supervision/Assistance - 24 hour    Equipment Recommendations  None recommended by OT    Recommendations for Other Services      Precautions / Restrictions Precautions Precautions: Fall Restrictions Weight Bearing Restrictions: No       Mobility Bed Mobility Overal bed mobility: Needs Assistance Bed Mobility: Supine to Sit     Supine to sit: Max assist     General bed mobility comments: up in chair  Transfers Overall transfer level: Needs assistance Equipment used: Rolling walker (2 wheeled) Transfers: Sit to/from [employer] Sit to Stand: Min assist Stand pivot transfers: Min assist       General transfer comment: upon first attempt max assist however once pt provided with multimodal cues and understood task, min assist for  steadying    Balance Overall balance assessment: Needs assistance Sitting-balance support: Feet supported Sitting balance-Leahy Scale: Fair                                     ADL either performed or assessed with clinical judgement   ADL Overall ADL's : Needs assistance/impaired     Grooming: Set up;Sitting;Cueing for sequencing;Cueing for safety Grooming Details (indicate cue type and reason): set up with supplies to brush teeth, pt needing multimodal cueing to name what toothbrush is and what it is used for. Pt reading labels of toothpaste and still needing cues to make connection. A tactile cue to the mouth was needed as well                               General ADL Comments: focused session on cognitive table top tasks using BADL items     Vision   Vision Assessment?: Vision impaired- to be further tested in functional context Additional Comments: difficult to assess given cognition. Pt over/undershooting when reaching for objects from therapist. Also noting to have difficulty locating items/scanning. Suspect some of this cognitive involvement as well   Perception     Praxis      Cognition Arousal/Alertness: Awake/alert Behavior During Therapy: WFL for tasks assessed/performed Overall Cognitive Status: History of cognitive impairments - at baseline  General Comments: hx of dementia. Pt needing multimodal cues to name BADL objects throughout session as well as their actions. Pt doing well with short, concise cues and tactile cues to engage in desired BADL tasks. Increased processing time needed.        Exercises     Shoulder Instructions       General Comments      Pertinent Vitals/ Pain       Pain Assessment: No/denies pain Pain Intervention(s): Monitored during session;Repositioned  Home Living                                          Prior Functioning/Environment               Frequency  Min 2X/week        Progress Toward Goals  OT Goals(current goals can now be found in the care plan section)  Progress towards OT goals: Progressing toward goals  Acute Rehab OT Goals Patient Stated Goal: unable OT Goal Formulation: Patient unable to participate in goal setting Time For Goal Achievement: 05/29/19 Potential to Achieve Goals: Good  Plan Discharge plan remains appropriate;Frequency remains appropriate    Co-evaluation                 AM-PAC OT "6 Clicks" Daily Activity     Outcome Measure   Help from another person eating meals?: A Little Help from another person taking care of personal grooming?: A Little Help from another person toileting, which includes using toliet, bedpan, or urinal?: A Lot Help from another person bathing (including washing, rinsing, drying)?: A Lot Help from another person to put on and taking off regular upper body clothing?: A Lot Help from another person to put on and taking off regular lower body clothing?: A Lot 6 Click Score: 14    End of Session    OT Visit Diagnosis: Unsteadiness on feet (R26.81);Muscle weakness (generalized) (M62.81);Other symptoms and signs involving cognitive function   Activity Tolerance Patient tolerated treatment well   Patient Left in chair;with call bell/phone within reach;with chair alarm set   Nurse Communication Mobility status        Time: 5400-8676 OT Time Calculation (min): 24 min  Charges: OT General Charges $OT Visit: 1 Visit OT Treatments $Self Care/Home Management : 23-37 mins  Wanda Mckenzie, MSOT, OTR/L [place] [facility] Office: [phone]  Wanda Mckenzie 05/20/2019, 4:19 PM

## 2019-05-20 NOTE — Care Management Important Message (Signed)
Important Message  Patient Details  Name: Wanda Mckenzie MRN: [id number] Date of Birth: 03-Jul-1935   Medicare Important Message Given:  Yes     Shelda Altes 05/20/2019, 2:23 PM

## 2019-05-20 NOTE — Progress Notes (Signed)
Discharge Summary  Wanda Mckenzie YCX:[id number] DOB: February 01, 1936  PCP: [name], FNP  Admit date: 05/14/2019 Discharge date: 05/20/2019  Time spent: 35 minutes  Recommendations for Outpatient Follow-up:  1. Follow-up with your PCP 2. Follow-up with your cardiologist 3. Take your medications as prescribed 4. Continue physical therapy 5. Fall precautions  Discharge Diagnoses:  Active Hospital Problems   Diagnosis Date Noted   AMS (altered mental status) 05/14/2019   Left leg DVT (HCC) 05/14/2019   Stage 3b chronic kidney disease 05/14/2019   Chronic congestive heart failure (HCC) 01/10/2019   Hyperlipidemia 06/19/2016   Vascular dementia without behavioral disturbance (HCC) 06/08/2014   Essential hypertension    Chronic atrial fibrillation (HCC)    COPD (chronic obstructive pulmonary disease) (HCC)     Resolved Hospital Problems  No resolved problems to display.    Discharge Condition: Stable  Diet recommendation: Resume previous diet  Vitals:   05/20/19 0721 05/20/19 1138  BP: (!) 153/63 129/63  Pulse: 80 76  Resp: 18 18  Temp: 98.5 F (36.9 C) 97.7 F (36.5 C)  SpO2: 94% 97%    History of present illness:  Wanda J Nelsonis a 83 y.o.femalewith medical history significant ofCVA; HTN; HLD; COPD; dementia; and afib presenting with AMS.Patient has advanced dementia and is unable to answer questions. I called and discussed the patient's husband. He reports that a caregiver was there when episode happened. Sat and ate a few bites, looked like she was walking to fall out of the chair and so they laid her in the floor and called 911. She did not lose consciousness. She has been refusing her medications some, would not eat or take meds the last few days.    ED Course:Dementia, presenting with AMS, ?history. Slumped over, lost control of bowel/bladder. Back to baseline upon EMS arrival. ?LLE weakness. LLE pain, +DVT despite  Coumadin for afib (INR subtherapeutic). ?CVA/TIA, seizure. Seen by neurology.  No evidence of acute CVA on MRI brain.  EEG did not show seizure activity.  11/25: Patient was seen and examined at her bedside this morning.  No acute events overnight.  She has no new complaints.  She is alert and pleasant in a state of dementia.  05/19/19: Patient was seen and examined at her bedside this morning.  No acute events overnight.  She has no new complaints.  She denies chest pain, cough, dyspnea, abdominal pain, nausea, lower extremity pain.  She does not appear septic.  Leukocytosis noted this morning with WBC increased to 16,000.  Procalcitonin and lactic acid negative.  Repeating CBC today to rule out lab error.  Afebrile with no sign of active infective process.  Repeat COVID-19 done on 05/18/2019 was negative.  05/20/19: patient was seen and examined at her bedside. No acute events overnight. She has no new complaints. Electrolyte imbalance needing replacement.   Hospital Course:  Principal Problem:   AMS (altered mental status) Active Problems:   COPD (chronic obstructive pulmonary disease) (HCC)   Chronic atrial fibrillation (HCC)   Essential hypertension   Hyperlipidemia   Vascular dementia without behavioral disturbance (HCC)   Chronic congestive heart failure (HCC)   Left leg DVT (HCC)   Stage 3b chronic kidney disease  Resolved acute metabolic encephalopathy in the context of dementia/CVA work-up, ruled out/seizure work-up, EEG negative with no seizure-like movement while inpatient. Presented from home where she was slumped over and was incontinent of bowel and bladder Differential diagnosis includes seizure versus TIA/stroke versus syncope versus transient  alteration of consciousness in the context of advanced dementia. MRI brain without contrast MRI neck without contrast unremarkable for any acute findings.  No sign of acute CVA.  No large vessel stenosis or occlusion. Presented  with subtherapeutic INR, 1.2 in the setting of chronic A. fib on Coumadin with medication noncompliance. 2D echo done on 05/14/2019 >>LVEF 40-45% with diffused global hypokinesis EEG no evidence of seizure activity.  Mild generalized slowing. Seen by neurology.  Hypokalemia K+ 3.2, mg2+ 2.1 repleted  AKI on ckd2 Baseline cr 0.9 GFR 59 Cr 1.20 Monitor UO Avoid nephrotoxins  Newly diagnosed acute left lower extremity DVT Coumadin held, was on hep drip>> until 05/16/19 Switched to eliquis on 05/16/19, continue   Leukocytosis, suspect reactive.   White count jumped this morning to 16,000 No sign of infectious process Urine culture done on 05/14/2019 suggested recollection Does not appear septic She has no complaints Procalcitonin lactic acid on 05/19/2019 - Repeat CBC>> wbc trending down without abx. Does not appear septic and has no new complaints.  Resolved uncontrolled hypertension, complicated by A. fib with slow ventricular response Decreased dose of Lopressor 12.5 mg twice daily Continue hydralazine, increase dose to 100 mg 3 times daily Restarted losartan 50 mg daily. Restarted p.o. Lasix 40 mg daily. Continue Kcl- po 20 meq daily while on diuretics. Follow-up with primary care provider  Chronic A. fib on Coumadin Presented with INR 1.2 Goal INR between 2 and 3: Was on Coumadin Coumadin was held Switched to Eliquis on 05/16/2019 Follow-up with your cardiologist  Chronic systolic CHF Euvolemic on exam 2D echo done during this admission showed LVEF 40 to 45% Continue strict I's and O's and daily weight Continue current cardiac medications Follow-up with your cardiologist outpatient  Isolated elevated bilirubin Alkaline phosphatase,  AST ALT normal T bili 1.6 from 1.7 Follow up with your PCP  Dementia Reorient as needed Continue fall precautions  Hyperlipidemia LDL 85 on 05/16/2019 Continue Lipitor 20 mg daily  Hypothyroidism Continue levothyroxine  12.5 mg daily  Physical debility PT OT assessment recommended SNF CSW assisting with SNF placement Continue PT OT with assistance and fall precautions    Code Status:83- confirmed with family   Consults called:Neurology; PT/OT/ST/SW/Nutrition   Discharge Exam: BP 129/63 (BP Location: Left Arm)    Pulse 76    Temp 97.7 F (36.5 C) (Oral)    Resp 18    Ht  (1.676 m)    Wt 77.1 kg    SpO2 97%    BMI 27.44 kg/m   General: 83 y.o. year-old female well-developed well-nourished no acute distress.  Alert and pleasant in the state of dementia.  Cardiovascular: Irregular rate and rhythm no rubs or gallops no JVD or thyromegaly noted.    Respiratory: Clear to station no wheezes or rales.    Abdomen: Soft nontender nondistended with bowel sounds.  Musculoskeletal: Trace edema in lower extremities bilaterally.  Psychiatry: Mood is appropriate for condition and setting.  Discharge Instructions You were cared for by a hospitalist during your hospital stay. If you have any questions about your discharge medications or the care you received while you were in the hospital after you are discharged, you can call the unit and asked to speak with the hospitalist on call if the hospitalist that took care of you is not available. Once you are discharged, your primary care physician will handle any further medical issues. Please note that NO REFILLS for any discharge medications will be authorized once you are discharged,  as it is imperative that you return to your primary care physician (or establish a relationship with a primary care physician if you do not have one) for your aftercare needs so that they can reassess your need for medications and monitor your lab values.   Allergies as of 05/20/2019      Reactions   Codeine    REACTION: vomiting      Medication List    STOP taking these medications   warfarin 5 MG tablet Commonly known as: COUMADIN     TAKE these medications     acetaminophen 500 MG tablet Commonly known as: TYLENOL Take 1,000 mg by mouth every 6 (six) hours as needed for headache (pain).   AeroChamber Plus inhaler Use as instructed   albuterol 108 (90 Base) MCG/ACT inhaler Commonly known as: VENTOLIN HFA Inhale 2 puffs into the lungs every 6 (six) hours as needed for wheezing or shortness of breath.   apixaban 5 MG Tabs tablet Commonly known as: Eliquis Take 2 tablets ( ) twice daily for 7 days, then 1 tablet ( ) twice daily   atorvastatin 20 MG tablet Commonly known as: LIPITOR Take 1 tablet (20 mg total) by mouth daily. What changed: when to take this   feeding supplement (ENSURE ENLIVE) Liqd Take 237 mLs by mouth 3 (three) times daily between meals for 7 days.   furosemide 40 MG tablet Commonly known as: LASIX Take 1 tablet (40 mg total) by mouth daily. What changed: when to take this   hydrALAZINE 100 MG tablet Commonly known as: APRESOLINE Take 1 tablet (100 mg total) by mouth every 8 (eight) hours.   levothyroxine 25 MCG tablet Commonly known as: SYNTHROID Take 0.5 tablets (12.5 mcg total) by mouth daily before breakfast.   losartan 100 MG tablet Commonly known as: COZAAR Take 1 tablet (100 mg total) by mouth daily.   metoprolol tartrate 25 MG tablet Commonly known as: LOPRESSOR Take 0.5 tablets (12.5 mg total) by mouth 2 (two) times daily.   Namzaric 28-10 MG Cp24 Generic drug: Memantine HCl-Donepezil HCl Take 1 capsule by mouth at bedtime. What changed: when to take this   potassium chloride SA 20 MEQ tablet Commonly known as: KLOR-CON Take 1 tablet (20 mEq total) by mouth daily. What changed: when to take this      Allergies  Allergen Reactions   Codeine     REACTION: vomiting    Contact information for follow-up providers    [name], FNP. Call in 1 day(s).   Specialty: Family Medicine Why: Please call for a post hospital follow-up appointment. Contact information: [address]  [place] [address] [phone]        Jonelle Sidle, MD Follow up.   Specialty: Cardiology Why: Please call for a post hospital follow-up appointment. Contact information: [address] [place] [address] [phone]            Contact information for after-discharge care    Destination    [facility] Preferred SNF .   Service: Skilled Nursing Contact information: [address] [place] [address] [phone]                   The results of significant diagnostics from this hospitalization (including imaging, microbiology, ancillary and laboratory) are listed below for reference.    Significant Diagnostic Studies: Dg Chest 2 View  Result Date: 05/14/2019 CLINICAL DATA:  Syncope EXAM: CHEST - 2 VIEW COMPARISON:  04/07/2018  FINDINGS: Stable cardiomediastinal contours. Calcific aortic knob. Unchanged appearance of patchy interstitial opacity the medial aspect of the right lower lobe and peripheral aspect of the left lower lobe, likely chronic scarring or atelectasis. No new focal airspace consolidation no pleural effusion. No pneumothorax. No acute osseous findings. IMPRESSION: 1. No active cardiopulmonary disease. 2. Stable appearance of patchy interstitial opacity in the right lower lobe and peripheral aspect of the left lower lobe, likely scarring versus atelectasis. Electronically Signed   By: Duanne GuessNicholas  Plundo M.D.   On: 05/14/2019 13:37   Ct Head Wo Contrast  Result Date: 05/14/2019 CLINICAL DATA:  Altered level of consciousness. Bowel incontinence. EXAM: CT HEAD WITHOUT CONTRAST TECHNIQUE: Contiguous axial images were obtained from the base of the skull through the vertex without intravenous contrast. COMPARISON:  11/21/2013 head CT. FINDINGS: Brain: Chronic right frontal and right occipital encephalomalacia. No evidence of parenchymal hemorrhage or extra-axial fluid collection. No mass lesion, mass effect, or midline shift.  No CT evidence of acute infarction. Nonspecific moderate subcortical and periventricular white matter hypodensity, most in keeping with chronic small vessel ischemic change. Generalized cerebral volume loss. Cerebral ventricle sizes are concordant with the degree of cerebral volume loss. Vascular: No acute abnormality. Skull: No evidence of calvarial fracture. Sinuses/Orbits: The visualized paranasal sinuses are essentially clear. Other:  The mastoid air cells are unopacified. IMPRESSION: 1. No evidence of acute intracranial abnormality. 2. Chronic right frontal and right occipital encephalomalacia. 3. Generalized cerebral volume loss and moderate chronic small vessel ischemic changes in the cerebral white matter. Electronically Signed   By: Delbert PhenixJason A Poff M.D.   On: 05/14/2019 13:30   Mr Angio Neck Wo Contrast  Result Date: 05/15/2019 CLINICAL DATA:  83 year old female with dementia and atrial fibrillation presenting with altered mental status. EXAM: MRI HEAD WITHOUT CONTRAST MRA NECK WITHOUT CONTRAST TECHNIQUE: Multiplanar, multiecho pulse sequences of the brain and surrounding structures were obtained without intravenous contrast. Angiographic images of the neck were obtained using MRA technique with and without intravenous contrast. Carotid stenosis measurements (when applicable) are obtained utilizing NASCET criteria, using the distal internal carotid diameter as the denominator. CONTRAST:  None. COMPARISON:  CT head 05/14/2019. Remote MR head 12/03/2005 FINDINGS: MRI HEAD FINDINGS Patient was uncooperative. Some series are degraded by motion or could not can be completed. Brain: No evidence for acute infarction, hemorrhage, mass lesion, hydrocephalus, or extra-axial fluid. Generalized atrophy. Remote RIGHT frontal infarct/hematoma, with chronic blood products layering the encephalomalacia cavity. Also remote RIGHT occipital infarct is noted. Both these abnormalities have been present since 2007. T2 and  FLAIR hyperintensities in the white matter favored to represent chronic microvascular ischemic change. Vascular: Flow voids are maintained. Skull and upper cervical spine: Normal marrow signal. Mild pannus. Sinuses/Orbits: No acute findings. Other: None. MRA NECK FINDINGS Noncontrast examination was performed. Conventional branching of the great vessels from the arch without visible proximal stenosis. Carotid bifurcations demonstrate no flow reducing lesion. Posterior wall plaque at the LEFT carotid bifurcation is suggested, without findings to suggest greater than 50% stenosis. BILATERAL vertebral arteries are patent, RIGHT dominant. IMPRESSION: 1. No acute intracranial findings. Atrophy and small vessel disease. Remote ischemic changes as described. 2. No extracranial or intracranial flow reducing lesion is evident. Electronically Signed   By: Elsie StainJohn T Curnes M.D.   On: 05/15/2019 13:58   Mr Brain Wo Contrast  Result Date: 05/15/2019 CLINICAL DATA:  83 year old female with dementia and atrial fibrillation presenting with altered mental status. EXAM: MRI HEAD WITHOUT CONTRAST MRA NECK WITHOUT CONTRAST TECHNIQUE:  Multiplanar, multiecho pulse sequences of the brain and surrounding structures were obtained without intravenous contrast. Angiographic images of the neck were obtained using MRA technique with and without intravenous contrast. Carotid stenosis measurements (when applicable) are obtained utilizing NASCET criteria, using the distal internal carotid diameter as the denominator. CONTRAST:  None. COMPARISON:  CT head 05/14/2019. Remote MR head 12/03/2005 FINDINGS: MRI HEAD FINDINGS Patient was uncooperative. Some series are degraded by motion or could not can be completed. Brain: No evidence for acute infarction, hemorrhage, mass lesion, hydrocephalus, or extra-axial fluid. Generalized atrophy. Remote RIGHT frontal infarct/hematoma, with chronic blood products layering the encephalomalacia cavity. Also  remote RIGHT occipital infarct is noted. Both these abnormalities have been present since 2007. T2 and FLAIR hyperintensities in the white matter favored to represent chronic microvascular ischemic change. Vascular: Flow voids are maintained. Skull and upper cervical spine: Normal marrow signal. Mild pannus. Sinuses/Orbits: No acute findings. Other: None. MRA NECK FINDINGS Noncontrast examination was performed. Conventional branching of the great vessels from the arch without visible proximal stenosis. Carotid bifurcations demonstrate no flow reducing lesion. Posterior wall plaque at the LEFT carotid bifurcation is suggested, without findings to suggest greater than 50% stenosis. BILATERAL vertebral arteries are patent, RIGHT dominant. IMPRESSION: 1. No acute intracranial findings. Atrophy and small vessel disease. Remote ischemic changes as described. 2. No extracranial or intracranial flow reducing lesion is evident. Electronically Signed   By: Elsie StainJohn T Curnes M.D.   On: 05/15/2019 13:58   Vas Koreas Lower Extremity Venous (dvt) (only Mc & Wl 7a-7p)  Result Date: 05/15/2019  Lower Venous Study Indications: Pain, Swelling, and syncope.  Comparison Study: No prior Performing Technologist: Sherren Kernsandace Kanady RVS  Examination Guidelines: A complete evaluation includes B-mode imaging, spectral Doppler, color Doppler, and power Doppler as needed of all accessible portions of each vessel. Bilateral testing is considered an integral part of a complete examination. Limited examinations for reoccurring indications may be performed as noted.  +-----+---------------+---------+-----------+----------+--------------+  RIGHT Compressibility Phasicity Spontaneity Properties Thrombus Aging  +-----+---------------+---------+-----------+----------+--------------+  CFV   Full            Yes       Yes                                    +-----+---------------+---------+-----------+----------+--------------+    +---------+---------------+---------+-----------+----------+--------------+  LEFT      Compressibility Phasicity Spontaneity Properties Thrombus Aging  +---------+---------------+---------+-----------+----------+--------------+  CFV       Full            Yes       Yes                                    +---------+---------------+---------+-----------+----------+--------------+  SFJ       Full                                                             +---------+---------------+---------+-----------+----------+--------------+  FV Prox   Full                                                             +---------+---------------+---------+-----------+----------+--------------+  FV Mid    Full                                                             +---------+---------------+---------+-----------+----------+--------------+  FV Distal Full                                                             +---------+---------------+---------+-----------+----------+--------------+  PFV       Full                                                             +---------+---------------+---------+-----------+----------+--------------+  POP       Full            Yes       Yes                                    +---------+---------------+---------+-----------+----------+--------------+  PTV       None                                             Acute           +---------+---------------+---------+-----------+----------+--------------+  PERO      None                                             Acute           +---------+---------------+---------+-----------+----------+--------------+     Summary: Right: No evidence of common femoral vein obstruction. Left: Findings consistent with acute deep vein thrombosis involving the left posterior tibial veins, and left peroneal veins.  *See table(s) above for measurements and observations. Electronically signed by Servando Snare MD on 05/15/2019 at 10:58:36 AM.    Final      Microbiology: Recent Results (from the past 240 hour(s))  Urine culture     Status: Abnormal   Collection Time: 05/14/19 12:58 PM   Specimen: Urine, Clean Catch  Result Value Ref Range Status   Specimen Description URINE, CLEAN CATCH  Final   Special Requests   Final    NONE Performed at [facility], [address], [place], [place] [address]    Culture MULTIPLE SPECIES PRESENT, SUGGEST RECOLLECTION (A)  Final   Report Status 05/15/2019 FINAL  Final  SARS CORONAVIRUS 2 (TAT 6-24 HRS) Nasopharyngeal Nasopharyngeal Swab     Status: None   Collection Time: 05/14/19  3:35 PM   Specimen: Nasopharyngeal Swab  Result Value Ref Range Status   SARS Coronavirus 2 NEGATIVE NEGATIVE Final    Comment: (NOTE) SARS-CoV-2 target nucleic acids are NOT DETECTED. The SARS-CoV-2  RNA is generally detectable in upper and lower respiratory specimens during the acute phase of infection. Negative results do not preclude SARS-CoV-2 infection, do not rule out co-infections with other pathogens, and should not be used as the sole basis for treatment or other patient management decisions. Negative results must be combined with clinical observations, patient history, and epidemiological information. The expected result is Negative. Fact Sheet for Patients: HairSlick.no Fact Sheet for Healthcare Providers: quierodirigir.com This test is not yet approved or cleared by the [place] FDA and  has been authorized for detection and/or diagnosis of SARS-CoV-2 by FDA under an Emergency Use Authorization (EUA). This EUA will remain  in effect (meaning this test can be used) for the duration of the COVID-19 declaration under Section 56 4(b)(1) of the Act, 21 U.S.C. section 360bbb-3(b)(1), unless the authorization is terminated or revoked sooner. Performed at [facility], [address] [place], [place] [address]   SARS CORONAVIRUS 2  (TAT 6-24 HRS) Nasopharyngeal Nasopharyngeal Swab     Status: None   Collection Time: 05/18/19  8:53 AM   Specimen: Nasopharyngeal Swab  Result Value Ref Range Status   SARS Coronavirus 2 NEGATIVE NEGATIVE Final    Comment: (NOTE) SARS-CoV-2 target nucleic acids are NOT DETECTED. The SARS-CoV-2 RNA is generally detectable in upper and lower respiratory specimens during the acute phase of infection. Negative results do not preclude SARS-CoV-2 infection, do not rule out co-infections with other pathogens, and should not be used as the sole basis for treatment or other patient management decisions. Negative results must be combined with clinical observations, patient history, and epidemiological information. The expected result is Negative. Fact Sheet for Patients: HairSlick.no Fact Sheet for Healthcare Providers: quierodirigir.com This test is not yet approved or cleared by the [place] FDA and  has been authorized for detection and/or diagnosis of SARS-CoV-2 by FDA under an Emergency Use Authorization (EUA). This EUA will remain  in effect (meaning this test can be used) for the duration of the COVID-19 declaration under Section 56 4(b)(1) of the Act, 21 U.S.C. section 360bbb-3(b)(1), unless the authorization is terminated or revoked sooner. Performed at [facility], [address] [place], [place] [address]      Labs: Basic Metabolic Panel: Recent Labs  Lab 05/16/19 0411 05/17/19 0305 05/18/19 0233 05/19/19 0323 05/20/19 0726  NA 141 140 142 143 145  K 3.1* 4.0 3.9 3.5 3.2*  CL 107 108 108 109 109  CO2 24 23 22 23 24   GLUCOSE 97 94 106* 116* 123*  BUN 14 12 19  29* 33*  CREATININE 0.91 0.92 1.11* 1.23* 1.20*  CALCIUM 8.8* 9.3 9.2 9.2 9.1  MG  --   --   --   --  2.1   Liver Function Tests: Recent Labs  Lab 05/14/19 1230 05/16/19 0411 05/20/19 0726  AST 33 25 22  ALT 25 20 20   ALKPHOS 93 74 69   BILITOT 1.7* 1.6* 0.9  PROT 6.9 6.0* 5.8*  ALBUMIN 4.0 3.2* 3.2*   No results for input(s): LIPASE, AMYLASE in the last 168 hours. No results for input(s): AMMONIA in the last 168 hours. CBC: Recent Labs  Lab 05/18/19 0233 05/19/19 0323 05/19/19 0544 05/19/19 1646 05/20/19 0726  WBC 9.7 16.9* DUPL SEE [id number] 16.2* 12.0*  NEUTROABS  --  13.6* PENDING  --  9.1*  HGB 15.4* 15.0 DUPL SEE [id number] 15.6* 14.3  HCT 45.5 45.3 DUPL SEE [id number] 46.5* 42.8  MCV 92.5 94.0 DUPL  SEE [id number] 94.1 93.9  PLT 262 261 DUPL SEE [id number] 249 249   Cardiac Enzymes: No results for input(s): CKTOTAL, CKMB, CKMBINDEX, TROPONINI in the last 168 hours. BNP: BNP (last 3 results) No results for input(s): BNP in the last 8760 hours.  ProBNP (last 3 results) No results for input(s): PROBNP in the last 8760 hours.  CBG: Recent Labs  Lab 05/14/19 1251  GLUCAP 130*       Signed:  Darlin Drop, MD [facility] 05/20/2019, 2:38 PM

## 2019-05-20 NOTE — Clinical Social Work Note (Signed)
Patient will discharge to [facility] once [facility] received. Call made to Cleon Dew, admissions director of [facility] and message left. COVID test resulted negative on 11/25. Clinical social work will continue to follow and facilitate discharge once auth received.  Juna Caban Givens, MSW, LCSW Licensed Clinical Social Worker [place] [phone]

## 2019-05-20 NOTE — Progress Notes (Signed)
Physical Therapy Treatment Patient Details Name: Wanda Mckenzie MRN: [id number] DOB: Aug 31, 1935 Today's Date: 05/20/2019    History of Present Illness  Wanda Mckenzie is a 83 y.o. female with medical history significant of CVA; HTN; HLD; COPD; dementia; and afib presenting with AMS. Admitted for TIA CVA work up/seizure rule out evaluation. CT negative for acute abnormality; pending MRI. On heparin drip for acute left lower extremity DVT.     PT Comments    Pt assisted with performing stand and pivoting to recliner.  Pt with improved ability to assist once she is able to understand task and initiates movement.  Continue to recommend SNF upon d/c.   Follow Up Recommendations  SNF;Supervision/Assistance - 24 hour     Equipment Recommendations  None recommended by PT    Recommendations for Other Services       Precautions / Restrictions Precautions Precautions: Fall    Mobility  Bed Mobility Overal bed mobility: Needs Assistance Bed Mobility: Supine to Sit     Supine to sit: Max assist     General bed mobility comments: max assist for initiation and completion of movement  Transfers Overall transfer level: Needs assistance Equipment used: Rolling walker (2 wheeled) Transfers: Sit to/from [facility] Sit to Stand: Min assist Stand pivot transfers: Min assist       General transfer comment: upon first attempt max assist however once pt provided with multimodal cues and understood task, min assist for steadying  Ambulation/Gait             General Gait Details: (would prefer +2 for safety due to cognition)   Stairs             Wheelchair Mobility    Modified Rankin (Stroke Patients Only)       Balance                                            Cognition Arousal/Alertness: Awake/alert Behavior During Therapy: WFL for tasks assessed/performed Overall Cognitive Status: History of cognitive impairments - at  baseline                                 General Comments: History of dementia; pt pleasantly confused, follows simple commands with multimodal cues and increased time, oriented to self only, not age or birthday.      Exercises      General Comments        Pertinent Vitals/Pain Pain Assessment: No/denies pain Pain Intervention(s): Monitored during session;Repositioned    Home Living                      Prior Function            PT Goals (current goals can now be found in the care plan section) Progress towards PT goals: Progressing toward goals    Frequency    Min 2X/week      PT Plan Current plan remains appropriate    Co-evaluation              AM-PAC PT "6 Clicks" Mobility   Outcome Measure  Help needed turning from your back to your side while in a flat bed without using bedrails?: Total Help needed moving from lying on your back to sitting on the side of  a flat bed without using bedrails?: Total Help needed moving to and from a bed to a chair (including a wheelchair)?: A Lot Help needed standing up from a chair using your arms (e.g., wheelchair or bedside chair)?: A Lot Help needed to walk in hospital room?: A Lot Help needed climbing 3-5 steps with a railing? : Total 6 Click Score: 9    End of Session Equipment Utilized During Treatment: Gait belt Activity Tolerance: Patient tolerated treatment well Patient left: in chair;with call bell/phone within reach;with chair alarm set;with nursing/sitter in room Nurse Communication: Mobility status PT Visit Diagnosis: Muscle weakness (generalized) (M62.81);Other abnormalities of gait and mobility (R26.89)     Time: 1914-7829 PT Time Calculation (min) (ACUTE ONLY): 14 min  Charges:  $Therapeutic Activity: 8-22 mins                     Zenovia Jarred, PT, DPT Acute Rehabilitation Services Office: [phone] Pager: [phone]  Sarajane Jews 05/20/2019, 3:53 PM

## 2019-05-21 LAB — BASIC METABOLIC PANEL
Anion gap: 12 (ref 5–15)
BUN: 30 mg/dL — ABNORMAL HIGH (ref 8–23)
CO2: 23 mmol/L (ref 22–32)
Calcium: 9.1 mg/dL (ref 8.9–10.3)
Chloride: 108 mmol/L (ref 98–111)
Creatinine, Ser: 1.22 mg/dL — ABNORMAL HIGH (ref 0.44–1.00)
GFR calc Af Amer: 48 mL/min — ABNORMAL LOW (ref >60)
GFR calc non Af Amer: 42 mL/min — ABNORMAL LOW (ref >60)
Glucose, Bld: 128 mg/dL — ABNORMAL HIGH (ref 70–99)
Potassium: 3.8 mmol/L (ref 3.5–5.1)
Sodium: 143 mmol/L (ref 135–145)

## 2019-05-21 LAB — CBC
HCT: 45.1 % (ref 36.0–46.0)
Hemoglobin: 14.8 g/dL (ref 12.0–15.0)
MCH: 31.4 pg (ref 26.0–34.0)
MCHC: 32.8 g/dL (ref 30.0–36.0)
MCV: 95.6 fL (ref 80.0–100.0)
Platelets: 255 10*3/uL (ref 150–400)
RBC: 4.72 MIL/uL (ref 3.87–5.11)
RDW: 13.2 % (ref 11.5–15.5)
WBC: 12.2 10*3/uL — ABNORMAL HIGH (ref 4.0–10.5)
nRBC: 0 % (ref 0.0–0.2)

## 2019-05-21 NOTE — Progress Notes (Signed)
Discharge Summary  Coral CeoMyrtle J Poppen ZOX:[id number] DOB: 10-06-1935  PCP: Gwenlyn FudgeJoyce, Britney F, FNP  Admit date: 05/14/2019 Discharge date: 05/21/2019  Time spent: 35 minutes  Recommendations for Outpatient Follow-up:  1. Follow-up with your PCP 2. Follow-up with your cardiologist 3. Take your medications as prescribed 4. Continue physical therapy 5. Fall precautions  Discharge Diagnoses:  Active Hospital Problems   Diagnosis Date Noted   AMS (altered mental status) 05/14/2019   Left leg DVT (HCC) 05/14/2019   Stage 3b chronic kidney disease 05/14/2019   Chronic congestive heart failure (HCC) 01/10/2019   Hyperlipidemia 06/19/2016   Vascular dementia without behavioral disturbance (HCC) 06/08/2014   Essential hypertension    Chronic atrial fibrillation (HCC)    COPD (chronic obstructive pulmonary disease) (HCC)     Resolved Hospital Problems  No resolved problems to display.    Discharge Condition: Stable  Diet recommendation: Resume previous diet  Vitals:   05/21/19 0757 05/21/19 1117  BP: 134/71 134/70  Pulse: 71 78  Resp: 18 18  Temp: 97.9 F (36.6 C) 98.1 F (36.7 C)  SpO2: 97% 99%    History of present illness:  WJX:BJYNWGHPI:Wanda J Nelsonis a 83 y.o.femalewith medical history significant ofCVA; HTN; HLD; COPD; dementia; and afib presenting with AMS.Patient has advanced dementia and is unable to answer questions. I called and discussed the patient's husband. He reports that a caregiver was there when episode happened. Sat and ate a few bites, looked like she was walking to fall out of the chair and so they laid her in the floor and called 911. She did not lose consciousness. She has been refusing her medications some, would not eat or take meds the last few days.    ED Course:Dementia, presenting with AMS, ?history. Slumped over, lost control of bowel/bladder. Back to baseline upon EMS arrival. ?LLE weakness. LLE pain, +DVT despite Coumadin  for afib (INR subtherapeutic). ?CVA/TIA, seizure. Seen by neurology.  No evidence of acute CVA on MRI brain.  EEG did not show seizure activity.  11/25: Patient was seen and examined at her bedside this morning.  No acute events overnight.  She has no new complaints.  She is alert and pleasant in a state of dementia.  05/19/19: Patient was seen and examined at her bedside this morning.  No acute events overnight.  She has no new complaints.  She denies chest pain, cough, dyspnea, abdominal pain, nausea, lower extremity pain.  She does not appear septic.  Leukocytosis noted this morning with WBC increased to 16,000.  Procalcitonin and lactic acid negative.  Repeating CBC today to rule out lab error.  Afebrile with no sign of active infective process.  Repeat COVID-19 done on 05/18/2019 was negative.  05/20/19: patient was seen and examined at her bedside. No acute events overnight. She has no new complaints. Electrolyte imbalance needing replacement.  05/21/19: Seen and examined at her bedside.  She has no new complaints.  She has been assessed by PT OT recommendation for SNF.  CSW assisting with SNF placement.   Hospital Course:  Principal Problem:   AMS (altered mental status) Active Problems:   COPD (chronic obstructive pulmonary disease) (HCC)   Chronic atrial fibrillation (HCC)   Essential hypertension   Hyperlipidemia   Vascular dementia without behavioral disturbance (HCC)   Chronic congestive heart failure (HCC)   Left leg DVT (HCC)   Stage 3b chronic kidney disease  Resolved acute metabolic encephalopathy in the context of dementia/CVA work-up, ruled out/seizure work-up, EEG negative with  no seizure-like movement while inpatient. Presented from home where she was slumped over and was incontinent of bowel and bladder Differential diagnosis includes seizure versus TIA/stroke versus syncope versus transient alteration of consciousness in the context of advanced dementia. MRI brain  without contrast MRI neck without contrast unremarkable for any acute findings.  No sign of acute CVA.  No large vessel stenosis or occlusion. Presented with subtherapeutic INR, 1.2 in the setting of chronic A. fib on Coumadin with medication noncompliance. 2D echo done on 05/14/2019 >>LVEF 40-45% with diffused global hypokinesis EEG no evidence of seizure activity.  Mild generalized slowing. Seen by neurology.  Resolved post repletion: Hypokalemia K+ 3.2, mg2+ 2.1>> 3.8 on 05/21/2019.  AKI on ckd2 suspect prerenal secondary to intravascular volume depletion Baseline cr 0.9 GFR 59 Cr 1.20>> 1.22 on 05/21/2019 Judicious with IV fluid hydration due to chronic systolic CHF with EF 40 to 45% Continue to monitor UO Continue to avoid nephrotoxins  Newly diagnosed acute left lower extremity DVT Coumadin held, was on hep drip>> until 05/16/19 Switched to eliquis on 05/16/19, continue   Leukocytosis, suspect reactive.   White count jumped this morning to 16,000 No sign of infectious process Urine culture done on 05/14/2019 suggested recollection Does not appear septic She has no complaints Procalcitonin lactic acid on 05/19/2019 - Repeat CBC>> wbc trending down without abx. Does not appear septic and has no new complaints.  Resolved uncontrolled hypertension, complicated by A. fib with slow ventricular response Decreased dose of Lopressor 12.5 mg twice daily Continue hydralazine, increase dose to 100 mg 3 times daily Restarted losartan 50 mg daily. Restarted p.o. Lasix 40 mg daily. Continue Kcl- po 20 meq daily while on diuretics. Follow-up with primary care provider  Chronic A. fib on Coumadin Presented with INR 1.2 Goal INR between 2 and 3: Was on Coumadin Coumadin was held Switched to Eliquis on 05/16/2019 Follow-up with your cardiologist  Chronic systolic CHF Euvolemic on exam 2D echo done during this admission showed LVEF 40 to 45% Continue strict I's and O's and daily  weight Continue current cardiac medications Follow-up with your cardiologist outpatient  Isolated elevated bilirubin Alkaline phosphatase,  AST ALT normal T bili 1.6 from 1.7 Follow up with your PCP  Dementia Reorient as needed Continue fall precautions  Hyperlipidemia LDL 85 on 05/16/2019 Continue Lipitor 20 mg daily  Hypothyroidism Continue levothyroxine 12.5 mg daily  Physical debility PT OT assessment recommended SNF CSW assisting with SNF placement Continue PT OT with assistance and fall precautions    Code Status:DNR- confirmed with family   Consults called:Neurology; PT/OT/ST/SW/Nutrition   Discharge Exam: BP 134/70 (BP Location: Left Arm)    Pulse 78    Temp 98.1 F (36.7 C) (Oral)    Resp 18    Ht 5\' 6"  (1.676 m)    Wt 77.1 kg    SpO2 99%    BMI 27.44 kg/m   General: 83 y.o. year-old female pleasant well-developed well-nourished no acute distress.  Alert and pleasant in the state of dementia.    Cardiovascular: Irregular rate and rhythm no rubs or gallops.  No JVD or thyromegaly noted.    Respiratory: Clear to auscultation no wheezes or rales.  Abdomen: Soft normal bowel sounds present.  Nontender nondistended.      Musculoskeletal: Trace edema in lower extremities bilaterally.  Psychiatry: Mood is appropriate for condition and setting.  Discharge Instructions You were cared for by a hospitalist during your hospital stay. If you have any questions about your  discharge medications or the care you received while you were in the hospital after you are discharged, you can call the unit and asked to speak with the hospitalist on call if the hospitalist that took care of you is not available. Once you are discharged, your primary care physician will handle any further medical issues. Please note that NO REFILLS for any discharge medications will be authorized once you are discharged, as it is imperative that you return to your primary care physician  (or establish a relationship with a primary care physician if you do not have one) for your aftercare needs so that they can reassess your need for medications and monitor your lab values.   Allergies as of 05/21/2019      Reactions   Codeine    REACTION: vomiting      Medication List    STOP taking these medications   warfarin 5 MG tablet Commonly known as: COUMADIN     TAKE these medications   acetaminophen 500 MG tablet Commonly known as: TYLENOL Take 1,000 mg by mouth every 6 (six) hours as needed for headache (pain).   AeroChamber Plus inhaler Use as instructed   albuterol 108 (90 Base) MCG/ACT inhaler Commonly known as: VENTOLIN HFA Inhale 2 puffs into the lungs every 6 (six) hours as needed for wheezing or shortness of breath.   apixaban 5 MG Tabs tablet Commonly known as: Eliquis Take 2 tablets (10mg ) twice daily for 7 days, then 1 tablet (5mg ) twice daily   atorvastatin 20 MG tablet Commonly known as: LIPITOR Take 1 tablet (20 mg total) by mouth daily. What changed: when to take this   feeding supplement (ENSURE ENLIVE) Liqd Take 237 mLs by mouth 3 (three) times daily between meals for 7 days.   furosemide 40 MG tablet Commonly known as: LASIX Take 1 tablet (40 mg total) by mouth daily. What changed: when to take this   hydrALAZINE 100 MG tablet Commonly known as: APRESOLINE Take 1 tablet (100 mg total) by mouth every 8 (eight) hours.   levothyroxine 25 MCG tablet Commonly known as: SYNTHROID Take 0.5 tablets (12.5 mcg total) by mouth daily before breakfast.   losartan 100 MG tablet Commonly known as: COZAAR Take 1 tablet (100 mg total) by mouth daily.   metoprolol tartrate 25 MG tablet Commonly known as: LOPRESSOR Take 0.5 tablets (12.5 mg total) by mouth 2 (two) times daily.   Namzaric 28-10 MG Cp24 Generic drug: Memantine HCl-Donepezil HCl Take 1 capsule by mouth at bedtime. What changed: when to take this   potassium chloride SA 20 MEQ  tablet Commonly known as: KLOR-CON Take 1 tablet (20 mEq total) by mouth daily. What changed: when to take this      Allergies  Allergen Reactions   Codeine     REACTION: vomiting    Contact information for follow-up providers    Loman Brooklyn, FNP. Call in 1 day(s).   Specialty: Family Medicine Why: Please call for a post hospital follow-up appointment. Contact information: [place] [address] [phone]        Satira Sark, MD Follow up.   Specialty: Cardiology Why: Please call for a post hospital follow-up appointment. Contact information: [place] [address] [phone]            Contact information for after-discharge care    [facility] .   Service: Skilled Nursing Contact information: [address] [place] [address] [phone]                   The results of significant diagnostics from this hospitalization (including imaging, microbiology, ancillary and laboratory) are listed below for reference.    Significant Diagnostic Studies: Dg Chest 2 View  Result Date: 05/14/2019 CLINICAL DATA:  Syncope EXAM: CHEST - 2 VIEW COMPARISON:  04/07/2018 FINDINGS: Stable cardiomediastinal contours. Calcific aortic knob. Unchanged appearance of patchy interstitial opacity the medial aspect of the right lower lobe and peripheral aspect of the left lower lobe, likely chronic scarring or atelectasis. No new focal airspace consolidation no pleural effusion. No pneumothorax. No acute osseous findings. IMPRESSION: 1. No active cardiopulmonary disease. 2. Stable appearance of patchy interstitial opacity in the right lower lobe and peripheral aspect of the left lower lobe, likely scarring versus atelectasis. Electronically Signed   By: Duanne Guess M.D.   On: 05/14/2019 13:37   Ct Head Wo Contrast  Result Date: 05/14/2019 CLINICAL DATA:  Altered level of  consciousness. Bowel incontinence. EXAM: CT HEAD WITHOUT CONTRAST TECHNIQUE: Contiguous axial images were obtained from the base of the skull through the vertex without intravenous contrast. COMPARISON:  11/21/2013 head CT. FINDINGS: Brain: Chronic right frontal and right occipital encephalomalacia. No evidence of parenchymal hemorrhage or extra-axial fluid collection. No mass lesion, mass effect, or midline shift. No CT evidence of acute infarction. Nonspecific moderate subcortical and periventricular white matter hypodensity, most in keeping with chronic small vessel ischemic change. Generalized cerebral volume loss. Cerebral ventricle sizes are concordant with the degree of cerebral volume loss. Vascular: No acute abnormality. Skull: No evidence of calvarial fracture. Sinuses/Orbits: The visualized paranasal sinuses are essentially clear. Other:  The mastoid air cells are unopacified. IMPRESSION: 1. No evidence of acute intracranial abnormality. 2. Chronic right frontal and right occipital encephalomalacia. 3. Generalized cerebral volume loss and moderate chronic small vessel ischemic changes in the cerebral white matter. Electronically Signed   By: Delbert Phenix M.D.   On: 05/14/2019 13:30   Mr Angio Neck Wo Contrast  Result Date: 05/15/2019 CLINICAL DATA:  83 year old female with dementia and atrial fibrillation presenting with altered mental status. EXAM: MRI HEAD WITHOUT CONTRAST MRA NECK WITHOUT CONTRAST TECHNIQUE: Multiplanar, multiecho pulse sequences of the brain and surrounding structures were obtained without intravenous contrast. Angiographic images of the neck were obtained using MRA technique with and without intravenous contrast. Carotid stenosis measurements (when applicable) are obtained utilizing NASCET criteria, using the distal internal carotid diameter as the denominator. CONTRAST:  None. COMPARISON:  CT head 05/14/2019. Remote MR head 12/03/2005 FINDINGS: MRI HEAD FINDINGS Patient was  uncooperative. Some series are degraded by motion or could not can be completed. Brain: No evidence for acute infarction, hemorrhage, mass lesion, hydrocephalus, or extra-axial fluid. Generalized atrophy. Remote RIGHT frontal infarct/hematoma, with chronic blood products layering the encephalomalacia cavity. Also remote RIGHT occipital infarct is noted. Both these abnormalities have been present since 2007. T2 and FLAIR hyperintensities in the white matter favored to represent chronic microvascular ischemic change. Vascular: Flow voids are maintained. Skull and upper cervical spine: Normal marrow signal. Mild pannus. Sinuses/Orbits: No acute findings. Other: None. MRA NECK FINDINGS Noncontrast examination was performed. Conventional branching of the great vessels from the arch without visible proximal stenosis. Carotid bifurcations demonstrate no flow reducing lesion. Posterior wall plaque at the LEFT carotid bifurcation is suggested, without findings to suggest greater than 50% stenosis. BILATERAL vertebral arteries are patent, RIGHT dominant. IMPRESSION: 1. No acute  intracranial findings. Atrophy and small vessel disease. Remote ischemic changes as described. 2. No extracranial or intracranial flow reducing lesion is evident. Electronically Signed   By: Elsie Stain M.D.   On: 05/15/2019 13:58   Mr Brain Wo Contrast  Result Date: 05/15/2019 CLINICAL DATA:  83 year old female with dementia and atrial fibrillation presenting with altered mental status. EXAM: MRI HEAD WITHOUT CONTRAST MRA NECK WITHOUT CONTRAST TECHNIQUE: Multiplanar, multiecho pulse sequences of the brain and surrounding structures were obtained without intravenous contrast. Angiographic images of the neck were obtained using MRA technique with and without intravenous contrast. Carotid stenosis measurements (when applicable) are obtained utilizing NASCET criteria, using the distal internal carotid diameter as the denominator. CONTRAST:  None.  COMPARISON:  CT head 05/14/2019. Remote MR head 12/03/2005 FINDINGS: MRI HEAD FINDINGS Patient was uncooperative. Some series are degraded by motion or could not can be completed. Brain: No evidence for acute infarction, hemorrhage, mass lesion, hydrocephalus, or extra-axial fluid. Generalized atrophy. Remote RIGHT frontal infarct/hematoma, with chronic blood products layering the encephalomalacia cavity. Also remote RIGHT occipital infarct is noted. Both these abnormalities have been present since 2007. T2 and FLAIR hyperintensities in the white matter favored to represent chronic microvascular ischemic change. Vascular: Flow voids are maintained. Skull and upper cervical spine: Normal marrow signal. Mild pannus. Sinuses/Orbits: No acute findings. Other: None. MRA NECK FINDINGS Noncontrast examination was performed. Conventional branching of the great vessels from the arch without visible proximal stenosis. Carotid bifurcations demonstrate no flow reducing lesion. Posterior wall plaque at the LEFT carotid bifurcation is suggested, without findings to suggest greater than 50% stenosis. BILATERAL vertebral arteries are patent, RIGHT dominant. IMPRESSION: 1. No acute intracranial findings. Atrophy and small vessel disease. Remote ischemic changes as described. 2. No extracranial or intracranial flow reducing lesion is evident. Electronically Signed   By: Elsie Stain M.D.   On: 05/15/2019 13:58   Vas [place] Lower Extremity Venous (dvt) (only Mc & Wl 7a-7p)  Result Date: 05/15/2019  Lower Venous Study Indications: Pain, Swelling, and syncope.  Comparison Study: No prior Performing Technologist: Sherren Kerns RVS  Examination Guidelines: A complete evaluation includes B-mode imaging, spectral Doppler, color Doppler, and power Doppler as needed of all accessible portions of each vessel. Bilateral testing is considered an integral part of a complete examination. Limited examinations for reoccurring indications may be  performed as noted.  +-----+---------------+---------+-----------+----------+--------------+  RIGHT Compressibility Phasicity Spontaneity Properties Thrombus Aging  +-----+---------------+---------+-----------+----------+--------------+  CFV   Full            Yes       Yes                                    +-----+---------------+---------+-----------+----------+--------------+   +---------+---------------+---------+-----------+----------+--------------+  LEFT      Compressibility Phasicity Spontaneity Properties Thrombus Aging  +---------+---------------+---------+-----------+----------+--------------+  CFV       Full            Yes       Yes                                    +---------+---------------+---------+-----------+----------+--------------+  SFJ       Full                                                             +---------+---------------+---------+-----------+----------+--------------+  FV Prox   Full                                                             +---------+---------------+---------+-----------+----------+--------------+  FV Mid    Full                                                             +---------+---------------+---------+-----------+----------+--------------+  FV Distal Full                                                             +---------+---------------+---------+-----------+----------+--------------+  PFV       Full                                                             +---------+---------------+---------+-----------+----------+--------------+  POP       Full            Yes       Yes                                    +---------+---------------+---------+-----------+----------+--------------+  PTV       None                                             Acute           +---------+---------------+---------+-----------+----------+--------------+  PERO      None                                             Acute            +---------+---------------+---------+-----------+----------+--------------+     Summary: Right: No evidence of common femoral vein obstruction. Left: Findings consistent with acute deep vein thrombosis involving the left posterior tibial veins, and left peroneal veins.  *See table(s) above for measurements and observations. Electronically signed by Lemar Livings MD on 05/15/2019 at 10:58:36 AM.    Final     Microbiology: Recent Results (from the past 240 hour(s))  Urine culture     Status: Abnormal   Collection Time: 05/14/19 12:58 PM   Specimen: Urine, Clean Catch  Result Value Ref Range Status   Specimen Description URINE, CLEAN CATCH  Final   Special Requests   Final    NONE Performed at [facility], [address] [place], [place] [address]    Culture MULTIPLE SPECIES PRESENT, SUGGEST RECOLLECTION (A)  Final   Report Status 05/15/2019 FINAL  Final  SARS CORONAVIRUS 2 (TAT 6-24 HRS) Nasopharyngeal Nasopharyngeal Swab     Status: None   Collection Time: 05/14/19  3:35 PM   Specimen: Nasopharyngeal Swab  Result Value Ref Range Status   SARS Coronavirus 2 NEGATIVE NEGATIVE Final    Comment: (NOTE) SARS-CoV-2 target nucleic acids are NOT DETECTED. The SARS-CoV-2 RNA is generally detectable in upper and lower respiratory specimens during the acute phase of infection. Negative results do not preclude SARS-CoV-2 infection, do not rule out co-infections with other pathogens, and should not be used as the sole basis for treatment or other patient management decisions. Negative results must be combined with clinical observations, patient history, and epidemiological information. The expected result is Negative. Fact Sheet for Patients: HairSlick.no Fact Sheet for Healthcare Providers: quierodirigir.com This test is not yet approved or cleared by the [place] FDA and  has been authorized for detection and/or diagnosis of  SARS-CoV-2 by FDA under an Emergency Use Authorization (EUA). This EUA will remain  in effect (meaning this test can be used) for the duration of the COVID-19 declaration under Section 56 4(b)(1) of the Act, 21 U.S.C. section 360bbb-3(b)(1), unless the authorization is terminated or revoked sooner. Performed at [facility], [address] [place], [place] [address]   SARS CORONAVIRUS 2 (TAT 6-24 HRS) Nasopharyngeal Nasopharyngeal Swab     Status: None   Collection Time: 05/18/19  8:53 AM   Specimen: Nasopharyngeal Swab  Result Value Ref Range Status   SARS Coronavirus 2 NEGATIVE NEGATIVE Final    Comment: (NOTE) SARS-CoV-2 target nucleic acids are NOT DETECTED. The SARS-CoV-2 RNA is generally detectable in upper and lower respiratory specimens during the acute phase of infection. Negative results do not preclude SARS-CoV-2 infection, do not rule out co-infections with other pathogens, and should not be used as the sole basis for treatment or other patient management decisions. Negative results must be combined with clinical observations, patient history, and epidemiological information. The expected result is Negative. Fact Sheet for Patients: HairSlick.no Fact Sheet for Healthcare Providers: quierodirigir.com This test is not yet approved or cleared by the [place] FDA and  has been authorized for detection and/or diagnosis of SARS-CoV-2 by FDA under an Emergency Use Authorization (EUA). This EUA will remain  in effect (meaning this test can be used) for the duration of the COVID-19 declaration under Section 56 4(b)(1) of the Act, 21 U.S.C. section 360bbb-3(b)(1), unless the authorization is terminated or revoked sooner. Performed at [facility], [address], [place], [place] [address]      Labs: Basic Metabolic Panel: Recent Labs  Lab 05/17/19 0305 05/18/19 0233 05/19/19 0323 05/20/19 0726  05/21/19 0650  NA 140 142 143 145 143  K 4.0 3.9 3.5 3.2* 3.8  CL 108 108 109 109 108  CO2 GLUCOSE 94 106* 116* 123* 128*  BUN 12 19 29* 33* 30*  CREATININE 0.92 1.11* 1.23* 1.20* 1.22*  CALCIUM 9.3 9.2 9.2 9.1 9.1  MG  --   --   --  2.1  --    Liver Function Tests: Recent Labs  Lab 05/16/19 0411 05/20/19 0726  AST 25 22  ALT 20 20  ALKPHOS 74 69  BILITOT 1.6* 0.9  PROT 6.0* 5.8*  ALBUMIN 3.2* 3.2*   No results for input(s): LIPASE, AMYLASE in the last 168 hours. No results for input(s): AMMONIA in the last 168 hours.  CBC: Recent Labs  Lab 05/19/19 0323 05/19/19 0544 05/19/19 1646 05/20/19 0726 05/21/19 0650  WBC 16.9* DUPL SEE [id number] 16.2* 12.0* 12.2*  NEUTROABS 13.6* PENDING  --  9.1*  --   HGB 15.0 DUPL SEE [id number] 15.6* 14.3 14.8  HCT 45.3 DUPL SEE [id number] 46.5* 42.8 45.1  MCV 94.0 DUPL SEE [id number] 94.1 93.9 95.6  PLT 261 DUPL SEE [id number] 249 249 255   Cardiac Enzymes: No results for input(s): CKTOTAL, CKMB, CKMBINDEX, TROPONINI in the last 168 hours. BNP: BNP (last 3 results) No results for input(s): BNP in the last 8760 hours.  ProBNP (last 3 results) No results for input(s): PROBNP in the last 8760 hours.  CBG: No results for input(s): GLUCAP in the last 168 hours.     Signed:  Darlin Drop, MD [facility] 05/21/2019, 3:25 PM

## 2019-05-22 NOTE — Progress Notes (Signed)
Discharge Summary  [name]:[id number] DOB: 03/10/1936  PCP: [name], FNP  Admit date: 05/14/2019 Discharge date: 05/22/2019  Time spent: 35 minutes  Recommendations for Outpatient Follow-up:  1. Follow-up with your PCP 2. Follow-up with your cardiologist 3. Take your medications as prescribed 4. Continue physical therapy 5. Fall precautions  Discharge Diagnoses:  Active Hospital Problems   Diagnosis Date Noted   AMS (altered mental status) 05/14/2019   Left leg DVT (HCC) 05/14/2019   Stage 3b chronic kidney disease 05/14/2019   Chronic congestive heart failure (HCC) 01/10/2019   Hyperlipidemia 06/19/2016   Vascular dementia without behavioral disturbance (HCC) 06/08/2014   Essential hypertension    Chronic atrial fibrillation (HCC)    COPD (chronic obstructive pulmonary disease) (HCC)     Resolved Hospital Problems  No resolved problems to display.    Discharge Condition: Stable  Diet recommendation: Resume previous diet  Vitals:   05/22/19 0716 05/22/19 1239  BP: (!) 161/60 (!) 151/71  Pulse: 80 81  Resp: 18 18  Temp: 97.7 F (36.5 C) 98 F (36.7 C)  SpO2: 98% 97%    History of present illness:  Wanda Mckenzie:Wanda J Nelsonis a 83 y.o.femalewith medical history significant ofCVA; HTN; HLD; COPD; dementia; and afib presenting with AMS.Patient has advanced dementia and is unable to answer questions. I called and discussed the patient's husband. He reports that a caregiver was there when episode happened. Sat and ate a few bites, looked like she was walking to fall out of the chair and so they laid her in the floor and called 911. She did not lose consciousness. She has been refusing her medications some, would not eat or take meds the last few days.    ED Course:Dementia, presenting with AMS, ?history. Slumped over, lost control of bowel/bladder. Back to baseline upon EMS arrival. ?LLE weakness. LLE pain, +DVT despite  Coumadin for afib (INR subtherapeutic). ?CVA/TIA, seizure. Seen by neurology.  No evidence of acute CVA on MRI brain.  EEG did not show seizure activity.  11/25: Patient was seen and examined at her bedside this morning.  No acute events overnight.  She has no new complaints.  She is alert and pleasant in a state of dementia.  05/19/19: Patient was seen and examined at her bedside this morning.  No acute events overnight.  She has no new complaints.  She denies chest pain, cough, dyspnea, abdominal pain, nausea, lower extremity pain.  She does not appear septic.  Leukocytosis noted this morning with WBC increased to 16,000.  Procalcitonin and lactic acid negative.  Repeating CBC today to rule out lab error.  Afebrile with no sign of active infective process.  Repeat COVID-19 done on 05/18/2019 was negative.  05/22/19: Patient was seen and examined at bedside this morning.  No acute events overnight.  She has no new complaints.  She has been evaluated by PT OT with recommendation for SNF.  CSW has been consulted and assisting with SNF placement.   Hospital Course:  Principal Problem:   AMS (altered mental status) Active Problems:   COPD (chronic obstructive pulmonary disease) (HCC)   Chronic atrial fibrillation (HCC)   Essential hypertension   Hyperlipidemia   Vascular dementia without behavioral disturbance (HCC)   Chronic congestive heart failure (HCC)   Left leg DVT (HCC)   Stage 3b chronic kidney disease  Resolved acute metabolic encephalopathy in the context of dementia/CVA work-up, ruled out/seizure work-up, EEG negative with no seizure-like movement while inpatient. Presented from home  where she was slumped over and was incontinent of bowel and bladder Differential diagnosis includes seizure versus TIA/stroke versus syncope versus transient alteration of consciousness in the context of advanced dementia. MRI brain without contrast MRI neck without contrast unremarkable for any acute  findings.  No sign of acute CVA.  No large vessel stenosis or occlusion. Presented with subtherapeutic INR, 1.2 in the setting of chronic A. fib on Coumadin with medication noncompliance. 2D echo done on 05/14/2019 >>LVEF 40-45% with diffused global hypokinesis EEG no evidence of seizure activity.  Mild generalized slowing. Seen by neurology.  Resolved post repletion: Hypokalemia K+ 3.2, mg2+ 2.1>> 3.8 on 05/21/2019.  AKI on ckd2 suspect prerenal secondary to intravascular volume depletion Baseline cr 0.9 GFR 59 Cr 1.20>> 1.22 on 05/21/2019 Judicious with IV fluid hydration due to chronic systolic CHF with EF 40 to 45% Continue to monitor UO Continue to avoid nephrotoxins  Newly diagnosed acute left lower extremity DVT Coumadin held, was on hep drip>> until 05/16/19 Switched to eliquis on 05/16/19, continue   Leukocytosis, suspect reactive.   White count jumped this morning to 16,000 No sign of infectious process Urine culture done on 05/14/2019 suggested recollection Does not appear septic She has no complaints Procalcitonin lactic acid on 05/19/2019 - Repeat CBC>> wbc trending down without abx. Does not appear septic and has no new complaints.  Resolved uncontrolled hypertension, complicated by A. fib with slow ventricular response Decreased dose of Lopressor 12.5 mg twice daily Continue hydralazine, increase dose to 100 mg 3 times daily Restarted losartan 50 mg daily. Restarted p.o. Lasix 40 mg daily. Continue Kcl- po 20 meq daily while on diuretics. Follow-up with primary care provider  Chronic A. fib on Coumadin Presented with INR 1.2 Goal INR between 2 and 3: Was on Coumadin Coumadin was held Switched to Eliquis on 05/16/2019 Follow-up with your cardiologist  Chronic systolic CHF Euvolemic on exam 2D echo done during this admission showed LVEF 40 to 45% Continue strict I's and O's and daily weight Continue current cardiac medications Follow-up with your  cardiologist outpatient  Isolated elevated bilirubin Alkaline phosphatase,  AST ALT normal T bili 1.6 from 1.7 Follow up with your PCP  Dementia Reorient as needed Continue fall precautions  Hyperlipidemia LDL 85 on 05/16/2019 Continue Lipitor 20 mg daily  Hypothyroidism Continue levothyroxine 12.5 mg daily  Physical debility PT OT assessment recommended SNF CSW assisting with SNF placement Continue PT OT with assistance and fall precautions    Code Status:DNR- confirmed with family   Consults called:Neurology; PT/OT/ST/SW/Nutrition   Discharge Exam: BP (!) 151/71 (BP Location: Right Arm)    Pulse 81    Temp 98 F (36.7 C) (Oral)    Resp 18    Ht 5\' 6"  (1.676 m)    Wt 77.1 kg    SpO2 97%    BMI 27.44 kg/m   General: 83 y.o. year-old female pleasant well-developed malnourished no acute stress.  Alert and interactive.    Cardiovascular: Irregular rate and rhythm no rubs or gallops.  No JVD or thyromegaly noted.  Respiratory: Clear to Auscultation No Wheezes or Rales.  Abdomen: Soft normal bowel sounds present.  Nontender nondistended.    Musculoskeletal: Trace edema lower extremities.    Psychiatry: Mood is appropriate for condition and setting.  Discharge Instructions You were cared for by a hospitalist during your hospital stay. If you have any questions about your discharge medications or the care you received while you were in the hospital after you are  discharged, you can call the unit and asked to speak with the hospitalist on call if the hospitalist that took care of you is not available. Once you are discharged, your primary care physician will handle any further medical issues. Please note that NO REFILLS for any discharge medications will be authorized once you are discharged, as it is imperative that you return to your primary care physician (or establish a relationship with a primary care physician if you do not have one) for your aftercare needs  so that they can reassess your need for medications and monitor your lab values.   Allergies as of 05/22/2019      Reactions   Codeine    REACTION: vomiting      Medication List    STOP taking these medications   warfarin 5 MG tablet Commonly known as: COUMADIN     TAKE these medications   acetaminophen 500 MG tablet Commonly known as: TYLENOL Take 1,000 mg by mouth every 6 (six) hours as needed for headache (pain).   AeroChamber Plus inhaler Use as instructed   albuterol 108 (90 Base) MCG/ACT inhaler Commonly known as: VENTOLIN HFA Inhale 2 puffs into the lungs every 6 (six) hours as needed for wheezing or shortness of breath.   apixaban 5 MG Tabs tablet Commonly known as: Eliquis Take 2 tablets (10mg ) twice daily for 7 days, then 1 tablet (5mg ) twice daily   atorvastatin 20 MG tablet Commonly known as: LIPITOR Take 1 tablet (20 mg total) by mouth daily. What changed: when to take this   feeding supplement (ENSURE ENLIVE) Liqd Take 237 mLs by mouth 3 (three) times daily between meals for 7 days.   furosemide 40 MG tablet Commonly known as: LASIX Take 1 tablet (40 mg total) by mouth daily. What changed: when to take this   hydrALAZINE 100 MG tablet Commonly known as: APRESOLINE Take 1 tablet (100 mg total) by mouth every 8 (eight) hours.   levothyroxine 25 MCG tablet Commonly known as: SYNTHROID Take 0.5 tablets (12.5 mcg total) by mouth daily before breakfast.   losartan 100 MG tablet Commonly known as: COZAAR Take 1 tablet (100 mg total) by mouth daily.   metoprolol tartrate 25 MG tablet Commonly known as: LOPRESSOR Take 0.5 tablets (12.5 mg total) by mouth 2 (two) times daily.   Namzaric 28-10 MG Cp24 Generic drug: Memantine HCl-Donepezil HCl Take 1 capsule by mouth at bedtime. What changed: when to take this   potassium chloride SA 20 MEQ tablet Commonly known as: KLOR-CON Take 1 tablet (20 mEq total) by mouth daily. What changed: when to take  this      Allergies  Allergen Reactions   Codeine     REACTION: vomiting    Contact information for follow-up providers    , FNP. Call in 1 day(s).   Specialty: Family Medicine Why: Please call for a post hospital follow-up appointment. Contact information: [address] [place] [address] [place] [phone]        [id number], MD Follow up.   Specialty: Cardiology Why: Please call for a post hospital follow-up appointment. Contact information: [address] [place]elle [place]le [place] [phone]            Contact information for after-discharge care    Destination    [facility] Preferred SNF .   Service: Skilled Nursing Contact information: [address] [place] [address] [place] [phone]  The results of significant diagnostics from this hospitalization (including imaging, microbiology, ancillary and laboratory) are listed below for reference.    Significant Diagnostic Studies: Dg Chest 2 View  Result Date: 05/14/2019 CLINICAL DATA:  Syncope EXAM: CHEST - 2 VIEW COMPARISON:  04/07/2018 FINDINGS: Stable cardiomediastinal contours. Calcific aortic knob. Unchanged appearance of patchy interstitial opacity the medial aspect of the right lower lobe and peripheral aspect of the left lower lobe, likely chronic scarring or atelectasis. No new focal airspace consolidation no pleural effusion. No pneumothorax. No acute osseous findings. IMPRESSION: 1. No active cardiopulmonary disease. 2. Stable appearance of patchy interstitial opacity in the right lower lobe and peripheral aspect of the left lower lobe, likely scarring versus atelectasis. Electronically Signed   By: Duanne Guess M.D.   On: 05/14/2019 13:37   Ct Head Wo Contrast  Result Date: 05/14/2019 CLINICAL DATA:  Altered level of consciousness. Bowel incontinence. EXAM: CT HEAD WITHOUT CONTRAST TECHNIQUE: Contiguous axial images were  obtained from the base of the skull through the vertex without intravenous contrast. COMPARISON:  11/21/2013 head CT. FINDINGS: Brain: Chronic right frontal and right occipital encephalomalacia. No evidence of parenchymal hemorrhage or extra-axial fluid collection. No mass lesion, mass effect, or midline shift. No CT evidence of acute infarction. Nonspecific moderate subcortical and periventricular white matter hypodensity, most in keeping with chronic small vessel ischemic change. Generalized cerebral volume loss. Cerebral ventricle sizes are concordant with the degree of cerebral volume loss. Vascular: No acute abnormality. Skull: No evidence of calvarial fracture. Sinuses/Orbits: The visualized paranasal sinuses are essentially clear. Other:  The mastoid air cells are unopacified. IMPRESSION: 1. No evidence of acute intracranial abnormality. 2. Chronic right frontal and right occipital encephalomalacia. 3. Generalized cerebral volume loss and moderate chronic small vessel ischemic changes in the cerebral white matter. Electronically Signed   By: Delbert Phenix M.D.   On: 05/14/2019 13:30   Mr Angio Neck Wo Contrast  Result Date: 05/15/2019 CLINICAL DATA:  83 year old female with dementia and atrial fibrillation presenting with altered mental status. EXAM: MRI HEAD WITHOUT CONTRAST MRA NECK WITHOUT CONTRAST TECHNIQUE: Multiplanar, multiecho pulse sequences of the brain and surrounding structures were obtained without intravenous contrast. Angiographic images of the neck were obtained using MRA technique with and without intravenous contrast. Carotid stenosis measurements (when applicable) are obtained utilizing NASCET criteria, using the distal internal carotid diameter as the denominator. CONTRAST:  None. COMPARISON:  CT head 05/14/2019. Remote MR head 12/03/2005 FINDINGS: MRI HEAD FINDINGS Patient was uncooperative. Some series are degraded by motion or could not can be completed. Brain: No evidence for acute  infarction, hemorrhage, mass lesion, hydrocephalus, or extra-axial fluid. Generalized atrophy. Remote RIGHT frontal infarct/hematoma, with chronic blood products layering the encephalomalacia cavity. Also remote RIGHT occipital infarct is noted. Both these abnormalities have been present since 2007. T2 and FLAIR hyperintensities in the white matter favored to represent chronic microvascular ischemic change. Vascular: Flow voids are maintained. Skull and upper cervical spine: Normal marrow signal. Mild pannus. Sinuses/Orbits: No acute findings. Other: None. MRA NECK FINDINGS Noncontrast examination was performed. Conventional branching of the great vessels from the arch without visible proximal stenosis. Carotid bifurcations demonstrate no flow reducing lesion. Posterior wall plaque at the LEFT carotid bifurcation is suggested, without findings to suggest greater than 50% stenosis. BILATERAL vertebral arteries are patent, RIGHT dominant. IMPRESSION: 1. No acute intracranial findings. Atrophy and small vessel disease. Remote ischemic changes as described. 2. No extracranial or intracranial flow reducing lesion is evident. Electronically Signed  By: Elsie StainJohn T Curnes M.D.   On: 05/15/2019 13:58   Mr Brain Wo Contrast  Result Date: 05/15/2019 CLINICAL DATA:  83 year old female with dementia and atrial fibrillation presenting with altered mental status. EXAM: MRI HEAD WITHOUT CONTRAST MRA NECK WITHOUT CONTRAST TECHNIQUE: Multiplanar, multiecho pulse sequences of the brain and surrounding structures were obtained without intravenous contrast. Angiographic images of the neck were obtained using MRA technique with and without intravenous contrast. Carotid stenosis measurements (when applicable) are obtained utilizing NASCET criteria, using the distal internal carotid diameter as the denominator. CONTRAST:  None. COMPARISON:  CT head 05/14/2019. Remote MR head 12/03/2005 FINDINGS: MRI HEAD FINDINGS Patient was  uncooperative. Some series are degraded by motion or could not can be completed. Brain: No evidence for acute infarction, hemorrhage, mass lesion, hydrocephalus, or extra-axial fluid. Generalized atrophy. Remote RIGHT frontal infarct/hematoma, with chronic blood products layering the encephalomalacia cavity. Also remote RIGHT occipital infarct is noted. Both these abnormalities have been present since 2007. T2 and FLAIR hyperintensities in the white matter favored to represent chronic microvascular ischemic change. Vascular: Flow voids are maintained. Skull and upper cervical spine: Normal marrow signal. Mild pannus. Sinuses/Orbits: No acute findings. Other: None. MRA NECK FINDINGS Noncontrast examination was performed. Conventional branching of the great vessels from the arch without visible proximal stenosis. Carotid bifurcations demonstrate no flow reducing lesion. Posterior wall plaque at the LEFT carotid bifurcation is suggested, without findings to suggest greater than 50% stenosis. BILATERAL vertebral arteries are patent, RIGHT dominant. IMPRESSION: 1. No acute intracranial findings. Atrophy and small vessel disease. Remote ischemic changes as described. 2. No extracranial or intracranial flow reducing lesion is evident. Electronically Signed   By: Elsie StainJohn T Curnes M.D.   On: 05/15/2019 13:58   Vas Koreas Lower Extremity Venous (dvt) (only Mc & Wl 7a-7p)  Result Date: 05/15/2019  Lower Venous Study Indications: Pain, Swelling, and syncope.  Comparison Study: No prior Performing Technologist: Sherren Kernsandace Kanady RVS  Examination Guidelines: A complete evaluation includes B-mode imaging, spectral Doppler, color Doppler, and power Doppler as needed of all accessible portions of each vessel. Bilateral testing is considered an integral part of a complete examination. Limited examinations for reoccurring indications may be performed as noted.  +-----+---------------+---------+-----------+----------+--------------+   RIGHT Compressibility Phasicity Spontaneity Properties Thrombus Aging  +-----+---------------+---------+-----------+----------+--------------+  CFV   Full            Yes       Yes                                    +-----+---------------+---------+-----------+----------+--------------+   +---------+---------------+---------+-----------+----------+--------------+  LEFT      Compressibility Phasicity Spontaneity Properties Thrombus Aging  +---------+---------------+---------+-----------+----------+--------------+  CFV       Full            Yes       Yes                                    +---------+---------------+---------+-----------+----------+--------------+  SFJ       Full                                                             +---------+---------------+---------+-----------+----------+--------------+  FV Prox   Full                                                             +---------+---------------+---------+-----------+----------+--------------+  FV Mid    Full                                                             +---------+---------------+---------+-----------+----------+--------------+  FV Distal Full                                                             +---------+---------------+---------+-----------+----------+--------------+  PFV       Full                                                             +---------+---------------+---------+-----------+----------+--------------+  POP       Full            Yes       Yes                                    +---------+---------------+---------+-----------+----------+--------------+  PTV       None                                             Acute           +---------+---------------+---------+-----------+----------+--------------+  PERO      None                                             Acute           +---------+---------------+---------+-----------+----------+--------------+     Summary: Right: No evidence of common femoral vein  obstruction. Left: Findings consistent with acute deep vein thrombosis involving the left posterior tibial veins, and left peroneal veins.  *See table(s) above for measurements and observations. Electronically signed by Servando Snare MD on 05/15/2019 at 10:58:36 AM.    Final     Microbiology: Recent Results (from the past 240 hour(s))  Urine culture     Status: Abnormal   Collection Time: 05/14/19 12:58 PM   Specimen: Urine, Clean Catch  Result Value Ref Range Status   Specimen Description URINE, CLEAN CATCH  Final   Special Requests   Final    NONE Performed at [facility], [address], [place], [place] [address]    Culture MULTIPLE SPECIES PRESENT, SUGGEST RECOLLECTION (A)  Final   Report Status 05/15/2019 FINAL  Final  SARS CORONAVIRUS 2 (TAT 6-24 HRS) Nasopharyngeal Nasopharyngeal Swab     Status: None   Collection Time: 05/14/19  3:35 PM   Specimen: Nasopharyngeal Swab  Result Value Ref Range Status   SARS Coronavirus 2 NEGATIVE NEGATIVE Final    Comment: (NOTE) SARS-CoV-2 target nucleic acids are NOT DETECTED. The SARS-CoV-2 RNA is generally detectable in upper and lower respiratory specimens during the acute phase of infection. Negative results do not preclude SARS-CoV-2 infection, do not rule out co-infections with other pathogens, and should not be used as the sole basis for treatment or other patient management decisions. Negative results must be combined with clinical observations, patient history, and epidemiological information. The expected result is Negative. Fact Sheet for Patients: HairSlick.no Fact Sheet for Healthcare Providers: quierodirigir.com This test is not yet approved or cleared by the [place] FDA and  has been authorized for detection and/or diagnosis of SARS-CoV-2 by FDA under an Emergency Use Authorization (EUA). This EUA will remain  in effect (meaning this test can be used) for  the duration of the COVID-19 declaration under Section 56 4(b)(1) of the Act, 21 U.S.C. section 360bbb-3(b)(1), unless the authorization is terminated or revoked sooner. Performed at [facility], [address] [place], [place] [address]   SARS CORONAVIRUS 2 (TAT 6-24 HRS) Nasopharyngeal Nasopharyngeal Swab     Status: None   Collection Time: 05/18/19  8:53 AM   Specimen: Nasopharyngeal Swab  Result Value Ref Range Status   SARS Coronavirus 2 NEGATIVE NEGATIVE Final    Comment: (NOTE) SARS-CoV-2 target nucleic acids are NOT DETECTED. The SARS-CoV-2 RNA is generally detectable in upper and lower respiratory specimens during the acute phase of infection. Negative results do not preclude SARS-CoV-2 infection, do not rule out co-infections with other pathogens, and should not be used as the sole basis for treatment or other patient management decisions. Negative results must be combined with clinical observations, patient history, and epidemiological information. The expected result is Negative. Fact Sheet for Patients: HairSlick.no Fact Sheet for Healthcare Providers: quierodirigir.com This test is not yet approved or cleared by the [place] FDA and  has been authorized for detection and/or diagnosis of SARS-CoV-2 by FDA under an Emergency Use Authorization (EUA). This EUA will remain  in effect (meaning this test can be used) for the duration of the COVID-19 declaration under Section 56 4(b)(1) of the Act, 21 U.S.C. section 360bbb-3(b)(1), unless the authorization is terminated or revoked sooner. Performed at [facility], [address], [place], [place] [address]      Labs: Basic Metabolic Panel: Recent Labs  Lab 05/17/19 0305 05/18/19 0233 05/19/19 0323 05/20/19 0726 05/21/19 0650  NA 140 142 143 145 143  K 4.0 3.9 3.5 3.2* 3.8  CL 108 108 109 109 108  CO2 GLUCOSE 94 106* 116*  123* 128*  BUN 12 19 29* 33* 30*  CREATININE 0.92 1.11* 1.23* 1.20* 1.22*  CALCIUM 9.3 9.2 9.2 9.1 9.1  MG  --   --   --  2.1  --    Liver Function Tests: Recent Labs  Lab 05/16/19 0411 05/20/19 0726  AST 25 22  ALT 20 20  ALKPHOS 74 69  BILITOT 1.6* 0.9  PROT 6.0* 5.8*  ALBUMIN 3.2* 3.2*   No results for input(s): LIPASE, AMYLASE in the last 168 hours. No results for input(s): AMMONIA in the last 168 hours.  CBC: Recent Labs  Lab 05/19/19 0323 05/19/19 0544 05/19/19 1646 05/20/19 0726 05/21/19 0650  WBC 16.9* DUPL SEE [id number] 16.2* 12.0* 12.2*  NEUTROABS 13.6* PENDING  --  9.1*  --   HGB 15.0 DUPL SEE [id number] 15.6* 14.3 14.8  HCT 45.3 DUPL SEE [id number] 46.5* 42.8 45.1  MCV 94.0 DUPL SEE [id number] 94.1 93.9 95.6  PLT 261 DUPL SEE [id number] 249 249 255   Cardiac Enzymes: No results for input(s): CKTOTAL, CKMB, CKMBINDEX, TROPONINI in the last 168 hours. BNP: BNP (last 3 results) No results for input(s): BNP in the last 8760 hours.  ProBNP (last 3 results) No results for input(s): PROBNP in the last 8760 hours.  CBG: No results for input(s): GLUCAP in the last 168 hours.     Signed:  Darlin Droparole N Lajoy [place], MD [facility] 05/22/2019, 2:45 PM

## 2019-05-23 LAB — BASIC METABOLIC PANEL
Anion gap: 12 (ref 5–15)
BUN: 38 mg/dL — ABNORMAL HIGH (ref 8–23)
CO2: 27 mmol/L (ref 22–32)
Calcium: 9.3 mg/dL (ref 8.9–10.3)
Chloride: 108 mmol/L (ref 98–111)
Creatinine, Ser: 1.28 mg/dL — ABNORMAL HIGH (ref 0.44–1.00)
GFR calc Af Amer: 46 mL/min — ABNORMAL LOW (ref >60)
GFR calc non Af Amer: 39 mL/min — ABNORMAL LOW (ref >60)
Glucose, Bld: 95 mg/dL (ref 70–99)
Potassium: 3.6 mmol/L (ref 3.5–5.1)
Sodium: 147 mmol/L — ABNORMAL HIGH (ref 135–145)

## 2019-05-23 LAB — CBC
HCT: 41.7 % (ref 36.0–46.0)
Hemoglobin: 13.7 g/dL (ref 12.0–15.0)
MCH: 31.4 pg (ref 26.0–34.0)
MCHC: 32.9 g/dL (ref 30.0–36.0)
MCV: 95.4 fL (ref 80.0–100.0)
Platelets: 312 10*3/uL (ref 150–400)
RBC: 4.37 MIL/uL (ref 3.87–5.11)
RDW: 13.3 % (ref 11.5–15.5)
WBC: 7.3 10*3/uL (ref 4.0–10.5)
nRBC: 0 % (ref 0.0–0.2)

## 2019-05-23 LAB — PROCALCITONIN: Procalcitonin: <0.1 ng/mL

## 2019-05-23 LAB — MAGNESIUM: Magnesium: 2.4 mg/dL (ref 1.7–2.4)

## 2019-05-23 NOTE — TOC Progression Note (Signed)
Transition of Care [facility]) - Progression Note    Patient Details  Name: Wanda Mckenzie MRN: [id number] Date of Birth: June 01, 1936  [facility] CM/SW Contact  Pollie Friar, RN Phone Number: 05/23/2019, 2:34 PM  Clinical Narrative:    Pt denied SNF rehab under her [place]. CM has informed her son: Eddie Dibbles. He originally said the patient could return to her spouse. CM called APS and they state she is NOT to return to spouse unless someone else is in the home 24/7. CM updated Paul. They are unable to provide this or have the patient come to another family members home. They are asking for LT care. CM encouraged Eddie Dibbles to call DSS to see about applying for Medicaid and what spend down they will have to do to qualify for Medicaid. He voiced understanding.  Eddie Dibbles is interested in a facility in [place], [place]. CM is awaiting the name of the facility. CM will updated APS worker Marina Goodell: [phone]).    Expected Discharge Plan: [place] Barriers to Discharge: Ship broker, Continued Medical Work up  Expected Discharge Plan and Services Expected Discharge Plan: [place] In-house Referral: Clinical Social Work Discharge Planning Services: NA Post Acute Care Choice: [place] Living arrangements for the past 2 months: Single Family Home Expected Discharge Date: 05/18/19               DME Arranged: N/A DME Agency: NA                   Social Determinants of Health (SDOH) Interventions    Readmission Risk Interventions No flowsheet data found.

## 2019-05-23 NOTE — Progress Notes (Signed)
Nutrition Follow-up  DOCUMENTATION CODES:   Not applicable  INTERVENTION:  Continue Ensure Enlive po TID, each supplement provides 350 kcal and 20 grams of protein  Encourage adequate PO intake.   NUTRITION DIAGNOSIS:   Inadequate oral intake related to lethargy/confusion(dementia) as evidenced by per patient/family report, meal completion < 50%; improving  GOAL:   Patient will meet greater than or equal to 90% of their needs; progressing  MONITOR:   PO intake, Supplement acceptance, Weight trends, Labs, Skin, I & O's  REASON FOR ASSESSMENT:   Consult Assessment of nutrition requirement/status  ASSESSMENT:   83 y.o. female with medical history significant of CVA; HTN; HLD; COPD; dementia; and afib admitted with AMS  Meal completion has been 50-75%. Pt currently has Ensure ordered and has been consuming them. RD to continue with current orders to aid in caloric and protein needs. Plans for discharge to SNF when bed available. Recommend continuation of nutritional supplements post discharge to ensure adequate nutrition is met.    Labs and medications reviewed.   Diet Order:   Diet Order            Diet Heart Fluid consistency: Thin  Diet effective ____              EDUCATION NEEDS:   Not appropriate for education at this time  Skin:  Skin Assessment: Reviewed RN Assessment  Last BM:  11/28  Height:   Ht Readings from Last 1 Encounters:  05/14/19 _0  (1.676 m)    Weight:   Wt Readings from Last 1 Encounters:  05/14/19 77.1 kg    Ideal Body Weight:  59 kg  BMI:  Body mass index is 27.44 kg/m.  Estimated Nutritional Needs:   Kcal:  1600-1800  Protein:  80-90 grams  Fluid:  >/= 1.5 L    Corrin Parker, MS, RD, LDN Pager # [phone] After hours/ weekend pager # [phone]

## 2019-05-23 NOTE — Progress Notes (Signed)
Discharge Summary  Wanda Mckenzie:[id number] DOB: 1936-05-17  PCP: [name], FNP  Admit date: 05/14/2019 Discharge date: 05/23/2019  Time spent: 35 minutes  Recommendations for Outpatient Follow-up:  1. Follow-up with your PCP 2. Follow-up with your cardiologist 3. Take your medications as prescribed 4. Continue physical therapy 5. Fall precautions  Discharge Diagnoses:  Active Hospital Problems   Diagnosis Date Noted   AMS (altered mental status) 05/14/2019   Left leg DVT (HCC) 05/14/2019   Stage 3b chronic kidney disease 05/14/2019   Chronic congestive heart failure (HCC) 01/10/2019   Hyperlipidemia 06/19/2016   Vascular dementia without behavioral disturbance (HCC) 06/08/2014   Essential hypertension    Chronic atrial fibrillation (HCC)    COPD (chronic obstructive pulmonary disease) (HCC)     Resolved Hospital Problems  No resolved problems to display.    Discharge Condition: Stable  Diet recommendation: Resume previous diet  Vitals:   05/23/19 0820 05/23/19 1117  BP: (!) 159/79 (!) 156/65  Pulse: 74 76  Resp: 18 18  Temp: 98.6 F (37 C) 98.9 F (37.2 C)  SpO2: 95% 94%    History of present illness:  Wanda J Nelsonis a 83 y.o.femalewith medical history significant ofCVA; HTN; HLD; COPD; dementia; and afib presenting with AMS.Patient has advanced dementia and is unable to answer questions. I called and discussed the patient's husband. He reports that a caregiver was there when episode happened. Sat and ate a few bites, looked like she was walking to fall out of the chair and so they laid her in the floor and called 911. She did not lose consciousness. She has been refusing her medications some, would not eat or take meds the last few days.    ED Course:Dementia, presenting with AMS, ?history. Slumped over, lost control of bowel/bladder. Back to baseline upon EMS arrival. ?LLE weakness. LLE pain, +DVT despite  Coumadin for afib (INR subtherapeutic). ?CVA/TIA, seizure. Seen by neurology.  No evidence of acute CVA on MRI brain.  EEG did not show seizure activity.  11/25: Patient was seen and examined at her bedside this morning.  No acute events overnight.  She has no new complaints.  She is alert and pleasant in a state of dementia.  05/19/19: Patient was seen and examined at her bedside this morning.  No acute events overnight.  She has no new complaints.  She denies chest pain, cough, dyspnea, abdominal pain, nausea, lower extremity pain.  She does not appear septic.  Leukocytosis noted this morning with WBC increased to 16,000.  Procalcitonin and lactic acid negative.  Repeating CBC today to rule out lab error.  Afebrile with no sign of active infective process.  Repeat COVID-19 done on 05/18/2019 was negative.  05/22/19: Patient was seen and examined at bedside this morning.  No acute events overnight.  She has no new complaints.  She has been evaluated by PT OT with recommendation for SNF.  CSW has been consulted and assisting with SNF placement.  05/23/19: Seen and examined at bedside this morning.  No acute events overnight.  She has no new complaints.   Hospital Course:  Principal Problem:   AMS (altered mental status) Active Problems:   COPD (chronic obstructive pulmonary disease) (HCC)   Chronic atrial fibrillation (HCC)   Essential hypertension   Hyperlipidemia   Vascular dementia without behavioral disturbance (HCC)   Chronic congestive heart failure (HCC)   Left leg DVT (HCC)   Stage 3b chronic kidney disease  Resolved acute metabolic encephalopathy  in the context of dementia/CVA work-up, ruled out/seizure work-up, EEG negative with no seizure-like movement while inpatient. Presented from home where she was slumped over and was incontinent of bowel and bladder Differential diagnosis includes seizure versus TIA/stroke versus syncope versus transient alteration of consciousness in  the context of advanced dementia. MRI brain without contrast MRI neck without contrast unremarkable for any acute findings.  No sign of acute CVA.  No large vessel stenosis or occlusion. Presented with subtherapeutic INR, 1.2 in the setting of chronic A. fib on Coumadin with medication noncompliance. 2D echo done on 05/14/2019 >>LVEF 40-45% with diffused global hypokinesis EEG no evidence of seizure activity.  Mild generalized slowing. Seen by neurology.  Resolved post repletion: Hypokalemia K+ 3.2, mg2+ 2.1>> 3.8 on 05/21/2019.  AKI on ckd2 suspect prerenal secondary to intravascular volume depletion Baseline cr 0.9 GFR 59 Cr 1.20>> 1.22 on 05/21/2019 Judicious with IV fluid hydration due to chronic systolic CHF with EF 40 to 45% Continue to monitor UO Continue to avoid nephrotoxins  Newly diagnosed acute left lower extremity DVT Coumadin held, was on hep drip>> until 05/16/19 Switched to eliquis on 05/16/19, continue   Leukocytosis, suspect reactive.   White count jumped this morning to 16,000 No sign of infectious process Urine culture done on 05/14/2019 suggested recollection Does not appear septic She has no complaints Procalcitonin lactic acid on 05/19/2019 - Repeat CBC>> wbc trending down without abx. Does not appear septic and has no new complaints.  Resolved uncontrolled hypertension, complicated by A. fib with slow ventricular response Decreased dose of Lopressor 12.5 mg twice daily Continue hydralazine, increase dose to 100 mg 3 times daily Restarted losartan 50 mg daily. Restarted p.o. Lasix 40 mg daily. Continue Kcl- po 20 meq daily while on diuretics. Follow-up with primary care provider  Chronic A. fib on Coumadin Presented with INR 1.2 Goal INR between 2 and 3: Was on Coumadin Coumadin was held Switched to Eliquis on 05/16/2019 Follow-up with your cardiologist  Chronic systolic CHF Euvolemic on exam 2D echo done during this admission showed LVEF 40 to  45% Continue strict I's and O's and daily weight Continue current cardiac medications Follow-up with your cardiologist outpatient  Isolated elevated bilirubin Alkaline phosphatase,  AST ALT normal T bili 1.6 from 1.7 Follow up with your PCP  Dementia Reorient as needed Continue fall precautions  Hyperlipidemia LDL 85 on 05/16/2019 Continue Lipitor 20 mg daily  Hypothyroidism Continue levothyroxine 12.5 mg daily  Physical debility PT OT assessment recommended SNF CSW assisting with SNF placement Continue PT OT with assistance and fall precautions    Code Status:DNR- confirmed with family   Consults called:Neurology; PT/OT/ST/SW/Nutrition   Discharge Exam: BP (!) 156/65 (BP Location: Left Arm)    Pulse 76    Temp 98.9 F (37.2 C) (Oral)    Resp 18    Ht 5\' 6"  (1.676 m)    Wt 77.1 kg    SpO2 94%    BMI 27.44 kg/m   General: 83 y.o. year-old female well-developed well-nourished no acute distress.  Alert and interactive.    Respiratory: Clear to auscultation no wheezes or rales.  Poor inspiratory effort.  Cardiovascular: Irregular rate and rhythm no rubs or gallops.    Abdomen: Soft, bowel sounds x4.  Nontender.    Musculoskeletal: Trace lower extremity edema bilaterally.  Psychiatry: Mood is appropriate for condition and setting.  Discharge Instructions You were cared for by a hospitalist during your hospital stay. If you have any questions about your  discharge medications or the care you received while you were in the hospital after you are discharged, you can call the unit and asked to speak with the hospitalist on call if the hospitalist that took care of you is not available. Once you are discharged, your primary care physician will handle any further medical issues. Please note that NO REFILLS for any discharge medications will be authorized once you are discharged, as it is imperative that you return to your primary care physician (or establish a  relationship with a primary care physician if you do not have one) for your aftercare needs so that they can reassess your need for medications and monitor your lab values.   Allergies as of 05/23/2019      Reactions   Codeine    REACTION: vomiting      Medication List    STOP taking these medications   warfarin 5 MG tablet Commonly known as: COUMADIN     TAKE these medications   acetaminophen 500 MG tablet Commonly known as: TYLENOL Take 1,000 mg by mouth every 6 (six) hours as needed for headache (pain).   AeroChamber Plus inhaler Use as instructed   albuterol 108 (90 Base) MCG/ACT inhaler Commonly known as: VENTOLIN HFA Inhale 2 puffs into the lungs every 6 (six) hours as needed for wheezing or shortness of breath.   apixaban 5 MG Tabs tablet Commonly known as: Eliquis Take 2 tablets ( ) twice daily for 7 days, then 1 tablet ( ) twice daily   atorvastatin 20 MG tablet Commonly known as: LIPITOR Take 1 tablet (20 mg total) by mouth daily. What changed: when to take this   feeding supplement (ENSURE ENLIVE) Liqd Take 237 mLs by mouth 3 (three) times daily between meals for 7 days.   furosemide 40 MG tablet Commonly known as: LASIX Take 1 tablet (40 mg total) by mouth daily. What changed: when to take this   hydrALAZINE 100 MG tablet Commonly known as: APRESOLINE Take 1 tablet (100 mg total) by mouth every 8 (eight) hours.   levothyroxine 25 MCG tablet Commonly known as: SYNTHROID Take 0.5 tablets (12.5 mcg total) by mouth daily before breakfast.   losartan 100 MG tablet Commonly known as: COZAAR Take 1 tablet (100 mg total) by mouth daily.   metoprolol tartrate 25 MG tablet Commonly known as: LOPRESSOR Take 0.5 tablets (12.5 mg total) by mouth 2 (two) times daily.   Namzaric 28-10 MG Cp24 Generic drug: Memantine HCl-Donepezil HCl Take 1 capsule by mouth at bedtime. What changed: when to take this   potassium chloride SA 20 MEQ tablet Commonly  known as: KLOR-CON Take 1 tablet (20 mEq total) by mouth daily. What changed: when to take this            Durable Medical Equipment  (From admission, onward)         Start     Ordered   05/23/19 1334  For home use only DME wheelchair cushion (seat and back)  Once     05/23/19 1334   05/23/19 1332  For home use only DME lightweight manual wheelchair with seat cushion  Once    Comments: Patient suffers from ambulatory dysfunction which impairs their ability to perform daily activities like walking in the home.  A walker will not resolve  issue with performing activities of daily living. A wheelchair will allow patient to safely perform daily activities. Patient is not able to propel themselves in the home using a standard weight wheelchair  due to weakness. Patient can self propel in the lightweight wheelchair. Length of need lifetime. Accessories: elevating leg rests (ELRs), wheel locks, extensions and anti-tippers.   05/23/19 1334   05/23/19 1250  For home use only DME standard manual wheelchair with seat cushion  Once    Comments: Patient suffers from ambulatory dysfunction which impairs their ability to perform daily activities like walking in the home.  A walking aid will not resolve issue with performing activities of daily living. A wheelchair will allow patient to safely perform daily activities. Patient can safely propel the wheelchair in the home or has a caregiver who can provide assistance. Length of need 6 months. Accessories: elevating leg rests, wheel locks, extensions and anti-tippers.   05/23/19 1250         Allergies  Allergen Reactions   Codeine     REACTION: vomiting    Contact information for follow-up providers    [name], FNP. Call in 1 day(s).   Specialty: Family Medicine Why: Please call for a post hospital follow-up appointment. Contact information: [address] [place] [address] [phone]        Jonelle Sidle, MD Follow  up.   Specialty: Cardiology Why: Please call for a post hospital follow-up appointment. Contact information: [address] [place] [address] [phone]            Contact information for after-discharge care    Destination    [facility] Preferred SNF .   Service: Skilled Nursing Contact information: [address] [place] [address] [phone]                   The results of significant diagnostics from this hospitalization (including imaging, microbiology, ancillary and laboratory) are listed below for reference.    Significant Diagnostic Studies: Dg Chest 2 View  Result Date: 05/14/2019 CLINICAL DATA:  Syncope EXAM: CHEST - 2 VIEW COMPARISON:  04/07/2018 FINDINGS: Stable cardiomediastinal contours. Calcific aortic knob. Unchanged appearance of patchy interstitial opacity the medial aspect of the right lower lobe and peripheral aspect of the left lower lobe, likely chronic scarring or atelectasis. No new focal airspace consolidation no pleural effusion. No pneumothorax. No acute osseous findings. IMPRESSION: 1. No active cardiopulmonary disease. 2. Stable appearance of patchy interstitial opacity in the right lower lobe and peripheral aspect of the left lower lobe, likely scarring versus atelectasis. Electronically Signed   By: Duanne Guess M.D.   On: 05/14/2019 13:37   Ct Head Wo Contrast  Result Date: 05/14/2019 CLINICAL DATA:  Altered level of consciousness. Bowel incontinence. EXAM: CT HEAD WITHOUT CONTRAST TECHNIQUE: Contiguous axial images were obtained from the base of the skull through the vertex without intravenous contrast. COMPARISON:  11/21/2013 head CT. FINDINGS: Brain: Chronic right frontal and right occipital encephalomalacia. No evidence of parenchymal hemorrhage or extra-axial fluid collection. No mass lesion, mass effect, or midline shift. No CT evidence of acute infarction. Nonspecific moderate subcortical and  periventricular white matter hypodensity, most in keeping with chronic small vessel ischemic change. Generalized cerebral volume loss. Cerebral ventricle sizes are concordant with the degree of cerebral volume loss. Vascular: No acute abnormality. Skull: No evidence of calvarial fracture. Sinuses/Orbits: The visualized paranasal sinuses are essentially clear. Other:  The mastoid air cells are unopacified. IMPRESSION: 1. No evidence of acute intracranial abnormality. 2. Chronic right frontal and right occipital encephalomalacia. 3. Generalized cerebral volume loss and moderate chronic small vessel ischemic changes in the cerebral white  matter. Electronically Signed   By: Delbert PhenixJason A Poff M.D.   On: 05/14/2019 13:30   Mr Angio Neck Wo Contrast  Result Date: 05/15/2019 CLINICAL DATA:  83 year old female with dementia and atrial fibrillation presenting with altered mental status. EXAM: MRI HEAD WITHOUT CONTRAST MRA NECK WITHOUT CONTRAST TECHNIQUE: Multiplanar, multiecho pulse sequences of the brain and surrounding structures were obtained without intravenous contrast. Angiographic images of the neck were obtained using MRA technique with and without intravenous contrast. Carotid stenosis measurements (when applicable) are obtained utilizing NASCET criteria, using the distal internal carotid diameter as the denominator. CONTRAST:  None. COMPARISON:  CT head 05/14/2019. Remote MR head 12/03/2005 FINDINGS: MRI HEAD FINDINGS Patient was uncooperative. Some series are degraded by motion or could not can be completed. Brain: No evidence for acute infarction, hemorrhage, mass lesion, hydrocephalus, or extra-axial fluid. Generalized atrophy. Remote RIGHT frontal infarct/hematoma, with chronic blood products layering the encephalomalacia cavity. Also remote RIGHT occipital infarct is noted. Both these abnormalities have been present since 2007. T2 and FLAIR hyperintensities in the white matter favored to represent chronic  microvascular ischemic change. Vascular: Flow voids are maintained. Skull and upper cervical spine: Normal marrow signal. Mild pannus. Sinuses/Orbits: No acute findings. Other: None. MRA NECK FINDINGS Noncontrast examination was performed. Conventional branching of the great vessels from the arch without visible proximal stenosis. Carotid bifurcations demonstrate no flow reducing lesion. Posterior wall plaque at the LEFT carotid bifurcation is suggested, without findings to suggest greater than 50% stenosis. BILATERAL vertebral arteries are patent, RIGHT dominant. IMPRESSION: 1. No acute intracranial findings. Atrophy and small vessel disease. Remote ischemic changes as described. 2. No extracranial or intracranial flow reducing lesion is evident. Electronically Signed   By: Elsie StainJohn T Curnes M.D.   On: 05/15/2019 13:58   Mr Brain Wo Contrast  Result Date: 05/15/2019 CLINICAL DATA:  83 year old female with dementia and atrial fibrillation presenting with altered mental status. EXAM: MRI HEAD WITHOUT CONTRAST MRA NECK WITHOUT CONTRAST TECHNIQUE: Multiplanar, multiecho pulse sequences of the brain and surrounding structures were obtained without intravenous contrast. Angiographic images of the neck were obtained using MRA technique with and without intravenous contrast. Carotid stenosis measurements (when applicable) are obtained utilizing NASCET criteria, using the distal internal carotid diameter as the denominator. CONTRAST:  None. COMPARISON:  CT head 05/14/2019. Remote MR head 12/03/2005 FINDINGS: MRI HEAD FINDINGS Patient was uncooperative. Some series are degraded by motion or could not can be completed. Brain: No evidence for acute infarction, hemorrhage, mass lesion, hydrocephalus, or extra-axial fluid. Generalized atrophy. Remote RIGHT frontal infarct/hematoma, with chronic blood products layering the encephalomalacia cavity. Also remote RIGHT occipital infarct is noted. Both these abnormalities have been  present since 2007. T2 and FLAIR hyperintensities in the white matter favored to represent chronic microvascular ischemic change. Vascular: Flow voids are maintained. Skull and upper cervical spine: Normal marrow signal. Mild pannus. Sinuses/Orbits: No acute findings. Other: None. MRA NECK FINDINGS Noncontrast examination was performed. Conventional branching of the great vessels from the arch without visible proximal stenosis. Carotid bifurcations demonstrate no flow reducing lesion. Posterior wall plaque at the LEFT carotid bifurcation is suggested, without findings to suggest greater than 50% stenosis. BILATERAL vertebral arteries are patent, RIGHT dominant. IMPRESSION: 1. No acute intracranial findings. Atrophy and small vessel disease. Remote ischemic changes as described. 2. No extracranial or intracranial flow reducing lesion is evident. Electronically Signed   By: Elsie StainJohn T Curnes M.D.   On: 05/15/2019 13:58   Vas Koreas Lower Extremity Venous (dvt) (only Mc & Wl  7a-7p)  Result Date: 05/15/2019  Lower Venous Study Indications: Pain, Swelling, and syncope.  Comparison Study: No prior Performing Technologist: Sherren Kerns RVS  Examination Guidelines: A complete evaluation includes B-mode imaging, spectral Doppler, color Doppler, and power Doppler as needed of all accessible portions of each vessel. Bilateral testing is considered an integral part of a complete examination. Limited examinations for reoccurring indications may be performed as noted.  +-----+---------------+---------+-----------+----------+--------------+  RIGHT Compressibility Phasicity Spontaneity Properties Thrombus Aging  +-----+---------------+---------+-----------+----------+--------------+  CFV   Full            Yes       Yes                                    +-----+---------------+---------+-----------+----------+--------------+   +---------+---------------+---------+-----------+----------+--------------+  LEFT       Compressibility Phasicity Spontaneity Properties Thrombus Aging  +---------+---------------+---------+-----------+----------+--------------+  CFV       Full            Yes       Yes                                    +---------+---------------+---------+-----------+----------+--------------+  SFJ       Full                                                             +---------+---------------+---------+-----------+----------+--------------+  FV Prox   Full                                                             +---------+---------------+---------+-----------+----------+--------------+  FV Mid    Full                                                             +---------+---------------+---------+-----------+----------+--------------+  FV Distal Full                                                             +---------+---------------+---------+-----------+----------+--------------+  PFV       Full                                                             +---------+---------------+---------+-----------+----------+--------------+  POP       Full            Yes       Yes                                    +---------+---------------+---------+-----------+----------+--------------+  PTV       None                                             Acute           +---------+---------------+---------+-----------+----------+--------------+  PERO      None                                             Acute           +---------+---------------+---------+-----------+----------+--------------+     Summary: Right: No evidence of common femoral vein obstruction. Left: Findings consistent with acute deep vein thrombosis involving the left posterior tibial veins, and left peroneal veins.  *See table(s) above for measurements and observations. Electronically signed by Servando Snare MD on 05/15/2019 at 10:58:36 AM.    Final     Microbiology: Recent Results (from the past 240 hour(s))  Urine culture     Status: Abnormal    Collection Time: 05/14/19 12:58 PM   Specimen: Urine, Clean Catch  Result Value Ref Range Status   Specimen Description URINE, CLEAN CATCH  Final   Special Requests   Final    NONE Performed at [facility], [address] [place], [place] [address]    Culture MULTIPLE SPECIES PRESENT, SUGGEST RECOLLECTION (A)  Final   Report Status 05/15/2019 FINAL  Final  SARS CORONAVIRUS 2 (TAT 6-24 HRS) Nasopharyngeal Nasopharyngeal Swab     Status: None   Collection Time: 05/14/19  3:35 PM   Specimen: Nasopharyngeal Swab  Result Value Ref Range Status   SARS Coronavirus 2 NEGATIVE NEGATIVE Final    Comment: (NOTE) SARS-CoV-2 target nucleic acids are NOT DETECTED. The SARS-CoV-2 RNA is generally detectable in upper and lower respiratory specimens during the acute phase of infection. Negative results do not preclude SARS-CoV-2 infection, do not rule out co-infections with other pathogens, and should not be used as the sole basis for treatment or other patient management decisions. Negative results must be combined with clinical observations, patient history, and epidemiological information. The expected result is Negative. Fact Sheet for Patients: SugarRoll.be Fact Sheet for Healthcare Providers: https://www.woods-mathews.com/ This test is not yet approved or cleared by the [place] FDA and  has been authorized for detection and/or diagnosis of SARS-CoV-2 by FDA under an Emergency Use Authorization (EUA). This EUA will remain  in effect (meaning this test can be used) for the duration of the COVID-19 declaration under Section 56 4(b)(1) of the Act, 21 U.S.C. section 360bbb-3(b)(1), unless the authorization is terminated or revoked sooner. Performed at [facility], [address], [place], [place] [address]   SARS CORONAVIRUS 2 (TAT 6-24 HRS) Nasopharyngeal Nasopharyngeal Swab     Status: None   Collection Time: 05/18/19  8:53 AM    Specimen: Nasopharyngeal Swab  Result Value Ref Range Status   SARS Coronavirus 2 NEGATIVE NEGATIVE Final    Comment: (NOTE) SARS-CoV-2 target nucleic acids are NOT DETECTED. The SARS-CoV-2 RNA is generally detectable in upper and lower respiratory specimens during the acute phase of infection. Negative results do not preclude SARS-CoV-2 infection, do not rule out co-infections with other pathogens, and should not be used as the sole basis for treatment or other patient management decisions. Negative  results must be combined with clinical observations, patient history, and epidemiological information. The expected result is Negative. Fact Sheet for Patients: HairSlick.no Fact Sheet for Healthcare Providers: quierodirigir.com This test is not yet approved or cleared by the [place] FDA and  has been authorized for detection and/or diagnosis of SARS-CoV-2 by FDA under an Emergency Use Authorization (EUA). This EUA will remain  in effect (meaning this test can be used) for the duration of the COVID-19 declaration under Section 56 4(b)(1) of the Act, 21 U.S.C. section 360bbb-3(b)(1), unless the authorization is terminated or revoked sooner. Performed at [facility], [address], [place], [place] [address]      Labs: Basic Metabolic Panel: Recent Labs  Lab 05/18/19 0233 05/19/19 0323 05/20/19 0726 05/21/19 0650 05/23/19 0647  NA 142 143 145 143 147*  K 3.9 3.5 3.2* 3.8 3.6  CL 108 109 109 108 108  CO2 22 23 24 23 27   GLUCOSE 106* 116* 123* 128* 95  BUN 19 29* 33* 30* 38*  CREATININE 1.11* 1.23* 1.20* 1.22* 1.28*  CALCIUM 9.2 9.2 9.1 9.1 9.3  MG  --   --  2.1  --  2.4   Liver Function Tests: Recent Labs  Lab 05/20/19 0726  AST 22  ALT 20  ALKPHOS 69  BILITOT 0.9  PROT 5.8*  ALBUMIN 3.2*   No results for input(s): LIPASE, AMYLASE in the last 168 hours. No results for input(s): AMMONIA in the last  168 hours. CBC: Recent Labs  Lab 05/19/19 0323 05/19/19 0544 05/19/19 1646 05/20/19 0726 05/21/19 0650 05/23/19 0647  WBC 16.9* DUPL SEE 05/25/19 16.2* 12.0* 12.2* 7.3  NEUTROABS 13.6* PENDING  --  9.1*  --   --   HGB 15.0 DUPL SEE [id number] 15.6* 14.3 14.8 13.7  HCT 45.3 DUPL SEE [id number] 46.5* 42.8 45.1 41.7  MCV 94.0 DUPL SEE [id number] 94.1 93.9 95.6 95.4  PLT 261 DUPL SEE [id number] 249 249 255 312   Cardiac Enzymes: No results for input(s): CKTOTAL, CKMB, CKMBINDEX, TROPONINI in the last 168 hours. BNP: BNP (last 3 results) No results for input(s): BNP in the last 8760 hours.  ProBNP (last 3 results) No results for input(s): PROBNP in the last 8760 hours.  CBG: No results for input(s): GLUCAP in the last 168 hours.     Signed:  [id number], MD [facility] 05/23/2019, 3:23 PM

## 2019-05-23 NOTE — Progress Notes (Addendum)
Wheelchair:   Patient suffers from ambulatory dysfunction which impairs their ability to perform daily activities like walking in the home. A walker will not resolve  issue with performing activities of daily living. A wheelchair will allow patient to safely perform daily activities. Patient is not able to propel themselves in the home using a standard weight wheelchair due to weakness. Patient can self propel in the lightweight wheelchair. Length of need lifetime.  Accessories: elevating leg rests (ELRs), wheel locks, extensions and anti-tippers

## 2019-05-23 NOTE — [place] FL2 (Signed)
[place] MEDICAID FL2 LEVEL OF CARE SCREENING TOOL     IDENTIFICATION  Patient Name: Wanda Mckenzie Birthdate: [birth year] Sex: female Admission Date (Current Location): 05/14/2019  [facility] and [place] Number:  [place] and Address:  [facility], [address] [place], [place] [address]      Provider Number: [id number]  Attending Physician Name and Address:  Darlin Drop, DO  Relative Name and Phone Number:  Tretha Sciara [phone] & [address] [place], [phone]    Current Level of Care: Hospital Recommended Level of Care: Nursing Facility Prior Approval Number:    Date Approved/Denied:   PASRR Number: [id number]  Discharge Plan: Domiciliary (Rest home)(Pt is needing long term care)    Current Diagnoses: Patient Active Problem List   Diagnosis Date Noted  . AMS (altered mental status) 05/14/2019  . Left leg DVT (HCC) 05/14/2019  . Stage 3b chronic kidney disease 05/14/2019  . Elevated TSH 04/13/2019  . Chronic congestive heart failure (HCC) 01/10/2019  . Late effects of CVA (cerebrovascular accident) 04/06/2018  . Hyperlipidemia 06/19/2016  . Vascular dementia without behavioral disturbance (HCC) 06/08/2014  . Varicose veins of lower extremities with other complications 02/02/2013  . Essential hypertension   . Aortic insufficiency   . COPD (chronic obstructive pulmonary disease) (HCC)   . Chronic atrial fibrillation (HCC)   . Carotid bruit   . Warfarin anticoagulation     Orientation RESPIRATION BLADDER Height & Weight     Self  Normal Incontinent Weight: 77.1 kg Height:  5\' 6"  (167.6 cm)  BEHAVIORAL SYMPTOMS/MOOD NEUROLOGICAL BOWEL NUTRITION STATUS      Continent Diet(cardiac with thin liquids)  AMBULATORY STATUS COMMUNICATION OF NEEDS Skin   Extensive Assist Verbally Normal                       Personal Care Assistance Level of Assistance  Bathing, Feeding, Dressing, Total care Bathing  Assistance: Maximum assistance Feeding assistance: Limited assistance Dressing Assistance: Maximum assistance Total Care Assistance: Maximum assistance   Functional Limitations Info  Sight, Hearing, Speech Sight Info: Adequate Hearing Info: Adequate Speech Info: Adequate    SPECIAL CARE FACTORS FREQUENCY  PT (By licensed PT), OT (By licensed OT)     PT Frequency: 5x/wk OT Frequency: 5x/wk            Contractures Contractures Info: Not present    Additional Factors Info  Code Status, Allergies Code Status Info: DNR Allergies Info: Codeine           Current Medications (05/23/2019):  This is the current hospital active medication list Current Facility-Administered Medications  Medication Dose Route Frequency Provider Last Rate Last Dose  . acetaminophen (TYLENOL) tablet 650 mg  650 mg Oral Q4H PRN 05/25/2019, MD   650 mg at 05/17/19 05/19/19   Or  . acetaminophen (TYLENOL) 160 MG/5ML solution 650 mg  650 mg Per Tube Q4H PRN [id number], MD       Or  . acetaminophen (TYLENOL) suppository 650 mg  650 mg Rectal Q4H PRN Jonah Blue, MD      . albuterol (PROVENTIL) (2.5 MG/3ML) 0.083% nebulizer solution 3 mL  3 mL Inhalation Q6H PRN Jonah Blue, MD      . apixaban Jonah Blue) tablet 5 mg  5 mg Oral BID Everlene Balls N, DO   5 mg at 05/23/19 1149  . atorvastatin (LIPITOR) tablet 20 mg  20 mg Oral Daily 05/25/19,  MD   20 mg at 05/23/19 1149  . donepezil (ARICEPT) tablet 10 mg  10 mg Oral Ivery Quale, MD   10 mg at 05/22/19 2238   And  . memantine (NAMENDA XR) 24 hr capsule 28 mg  28 mg Oral Ivery Quale, MD   28 mg at 05/22/19 2238  . feeding supplement (ENSURE ENLIVE) (ENSURE ENLIVE) liquid 237 mL  237 mL Oral TID BM Hall, Carole N, DO   237 mL at 05/23/19 1050  . furosemide (LASIX) tablet 40 mg  40 mg Oral Daily Irene Pap N, DO   40 mg at 05/23/19 1149  . hydrALAZINE (APRESOLINE) injection 5 mg  5 mg Intravenous Q4H PRN Irene Pap N, DO    5 mg at 05/22/19 0543  . hydrALAZINE (APRESOLINE) tablet 100 mg  100 mg Oral Q8H Hall, Carole N, DO   100 mg at 05/23/19 0548  . levothyroxine (SYNTHROID) tablet 12.5 mcg  12.5 mcg Oral QAC breakfast Karmen Bongo, MD   12.5 mcg at 05/23/19 0548  . losartan (COZAAR) tablet 100 mg  100 mg Oral Daily Irene Pap N, DO   100 mg at 05/23/19 1149  . metoprolol tartrate (LOPRESSOR) tablet 12.5 mg  12.5 mg Oral BID Irene Pap N, DO   12.5 mg at 05/23/19 1150  . senna-docusate (Senokot-S) tablet 1 tablet  1 tablet Oral QHS PRN Karmen Bongo, MD         Discharge Medications: Please see discharge summary for a list of discharge medications.  Relevant Imaging Results:  Relevant Lab Results:   Additional Information SSN: [id number]---VOZDGUY is needing LTC placement  Pollie Friar, RN

## 2019-05-24 NOTE — Progress Notes (Signed)
Occupational Therapy Treatment Patient Details Name: Wanda Mckenzie MRN: [id number] DOB: 1935/06/24 Today's Date: 05/24/2019    History of present illness  Wanda Mckenzie is a 83 y.o. female with medical history significant of CVA; HTN; HLD; COPD; dementia; and afib presenting with AMS. Admitted for TIA CVA work up/seizure rule out evaluation. CT negative for acute abnormality; pending MRI. On heparin drip for acute left lower extremity DVT.    OT comments  Pt making steady progress towards OT goals this session. Session focus on functional mobility from EOB>recliner and self- feeding. Pt continues to be limited by cognitive deficits requiring MAX cueing to identify items on food tray with pt only able to accurately recall "banana." Pt able to attend to self- feeding task for ~7 minutes. Continue to recommend SNF level therapies at time of DC, will continue to follow acutely per POC.    Follow Up Recommendations  SNF;Supervision/Assistance - 24 hour    Equipment Recommendations  None recommended by OT    Recommendations for Other Services      Precautions / Restrictions Precautions Precautions: Fall Restrictions Weight Bearing Restrictions: No       Mobility Bed Mobility Overal bed mobility: Needs Assistance Bed Mobility: Supine to Sit     Supine to sit: Max assist;+2 for physical assistance;HOB elevated     General bed mobility comments: MAX A to sequence steps, cues for hand placement and overall technique throughout  Transfers Overall transfer level: Needs assistance Equipment used: 2 person hand held assist Transfers: Sit to/from [employer] Sit to Stand: Min assist;+2 safety/equipment Stand pivot transfers: Mod assist;+2 safety/equipment       General transfer comment: Pt able to power into standing with MIN A +2 for safety, cues for hand placement and technique. MOD A +2 to sequence pivotal steps to recliner as pt often gets distracted in  transitions    Balance Overall balance assessment: Needs assistance Sitting-balance support: Feet supported Sitting balance-Leahy Scale: Fair Sitting balance - Comments: supervision for safety   Standing balance support: Bilateral upper extremity supported Standing balance-Leahy Scale: Poor Standing balance comment: reliant on external support                           ADL either performed or assessed with clinical judgement   ADL   Eating/Feeding: Set up;Sitting;Cueing for sequencing;Cueing for safety Eating/Feeding Details (indicate cue type and reason): cues for sequencing of task as pt initially not participating. Pt unable to recall food items other than "that looks like a banana" after max cueing                     Toilet Transfer: +2 for physical assistance;+2 for safety/equipment;Moderate assistance;Ambulation Toilet Transfer Details (indicate cue type and reason): simulated to recliner with MOD A +2. Pt needing cues to sequence steps as pt becomes distracted once standing         Functional mobility during ADLs: Moderate assistance;+2 for physical assistance;+2 for safety/equipment;Cueing for sequencing;Cueing for safety General ADL Comments: session focus on functional mobility to recliner and self- feeding in recliner     Vision Baseline Vision/History: No visual deficits Vision Assessment?: Vision impaired- to be further tested in functional context   Perception     Praxis      Cognition Arousal/Alertness: Awake/alert Behavior During Therapy: WFL for tasks assessed/performed Overall Cognitive Status: History of cognitive impairments - at baseline  General Comments: hx of dementia. Pt needing multimodal cues to name food items throughout session. Increased time for processing needing.        Exercises     Shoulder Instructions       General Comments      Pertinent Vitals/ Pain        Pain Assessment: No/denies pain  Home Living                                          Prior Functioning/Environment              Frequency  Min 2X/week        Progress Toward Goals  OT Goals(current goals can now be found in the care plan section)  Progress towards OT goals: Progressing toward goals  Acute Rehab OT Goals Patient Stated Goal: unable OT Goal Formulation: Patient unable to participate in goal setting Time For Goal Achievement: 05/29/19 Potential to Achieve Goals: Good  Plan Discharge plan remains appropriate;Frequency remains appropriate    Co-evaluation    PT/OT/SLP Co-Evaluation/Treatment: Yes Reason for Co-Treatment: Complexity of the patient's impairments (multi-system involvement);Necessary to address cognition/behavior during functional activity;For patient/therapist safety;To address functional/ADL transfers   OT goals addressed during session: ADL's and self-care      AM-PAC OT "6 Clicks" Daily Activity     Outcome Measure   Help from another person eating meals?: A Little Help from another person taking care of personal grooming?: A Little Help from another person toileting, which includes using toliet, bedpan, or urinal?: A Lot Help from another person bathing (including washing, rinsing, drying)?: A Lot Help from another person to put on and taking off regular upper body clothing?: A Lot Help from another person to put on and taking off regular lower body clothing?: A Lot 6 Click Score: 14    End of Session Equipment Utilized During Treatment: Gait belt  OT Visit Diagnosis: Unsteadiness on feet (R26.81);Muscle weakness (generalized) (M62.81);Other symptoms and signs involving cognitive function   Activity Tolerance Patient tolerated treatment well   Patient Left in chair;with call bell/phone within reach;with chair alarm set   Nurse Communication Mobility status        Time: 6283-1517 OT Time Calculation  (min): 25 min  Charges: OT General Charges $OT Visit: 1 Visit OT Treatments $Self Care/Home Management : 8-22 mins  [facility]ier Clam., COTA/L Acute Rehabilitation Services [phone]   Ihor Gully 05/24/2019, 11:12 AM

## 2019-05-24 NOTE — Progress Notes (Signed)
Physical Therapy Treatment Patient Details Name: Wanda Mckenzie MRN: [id number] DOB: February 16, 1936 Today's Date: 05/24/2019    History of Present Illness  Wanda Mckenzie is a 83 y.o. female with medical history significant of CVA; HTN; HLD; COPD; dementia; and afib presenting with AMS. Admitted for TIA CVA work up/seizure rule out evaluation. CT negative for acute abnormality; pending MRI. On heparin drip for acute left lower extremity DVT.     PT Comments    Pt able to stand today and take pivot steps to chair with mod A +2 (hands held). Pt gets very confused during tasks and needs max instructional cues to sequence and continue. PT will continue to follow.    Follow Up Recommendations  SNF;Supervision/Assistance - 24 hour     Equipment Recommendations  None recommended by PT    Recommendations for Other Services       Precautions / Restrictions Precautions Precautions: Fall Restrictions Weight Bearing Restrictions: No    Mobility  Bed Mobility Overal bed mobility: Needs Assistance Bed Mobility: Supine to Sit     Supine to sit: Max assist;+2 for physical assistance;HOB elevated     General bed mobility comments: Max A to sequence steps, cues for hand placement and overall technique throughout. Pt able to move LE's to EOB but needed manual facilitation to continue with task  Transfers Overall transfer level: Needs assistance Equipment used: 2 person hand held assist Transfers: Sit to/from [employer] Transfers Sit to Stand: Min assist;+2 safety/equipment Stand pivot transfers: Mod assist;+2 safety/equipment       General transfer comment: Pt able to power into standing with MIN A +2 for safety, cues for hand placement and technique. MOD A +2 to sequence pivotal steps to recliner as pt often gets distracted in transitions and stops mobilizing.   Ambulation/Gait Ambulation/Gait assistance: Mod assist;+2 safety/equipment Gait Distance (Feet): 3 Feet Assistive  device: 2 person hand held assist Gait Pattern/deviations: Step-to pattern     General Gait Details: pivot steps and bkwd steps to recliner, uncoordinated movement, pt needed very specific instructions to appropriately get to chair   Stairs             Wheelchair Mobility    Modified Rankin (Stroke Patients Only) Modified Rankin (Stroke Patients Only) Pre-Morbid Rankin Score: Moderately severe disability Modified Rankin: Severe disability     Balance Overall balance assessment: Needs assistance Sitting-balance support: Feet supported Sitting balance-Leahy Scale: Fair Sitting balance - Comments: supervision for safety   Standing balance support: Bilateral upper extremity supported Standing balance-Leahy Scale: Poor Standing balance comment: reliant on external support                            Cognition Arousal/Alertness: Awake/alert Behavior During Therapy: WFL for tasks assessed/performed Overall Cognitive Status: History of cognitive impairments - at baseline                                 General Comments: hx of dementia. Pt's attention very limited, stops tasks midway and seems to have forgotten what she was doing.  Pt needing multimodal cues to name food items throughout session.       Exercises General Exercises - Lower Extremity Ankle Circles/Pumps: AROM;Both;10 reps;Seated Hip Flexion/Marching: AROM;Both;10 reps;Seated    General Comments        Pertinent Vitals/Pain Pain Assessment: Faces Faces Pain Scale: No hurt  Home Living                      Prior Function            PT Goals (current goals can now be found in the care plan section) Acute Rehab PT Goals Patient Stated Goal: pt wants to eat her breakfast PT Goal Formulation: Patient unable to participate in goal setting Time For Goal Achievement: 05/29/19 Potential to Achieve Goals: Fair Progress towards PT goals: Progressing toward goals     Frequency    Min 2X/week      PT Plan Current plan remains appropriate    Co-evaluation PT/OT/SLP Co-Evaluation/Treatment: Yes Reason for Co-Treatment: Complexity of the patient's impairments (multi-system involvement);Necessary to address cognition/behavior during functional activity PT goals addressed during session: Mobility/safety with mobility;Balance OT goals addressed during session: ADL's and self-care      AM-PAC PT "6 Clicks" Mobility   Outcome Measure  Help needed turning from your back to your side while in a flat bed without using bedrails?: A Lot Help needed moving from lying on your back to sitting on the side of a flat bed without using bedrails?: A Lot Help needed moving to and from a bed to a chair (including a wheelchair)?: A Lot Help needed standing up from a chair using your arms (e.g., wheelchair or bedside chair)?: A Lot Help needed to walk in hospital room?: A Lot Help needed climbing 3-5 steps with a railing? : Total 6 Click Score: 11    End of Session Equipment Utilized During Treatment: Gait belt Activity Tolerance: Patient tolerated treatment well Patient left: in chair;with call bell/phone within reach;with chair alarm set Nurse Communication: Mobility status PT Visit Diagnosis: Muscle weakness (generalized) (M62.81);Other abnormalities of gait and mobility (R26.89)     Time: 7026-3785 PT Time Calculation (min) (ACUTE ONLY): 25 min  Charges:  $Gait Training: 8-22 mins                     [place]  Pager [phone] [place] 05/24/2019, 1:20 PM

## 2019-05-24 NOTE — TOC Progression Note (Signed)
Transition of Care [facility]) - Progression Note    Patient Details  Name: Wanda Mckenzie MRN: [id number] Date of Birth: 05/06/1936  [facility] CM/SW Contact  Pollie Friar, RN Phone Number: 05/24/2019, 3:06 PM  Clinical Narrative:    CM spoke to son and he asked to have [facility] in [place] see if they could offer his mother a LT bed. CM spoke to [facility] and they currently dont have a bed but will place her on the wait list.  Son: Wanda Mckenzie open to other facilities in the [place], [place], [place] area. CM reached out to [facility] and [facility] and [place].  [facility] is going to call Wanda Mckenzie and go over [employer] with him. They do have bed availability. Wanda Mckenzie to update CM in am.    Expected Discharge Plan: [place] Barriers to Discharge: Ship broker, Continued Medical Work up  Expected Discharge Plan and Services Expected Discharge Plan: [place] In-house Referral: Clinical Social Work Discharge Planning Services: NA Post Acute Care Choice: [place] Living arrangements for the past 2 months: Single Family Home Expected Discharge Date: 05/24/19               DME Arranged: N/A DME Agency: NA                   Social Determinants of Health (SDOH) Interventions    Readmission Risk Interventions No flowsheet data found.

## 2019-05-24 NOTE — Progress Notes (Signed)
Progress Note  DEMAYA HARDGE RDE:[id number] DOB: [birth year]  PCP: [name], FNP  Admit date: 05/14/2019 Discharge date: 05/24/2019  Time spent: 35 minutes  Recommendations for Outpatient Follow-up:  1. Follow-up with your PCP 2. Follow-up with your cardiologist 3. Take your medications as prescribed 4. Continue physical therapy 5. Fall precautions  Discharge Diagnoses:  Active Hospital Problems   Diagnosis Date Noted   AMS (altered mental status) 05/14/2019   Left leg DVT (HCC) 05/14/2019   Stage 3b chronic kidney disease 05/14/2019   Chronic congestive heart failure (HCC) 01/10/2019   Hyperlipidemia 06/19/2016   Vascular dementia without behavioral disturbance (HCC) 06/08/2014   Essential hypertension    Chronic atrial fibrillation (HCC)    COPD (chronic obstructive pulmonary disease) (HCC)     Resolved Hospital Problems  No resolved problems to display.    Discharge Condition: Stable  Diet recommendation: Resume previous diet  Vitals:   05/24/19 0323 05/24/19 0802  BP: 129/64 (!) 151/72  Pulse: 64 74  Resp: 17 16  Temp: 97.6 F (36.4 C) 97.9 F (36.6 C)  SpO2: 93% 94%    History of present illness:  UDJ:SHFWYO J Nelsonis a 82 y.o.femalewith medical history significant ofCVA; HTN; HLD; COPD; dementia; and afib presenting with AMS.Patient has advanced dementia and is unable to answer questions. I called and discussed the patient's husband. He reports that a caregiver was there when episode happened. Sat and ate a few bites, looked like she was walking to fall out of the chair and so they laid her in the floor and called 911. She did not lose consciousness. She has been refusing her medications some, would not eat or take meds the last few days.    ED Course:Dementia, presenting with AMS, ?history. Slumped over, lost control of bowel/bladder. Back to baseline upon EMS arrival. ?LLE weakness. LLE pain, +DVT despite Coumadin  for afib (INR subtherapeutic). ?CVA/TIA, seizure. Seen by neurology.  No evidence of acute CVA on MRI brain.  EEG did not show seizure activity.   05/24/19: Patient was seen and examined at her bedside this morning.  No acute events overnight.  She has no new complaints.  PT OT assessed and recommended SNF.  CSW assisting with placement.   Hospital Course:  Principal Problem:   AMS (altered mental status) Active Problems:   COPD (chronic obstructive pulmonary disease) (HCC)   Chronic atrial fibrillation (HCC)   Essential hypertension   Hyperlipidemia   Vascular dementia without behavioral disturbance (HCC)   Chronic congestive heart failure (HCC)   Left leg DVT (HCC)   Stage 3b chronic kidney disease  Resolved acute metabolic encephalopathy in the context of dementia/CVA work-up, ruled out/seizure work-up, EEG negative with no seizure-like movement while inpatient. Presented from home where she was slumped over and was incontinent of bowel and bladder Differential diagnosis includes seizure versus TIA/stroke versus syncope versus transient alteration of consciousness in the context of advanced dementia. MRI brain without contrast MRI neck without contrast unremarkable for any acute findings.  No sign of acute CVA.  No large vessel stenosis or occlusion. Presented with subtherapeutic INR, 1.2 in the setting of chronic A. fib on Coumadin with medication noncompliance. 2D echo done on 05/14/2019 >>LVEF 40-45% with diffused global hypokinesis.  EEG no evidence of seizure activity.  Mild generalized slowing. Seen by neurology. Follow up with neurology outpatient.  Resolved post repletion: Hypokalemia Potassium 3.6 on 05/23/2019.  AKI on CKD2 suspect prerenal secondary to intravascular volume depletion Baseline cr 0.9  GFR 59 Possible IV fluid hydration due to chronic systolic CHF. Currently on p.o. Lasix 40 mg daily. Follow-up with your PCP  Newly diagnosed acute left lower  extremity DVT Coumadin held, was on hep drip>> until 05/16/19 Switched to eliquis on 05/16/19, continue  Follow-up with your PCP  Resolved leukocytosis, suspect reactive.   White count jumped to 16,000 on 05/19/2019 then trended down to 7.3 on 05/23/2019. No sign of active infective process. Lactic acid and procalcitonin negative. Currently afebrile with no leukocytosis. Nontoxic-appearing. Has no complaints.  Denies any pain or new symptoms.  Transient uncontrolled hypertension Currently on Hydralazine,100 mg 3 times daily, losartan 100 mg daily, p.o. Lasix 40 mg daily, metoprolol 12.5 mg twice daily. Continue Kcl- po 20 meq daily while on diuretics. Follow-up with your cardiologist  Chronic A. fib on Coumadin Presented with INR 1.2 while on Coumadin Goal INR between 2 and 3 while on Coumadin Coumadin was held Switched to Eliquis on 05/16/2019 Follow-up with your cardiologist  Chronic systolic CHF Euvolemic on exam 2D echo done during this admission showed LVEF 40 to 45% Continue strict I's and O's and daily weight Continue cardiac medications Follow-up with your cardiologist  Resolved isolated elevated bilirubin Alkaline phosphatase,  AST ALT normal T bili peaked at 1.7. T bili 0.9 on 05/20/2019. Follow-up with your PCP  Dementia without behavioral disturbance Pleasant Reorient as needed Continue fall precaution  Hyperlipidemia LDL 85 on 05/16/2019 Continue Lipitor 20 mg daily  Hypothyroidism Continue levothyroxine 12.5 mcg daily  Physical debility PT OT assessment recommended SNF Continue PT OT with assistance and fall precautions Challenging placement.  CSW assisting.    Code Status:DNR- confirmed with family   Consults called:Neurology; PT/OT/ST/SW/Nutrition   Discharge Exam: BP (!) 151/72 (BP Location: Right Arm)    Pulse 74    Temp 97.9 F (36.6 C) (Oral)    Resp 16    Ht 5\' 6"  (1.676 m)    Wt 77.1 kg    SpO2 94%    BMI 27.44 kg/m     General: 83 y.o. year-old female well-developed well-nourished pleasant in no acute distress.  Alert and interactive.    Respiratory: Clear to Auscultation No Wheezes or Rales.  Poor inspiratory effort.  Cardiovascular: Irregular rate and rhythm no rubs or gallops.  Abdomen: Soft nontender bowel sounds present.    Musculoskeletal: Trace lower extremity edema bilaterally.  Psychiatry: Mood is appropriate for condition and setting.  Discharge Instructions You were cared for by a hospitalist during your hospital stay. If you have any questions about your discharge medications or the care you received while you were in the hospital after you are discharged, you can call the unit and asked to speak with the hospitalist on call if the hospitalist that took care of you is not available. Once you are discharged, your primary care physician will handle any further medical issues. Please note that NO REFILLS for any discharge medications will be authorized once you are discharged, as it is imperative that you return to your primary care physician (or establish a relationship with a primary care physician if you do not have one) for your aftercare needs so that they can reassess your need for medications and monitor your lab values.   Allergies as of 05/24/2019      Reactions   Codeine    REACTION: vomiting      Medication List    STOP taking these medications   warfarin 5 MG tablet Commonly known as: COUMADIN  TAKE these medications   acetaminophen 500 MG tablet Commonly known as: TYLENOL Take 1,000 mg by mouth every 6 (six) hours as needed for headache (pain).   AeroChamber Plus inhaler Use as instructed   albuterol 108 (90 Base) MCG/ACT inhaler Commonly known as: VENTOLIN HFA Inhale 2 puffs into the lungs every 6 (six) hours as needed for wheezing or shortness of breath.   apixaban 5 MG Tabs tablet Commonly known as: Eliquis Take 2 tablets (10mg ) twice daily for 7 days, then 1  tablet (5mg ) twice daily   atorvastatin 20 MG tablet Commonly known as: LIPITOR Take 1 tablet (20 mg total) by mouth daily. What changed: when to take this   feeding supplement (ENSURE ENLIVE) Liqd Take 237 mLs by mouth 3 (three) times daily between meals for 7 days.   furosemide 40 MG tablet Commonly known as: LASIX Take 1 tablet (40 mg total) by mouth daily. What changed: when to take this   hydrALAZINE 100 MG tablet Commonly known as: APRESOLINE Take 1 tablet (100 mg total) by mouth every 8 (eight) hours.   levothyroxine 25 MCG tablet Commonly known as: SYNTHROID Take 0.5 tablets (12.5 mcg total) by mouth daily before breakfast.   losartan 100 MG tablet Commonly known as: COZAAR Take 1 tablet (100 mg total) by mouth daily.   metoprolol tartrate 25 MG tablet Commonly known as: LOPRESSOR Take 0.5 tablets (12.5 mg total) by mouth 2 (two) times daily.   Namzaric 28-10 MG Cp24 Generic drug: Memantine HCl-Donepezil HCl Take 1 capsule by mouth at bedtime. What changed: when to take this   potassium chloride SA 20 MEQ tablet Commonly known as: KLOR-CON Take 1 tablet (20 mEq total) by mouth daily. What changed: when to take this            Durable Medical Equipment  (From admission, onward)         Start     Ordered   05/23/19 1334  For home use only DME wheelchair cushion (seat and back)  Once     05/23/19 1334   05/23/19 1332  For home use only DME lightweight manual wheelchair with seat cushion  Once    Comments: Patient suffers from ambulatory dysfunction which impairs their ability to perform daily activities like walking in the home.  A walker will not resolve  issue with performing activities of daily living. A wheelchair will allow patient to safely perform daily activities. Patient is not able to propel themselves in the home using a standard weight wheelchair due to weakness. Patient can self propel in the lightweight wheelchair. Length of need  lifetime. Accessories: elevating leg rests (ELRs), wheel locks, extensions and anti-tippers.   05/23/19 1334   05/23/19 1250  For home use only DME standard manual wheelchair with seat cushion  Once    Comments: Patient suffers from ambulatory dysfunction which impairs their ability to perform daily activities like walking in the home.  A walking aid will not resolve issue with performing activities of daily living. A wheelchair will allow patient to safely perform daily activities. Patient can safely propel the wheelchair in the home or has a caregiver who can provide assistance. Length of need 6 months. Accessories: elevating leg rests, wheel locks, extensions and anti-tippers.   05/23/19 1250         Allergies  Allergen Reactions   Codeine     REACTION: vomiting    Contact information for follow-up providers    Loman Brooklyn, FNP. Call  in 1 day(s).   Specialty: Family Medicine Why: Please call for a post hospital follow-up appointment. Contact information: [address] [place] [address] [phone]        Jonelle Sidle, MD Follow up.   Specialty: Cardiology Why: Please call for a post hospital follow-up appointment. Contact information: [address] [place] [address] [phone]            Contact information for after-discharge care    Destination    [facility] Preferred SNF .   Service: Skilled Nursing Contact information: [address] [place] [address] [phone]                   The results of significant diagnostics from this hospitalization (including imaging, microbiology, ancillary and laboratory) are listed below for reference.    Significant Diagnostic Studies: Dg Chest 2 View  Result Date: 05/14/2019 CLINICAL DATA:  Syncope EXAM: CHEST - 2 VIEW COMPARISON:  04/07/2018 FINDINGS: Stable cardiomediastinal contours. Calcific aortic knob. Unchanged appearance of patchy interstitial  opacity the medial aspect of the right lower lobe and peripheral aspect of the left lower lobe, likely chronic scarring or atelectasis. No new focal airspace consolidation no pleural effusion. No pneumothorax. No acute osseous findings. IMPRESSION: 1. No active cardiopulmonary disease. 2. Stable appearance of patchy interstitial opacity in the right lower lobe and peripheral aspect of the left lower lobe, likely scarring versus atelectasis. Electronically Signed   By: Duanne Guess M.D.   On: 05/14/2019 13:37   Ct Head Wo Contrast  Result Date: 05/14/2019 CLINICAL DATA:  Altered level of consciousness. Bowel incontinence. EXAM: CT HEAD WITHOUT CONTRAST TECHNIQUE: Contiguous axial images were obtained from the base of the skull through the vertex without intravenous contrast. COMPARISON:  11/21/2013 head CT. FINDINGS: Brain: Chronic right frontal and right occipital encephalomalacia. No evidence of parenchymal hemorrhage or extra-axial fluid collection. No mass lesion, mass effect, or midline shift. No CT evidence of acute infarction. Nonspecific moderate subcortical and periventricular white matter hypodensity, most in keeping with chronic small vessel ischemic change. Generalized cerebral volume loss. Cerebral ventricle sizes are concordant with the degree of cerebral volume loss. Vascular: No acute abnormality. Skull: No evidence of calvarial fracture. Sinuses/Orbits: The visualized paranasal sinuses are essentially clear. Other:  The mastoid air cells are unopacified. IMPRESSION: 1. No evidence of acute intracranial abnormality. 2. Chronic right frontal and right occipital encephalomalacia. 3. Generalized cerebral volume loss and moderate chronic small vessel ischemic changes in the cerebral white matter. Electronically Signed   By: Delbert Phenix M.D.   On: 05/14/2019 13:30   Mr Angio Neck Wo Contrast  Result Date: 05/15/2019 CLINICAL DATA:  83 year old female with dementia and atrial fibrillation  presenting with altered mental status. EXAM: MRI HEAD WITHOUT CONTRAST MRA NECK WITHOUT CONTRAST TECHNIQUE: Multiplanar, multiecho pulse sequences of the brain and surrounding structures were obtained without intravenous contrast. Angiographic images of the neck were obtained using MRA technique with and without intravenous contrast. Carotid stenosis measurements (when applicable) are obtained utilizing NASCET criteria, using the distal internal carotid diameter as the denominator. CONTRAST:  None. COMPARISON:  CT head 05/14/2019. Remote MR head 12/03/2005 FINDINGS: MRI HEAD FINDINGS Patient was uncooperative. Some series are degraded by motion or could not can be completed. Brain: No evidence for acute infarction, hemorrhage, mass lesion, hydrocephalus, or extra-axial fluid. Generalized atrophy. Remote RIGHT frontal infarct/hematoma, with chronic blood products layering the encephalomalacia cavity. Also remote RIGHT occipital infarct is noted.  Both these abnormalities have been present since 2007. T2 and FLAIR hyperintensities in the white matter favored to represent chronic microvascular ischemic change. Vascular: Flow voids are maintained. Skull and upper cervical spine: Normal marrow signal. Mild pannus. Sinuses/Orbits: No acute findings. Other: None. MRA NECK FINDINGS Noncontrast examination was performed. Conventional branching of the great vessels from the arch without visible proximal stenosis. Carotid bifurcations demonstrate no flow reducing lesion. Posterior wall plaque at the LEFT carotid bifurcation is suggested, without findings to suggest greater than 50% stenosis. BILATERAL vertebral arteries are patent, RIGHT dominant. IMPRESSION: 1. No acute intracranial findings. Atrophy and small vessel disease. Remote ischemic changes as described. 2. No extracranial or intracranial flow reducing lesion is evident. Electronically Signed   By: Elsie Stain M.D.   On: 05/15/2019 13:58   Mr Brain Wo  Contrast  Result Date: 05/15/2019 CLINICAL DATA:  83 year old female with dementia and atrial fibrillation presenting with altered mental status. EXAM: MRI HEAD WITHOUT CONTRAST MRA NECK WITHOUT CONTRAST TECHNIQUE: Multiplanar, multiecho pulse sequences of the brain and surrounding structures were obtained without intravenous contrast. Angiographic images of the neck were obtained using MRA technique with and without intravenous contrast. Carotid stenosis measurements (when applicable) are obtained utilizing NASCET criteria, using the distal internal carotid diameter as the denominator. CONTRAST:  None. COMPARISON:  CT head 05/14/2019. Remote MR head 12/03/2005 FINDINGS: MRI HEAD FINDINGS Patient was uncooperative. Some series are degraded by motion or could not can be completed. Brain: No evidence for acute infarction, hemorrhage, mass lesion, hydrocephalus, or extra-axial fluid. Generalized atrophy. Remote RIGHT frontal infarct/hematoma, with chronic blood products layering the encephalomalacia cavity. Also remote RIGHT occipital infarct is noted. Both these abnormalities have been present since 2007. T2 and FLAIR hyperintensities in the white matter favored to represent chronic microvascular ischemic change. Vascular: Flow voids are maintained. Skull and upper cervical spine: Normal marrow signal. Mild pannus. Sinuses/Orbits: No acute findings. Other: None. MRA NECK FINDINGS Noncontrast examination was performed. Conventional branching of the great vessels from the arch without visible proximal stenosis. Carotid bifurcations demonstrate no flow reducing lesion. Posterior wall plaque at the LEFT carotid bifurcation is suggested, without findings to suggest greater than 50% stenosis. BILATERAL vertebral arteries are patent, RIGHT dominant. IMPRESSION: 1. No acute intracranial findings. Atrophy and small vessel disease. Remote ischemic changes as described. 2. No extracranial or intracranial flow reducing lesion  is evident. Electronically Signed   By: Elsie Stain M.D.   On: 05/15/2019 13:58   Vas [place] Lower Extremity Venous (dvt) (only Mc & Wl 7a-7p)  Result Date: 05/15/2019  Lower Venous Study Indications: Pain, Swelling, and syncope.  Comparison Study: No prior Performing Technologist: Sherren Kerns RVS  Examination Guidelines: A complete evaluation includes B-mode imaging, spectral Doppler, color Doppler, and power Doppler as needed of all accessible portions of each vessel. Bilateral testing is considered an integral part of a complete examination. Limited examinations for reoccurring indications may be performed as noted.  +-----+---------------+---------+-----------+----------+--------------+  RIGHT Compressibility Phasicity Spontaneity Properties Thrombus Aging  +-----+---------------+---------+-----------+----------+--------------+  CFV   Full            Yes       Yes                                    +-----+---------------+---------+-----------+----------+--------------+   +---------+---------------+---------+-----------+----------+--------------+  LEFT      Compressibility Phasicity Spontaneity Properties Thrombus Aging  +---------+---------------+---------+-----------+----------+--------------+  CFV  Full            Yes       Yes                                    +---------+---------------+---------+-----------+----------+--------------+  SFJ       Full                                                             +---------+---------------+---------+-----------+----------+--------------+  FV Prox   Full                                                             +---------+---------------+---------+-----------+----------+--------------+  FV Mid    Full                                                             +---------+---------------+---------+-----------+----------+--------------+  FV Distal Full                                                              +---------+---------------+---------+-----------+----------+--------------+  PFV       Full                                                             +---------+---------------+---------+-----------+----------+--------------+  POP       Full            Yes       Yes                                    +---------+---------------+---------+-----------+----------+--------------+  PTV       None                                             Acute           +---------+---------------+---------+-----------+----------+--------------+  PERO      None                                             Acute           +---------+---------------+---------+-----------+----------+--------------+     Summary: Right: No evidence of common  femoral vein obstruction. Left: Findings consistent with acute deep vein thrombosis involving the left posterior tibial veins, and left peroneal veins.  *See table(s) above for measurements and observations. Electronically signed by Lemar Livings MD on 05/15/2019 at 10:58:36 AM.    Final     Microbiology: Recent Results (from the past 240 hour(s))  Urine culture     Status: Abnormal   Collection Time: 05/14/19 12:58 PM   Specimen: Urine, Clean Catch  Result Value Ref Range Status   Specimen Description URINE, CLEAN CATCH  Final   Special Requests   Final    NONE Performed at [facility], [address] [place], [place] [address]    Culture MULTIPLE SPECIES PRESENT, SUGGEST RECOLLECTION (A)  Final   Report Status 05/15/2019 FINAL  Final  SARS CORONAVIRUS 2 (TAT 6-24 HRS) Nasopharyngeal Nasopharyngeal Swab     Status: None   Collection Time: 05/14/19  3:35 PM   Specimen: Nasopharyngeal Swab  Result Value Ref Range Status   SARS Coronavirus 2 NEGATIVE NEGATIVE Final    Comment: (NOTE) SARS-CoV-2 target nucleic acids are NOT DETECTED. The SARS-CoV-2 RNA is generally detectable in upper and lower respiratory specimens during the acute phase of infection. Negative results do not  preclude SARS-CoV-2 infection, do not rule out co-infections with other pathogens, and should not be used as the sole basis for treatment or other patient management decisions. Negative results must be combined with clinical observations, patient history, and epidemiological information. The expected result is Negative. Fact Sheet for Patients: HairSlick.no Fact Sheet for Healthcare Providers: quierodirigir.com This test is not yet approved or cleared by the [place] FDA and  has been authorized for detection and/or diagnosis of SARS-CoV-2 by FDA under an Emergency Use Authorization (EUA). This EUA will remain  in effect (meaning this test can be used) for the duration of the COVID-19 declaration under Section 56 4(b)(1) of the Act, 21 U.S.C. section 360bbb-3(b)(1), unless the authorization is terminated or revoked sooner. Performed at [facility], [address], [place], [place] [address]   SARS CORONAVIRUS 2 (TAT 6-24 HRS) Nasopharyngeal Nasopharyngeal Swab     Status: None   Collection Time: 05/18/19  8:53 AM   Specimen: Nasopharyngeal Swab  Result Value Ref Range Status   SARS Coronavirus 2 NEGATIVE NEGATIVE Final    Comment: (NOTE) SARS-CoV-2 target nucleic acids are NOT DETECTED. The SARS-CoV-2 RNA is generally detectable in upper and lower respiratory specimens during the acute phase of infection. Negative results do not preclude SARS-CoV-2 infection, do not rule out co-infections with other pathogens, and should not be used as the sole basis for treatment or other patient management decisions. Negative results must be combined with clinical observations, patient history, and epidemiological information. The expected result is Negative. Fact Sheet for Patients: HairSlick.no Fact Sheet for Healthcare Providers: quierodirigir.com This test is not yet  approved or cleared by the [place] FDA and  has been authorized for detection and/or diagnosis of SARS-CoV-2 by FDA under an Emergency Use Authorization (EUA). This EUA will remain  in effect (meaning this test can be used) for the duration of the COVID-19 declaration under Section 56 4(b)(1) of the Act, 21 U.S.C. section 360bbb-3(b)(1), unless the authorization is terminated or revoked sooner. Performed at [facility], [address] [place], [place] [address]      Labs: Basic Metabolic Panel: Recent Labs  Lab 05/18/19 [phone] 05/19/19 8295 05/20/19 0726 05/21/19 0650 05/23/19 0647  NA 142  143 145 143 147*  K 3.9 3.5 3.2* 3.8 3.6  CL 108 109 109 108 108  CO2 22 23 24 23 27   GLUCOSE 106* 116* 123* 128* 95  BUN 19 29* 33* 30* 38*  CREATININE 1.11* 1.23* 1.20* 1.22* 1.28*  CALCIUM 9.2 9.2 9.1 9.1 9.3  MG  --   --  2.1  --  2.4   Liver Function Tests: Recent Labs  Lab 05/20/19 0726  AST 22  ALT 20  ALKPHOS 69  BILITOT 0.9  PROT 5.8*  ALBUMIN 3.2*   No results for input(s): LIPASE, AMYLASE in the last 168 hours. No results for input(s): AMMONIA in the last 168 hours. CBC: Recent Labs  Lab 05/19/19 0323 05/19/19 0544 05/19/19 1646 05/20/19 0726 05/21/19 0650 05/23/19 0647  WBC 16.9* DUPL SEE [id number] 16.2* 12.0* 12.2* 7.3  NEUTROABS 13.6* PENDING  --  9.1*  --   --   HGB 15.0 DUPL SEE [id number] 15.6* 14.3 14.8 13.7  HCT 45.3 DUPL SEE [id number] 46.5* 42.8 45.1 41.7  MCV 94.0 DUPL SEE [id number] 94.1 93.9 95.6 95.4  PLT 261 DUPL SEE [id number] 249 249 255 312   Cardiac Enzymes: No results for input(s): CKTOTAL, CKMB, CKMBINDEX, TROPONINI in the last 168 hours. BNP: BNP (last 3 results) No results for input(s): BNP in the last 8760 hours.  ProBNP (last 3 results) No results for input(s): PROBNP in the last 8760 hours.  CBG: No results for input(s): GLUCAP in the last 168 hours.     Signed:  Darlin Droparole N Shadie Sweatman, MD [facility] 05/24/2019, 12:20 PM

## 2019-05-25 DIAGNOSIS — E782 Mixed hyperlipidemia: Secondary | ICD-10-CM

## 2019-05-25 NOTE — Progress Notes (Signed)
Progress Note  Wanda Mckenzie:[id number] DOB: 1936/02/28  PCP: Wanda Fudge, FNP  Admit date: 05/14/2019 Discharge date: 05/25/2019  Time spent: 35 minutes  Recommendations for Outpatient Follow-up:  1. Follow-up with your PCP 2. Follow-up with your cardiologist 3. Take your medications as prescribed 4. Continue physical therapy 5. Fall precautions  Discharge Diagnoses:  Active Hospital Problems   Diagnosis Date Noted   AMS (altered mental status) 05/14/2019   Left leg DVT (HCC) 05/14/2019   Stage 3b chronic kidney disease 05/14/2019   Chronic congestive heart failure (HCC) 01/10/2019   Hyperlipidemia 06/19/2016   Vascular dementia without behavioral disturbance (HCC) 06/08/2014   Essential hypertension    Chronic atrial fibrillation (HCC)    COPD (chronic obstructive pulmonary disease) (HCC)     Resolved Hospital Problems  No resolved problems to display.    Discharge Condition: Stable  Diet recommendation: Resume previous diet  Vitals:   05/25/19 0739 05/25/19 1151  BP: (!) 149/59 (!) 138/59  Pulse: 70 63  Resp: 18 18  Temp: 97.9 F (36.6 C) 98.5 F (36.9 C)  SpO2: 95% 97%    History of present illness:  Wanda Mckenzie y.o.femalewith medical history significant ofCVA; HTN; HLD; COPD; dementia; and afib presenting with AMS.Patient has advanced dementia and is unable to answer questions. I called and discussed the patient's husband. He reports that Wanda caregiver was there when episode happened. Sat and ate Wanda few bites, looked like she was walking to fall out of the chair and so they laid her in the floor and called 911. She did not lose consciousness. She has been refusing her medications some, would not eat or take meds the last few days.    ED Course:Dementia, presenting with AMS, ?history. Slumped over, lost control of bowel/bladder. Back to baseline upon EMS arrival. ?LLE weakness. LLE pain, +DVT despite  Coumadin for afib (INR subtherapeutic). ?CVA/TIA, seizure. Seen by neurology.  No evidence of acute CVA on MRI brain.  EEG did not show seizure activity.   05/24/19: Patient was seen and examined at her bedside this morning.  No acute events overnight.  She has no new complaints.  PT OT assessed and recommended SNF.  CSW assisting with placement.     05/25/2019 Patient was seen and examined, remained stable no acute distress pending discharge to SNF No issues overnight.  Hospital Course:  Principal Problem:   AMS (altered mental status) Active Problems:   COPD (chronic obstructive pulmonary disease) (HCC)   Chronic atrial fibrillation (HCC)   Essential hypertension   Hyperlipidemia   Vascular dementia without behavioral disturbance (HCC)   Chronic congestive heart failure (HCC)   Left leg DVT (HCC)   Stage 3b chronic kidney disease  The patient was seen and examined, all current medical problems as listed below are reviewed no changes in medications were made.  All labs were reviewed in detail. Remains hemodynamically stable Ending discharge to SNF bed available, insurance approved Case manager/social worker on board -appreciate assistance with disposition     Resolved acute metabolic encephalopathy in the context of dementia/CVA work-up, ruled out/seizure work-up, EEG negative with no seizure-like movement while inpatient. Presented from home where she was slumped over and was incontinent of bowel and bladder Differential diagnosis includes seizure versus TIA/stroke versus syncope versus transient alteration of consciousness in the context of advanced dementia. MRI brain without contrast MRI neck without contrast unremarkable for any acute findings.  No sign of acute CVA.  No large  vessel stenosis or occlusion. Presented with subtherapeutic INR, 1.2 in the setting of chronic Wanda. fib on Coumadin with medication noncompliance. 2D echo done on 05/14/2019 >>LVEF 40-45% with diffused  global hypokinesis.  EEG no evidence of seizure activity.  Mild generalized slowing. Seen by neurology. Follow up with neurology outpatient.  Resolved post repletion: Hypokalemia Monitoring, stabilized  AKI on CKD2 suspect prerenal secondary to intravascular volume depletion Baseline cr 0.9 GFR 59 Possible IV fluid hydration due to chronic systolic CHF. Currently on p.o. Lasix 40 mg daily. Follow-up with your PCP  Newly diagnosed acute left lower extremity DVT Coumadin held, was on hep drip>> until 05/16/19 Switched to eliquis on 05/16/19, continue  Follow-up with your PCP  Resolved leukocytosis, suspect reactive.   White count jumped to 16,000 on 05/19/2019 then trended down to 7.3 on 05/23/2019. No sign of active infective process. Lactic acid and procalcitonin negative. Currently afebrile with no leukocytosis. Nontoxic-appearing. Has no complaints.  Denies any pain or new symptoms.  Transient uncontrolled hypertension Currently on Hydralazine,100 mg 3 times daily, losartan 100 mg daily, p.o. Lasix 40 mg daily, metoprolol 12.5 mg twice daily. Continue Kcl- po 20 meq daily while on diuretics. Follow-up with your cardiologist  Chronic Wanda. fib on Coumadin Presented with INR 1.2 while on Coumadin Goal INR between 2 and 3 while on Coumadin Coumadin was held Switched to Eliquis on 05/16/2019 Follow-up with your cardiologist  Chronic systolic CHF Euvolemic on exam 2D echo done during this admission showed LVEF 40 to 45% Continue strict I's and O's and daily weight Continue cardiac medications Follow-up with your cardiologist  Resolved isolated elevated bilirubin Alkaline phosphatase,  AST ALT normal T bili peaked at 1.7. T bili 0.9 on 05/20/2019. Follow-up with your PCP  Dementia without behavioral disturbance Pleasant Reorient as needed Continue fall precaution  Hyperlipidemia LDL 85 on 05/16/2019 Continue Lipitor 20 mg daily  Hypothyroidism Continue  levothyroxine 12.5 mcg daily  Physical debility PT OT assessment recommended SNF Continue PT OT with assistance and fall precautions Challenging placement.  CSW assisting.    Code Status:DNR- confirmed with family   Consults called:Neurology; PT/OT/ST/SW/Nutrition   Discharge Exam: BP (!) 138/59 (BP Location: Right Arm)    Pulse 63    Temp 98.5 F (36.9 C) (Oral)    Resp 18    Ht 5\' 6"  (1.676 m)    Wt 76.8 kg    SpO2 97%    BMI 27.33 kg/m   General: Mckenzie y.o. year-old female well-developed well-nourished pleasant in no acute distress.  Alert and interactive.    Respiratory: Clear to Auscultation No Wheezes or Rales.  Poor inspiratory effort.  Cardiovascular: Irregular rate and rhythm no rubs or gallops.  Abdomen: Soft nontender bowel sounds present.    Musculoskeletal: Trace lower extremity edema bilaterally.  Psychiatry: Mood is appropriate for condition and setting.  Discharge Instructions You were cared for by Wanda hospitalist during your hospital stay. If you have any questions about your discharge medications or the care you received while you were in the hospital after you are discharged, you can call the unit and asked to speak with the hospitalist on call if the hospitalist that took care of you is not available. Once you are discharged, your primary care physician will handle any further medical issues. Please note that NO REFILLS for any discharge medications will be authorized once you are discharged, as it is imperative that you return to your primary care physician (or establish Wanda relationship with Wanda primary  care physician if you do not have one) for your aftercare needs so that they can reassess your need for medications and monitor your lab values.   Allergies as of 05/25/2019      Reactions   Codeine    REACTION: vomiting      Medication List    STOP taking these medications   warfarin 5 MG tablet Commonly known as: COUMADIN     TAKE these medications    acetaminophen 500 MG tablet Commonly known as: TYLENOL Take 1,000 mg by mouth every 6 (six) hours as needed for headache (pain).   AeroChamber Plus inhaler Use as instructed   albuterol 108 (90 Base) MCG/ACT inhaler Commonly known as: VENTOLIN HFA Inhale 2 puffs into the lungs every 6 (six) hours as needed for wheezing or shortness of breath.   apixaban 5 MG Tabs tablet Commonly known as: Eliquis Take 2 tablets (10mg ) twice daily for 7 days, then 1 tablet (5mg ) twice daily   atorvastatin 20 MG tablet Commonly known as: LIPITOR Take 1 tablet (20 mg total) by mouth daily. What changed: when to take this   furosemide 40 MG tablet Commonly known as: LASIX Take 1 tablet (40 mg total) by mouth daily. What changed: when to take this   hydrALAZINE 100 MG tablet Commonly known as: APRESOLINE Take 1 tablet (100 mg total) by mouth every 8 (eight) hours.   levothyroxine 25 MCG tablet Commonly known as: SYNTHROID Take 0.5 tablets (12.5 mcg total) by mouth daily before breakfast.   losartan 100 MG tablet Commonly known as: COZAAR Take 1 tablet (100 mg total) by mouth daily.   metoprolol tartrate 25 MG tablet Commonly known as: LOPRESSOR Take 0.5 tablets (12.5 mg total) by mouth 2 (two) times daily.   Namzaric 28-10 MG Cp24 Generic drug: Memantine HCl-Donepezil HCl Take 1 capsule by mouth at bedtime. What changed: when to take this   potassium chloride SA 20 MEQ tablet Commonly known as: KLOR-CON Take 1 tablet (20 mEq total) by mouth daily. What changed: when to take this     ASK your doctor about these medications   feeding supplement (ENSURE ENLIVE) Liqd Take 237 mLs by mouth 3 (three) times daily between meals for 7 days. Ask about: Should I take this medication?            Durable Medical Equipment  (From admission, onward)         Start     Ordered   05/23/19 1334  For home use only DME wheelchair cushion (seat and back)  Once     05/23/19 1334    05/23/19 1332  For home use only DME lightweight manual wheelchair with seat cushion  Once    Comments: Patient suffers from ambulatory dysfunction which impairs their ability to perform daily activities like walking in the home.  Wanda walker will not resolve  issue with performing activities of daily living. Wanda wheelchair will allow patient to safely perform daily activities. Patient is not able to propel themselves in the home using Wanda standard weight wheelchair due to weakness. Patient can self propel in the lightweight wheelchair. Length of need lifetime. Accessories: elevating leg rests (ELRs), wheel locks, extensions and anti-tippers.   05/23/19 1334   05/23/19 1250  For home use only DME standard manual wheelchair with seat cushion  Once    Comments: Patient suffers from ambulatory dysfunction which impairs their ability to perform daily activities like walking in the home.  Wanda walking aid will  not resolve issue with performing activities of daily living. Wanda wheelchair will allow patient to safely perform daily activities. Patient can safely propel the wheelchair in the home or has Wanda caregiver who can provide assistance. Length of need 6 months. Accessories: elevating leg rests, wheel locks, extensions and anti-tippers.   05/23/19 1250         Allergies  Allergen Reactions   Codeine     REACTION: vomiting    Contact information for follow-up providers    Wanda Fudge, FNP. Call in 1 day(s).   Specialty: Family Medicine Why: Please call for Wanda post hospital follow-up appointment. Contact information: [address] [place] [address] [phone]        Jonelle Sidle, MD Follow up.   Specialty: Cardiology Why: Please call for Wanda post hospital follow-up appointment. Contact information: [address] [place] [address] [phone]            Contact information for after-discharge care    Destination    [facility] Preferred SNF .   Service:  Skilled Nursing Contact information: [address] [place] [address] [phone]                   The results of significant diagnostics from this hospitalization (including imaging, microbiology, ancillary and laboratory) are listed below for reference.    Significant Diagnostic Studies: Dg Chest 2 View  Result Date: 05/14/2019 CLINICAL DATA:  Syncope EXAM: CHEST - 2 VIEW COMPARISON:  04/07/2018 FINDINGS: Stable cardiomediastinal contours. Calcific aortic knob. Unchanged appearance of patchy interstitial opacity the medial aspect of the right lower lobe and peripheral aspect of the left lower lobe, likely chronic scarring or atelectasis. No new focal airspace consolidation no pleural effusion. No pneumothorax. No acute osseous findings. IMPRESSION: 1. No active cardiopulmonary disease. 2. Stable appearance of patchy interstitial opacity in the right lower lobe and peripheral aspect of the left lower lobe, likely scarring versus atelectasis. Electronically Signed   By: Duanne Guess M.D.   On: 05/14/2019 13:37   Ct Head Wo Contrast  Result Date: 05/14/2019 CLINICAL DATA:  Altered level of consciousness. Bowel incontinence. EXAM: CT HEAD WITHOUT CONTRAST TECHNIQUE: Contiguous axial images were obtained from the base of the skull through the vertex without intravenous contrast. COMPARISON:  11/21/2013 head CT. FINDINGS: Brain: Chronic right frontal and right occipital encephalomalacia. No evidence of parenchymal hemorrhage or extra-axial fluid collection. No mass lesion, mass effect, or midline shift. No CT evidence of acute infarction. Nonspecific moderate subcortical and periventricular white matter hypodensity, most in keeping with chronic small vessel ischemic change. Generalized cerebral volume loss. Cerebral ventricle sizes are concordant with the degree of cerebral volume loss. Vascular: No acute abnormality. Skull: No evidence of calvarial fracture.  Sinuses/Orbits: The visualized paranasal sinuses are essentially clear. Other:  The mastoid air cells are unopacified. IMPRESSION: 1. No evidence of acute intracranial abnormality. 2. Chronic right frontal and right occipital encephalomalacia. 3. Generalized cerebral volume loss and moderate chronic small vessel ischemic changes in the cerebral white matter. Electronically Signed   By: Delbert Phenix M.D.   On: 05/14/2019 13:30   Mr Angio Neck Wo Contrast  Result Date: 05/15/2019 CLINICAL DATA:  Mckenzie year old female with dementia and atrial fibrillation presenting with altered mental status. EXAM: MRI HEAD WITHOUT CONTRAST MRA NECK WITHOUT CONTRAST TECHNIQUE: Multiplanar, multiecho pulse sequences of the brain and surrounding structures were obtained without intravenous contrast. Angiographic images of the neck were obtained using  MRA technique with and without intravenous contrast. Carotid stenosis measurements (when applicable) are obtained utilizing NASCET criteria, using the distal internal carotid diameter as the denominator. CONTRAST:  None. COMPARISON:  CT head 05/14/2019. Remote MR head 12/03/2005 FINDINGS: MRI HEAD FINDINGS Patient was uncooperative. Some series are degraded by motion or could not can be completed. Brain: No evidence for acute infarction, hemorrhage, mass lesion, hydrocephalus, or extra-axial fluid. Generalized atrophy. Remote RIGHT frontal infarct/hematoma, with chronic blood products layering the encephalomalacia cavity. Also remote RIGHT occipital infarct is noted. Both these abnormalities have been present since 2007. T2 and FLAIR hyperintensities in the white matter favored to represent chronic microvascular ischemic change. Vascular: Flow voids are maintained. Skull and upper cervical spine: Normal marrow signal. Mild pannus. Sinuses/Orbits: No acute findings. Other: None. MRA NECK FINDINGS Noncontrast examination was performed. Conventional branching of the great vessels from the  arch without visible proximal stenosis. Carotid bifurcations demonstrate no flow reducing lesion. Posterior wall plaque at the LEFT carotid bifurcation is suggested, without findings to suggest greater than 50% stenosis. BILATERAL vertebral arteries are patent, RIGHT dominant. IMPRESSION: 1. No acute intracranial findings. Atrophy and small vessel disease. Remote ischemic changes as described. 2. No extracranial or intracranial flow reducing lesion is evident. Electronically Signed   By: Elsie Stain M.D.   On: 05/15/2019 13:58   Mr Brain Wo Contrast  Result Date: 05/15/2019 CLINICAL DATA:  Mckenzie year old female with dementia and atrial fibrillation presenting with altered mental status. EXAM: MRI HEAD WITHOUT CONTRAST MRA NECK WITHOUT CONTRAST TECHNIQUE: Multiplanar, multiecho pulse sequences of the brain and surrounding structures were obtained without intravenous contrast. Angiographic images of the neck were obtained using MRA technique with and without intravenous contrast. Carotid stenosis measurements (when applicable) are obtained utilizing NASCET criteria, using the distal internal carotid diameter as the denominator. CONTRAST:  None. COMPARISON:  CT head 05/14/2019. Remote MR head 12/03/2005 FINDINGS: MRI HEAD FINDINGS Patient was uncooperative. Some series are degraded by motion or could not can be completed. Brain: No evidence for acute infarction, hemorrhage, mass lesion, hydrocephalus, or extra-axial fluid. Generalized atrophy. Remote RIGHT frontal infarct/hematoma, with chronic blood products layering the encephalomalacia cavity. Also remote RIGHT occipital infarct is noted. Both these abnormalities have been present since 2007. T2 and FLAIR hyperintensities in the white matter favored to represent chronic microvascular ischemic change. Vascular: Flow voids are maintained. Skull and upper cervical spine: Normal marrow signal. Mild pannus. Sinuses/Orbits: No acute findings. Other: None. MRA NECK  FINDINGS Noncontrast examination was performed. Conventional branching of the great vessels from the arch without visible proximal stenosis. Carotid bifurcations demonstrate no flow reducing lesion. Posterior wall plaque at the LEFT carotid bifurcation is suggested, without findings to suggest greater than 50% stenosis. BILATERAL vertebral arteries are patent, RIGHT dominant. IMPRESSION: 1. No acute intracranial findings. Atrophy and small vessel disease. Remote ischemic changes as described. 2. No extracranial or intracranial flow reducing lesion is evident. Electronically Signed   By: Elsie Stain M.D.   On: 05/15/2019 13:58   Vas [place] Lower Extremity Venous (dvt) (only Mc & Wl 7a-7p)  Result Date: 05/15/2019  Lower Venous Study Indications: Pain, Swelling, and syncope.  Comparison Study: No prior Performing Technologist: Sherren Kerns RVS  Examination Guidelines: Wanda complete evaluation includes B-mode imaging, spectral Doppler, color Doppler, and power Doppler as needed of all accessible portions of each vessel. Bilateral testing is considered an integral part of Wanda complete examination. Limited examinations for reoccurring indications may be performed as noted.  +-----+---------------+---------+-----------+----------+--------------+  RIGHT  Compressibility Phasicity Spontaneity Properties Thrombus Aging  +-----+---------------+---------+-----------+----------+--------------+  CFV   Full            Yes       Yes                                    +-----+---------------+---------+-----------+----------+--------------+   +---------+---------------+---------+-----------+----------+--------------+  LEFT      Compressibility Phasicity Spontaneity Properties Thrombus Aging  +---------+---------------+---------+-----------+----------+--------------+  CFV       Full            Yes       Yes                                    +---------+---------------+---------+-----------+----------+--------------+  SFJ       Full                                                              +---------+---------------+---------+-----------+----------+--------------+  FV Prox   Full                                                             +---------+---------------+---------+-----------+----------+--------------+  FV Mid    Full                                                             +---------+---------------+---------+-----------+----------+--------------+  FV Distal Full                                                             +---------+---------------+---------+-----------+----------+--------------+  PFV       Full                                                             +---------+---------------+---------+-----------+----------+--------------+  POP       Full            Yes       Yes                                    +---------+---------------+---------+-----------+----------+--------------+  PTV       None  Acute           +---------+---------------+---------+-----------+----------+--------------+  PERO      None                                             Acute           +---------+---------------+---------+-----------+----------+--------------+     Summary: Right: No evidence of common femoral vein obstruction. Left: Findings consistent with acute deep vein thrombosis involving the left posterior tibial veins, and left peroneal veins.  *See table(s) above for measurements and observations. Electronically signed by Lemar Livings MD on 05/15/2019 at 10:58:36 AM.    Final     Microbiology: Recent Results (from the past 240 hour(s))  SARS CORONAVIRUS 2 (TAT 6-24 HRS) Nasopharyngeal Nasopharyngeal Swab     Status: None   Collection Time: 05/18/19  8:53 AM   Specimen: Nasopharyngeal Swab  Result Value Ref Range Status   SARS Coronavirus 2 NEGATIVE NEGATIVE Final    Comment: (NOTE) SARS-CoV-2 target nucleic acids are NOT DETECTED. The SARS-CoV-2 RNA is generally detectable in  upper and lower respiratory specimens during the acute phase of infection. Negative results do not preclude SARS-CoV-2 infection, do not rule out co-infections with other pathogens, and should not be used as the sole basis for treatment or other patient management decisions. Negative results must be combined with clinical observations, patient history, and epidemiological information. The expected result is Negative. Fact Sheet for Patients: HairSlick.no Fact Sheet for Healthcare Providers: quierodirigir.com This test is not yet approved or cleared by the [place] FDA and  has been authorized for detection and/or diagnosis of SARS-CoV-2 by FDA under an Emergency Use Authorization (EUA). This EUA will remain  in effect (meaning this test can be used) for the duration of the COVID-19 declaration under Section 56 4(b)(1) of the Act, 21 U.S.C. section 360bbb-3(b)(1), unless the authorization is terminated or revoked sooner. Performed at [facility], [address] [place], [place] [address]      Labs: Basic Metabolic Panel: Recent Labs  Lab 05/19/19 0323 05/20/19 0726 05/21/19 0650 05/23/19 0647  NA 143 145 143 147*  K 3.5 3.2* 3.8 3.6  CL 109 109 108 108  CO2 GLUCOSE 116* 123* 128* 95  BUN 29* 33* 30* 38*  CREATININE 1.23* 1.20* 1.22* 1.28*  CALCIUM 9.2 9.1 9.1 9.3  MG  --  2.1  --  2.4   Liver Function Tests: Recent Labs  Lab 05/20/19 0726  AST 22  ALT 20  ALKPHOS 69  BILITOT 0.9  PROT 5.8*  ALBUMIN 3.2*   No results for input(s): LIPASE, AMYLASE in the last 168 hours. No results for input(s): AMMONIA in the last 168 hours. CBC: Recent Labs  Lab 05/19/19 0323 05/19/19 0544 05/19/19 1646 05/20/19 0726 05/21/19 0650 05/23/19 0647  WBC 16.9* DUPL SEE [id number] 16.2* 12.0* 12.2* 7.3  NEUTROABS 13.6* PENDING  --  9.1*  --   --   HGB 15.0 DUPL SEE [id number] 15.6* 14.3 14.8 13.7  HCT  45.3 DUPL SEE [id number] 46.5* 42.8 45.1 41.7  MCV 94.0 DUPL SEE [id number] 94.1 93.9 95.6 95.4  PLT 261 DUPL SEE [id number] 249 249 255 312   Cardiac Enzymes: No results for input(s): CKTOTAL, CKMB, CKMBINDEX, TROPONINI in the last 168 hours. BNP: BNP (last 3 results) No results for input(s): BNP  in the last 8760 hours.  ProBNP (last 3 results) No results for input(s): PROBNP in the last 8760 hours.  CBG: No results for input(s): GLUCAP in the last 168 hours.     Signed:  Kendell BaneSeyed Wanda Nichoel Digiulio, MD [facility] 05/25/2019, 12:57 PM

## 2019-05-25 NOTE — Plan of Care (Signed)
Patient progressing, awaiting long term care placement.

## 2019-05-26 LAB — BASIC METABOLIC PANEL
Anion gap: 8 (ref 5–15)
BUN: 53 mg/dL — ABNORMAL HIGH (ref 8–23)
CO2: 32 mmol/L (ref 22–32)
Calcium: 9.4 mg/dL (ref 8.9–10.3)
Chloride: 103 mmol/L (ref 98–111)
Creatinine, Ser: 1.46 mg/dL — ABNORMAL HIGH (ref 0.44–1.00)
GFR calc Af Amer: 39 mL/min — ABNORMAL LOW (ref >60)
GFR calc non Af Amer: 34 mL/min — ABNORMAL LOW (ref >60)
Glucose, Bld: 101 mg/dL — ABNORMAL HIGH (ref 70–99)
Potassium: 3.9 mmol/L (ref 3.5–5.1)
Sodium: 143 mmol/L (ref 135–145)

## 2019-05-26 MED ORDER — FUROSEMIDE 40 MG PO TABS: 40.0000 mg | ORAL_TABLET | Freq: Every day | ORAL | 1 refills | Status: DC

## 2019-05-26 MED ORDER — METOPROLOL TARTRATE 25 MG PO TABS: 25.0000 mg | ORAL_TABLET | Freq: Two times a day (BID) | ORAL | 0 refills | Status: DC

## 2019-05-26 MED ORDER — LOSARTAN POTASSIUM 25 MG PO TABS: 25.0000 mg | ORAL_TABLET | Freq: Every day | ORAL | Status: DC

## 2019-05-26 MED ORDER — APIXABAN 5 MG PO TABS: 5.0000 mg | ORAL_TABLET | Freq: Two times a day (BID) | ORAL | 3 refills | Status: DC

## 2019-05-26 MED ORDER — LOSARTAN POTASSIUM 50 MG PO TABS
50.0000 mg | ORAL_TABLET | Freq: Every day | ORAL | Status: DC
  Administered 2019-05-26: 50 mg via ORAL
  Filled 2019-05-26: qty 1

## 2019-05-26 MED ORDER — METOPROLOL TARTRATE 25 MG PO TABS
25.0000 mg | ORAL_TABLET | Freq: Two times a day (BID) | ORAL | Status: DC
  Administered 2019-05-26: 25 mg via ORAL
  Filled 2019-05-26: qty 1

## 2019-05-26 MED ORDER — FUROSEMIDE 40 MG PO TABS: 40.0000 mg | ORAL_TABLET | Freq: Every day | ORAL | Status: DC

## 2019-05-26 MED ORDER — LOSARTAN POTASSIUM 25 MG PO TABS: 25.0000 mg | ORAL_TABLET | Freq: Every day | ORAL | 1 refills | Status: DC

## 2019-05-26 NOTE — Progress Notes (Signed)
Progress Note  Wanda Mckenzie:[id number] DOB: 1936/03/31  PCP: [name], FNP  Admit date: 05/14/2019 Discharge date: 05/26/2019   Subjective:  The patient was seen and examined this morning stable no acute distress, pleasantly confused.  Nursing staff present at bedside. No issues overnight.   Patient case was discussed extensively the case management.  Wheezing pursue of placement.  In contact with the patient family and multiple SNF facilities.  -No significant changes, minor adjustment to ACE inhibitor's and beta-blockers today.   =======================================================================   History of present illness:  Wanda J Nelsonis a 83 y.o.femalewith medical history significant ofCVA; HTN; HLD; COPD; dementia; and afib presenting with AMS.Patient has advanced dementia and is unable to answer questions. I called and discussed the patient's husband. He reports that a caregiver was there when episode happened. Sat and ate a few bites, looked like she was walking to fall out of the chair and so they laid her in the floor and called 911. She did not lose consciousness. She has been refusing her medications some, would not eat or take meds the last few days.    ED Course:Dementia, presenting with AMS, ?history. Slumped over, lost control of bowel/bladder. Back to baseline upon EMS arrival. ?LLE weakness. LLE pain, +DVT despite Coumadin for afib (INR subtherapeutic). ?CVA/TIA, seizure. Seen by neurology.  No evidence of acute CVA on MRI brain.  EEG did not show seizure activity.   05/24/19: Patient was seen and examined at her bedside this morning.  No acute events overnight.  She has no new complaints.  PT OT assessed and recommended SNF.  CSW assisting with placement.     05/25/2019 Patient was seen and examined, remained stable no acute distress pending discharge to SNF No issues overnight.  05/26/2019: Patient was seen and  examined, remained pleasantly confused. -No significant changes, minor adjustment to ACE inhibitor's and beta-blockers today.  ======================================================================= Hospital Course:  Principal Problem:   AMS (altered mental status) Active Problems:   COPD (chronic obstructive pulmonary disease) (HCC)   Chronic atrial fibrillation (HCC)   Essential hypertension   Hyperlipidemia   Vascular dementia without behavioral disturbance (HCC)   Chronic congestive heart failure (HCC)   Left leg DVT (HCC)   Stage 3b chronic kidney disease  The patient was seen and examined.  Pleasantly confused.  No changes with exception of increasing her beta-blocker, reducing her ACE inhibitor today.   Resolved acute metabolic encephalopathy in the context of dementia/CVA work-up, ruled out/seizure work-up, EEG negative with no seizure-like movement while inpatient. Presented from home where she was slumped over and was incontinent of bowel and bladder Differential diagnosis includes seizure versus TIA/stroke versus syncope versus transient alteration of consciousness in the context of advanced dementia. MRI brain without contrast MRI neck without contrast unremarkable for any acute findings.  No sign of acute CVA.  No large vessel stenosis or occlusion. Presented with subtherapeutic INR, 1.2 in the setting of chronic A. fib on Coumadin with medication noncompliance. 2D echo done on 05/14/2019 >>LVEF 40-45% with diffused global hypokinesis.  EEG no evidence of seizure activity.  Mild generalized slowing. Seen by neurology. Follow up with neurology outpatient.  Resolved post repletion: Hypokalemia Monitoring, stabilized  AKI on CKD2 suspect prerenal secondary to intravascular volume depletion Baseline cr 0.9 GFR 59 Possible IV fluid hydration due to chronic systolic CHF. Currently on p.o. Lasix 40 mg daily. Follow-up with your PCP  Newly diagnosed acute left lower  extremity DVT Coumadin held, was on hep  drip>> until 05/16/19 Switched to eliquis on 05/16/19, continue  Follow-up with your PCP  Resolved leukocytosis, suspect reactive.   White count jumped to 16,000 on 05/19/2019 then trended down to 7.3 on 05/23/2019. No sign of active infective process. Lactic acid and procalcitonin negative. Currently afebrile with no leukocytosis. Nontoxic-appearing. Has no complaints.  Denies any pain or new symptoms.  Transient uncontrolled hypertension Currently on Hydralazine,100 mg 3 times daily, losartan 100 mg daily, p.o. Lasix 40 mg daily, metoprolol 12.5 mg twice daily. Continue Kcl- po 20 meq daily while on diuretics. Follow-up with your cardiologist  Chronic A. fib on Coumadin Presented with INR 1.2 while on Coumadin Goal INR between 2 and 3 while on Coumadin Coumadin was held Switched to Eliquis on 05/16/2019 Follow-up with your cardiologist  Chronic systolic CHF Euvolemic on exam 2D echo done during this admission showed LVEF 40 to 45% Continue strict I's and O's and daily weight Continue cardiac medications Follow-up with your cardiologist  Resolved isolated elevated bilirubin Alkaline phosphatase,  AST ALT normal T bili peaked at 1.7. T bili 0.9 on 05/20/2019. Follow-up with your PCP  Dementia without behavioral disturbance Pleasant Reorient as needed Continue fall precaution  Hyperlipidemia LDL 85 on 05/16/2019 Continue Lipitor 20 mg daily  Hypothyroidism Continue levothyroxine 12.5 mcg daily  Physical debility PT OT assessment recommended SNF Continue PT OT with assistance and fall precautions Challenging placement.  CSW assisting.    Code Status:DNR- confirmed with family   Consults called:Neurology; PT/OT/ST/SW/Nutrition    BP (!) 117/52    Pulse 70    Temp 98.6 F (37 C) (Oral)    Resp 18    Ht 5\' 6"  (1.676 m)    Wt 76.8 kg    SpO2 96%    BMI 27.33 kg/m    Physical Exam  Constitution:  Alert,  cooperative, no distress,  Psychiatric: Alert and oriented x1, pleasantly confused at baseline. HEENT: Normocephalic, PERRL, otherwise with in Normal limits  Chest:Chest symmetric Cardio vascular:  S1/S2, RRR, No murmure, No Rubs or Gallops  pulmonary: Clear to auscultation bilaterally, respirations unlabored, negative wheezes / crackles Abdomen: Soft, non-tender, non-distended, bowel sounds,no masses, no organomegaly Muscular skeletal: Limited exam - in bed, able to move all 4 extremities, Normal strength,  Neuro: CNII-XII intact. , normal motor and sensation, reflexes intact  Extremities: No pitting edema lower extremities, +2 pulses  Skin: Dry, warm to touch, negative for any Rashes, No open wounds Wounds: per nursing documentation      Allergies  Allergen Reactions   Codeine     REACTION: vomiting    Contact information for follow-up providers    Loman Brooklyn, FNP. Call in 1 day(s).   Specialty: Family Medicine Why: Please call for a post hospital follow-up appointment. Contact information: [place] [address] [phone]        Satira Sark, MD Follow up.   Specialty: Cardiology Why: Please call for a post hospital follow-up appointment. Contact information: [place] [address] [phone]            Contact information for after-discharge care    [facility] .   Service: Skilled Nursing Contact information: [address] [place] [phone]                   The results of significant diagnostics from this hospitalization (including imaging, microbiology, ancillary and laboratory) are listed  below for reference.    Significant Diagnostic Studies: Dg Chest 2 View  Result Date: 05/14/2019 CLINICAL DATA:  Syncope EXAM: CHEST - 2 VIEW COMPARISON:  04/07/2018 FINDINGS: Stable cardiomediastinal contours. Calcific aortic knob.  Unchanged appearance of patchy interstitial opacity the medial aspect of the right lower lobe and peripheral aspect of the left lower lobe, likely chronic scarring or atelectasis. No new focal airspace consolidation no pleural effusion. No pneumothorax. No acute osseous findings. IMPRESSION: 1. No active cardiopulmonary disease. 2. Stable appearance of patchy interstitial opacity in the right lower lobe and peripheral aspect of the left lower lobe, likely scarring versus atelectasis. Electronically Signed   By: Duanne Guess M.D.   On: 05/14/2019 13:37   Ct Head Wo Contrast  Result Date: 05/14/2019 CLINICAL DATA:  Altered level of consciousness. Bowel incontinence. EXAM: CT HEAD WITHOUT CONTRAST TECHNIQUE: Contiguous axial images were obtained from the base of the skull through the vertex without intravenous contrast. COMPARISON:  11/21/2013 head CT. FINDINGS: Brain: Chronic right frontal and right occipital encephalomalacia. No evidence of parenchymal hemorrhage or extra-axial fluid collection. No mass lesion, mass effect, or midline shift. No CT evidence of acute infarction. Nonspecific moderate subcortical and periventricular white matter hypodensity, most in keeping with chronic small vessel ischemic change. Generalized cerebral volume loss. Cerebral ventricle sizes are concordant with the degree of cerebral volume loss. Vascular: No acute abnormality. Skull: No evidence of calvarial fracture. Sinuses/Orbits: The visualized paranasal sinuses are essentially clear. Other:  The mastoid air cells are unopacified. IMPRESSION: 1. No evidence of acute intracranial abnormality. 2. Chronic right frontal and right occipital encephalomalacia. 3. Generalized cerebral volume loss and moderate chronic small vessel ischemic changes in the cerebral white matter. Electronically Signed   By: Delbert Phenix M.D.   On: 05/14/2019 13:30   Mr Angio Neck Wo Contrast  Result Date: 05/15/2019 CLINICAL DATA:  83 year old  female with dementia and atrial fibrillation presenting with altered mental status. EXAM: MRI HEAD WITHOUT CONTRAST MRA NECK WITHOUT CONTRAST TECHNIQUE: Multiplanar, multiecho pulse sequences of the brain and surrounding structures were obtained without intravenous contrast. Angiographic images of the neck were obtained using MRA technique with and without intravenous contrast. Carotid stenosis measurements (when applicable) are obtained utilizing NASCET criteria, using the distal internal carotid diameter as the denominator. CONTRAST:  None. COMPARISON:  CT head 05/14/2019. Remote MR head 12/03/2005 FINDINGS: MRI HEAD FINDINGS Patient was uncooperative. Some series are degraded by motion or could not can be completed. Brain: No evidence for acute infarction, hemorrhage, mass lesion, hydrocephalus, or extra-axial fluid. Generalized atrophy. Remote RIGHT frontal infarct/hematoma, with chronic blood products layering the encephalomalacia cavity. Also remote RIGHT occipital infarct is noted. Both these abnormalities have been present since 2007. T2 and FLAIR hyperintensities in the white matter favored to represent chronic microvascular ischemic change. Vascular: Flow voids are maintained. Skull and upper cervical spine: Normal marrow signal. Mild pannus. Sinuses/Orbits: No acute findings. Other: None. MRA NECK FINDINGS Noncontrast examination was performed. Conventional branching of the great vessels from the arch without visible proximal stenosis. Carotid bifurcations demonstrate no flow reducing lesion. Posterior wall plaque at the LEFT carotid bifurcation is suggested, without findings to suggest greater than 50% stenosis. BILATERAL vertebral arteries are patent, RIGHT dominant. IMPRESSION: 1. No acute intracranial findings. Atrophy and small vessel disease. Remote ischemic changes as described. 2. No extracranial or intracranial flow reducing lesion is evident. Electronically Signed   By: Elsie Stain M.D.   On:  05/15/2019 13:58   Mr Brain Wanda Mckenzie  Contrast  Result Date: 05/15/2019 CLINICAL DATA:  83 year old female with dementia and atrial fibrillation presenting with altered mental status. EXAM: MRI HEAD WITHOUT CONTRAST MRA NECK WITHOUT CONTRAST TECHNIQUE: Multiplanar, multiecho pulse sequences of the brain and surrounding structures were obtained without intravenous contrast. Angiographic images of the neck were obtained using MRA technique with and without intravenous contrast. Carotid stenosis measurements (when applicable) are obtained utilizing NASCET criteria, using the distal internal carotid diameter as the denominator. CONTRAST:  None. COMPARISON:  CT head 05/14/2019. Remote MR head 12/03/2005 FINDINGS: MRI HEAD FINDINGS Patient was uncooperative. Some series are degraded by motion or could not can be completed. Brain: No evidence for acute infarction, hemorrhage, mass lesion, hydrocephalus, or extra-axial fluid. Generalized atrophy. Remote RIGHT frontal infarct/hematoma, with chronic blood products layering the encephalomalacia cavity. Also remote RIGHT occipital infarct is noted. Both these abnormalities have been present since 2007. T2 and FLAIR hyperintensities in the white matter favored to represent chronic microvascular ischemic change. Vascular: Flow voids are maintained. Skull and upper cervical spine: Normal marrow signal. Mild pannus. Sinuses/Orbits: No acute findings. Other: None. MRA NECK FINDINGS Noncontrast examination was performed. Conventional branching of the great vessels from the arch without visible proximal stenosis. Carotid bifurcations demonstrate no flow reducing lesion. Posterior wall plaque at the LEFT carotid bifurcation is suggested, without findings to suggest greater than 50% stenosis. BILATERAL vertebral arteries are patent, RIGHT dominant. IMPRESSION: 1. No acute intracranial findings. Atrophy and small vessel disease. Remote ischemic changes as described. 2. No extracranial or  intracranial flow reducing lesion is evident. Electronically Signed   By: Elsie Stain M.D.   On: 05/15/2019 13:58   Vas [place] Lower Extremity Venous (dvt) (only Mc & Wl 7a-7p)  Result Date: 05/15/2019  Lower Venous Study Indications: Pain, Swelling, and syncope.  Comparison Study: No prior Performing Technologist: Sherren Kerns RVS  Examination Guidelines: A complete evaluation includes B-mode imaging, spectral Doppler, color Doppler, and power Doppler as needed of all accessible portions of each vessel. Bilateral testing is considered an integral part of a complete examination. Limited examinations for reoccurring indications may be performed as noted.  +-----+---------------+---------+-----------+----------+--------------+  RIGHT Compressibility Phasicity Spontaneity Properties Thrombus Aging  +-----+---------------+---------+-----------+----------+--------------+  CFV   Full            Yes       Yes                                    +-----+---------------+---------+-----------+----------+--------------+   +---------+---------------+---------+-----------+----------+--------------+  LEFT      Compressibility Phasicity Spontaneity Properties Thrombus Aging  +---------+---------------+---------+-----------+----------+--------------+  CFV       Full            Yes       Yes                                    +---------+---------------+---------+-----------+----------+--------------+  SFJ       Full                                                             +---------+---------------+---------+-----------+----------+--------------+  FV Prox   Full                                                             +---------+---------------+---------+-----------+----------+--------------+  FV Mid    Full                                                             +---------+---------------+---------+-----------+----------+--------------+  FV Distal Full                                                              +---------+---------------+---------+-----------+----------+--------------+  PFV       Full                                                             +---------+---------------+---------+-----------+----------+--------------+  POP       Full            Yes       Yes                                    +---------+---------------+---------+-----------+----------+--------------+  PTV       None                                             Acute           +---------+---------------+---------+-----------+----------+--------------+  PERO      None                                             Acute           +---------+---------------+---------+-----------+----------+--------------+     Summary: Right: No evidence of common femoral vein obstruction. Left: Findings consistent with acute deep vein thrombosis involving the left posterior tibial veins, and left peroneal veins.  *See table(s) above for measurements and observations. Electronically signed by Lemar LivingsBrandon Cain MD on 05/15/2019 at 10:58:36 AM.    Final     Microbiology: Recent Results (from the past 240 hour(s))  SARS CORONAVIRUS 2 (TAT 6-24 HRS) Nasopharyngeal Nasopharyngeal Swab     Status: None   Collection Time: 05/18/19  8:53 AM   Specimen: Nasopharyngeal Swab  Result Value Ref Range Status   SARS Coronavirus 2 NEGATIVE NEGATIVE Final    Comment: (NOTE) SARS-CoV-2 target nucleic acids are NOT DETECTED. The SARS-CoV-2 RNA is generally detectable in upper and lower respiratory specimens during the acute phase of infection. Negative results do not preclude SARS-CoV-2 infection, do not rule out co-infections with other pathogens, and should not be used as the sole basis for treatment or other patient management decisions. Negative results must be combined with clinical observations, patient history, and epidemiological information. The expected result is Negative. Fact Sheet for Patients: HairSlick.nohttps://www.fda.gov/media/138098/download Fact Sheet for  Healthcare Providers: quierodirigir.comhttps://www.fda.gov/media/138095/download This test is not  yet approved or cleared by the [place] FDA and  has been authorized for detection and/or diagnosis of SARS-CoV-2 by FDA under an Emergency Use Authorization (EUA). This EUA will remain  in effect (meaning this test can be used) for the duration of the COVID-19 declaration under Section 56 4(b)(1) of the Act, 21 U.S.C. section 360bbb-3(b)(1), unless the authorization is terminated or revoked sooner. Performed at [facility], [address] [place], [place] [id number]      Labs: Basic Metabolic Panel: Recent Labs  Lab 05/20/19 0726 05/21/19 0650 05/23/19 0647 05/26/19 0855  NA 145 143 147* 143  K 3.2* 3.8 3.6 3.9  CL 109 108 108 103  CO2 24 23 27  32  GLUCOSE 123* 128* 95 101*  BUN 33* 30* 38* 53*  CREATININE 1.20* 1.22* 1.28* 1.46*  CALCIUM 9.1 9.1 9.3 9.4  MG 2.1  --  2.4  --    Liver Function Tests: Recent Labs  Lab 05/20/19 0726  AST 22  ALT 20  ALKPHOS 69  BILITOT 0.9  PROT 5.8*  ALBUMIN 3.2*   No results for input(s): LIPASE, AMYLASE in the last 168 hours. No results for input(s): AMMONIA in the last 168 hours. CBC: Recent Labs  Lab 05/19/19 1646 05/20/19 0726 05/21/19 0650 05/23/19 0647  WBC 16.2* 12.0* 12.2* 7.3  NEUTROABS  --  9.1*  --   --   HGB 15.6* 14.3 14.8 13.7  HCT 46.5* 42.8 45.1 41.7  MCV 94.1 93.9 95.6 95.4  PLT 249 249 255 312    Signed:  Kendell BaneSeyed A Braidyn Peace, MD [facility] 05/26/2019, 2:50 PM

## 2019-05-26 NOTE — TOC Transition Note (Signed)
Transition of Care [facility]) - CM/SW Discharge Note   Patient Details  Name: Wanda Mckenzie MRN: [id number] Date of Birth: 08/01/1935  [facility] CM/SW Contact:  Pollie Friar, RN Phone Number: 05/26/2019, 3:07 PM   Clinical Narrative:    Family not happy with LTC choices and have decided for patient to d/c home.  CM has discussed with POA: Eddie Dibbles that family other than the spouse will need to be with the patient 24/7. Eddie Dibbles states brother: Juanda Crumble, niece and nieces mother will provide the needed supervision. CM arranged [facility] through [facility]. Cory with [facility] accepted the referral. They are aware someone besides spouse is suppose to be in the home at all times per APS.  CM called APS and was unable to speak to [facility] her APS worker but did speak to Pauletta Browns a supervisor and informed her of the plan. She was in agreement with d/c and will have pt followed in the home. She is also aware of [place] services and that they will  check on pt also.  Son Juanda Crumble) to provide transportation home today.  Wheelchair for home is at the bedside.    Final next level of care: [place] Barriers to Discharge: No Barriers Identified   Patient Goals and CMS Choice Patient states their goals for this hospitalization and ongoing recovery are:: Pt family wants SNF CMS Medicare.gov Compare Post Acute Care list provided to:: Patient Represenative (must comment) Choice offered to / list presented to : Adult Children(son)  Discharge Placement                       Discharge Plan and Services In-house Referral: Clinical Social Work Discharge Planning Services: NA Post Acute Care Choice: Skilled Nursing Facility          DME Arranged: Wheelchair manual DME Agency: [employer]       HH Arranged: PT, OT, Nurse's Aide, Social Work [employer] Agency: [place] Date [facility] Agency Contacted: 05/26/19   Representative spoke with at [place]: cory  Social Determinants of  Health ([place]) Interventions     Readmission Risk Interventions No flowsheet data found.

## 2019-05-26 NOTE — Discharge Summary (Signed)
Progress Note  Wanda Mckenzie:[id number] DOB: 1935/11/16  PCP: Wanda Fudge, FNP  Admit date: 05/14/2019 Discharge date: 05/26/2019  Time spent: 55 minutes  Recommendations for Outpatient Follow-up:  1. Follow-up with your PCP 2. Follow-up with your cardiologist 3. Take your medications as prescribed 4. Continue physical therapy 5. Fall precautions  Discharge Diagnoses:  Active Hospital Problems   Diagnosis Date Noted   AMS (altered mental status) 05/14/2019   Left leg DVT (HCC) 05/14/2019   Stage 3b chronic kidney disease 05/14/2019   Chronic congestive heart failure (HCC) 01/10/2019   Hyperlipidemia 06/19/2016   Vascular dementia without behavioral disturbance (HCC) 06/08/2014   Essential hypertension    Chronic atrial fibrillation (HCC)    COPD (chronic obstructive pulmonary disease) (HCC)     Resolved Hospital Problems  No resolved problems to display.    Discharge Condition: Stable  Diet recommendation: Resume previous diet  Vitals:   05/26/19 1145 05/26/19 1317  BP: 128/64 (!) 117/52  Pulse: 70   Resp: 18   Temp: 98.6 F (37 C)   SpO2: 96%     History of present illness:  Wanda J Nelsonis a 83 y.o.femalewith medical history significant ofCVA; HTN; HLD; COPD; dementia; and afib presenting with AMS.Patient has advanced dementia and is unable to answer questions. I called and discussed the patient's husband. He reports that a caregiver was there when episode happened. Sat and ate a few bites, looked like she was walking to fall out of the chair and so they laid her in the floor and called 911. She did not lose consciousness. She has been refusing her medications some, would not eat or take meds the last few days.    ED Course:Dementia, presenting with AMS, ?history. Slumped over, lost control of bowel/bladder. Back to baseline upon EMS arrival. ?LLE weakness. LLE pain, +DVT despite Coumadin for afib (INR  subtherapeutic). ?CVA/TIA, seizure. Seen by neurology.  No evidence of acute CVA on MRI brain.  EEG did not show seizure activity.   05/24/19: Patient was seen and examined at her bedside this morning.  No acute events overnight.  She has no new complaints.  PT OT assessed and recommended SNF.  CSW assisting with placement.     05/25/2019 Patient was seen and examined, remained stable no acute distress pending discharge to SNF No issues overnight.   05/26/2019: Patient was seen and examined, remained pleasantly confused. -No significant changes, minor adjustment to ACE inhibitor's and beta-blockers today.     Hospital Course:  Principal Problem:   AMS (altered mental status) Active Problems:   COPD (chronic obstructive pulmonary disease) (HCC)   Chronic atrial fibrillation (HCC)   Essential hypertension   Hyperlipidemia   Vascular dementia without behavioral disturbance (HCC)   Chronic congestive heart failure (HCC)   Left leg DVT (HCC)   Stage 3b chronic kidney disease   Resolved acute metabolic encephalopathy in the context of dementia/CVA work-up, ruled out/seizure work-up, EEG negative with no seizure-like movement while inpatient. Presented from home where she was slumped over and was incontinent of bowel and bladder Differential diagnosis includes seizure versus TIA/stroke versus syncope versus transient alteration of consciousness in the context of advanced dementia. MRI brain without contrast MRI neck without contrast unremarkable for any acute findings.  No sign of acute CVA.  No large vessel stenosis or occlusion. Presented with subtherapeutic INR, 1.2 in the setting of chronic A. fib on Coumadin with medication noncompliance. 2D echo done on 05/14/2019 >>LVEF 40-45% with diffused  global hypokinesis.  EEG no evidence of seizure activity.  Mild generalized slowing. Seen by neurology. Follow up with neurology outpatient.  Resolved post repletion:  Hypokalemia Monitoring, stabilized  AKI on CKD2 suspect prerenal secondary to intravascular volume depletion Baseline cr 0.9 GFR 59 Possible IV fluid hydration due to chronic systolic CHF. Currently on p.o. Lasix 40 mg daily. Follow-up with your PCP  Newly diagnosed acute left lower extremity DVT Coumadin held, was on hep drip>> until 05/16/19 Switched to eliquis on 05/16/19, continue  Follow-up with your PCP  Resolved leukocytosis, suspect reactive.   White count jumped to 16,000 on 05/19/2019 then trended down to 7.3 on 05/23/2019. No sign of active infective process. Lactic acid and procalcitonin negative. Currently afebrile with no leukocytosis. Nontoxic-appearing. Has no complaints.  Denies any pain or new symptoms.  Transient uncontrolled hypertension Currently on Hydralazine,100 mg 3 times daily, losartan 100 mg daily, p.o. Lasix 40 mg daily, metoprolol 12.5 mg twice daily. Continue Kcl- po 20 meq daily while on diuretics. Follow-up with your cardiologist  Chronic A. fib on Coumadin Presented with INR 1.2 while on Coumadin Goal INR between 2 and 3 while on Coumadin Coumadin was held Switched to Eliquis on 05/16/2019 Follow-up with your cardiologist  Chronic systolic CHF Euvolemic on exam 2D echo done during this admission showed LVEF 40 to 45% Continue strict I's and O's and daily weight Continue cardiac medications Follow-up with your cardiologist  Resolved isolated elevated bilirubin Alkaline phosphatase,  AST ALT normal T bili peaked at 1.7. T bili 0.9 on 05/20/2019. Follow-up with your PCP  Dementia without behavioral disturbance Pleasant Reorient as needed Continue fall precaution  Hyperlipidemia LDL 85 on 05/16/2019 Continue Lipitor 20 mg daily  Hypothyroidism Continue levothyroxine 12.5 mcg daily  Physical debility PT OT assessment recommended SNF Continue PT OT with assistance and fall precautions Challenging placement.  CSW  assisting.    Code Status:DNR- confirmed with family   Consults called:Neurology; PT/OT/ST/SW/Nutrition  BP (!) 117/52    Pulse 70    Temp 98.6 F (37 C) (Oral)    Resp 18    Ht  (1.676 m)    Wt 76.8 kg    SpO2 96%    BMI 27.33 kg/m    Physical Exam  Constitution: Pleasantly confused, in no acute distress Psychiatric: Awake alert x1, stable mood, baseline dementia, with confusion HEENT: Normocephalic, PERRL, otherwise with in Normal limits  Chest:Chest symmetric Cardio vascular:  S1/S2, RRR, No murmure, No Rubs or Gallops  pulmonary: Clear to auscultation bilaterally, respirations unlabored, negative wheezes / crackles Abdomen: Soft, non-tender, non-distended, bowel sounds,no masses, no organomegaly Muscular skeletal:  Generalized weaknesses.  Limited exam - in bed, able to move all 4 extremities, Normal strength,  Neuro: CNII-XII intact. , normal motor and sensation, reflexes intact  Extremities: No pitting edema lower extremities, +2 pulses  Skin: Dry, warm to touch, negative for any Rashes, No open wounds Wounds: per nursing documentation        Discharge Instructions You were cared for by a hospitalist during your hospital stay. If you have any questions about your discharge medications or the care you received while you were in the hospital after you are discharged, you can call the unit and asked to speak with the hospitalist on call if the hospitalist that took care of you is not available. Once you are discharged, your primary care physician will handle any further medical issues. Please note that NO REFILLS for any discharge medications will be authorized  once you are discharged, as it is imperative that you return to your primary care physician (or establish a relationship with a primary care physician if you do not have one) for your aftercare needs so that they can reassess your need for medications and monitor your lab values.  Discharge Instructions     Activity as tolerated - No restrictions   Complete by: As directed    Diet - low sodium heart healthy   Complete by: As directed    Discharge instructions   Complete by: As directed    Please continue current medications.  Fall precautions, may ambulate with assist. Continue with PT OT, follow-up with the PCP   Increase activity slowly   Complete by: As directed      Allergies as of 05/26/2019      Reactions   Codeine    REACTION: vomiting      Medication List    STOP taking these medications   warfarin 5 MG tablet Commonly known as: COUMADIN     TAKE these medications   acetaminophen 500 MG tablet Commonly known as: TYLENOL Take 1,000 mg by mouth every 6 (six) hours as needed for headache (pain).   AeroChamber Plus inhaler Use as instructed   albuterol 108 (90 Base) MCG/ACT inhaler Commonly known as: VENTOLIN HFA Inhale 2 puffs into the lungs every 6 (six) hours as needed for wheezing or shortness of breath.   apixaban 5 MG Tabs tablet Commonly known as: ELIQUIS Take 1 tablet (5 mg total) by mouth 2 (two) times daily.   atorvastatin 20 MG tablet Commonly known as: LIPITOR Take 1 tablet (20 mg total) by mouth daily. What changed: when to take this   furosemide 40 MG tablet Commonly known as: LASIX Take 1 tablet (40 mg total) by mouth daily. What changed: when to take this   furosemide 40 MG tablet Commonly known as: LASIX Take 1 tablet (40 mg total) by mouth daily. Start taking on: May 27, 2019 What changed: You were already taking a medication with the same name, and this prescription was added. Make sure you understand how and when to take each.   hydrALAZINE 100 MG tablet Commonly known as: APRESOLINE Take 1 tablet (100 mg total) by mouth every 8 (eight) hours.   levothyroxine 25 MCG tablet Commonly known as: SYNTHROID Take 0.5 tablets (12.5 mcg total) by mouth daily before breakfast.   losartan 100 MG tablet Commonly known as: COZAAR Take 1  tablet (100 mg total) by mouth daily. What changed: Another medication with the same name was added. Make sure you understand how and when to take each.   losartan 25 MG tablet Commonly known as: COZAAR Take 1 tablet (25 mg total) by mouth daily. Start taking on: May 27, 2019 What changed: You were already taking a medication with the same name, and this prescription was added. Make sure you understand how and when to take each.   metoprolol tartrate 25 MG tablet Commonly known as: LOPRESSOR Take 0.5 tablets (12.5 mg total) by mouth 2 (two) times daily. What changed: Another medication with the same name was added. Make sure you understand how and when to take each.   metoprolol tartrate 25 MG tablet Commonly known as: LOPRESSOR Take 1 tablet (25 mg total) by mouth 2 (two) times daily. What changed: You were already taking a medication with the same name, and this prescription was added. Make sure you understand how and when to take each.   Namzaric  28-10 MG Cp24 Generic drug: Memantine HCl-Donepezil HCl Take 1 capsule by mouth at bedtime. What changed: when to take this   potassium chloride SA 20 MEQ tablet Commonly known as: KLOR-CON Take 1 tablet (20 mEq total) by mouth daily. What changed: when to take this     ASK your doctor about these medications   feeding supplement (ENSURE ENLIVE) Liqd Take 237 mLs by mouth 3 (three) times daily between meals for 7 days. Ask about: Should I take this medication?            Durable Medical Equipment  (From admission, onward)         Start     Ordered   05/23/19 1334  For home use only DME wheelchair cushion (seat and back)  Once     05/23/19 1334   05/23/19 1332  For home use only DME lightweight manual wheelchair with seat cushion  Once    Comments: Patient suffers from ambulatory dysfunction which impairs their ability to perform daily activities like walking in the home.  A walker will not resolve  issue with  performing activities of daily living. A wheelchair will allow patient to safely perform daily activities. Patient is not able to propel themselves in the home using a standard weight wheelchair due to weakness. Patient can self propel in the lightweight wheelchair. Length of need lifetime. Accessories: elevating leg rests (ELRs), wheel locks, extensions and anti-tippers.   05/23/19 1334   05/23/19 1250  For home use only DME standard manual wheelchair with seat cushion  Once    Comments: Patient suffers from ambulatory dysfunction which impairs their ability to perform daily activities like walking in the home.  A walking aid will not resolve issue with performing activities of daily living. A wheelchair will allow patient to safely perform daily activities. Patient can safely propel the wheelchair in the home or has a caregiver who can provide assistance. Length of need 6 months. Accessories: elevating leg rests, wheel locks, extensions and anti-tippers.   05/23/19 1250         Allergies  Allergen Reactions   Codeine     REACTION: vomiting    Contact information for follow-up providers    Wanda Fudge, FNP. Call in 1 day(s).   Specialty: Family Medicine Why: Please call for a post hospital follow-up appointment. Contact information: [address] [place] [address] [phone]        Jonelle Sidle, MD Follow up.   Specialty: Cardiology Why: Please call for a post hospital follow-up appointment. Contact information: [address] [place] [address] [phone]            Contact information for after-discharge care    Destination    [facility] Preferred SNF .   Service: Skilled Nursing Contact information: [address] [place] [address] [phone]                   The results of significant diagnostics from this hospitalization (including imaging, microbiology, ancillary and laboratory) are listed below  for reference.    Significant Diagnostic Studies: Dg Chest 2 View  Result Date: 05/14/2019 CLINICAL DATA:  Syncope EXAM: CHEST - 2 VIEW COMPARISON:  04/07/2018 FINDINGS: Stable cardiomediastinal contours. Calcific aortic knob. Unchanged appearance of patchy interstitial opacity the medial aspect of the right lower lobe and peripheral aspect of the left lower lobe, likely chronic scarring or atelectasis. No new focal airspace consolidation no pleural effusion.  No pneumothorax. No acute osseous findings. IMPRESSION: 1. No active cardiopulmonary disease. 2. Stable appearance of patchy interstitial opacity in the right lower lobe and peripheral aspect of the left lower lobe, likely scarring versus atelectasis. Electronically Signed   By: Davina Poke M.D.   On: 05/14/2019 13:37   Ct Head Wo Contrast  Result Date: 05/14/2019 CLINICAL DATA:  Altered level of consciousness. Bowel incontinence. EXAM: CT HEAD WITHOUT CONTRAST TECHNIQUE: Contiguous axial images were obtained from the base of the skull through the vertex without intravenous contrast. COMPARISON:  11/21/2013 head CT. FINDINGS: Brain: Chronic right frontal and right occipital encephalomalacia. No evidence of parenchymal hemorrhage or extra-axial fluid collection. No mass lesion, mass effect, or midline shift. No CT evidence of acute infarction. Nonspecific moderate subcortical and periventricular white matter hypodensity, most in keeping with chronic small vessel ischemic change. Generalized cerebral volume loss. Cerebral ventricle sizes are concordant with the degree of cerebral volume loss. Vascular: No acute abnormality. Skull: No evidence of calvarial fracture. Sinuses/Orbits: The visualized paranasal sinuses are essentially clear. Other:  The mastoid air cells are unopacified. IMPRESSION: 1. No evidence of acute intracranial abnormality. 2. Chronic right frontal and right occipital encephalomalacia. 3. Generalized cerebral volume loss and  moderate chronic small vessel ischemic changes in the cerebral white matter. Electronically Signed   By: Ilona Sorrel M.D.   On: 05/14/2019 13:30   Mr Angio Neck Wo Contrast  Result Date: 05/15/2019 CLINICAL DATA:  83 year old female with dementia and atrial fibrillation presenting with altered mental status. EXAM: MRI HEAD WITHOUT CONTRAST MRA NECK WITHOUT CONTRAST TECHNIQUE: Multiplanar, multiecho pulse sequences of the brain and surrounding structures were obtained without intravenous contrast. Angiographic images of the neck were obtained using MRA technique with and without intravenous contrast. Carotid stenosis measurements (when applicable) are obtained utilizing NASCET criteria, using the distal internal carotid diameter as the denominator. CONTRAST:  None. COMPARISON:  CT head 05/14/2019. Remote MR head 12/03/2005 FINDINGS: MRI HEAD FINDINGS Patient was uncooperative. Some series are degraded by motion or could not can be completed. Brain: No evidence for acute infarction, hemorrhage, mass lesion, hydrocephalus, or extra-axial fluid. Generalized atrophy. Remote RIGHT frontal infarct/hematoma, with chronic blood products layering the encephalomalacia cavity. Also remote RIGHT occipital infarct is noted. Both these abnormalities have been present since 2007. T2 and FLAIR hyperintensities in the white matter favored to represent chronic microvascular ischemic change. Vascular: Flow voids are maintained. Skull and upper cervical spine: Normal marrow signal. Mild pannus. Sinuses/Orbits: No acute findings. Other: None. MRA NECK FINDINGS Noncontrast examination was performed. Conventional branching of the great vessels from the arch without visible proximal stenosis. Carotid bifurcations demonstrate no flow reducing lesion. Posterior wall plaque at the LEFT carotid bifurcation is suggested, without findings to suggest greater than 50% stenosis. BILATERAL vertebral arteries are patent, RIGHT dominant.  IMPRESSION: 1. No acute intracranial findings. Atrophy and small vessel disease. Remote ischemic changes as described. 2. No extracranial or intracranial flow reducing lesion is evident. Electronically Signed   By: Staci Righter M.D.   On: 05/15/2019 13:58   Mr Brain Wo Contrast  Result Date: 05/15/2019 CLINICAL DATA:  83 year old female with dementia and atrial fibrillation presenting with altered mental status. EXAM: MRI HEAD WITHOUT CONTRAST MRA NECK WITHOUT CONTRAST TECHNIQUE: Multiplanar, multiecho pulse sequences of the brain and surrounding structures were obtained without intravenous contrast. Angiographic images of the neck were obtained using MRA technique with and without intravenous contrast. Carotid stenosis measurements (when applicable) are obtained utilizing NASCET criteria, using the  distal internal carotid diameter as the denominator. CONTRAST:  None. COMPARISON:  CT head 05/14/2019. Remote MR head 12/03/2005 FINDINGS: MRI HEAD FINDINGS Patient was uncooperative. Some series are degraded by motion or could not can be completed. Brain: No evidence for acute infarction, hemorrhage, mass lesion, hydrocephalus, or extra-axial fluid. Generalized atrophy. Remote RIGHT frontal infarct/hematoma, with chronic blood products layering the encephalomalacia cavity. Also remote RIGHT occipital infarct is noted. Both these abnormalities have been present since 2007. T2 and FLAIR hyperintensities in the white matter favored to represent chronic microvascular ischemic change. Vascular: Flow voids are maintained. Skull and upper cervical spine: Normal marrow signal. Mild pannus. Sinuses/Orbits: No acute findings. Other: None. MRA NECK FINDINGS Noncontrast examination was performed. Conventional branching of the great vessels from the arch without visible proximal stenosis. Carotid bifurcations demonstrate no flow reducing lesion. Posterior wall plaque at the LEFT carotid bifurcation is suggested, without  findings to suggest greater than 50% stenosis. BILATERAL vertebral arteries are patent, RIGHT dominant. IMPRESSION: 1. No acute intracranial findings. Atrophy and small vessel disease. Remote ischemic changes as described. 2. No extracranial or intracranial flow reducing lesion is evident. Electronically Signed   By: Elsie StainJohn T Curnes M.D.   On: 05/15/2019 13:58   Vas [place] Lower Extremity Venous (dvt) (only Mc & Wl 7a-7p)  Result Date: 05/15/2019  Lower Venous Study Indications: Pain, Swelling, and syncope.  Comparison Study: No prior Performing Technologist: Sherren Kernsandace Kanady RVS  Examination Guidelines: A complete evaluation includes B-mode imaging, spectral Doppler, color Doppler, and power Doppler as needed of all accessible portions of each vessel. Bilateral testing is considered an integral part of a complete examination. Limited examinations for reoccurring indications may be performed as noted.  +-----+---------------+---------+-----------+----------+--------------+  RIGHT Compressibility Phasicity Spontaneity Properties Thrombus Aging  +-----+---------------+---------+-----------+----------+--------------+  CFV   Full            Yes       Yes                                    +-----+---------------+---------+-----------+----------+--------------+   +---------+---------------+---------+-----------+----------+--------------+  LEFT      Compressibility Phasicity Spontaneity Properties Thrombus Aging  +---------+---------------+---------+-----------+----------+--------------+  CFV       Full            Yes       Yes                                    +---------+---------------+---------+-----------+----------+--------------+  SFJ       Full                                                             +---------+---------------+---------+-----------+----------+--------------+  FV Prox   Full                                                              +---------+---------------+---------+-----------+----------+--------------+  FV Mid    Full                                                             +---------+---------------+---------+-----------+----------+--------------+  FV Distal Full                                                             +---------+---------------+---------+-----------+----------+--------------+  PFV       Full                                                             +---------+---------------+---------+-----------+----------+--------------+  POP       Full            Yes       Yes                                    +---------+---------------+---------+-----------+----------+--------------+  PTV       None                                             Acute           +---------+---------------+---------+-----------+----------+--------------+  PERO      None                                             Acute           +---------+---------------+---------+-----------+----------+--------------+     Summary: Right: No evidence of common femoral vein obstruction. Left: Findings consistent with acute deep vein thrombosis involving the left posterior tibial veins, and left peroneal veins.  *See table(s) above for measurements and observations. Electronically signed by Lemar Livings MD on 05/15/2019 at 10:58:36 AM.    Final     Microbiology: Recent Results (from the past 240 hour(s))  SARS CORONAVIRUS 2 (TAT 6-24 HRS) Nasopharyngeal Nasopharyngeal Swab     Status: None   Collection Time: 05/18/19  8:53 AM   Specimen: Nasopharyngeal Swab  Result Value Ref Range Status   SARS Coronavirus 2 NEGATIVE NEGATIVE Final    Comment: (NOTE) SARS-CoV-2 target nucleic acids are NOT DETECTED. The SARS-CoV-2 RNA is generally detectable in upper and lower respiratory specimens during the acute phase of infection. Negative results do not preclude SARS-CoV-2 infection, do not rule out co-infections with other pathogens, and should not be used as  the sole basis for treatment or other patient management decisions. Negative results must be combined with clinical observations, patient history, and epidemiological information. The expected result is Negative. Fact Sheet for Patients: HairSlick.no Fact Sheet for Healthcare Providers: quierodirigir.com This test is not yet approved or cleared by the [place] FDA and  has been authorized for detection and/or diagnosis of SARS-CoV-2 by FDA under an Emergency Use Authorization (EUA). This EUA will remain  in effect (meaning this test can be used) for the duration of the COVID-19 declaration under Section 56 4(b)(1) of the Act, 21 U.S.C. section 360bbb-3(b)(1), unless the authorization is terminated or revoked sooner. Performed at  [facility], [address] [place], [place] [address]      Labs: Basic Metabolic Panel: Recent Labs  Lab 05/20/19 0726 05/21/19 0650 05/23/19 0647 05/26/19 0855  NA 145 143 147* 143  K 3.2* 3.8 3.6 3.9  CL 109 108 108 103  CO2 32  GLUCOSE 123* 128* 95 101*  BUN 33* 30* 38* 53*  CREATININE 1.20* 1.22* 1.28* 1.46*  CALCIUM 9.1 9.1 9.3 9.4  MG 2.1  --  2.4  --    Liver Function Tests: Recent Labs  Lab 05/20/19 0726  AST 22  ALT 20  ALKPHOS 69  BILITOT 0.9  PROT 5.8*  ALBUMIN 3.2*   No results for input(s): LIPASE, AMYLASE in the last 168 hours. No results for input(s): AMMONIA in the last 168 hours. CBC: Recent Labs  Lab 05/19/19 1646 05/20/19 0726 05/21/19 0650 05/23/19 0647  WBC 16.2* 12.0* 12.2* 7.3  NEUTROABS  --  9.1*  --   --   HGB 15.6* 14.3 14.8 13.7  HCT 46.5* 42.8 45.1 41.7  MCV 94.1 93.9 95.6 95.4  PLT 249 249 255 312   Cardiac Enzymes: No results for input(s): CKTOTAL, CKMB, CKMBINDEX, TROPONINI in the last 168 hours. BNP: BNP (last 3 results) No results for input(s): BNP in the last 8760 hours.  ProBNP (last 3 results) No results for  input(s): PROBNP in the last 8760 hours.  CBG: No results for input(s): GLUCAP in the last 168 hours.     Signed:  Kendell Bane, MD [facility] 05/26/2019, 3:04 PM

## 2019-05-26 NOTE — TOC Progression Note (Signed)
Transition of Care [facility]) - Progression Note    Patient Details  Name: BREANNAH KRATT MRN: [id number] Date of Birth: 06-04-1936  [facility] CM/SW Contact  Pollie Friar, RN Phone Number: 05/26/2019, 10:18 AM  Clinical Narrative:    Attempting to find LT care facility for the patient. [facility] was not affordable for the family. CM has reached out to [facility] to see if they are able to accept. Son: Eddie Dibbles is aware and in contact with CM daily.  TOC following.   Expected Discharge Plan: Skilled Nursing Facility Barriers to Discharge: Ship broker, Continued Medical Work up  Expected Discharge Plan and Services Expected Discharge Plan: [place] In-house Referral: Clinical Social Work Discharge Planning Services: NA Post Acute Care Choice: [place] Living arrangements for the past 2 months: Single Family Home Expected Discharge Date: 05/24/19               DME Arranged: N/A DME Agency: NA                   Social Determinants of Health (SDOH) Interventions    Readmission Risk Interventions No flowsheet data found.

## 2019-05-26 NOTE — Progress Notes (Signed)
Occupational Therapy Treatment Patient Details Name: Wanda Mckenzie MRN: [id number] DOB: 21-Jan-1936 Today's Date: 05/26/2019    History of present illness  Wanda Mckenzie is a 83 y.o. female with medical history significant of CVA; HTN; HLD; COPD; dementia; and afib presenting with AMS. Admitted for TIA CVA work up/seizure rule out evaluation. CT negative for acute abnormality; pending MRI. On heparin drip for acute left lower extremity DVT.    OT comments  Pt making progress with functional goals.  Pt gets distracted in transitions and stops participating in instructed task and stops mobilizing. Pt requires multimodal cues and max repitition for every task to completion. Pt sat EOB mod A and participated in grooming and ADLs, sit - stand from EOB to RW and transfer to [facility] with mod A. Notice pt had BM once she stood and pt unaware; required total A for hygiene. Pt pleasantly confused and cooperative. OT will continue to follow acutely  Follow Up Recommendations  SNF;Supervision/Assistance - 24 hour    Equipment Recommendations  None recommended by OT    Recommendations for Other Services      Precautions / Restrictions Precautions Precautions: Fall       Mobility Bed Mobility Overal bed mobility: Needs Assistance Bed Mobility: Supine to Sit;Sit to Supine     Supine to sit: Mod assist Sit to supine: Mod assist   General bed mobility comments: Max A to sequence steps, cues for hand placement and overall technique throughout. Pt able to move LE's to EOB but needed physical cues for task continuation  Transfers Overall transfer level: Needs assistance   Transfers: Sit to/from Stand;Stand Pivot Transfers Sit to Stand: Mod assist Stand pivot transfers: Mod assist       General transfer comment: cues for hand placement and technique. Pt gets distracted in transitions and stops mobilizing.    Balance Overall balance assessment: Needs assistance Sitting-balance support: Feet  supported Sitting balance-Leahy Scale: Fair Sitting balance - Comments: supervision for safety   Standing balance support: Bilateral upper extremity supported;Single extremity supported;During functional activity Standing balance-Leahy Scale: Poor                             ADL either performed or assessed with clinical judgement   ADL Overall ADL's : Needs assistance/impaired Eating/Feeding: Supervision/ safety;Set up;Sitting   Grooming: Set up;Sitting;Cueing for sequencing;Cueing for safety           Upper Body Dressing : Min guard;Sitting;Cueing for sequencing   Lower Body Dressing: Maximal assistance Lower Body Dressing Details (indicate cue type and reason): max a to don socks, heavy multimodal cues to complete task Toilet Transfer: Moderate assistance;Ambulation;RW;Cueing for safety;Cueing for sequencing Toilet Transfer Details (indicate cue type and reason): Pt needing cues to sequence steps as pt becomes distracted once standing         Functional mobility during ADLs: Moderate assistance;Cueing for sequencing;Cueing for safety;Rolling walker       Vision Patient Visual Report: No change from baseline     Perception     Praxis      Cognition Arousal/Alertness: Awake/alert Behavior During Therapy: WFL for tasks assessed/performed Overall Cognitive Status: History of cognitive impairments - at baseline                                 General Comments: hx of dementia. Pt's attention very limited, stops tasks midway and  seems to have forgotten what she was doing        Exercises     Shoulder Instructions       General Comments      Pertinent Vitals/ Pain       Pain Assessment: No/denies pain Faces Pain Scale: No hurt Pain Intervention(s): Monitored during session  Home Living                                          Prior Functioning/Environment              Frequency  Min 2X/week         Progress Toward Goals  OT Goals(current goals can now be found in the care plan section)  Progress towards OT goals: Progressing toward goals     Plan Discharge plan remains appropriate;Frequency remains appropriate    Co-evaluation                 AM-PAC OT "6 Clicks" Daily Activity     Outcome Measure   Help from another person eating meals?: A Little Help from another person taking care of personal grooming?: A Little Help from another person toileting, which includes using toliet, bedpan, or urinal?: A Lot Help from another person bathing (including washing, rinsing, drying)?: A Lot Help from another person to put on and taking off regular upper body clothing?: A Little Help from another person to put on and taking off regular lower body clothing?: A Lot 6 Click Score: 15    End of Session Equipment Utilized During Treatment: Gait belt;Rolling walker  OT Visit Diagnosis: Unsteadiness on feet (R26.81);Muscle weakness (generalized) (M62.81);Other symptoms and signs involving cognitive function   Activity Tolerance Patient tolerated treatment well   Patient Left in bed;with call bell/phone within reach;with bed alarm set   Nurse Communication          Time: 8938-1017 OT Time Calculation (min): 28 min  Charges: OT General Charges $OT Visit: 1 Visit OT Treatments $Self Care/Home Management : 8-22 mins $Therapeutic Activity: 8-22 mins     Britt Bottom 05/26/2019, 2:09 PM

## 2019-05-28 DIAGNOSIS — I5022 Chronic systolic (congestive) heart failure: Secondary | ICD-10-CM | POA: Diagnosis not present

## 2019-05-28 DIAGNOSIS — I482 Chronic atrial fibrillation, unspecified: Secondary | ICD-10-CM | POA: Diagnosis not present

## 2019-05-28 DIAGNOSIS — N179 Acute kidney failure, unspecified: Secondary | ICD-10-CM | POA: Diagnosis not present

## 2019-05-28 DIAGNOSIS — F0150 Vascular dementia without behavioral disturbance: Secondary | ICD-10-CM | POA: Diagnosis not present

## 2019-05-28 DIAGNOSIS — N1832 Chronic kidney disease, stage 3b: Secondary | ICD-10-CM | POA: Diagnosis not present

## 2019-05-28 DIAGNOSIS — E039 Hypothyroidism, unspecified: Secondary | ICD-10-CM | POA: Diagnosis not present

## 2019-05-28 DIAGNOSIS — I82402 Acute embolism and thrombosis of unspecified deep veins of left lower extremity: Secondary | ICD-10-CM | POA: Diagnosis not present

## 2019-05-28 DIAGNOSIS — J441 Chronic obstructive pulmonary disease with (acute) exacerbation: Secondary | ICD-10-CM | POA: Diagnosis not present

## 2019-05-28 DIAGNOSIS — I130 Hypertensive heart and chronic kidney disease with heart failure and stage 1 through stage 4 chronic kidney disease, or unspecified chronic kidney disease: Secondary | ICD-10-CM | POA: Diagnosis not present

## 2019-05-29 NOTE — Progress Notes (Deleted)
Assessment & Plan:  ***  No follow-ups on file.  Hendricks Limes, MSN, APRN, FNP-C [facility]  Subjective:    Patient ID: STEPHANIEMARIE STOFFEL, female    DOB: 31-May-1936, 83 y.o.   MRN: [id number]  Patient Care Team: Loman Brooklyn, FNP as PCP - General (Family Medicine)   Chief Complaint: No chief complaint on file.   HPI: MRYTLE BENTO is a 83 y.o. female presenting on 05/31/2019 for No chief complaint on file.  Patient was hospitalized from 05/14/2019 - 05/26/2019 due to altered mental status.  Her INR was subtherapeutic at 1.2.  She was diagnosed with an acute left lower extremity DVT.  She was switched from Coumadin to Eliquis.  There was no evidence of acute CVA on MRI of brain.  EEG did not show any seizure activity.  Echo on 05/14/2019 showed an EF of 40 to 45% SNF was recommended.  Patient to follow-up with neurology and cardiology as an outpatient.  DNR  [facility]?  Told to ask about Ensure 3 times daily x7 days?  New complaints: ***  Social history:  Relevant past medical, surgical, family and social history reviewed and updated as indicated. Interim medical history since our last visit reviewed.  Allergies and medications reviewed and updated.  DATA REVIEWED: CHART IN EPIC  ROS: Negative unless specifically indicated above in HPI.    Current Outpatient Medications:  .  acetaminophen (TYLENOL) 500 MG tablet, Take 1,000 mg by mouth every 6 (six) hours as needed for headache (pain)., Disp: , Rfl:  .  albuterol (VENTOLIN HFA) 108 (90 Base) MCG/ACT inhaler, Inhale 2 puffs into the lungs every 6 (six) hours as needed for wheezing or shortness of breath., Disp: 18 g, Rfl: 2 .  apixaban (ELIQUIS) 5 MG TABS tablet, Take 1 tablet (5 mg total) by mouth 2 (two) times daily., Disp: 60 tablet, Rfl: 3 .  atorvastatin (LIPITOR) 20 MG tablet, Take 1 tablet (20 mg total) by mouth daily. (Patient taking differently: Take 20 mg by mouth at bedtime. ),  Disp: 90 tablet, Rfl: 3 .  furosemide (LASIX) 40 MG tablet, Take 1 tablet (40 mg total) by mouth daily., Disp: 30 tablet, Rfl: 1 .  hydrALAZINE (APRESOLINE) 100 MG tablet, Take 1 tablet (100 mg total) by mouth every 8 (eight) hours., Disp: 90 tablet, Rfl: 0 .  levothyroxine (SYNTHROID) 25 MCG tablet, Take 0.5 tablets (12.5 mcg total) by mouth daily before breakfast., Disp: 30 tablet, Rfl: 0 .  losartan (COZAAR) 100 MG tablet, Take 1 tablet (100 mg total) by mouth daily., Disp: 30 tablet, Rfl: 5 .  losartan (COZAAR) 25 MG tablet, Take 1 tablet (25 mg total) by mouth daily., Disp: 30 tablet, Rfl: 1 .  Memantine HCl-Donepezil HCl (NAMZARIC) 28-10 MG CP24, Take 1 capsule by mouth at bedtime. (Patient taking differently: Take 1 capsule by mouth every morning. ), Disp: 30 capsule, Rfl: 5 .  metoprolol tartrate (LOPRESSOR) 25 MG tablet, Take 1 tablet (25 mg total) by mouth 2 (two) times daily., Disp: 60 tablet, Rfl: 0 .  potassium chloride SA (KLOR-CON) 20 MEQ tablet, Take 1 tablet (20 mEq total) by mouth daily., Disp: 30 tablet, Rfl: 0 .  Spacer/Aero-Holding Chambers (AEROCHAMBER PLUS) inhaler, Use as instructed, Disp: 1 each, Rfl: 2   Allergies  Allergen Reactions  . Codeine     REACTION: vomiting   Past Medical History:  Diagnosis Date  . Atrial fibrillation ([place])   . COPD (chronic obstructive pulmonary  disease) ([facility])   . Dyslipidemia   . Essential hypertension   . History of cardiac catheterization 11/2013   Normal coronaries  . History of stroke    Right MCA distribution     Past Surgical History:  Procedure Laterality Date  . ABDOMINAL HYSTERECTOMY  1975  . FRACTURE SURGERY Left   . LEFT HEART CATHETERIZATION WITH CORONARY ANGIOGRAM N/A 11/23/2013   Procedure: LEFT HEART CATHETERIZATION WITH CORONARY ANGIOGRAM;  Surgeon: Kathleene Hazel, MD;  Location: [facility] CATH LAB;  Service: Cardiovascular;  Laterality: N/A;  . LEFT HIP HEMIARTHROPLASTY    . TRIBUTARY VARICOSITIES OF RIGHT LEG   05/23/2002  . VEIN LIGATION AND STRIPPING Right 02/08/2013   Procedure: Segmental excision of painful varicose veins right lower extremity;  Surgeon: Chuck Hint, MD;  Location: [facility] OR;  Service: Vascular;  Laterality: Right;    Social History   Socioeconomic History  . Marital status: Married    Spouse name: Not on file  . Number of children: Not on file  . Years of education: Not on file  . Highest education level: Not on file  Occupational History  . Not on file  Social Needs  . Financial resource strain: Not on file  . Food insecurity    Worry: Not on file    Inability: Not on file  . Transportation needs    Medical: Not on file    Non-medical: Not on file  Tobacco Use  . Smoking status: Never Smoker  . Smokeless tobacco: Never Used  Substance and Sexual Activity  . Alcohol use: No    Alcohol/week: 0.0 standard drinks  . Drug use: No  . Sexual activity: Not Currently  Lifestyle  . Physical activity    Days per week: Not on file    Minutes per session: Not on file  . Stress: Not on file  Relationships  . Social Musician on phone: Not on file    Gets together: Not on file    Attends religious service: Not on file    Active member of club or organization: Not on file    Attends meetings of clubs or organizations: Not on file    Relationship status: Not on file  . Intimate partner violence    Fear of current or ex partner: Not on file    Emotionally abused: Not on file    Physically abused: Not on file    Forced sexual activity: Not on file  Other Topics Concern  . Not on file  Social History Narrative  . Not on file        Objective:    There were no vitals taken for this visit.  Physical Exam  Lab Results  Component Value Date   TSH 6.720 (H) 04/14/2019   Lab Results  Component Value Date   WBC 7.3 05/23/2019   HGB 13.7 05/23/2019   HCT 41.7 05/23/2019   MCV 95.4 05/23/2019   PLT 312 05/23/2019   Lab Results  Component  Value Date   NA 143 05/26/2019   K 3.9 05/26/2019   CO2 32 05/26/2019   GLUCOSE 101 (H) 05/26/2019   BUN 53 (H) 05/26/2019   CREATININE 1.46 (H) 05/26/2019   BILITOT 0.9 05/20/2019   ALKPHOS 69 05/20/2019   AST 22 05/20/2019   ALT 20 05/20/2019   PROT 5.8 (L) 05/20/2019   ALBUMIN 3.2 (L) 05/20/2019   CALCIUM 9.4 05/26/2019   ANIONGAP 8 05/26/2019   Lab Results  Component Value Date   CHOL 126 05/15/2019   Lab Results  Component Value Date   HDL 28 (L) 05/15/2019   Lab Results  Component Value Date   LDLCALC 85 05/15/2019   Lab Results  Component Value Date   TRIG 64 05/15/2019   Lab Results  Component Value Date   CHOLHDL 4.5 05/15/2019   Lab Results  Component Value Date   HGBA1C 5.5 05/15/2019

## 2019-05-30 DIAGNOSIS — N179 Acute kidney failure, unspecified: Secondary | ICD-10-CM | POA: Diagnosis not present

## 2019-05-30 DIAGNOSIS — I482 Chronic atrial fibrillation, unspecified: Secondary | ICD-10-CM | POA: Diagnosis not present

## 2019-05-30 DIAGNOSIS — J441 Chronic obstructive pulmonary disease with (acute) exacerbation: Secondary | ICD-10-CM | POA: Diagnosis not present

## 2019-05-30 DIAGNOSIS — I130 Hypertensive heart and chronic kidney disease with heart failure and stage 1 through stage 4 chronic kidney disease, or unspecified chronic kidney disease: Secondary | ICD-10-CM | POA: Diagnosis not present

## 2019-05-30 DIAGNOSIS — F0150 Vascular dementia without behavioral disturbance: Secondary | ICD-10-CM | POA: Diagnosis not present

## 2019-05-30 DIAGNOSIS — N1832 Chronic kidney disease, stage 3b: Secondary | ICD-10-CM | POA: Diagnosis not present

## 2019-05-30 DIAGNOSIS — E039 Hypothyroidism, unspecified: Secondary | ICD-10-CM | POA: Diagnosis not present

## 2019-05-30 DIAGNOSIS — I5022 Chronic systolic (congestive) heart failure: Secondary | ICD-10-CM | POA: Diagnosis not present

## 2019-05-30 DIAGNOSIS — I82402 Acute embolism and thrombosis of unspecified deep veins of left lower extremity: Secondary | ICD-10-CM | POA: Diagnosis not present

## 2019-05-31 ENCOUNTER — Ambulatory Visit: Payer: Medicare HMO | Admitting: Family Medicine

## 2019-05-31 DIAGNOSIS — J441 Chronic obstructive pulmonary disease with (acute) exacerbation: Secondary | ICD-10-CM | POA: Diagnosis not present

## 2019-05-31 DIAGNOSIS — I130 Hypertensive heart and chronic kidney disease with heart failure and stage 1 through stage 4 chronic kidney disease, or unspecified chronic kidney disease: Secondary | ICD-10-CM | POA: Diagnosis not present

## 2019-05-31 DIAGNOSIS — F0150 Vascular dementia without behavioral disturbance: Secondary | ICD-10-CM | POA: Diagnosis not present

## 2019-05-31 DIAGNOSIS — I82402 Acute embolism and thrombosis of unspecified deep veins of left lower extremity: Secondary | ICD-10-CM | POA: Diagnosis not present

## 2019-05-31 DIAGNOSIS — I482 Chronic atrial fibrillation, unspecified: Secondary | ICD-10-CM | POA: Diagnosis not present

## 2019-05-31 DIAGNOSIS — E039 Hypothyroidism, unspecified: Secondary | ICD-10-CM | POA: Diagnosis not present

## 2019-05-31 DIAGNOSIS — N179 Acute kidney failure, unspecified: Secondary | ICD-10-CM | POA: Diagnosis not present

## 2019-05-31 DIAGNOSIS — N1832 Chronic kidney disease, stage 3b: Secondary | ICD-10-CM | POA: Diagnosis not present

## 2019-05-31 DIAGNOSIS — I5022 Chronic systolic (congestive) heart failure: Secondary | ICD-10-CM | POA: Diagnosis not present

## 2019-06-02 ENCOUNTER — Other Ambulatory Visit: Payer: Self-pay

## 2019-06-02 ENCOUNTER — Encounter: Payer: Self-pay | Admitting: Cardiology

## 2019-06-02 ENCOUNTER — Ambulatory Visit (INDEPENDENT_AMBULATORY_CARE_PROVIDER_SITE_OTHER): Payer: Medicare HMO | Admitting: Family Medicine

## 2019-06-02 VITALS — BP 122/70 | HR 69 | Ht 72.0 in | Wt 179.0 lb

## 2019-06-02 DIAGNOSIS — I10 Essential (primary) hypertension: Secondary | ICD-10-CM

## 2019-06-02 DIAGNOSIS — I482 Chronic atrial fibrillation, unspecified: Secondary | ICD-10-CM

## 2019-06-02 DIAGNOSIS — I82402 Acute embolism and thrombosis of unspecified deep veins of left lower extremity: Secondary | ICD-10-CM | POA: Diagnosis not present

## 2019-06-02 DIAGNOSIS — I428 Other cardiomyopathies: Secondary | ICD-10-CM

## 2019-06-02 MED ORDER — METOPROLOL SUCCINATE ER 25 MG PO TB24: 25.0000 mg | ORAL_TABLET | Freq: Every day | ORAL | 2 refills | Status: DC

## 2019-06-02 NOTE — Patient Instructions (Addendum)
Medication Instructions:   Your physician has recommended you make the following change in your medication:   Stop metoprolol tartrate  Start metoprolol succinate 25 mg by mouth daily  Continue other medications the same  Labwork:  NONE  Testing/Procedures:  NONE  Follow-Up:  Your physician recommends that you schedule a follow-up appointment in: 1 year (office). You will receive a reminder letter in the mail in about 10 months reminding you to call and schedule your appointment. If you don't receive this letter, please contact our office.  Any Other Special Instructions Will Be Listed Below (If Applicable).  If you need a refill on your cardiac medications before your next appointment, please call your pharmacy.

## 2019-06-02 NOTE — Progress Notes (Signed)
Cardiology Office Note  Date: 06/02/2019   ID: Shellee, Streng [birth year], MRN [id number]  PCP:  Loman Brooklyn, FNP  Cardiologist:  Rozann Lesches, MD Electrophysiologist:  None   Chief Complaint  Patient presents with  . Follow-up    History of Present Illness: Wanda Mckenzie is a 83 y.o. female last encounter with Dr. Domenic Polite and January 2017.  History of right MCA CVA, atrial fibrillation, hypertension, nonischemic cardiomyopathy, currently on Eliquis for DVT.  Patient was recently seen at [facility] for presentation of altered mental status and history of dementia.  She was diagnosed with acute metabolic encephalopathy in he context of dementia.  CVA and seizure activity was ruled out.  She also had some hypokalemia which resolved.  She had a newly diagnosed left lower extremity DVT.  Her Coumadin was discontinued and she was started on Eliquis.  She denies progressive anginal or exertional symptoms.  No complaints of palpitations or arrhythmias.  No complaints of lower extremity edema, or leg pain.  She she is responding appropriately to questions and does have some memory loss per family members.  She is on donezepil.  All medications reviewed with patient and family.  She remains on Eliquis for recent diagnosis of DVT.  Past Medical History:  Diagnosis Date  . Atrial fibrillation ([place])   . COPD (chronic obstructive pulmonary disease) ([place])   . Dyslipidemia   . Essential hypertension   . History of cardiac catheterization 11/2013   Normal coronaries  . History of stroke    Right MCA distribution     Past Surgical History:  Procedure Laterality Date  . ABDOMINAL HYSTERECTOMY  1975  . FRACTURE SURGERY Left   . LEFT HEART CATHETERIZATION WITH CORONARY ANGIOGRAM N/A 11/23/2013   Procedure: LEFT HEART CATHETERIZATION WITH CORONARY ANGIOGRAM;  Surgeon: Burnell Blanks, MD;  Location: [facility] CATH LAB;  Service: Cardiovascular;  Laterality: N/A;  .  LEFT HIP HEMIARTHROPLASTY    . TRIBUTARY VARICOSITIES OF RIGHT LEG  05/23/2002  . VEIN LIGATION AND STRIPPING Right 02/08/2013   Procedure: Segmental excision of painful varicose veins right lower extremity;  Surgeon: Angelia Mould, MD;  Location: [facility] OR;  Service: Vascular;  Laterality: Right;    Current Outpatient Medications  Medication Sig Dispense Refill  . acetaminophen (TYLENOL) 500 MG tablet Take 1,000 mg by mouth every 6 (six) hours as needed for headache (pain).    Marland Kitchen albuterol (VENTOLIN HFA) 108 (90 Base) MCG/ACT inhaler Inhale 2 puffs into the lungs every 6 (six) hours as needed for wheezing or shortness of breath. 18 g 2  . apixaban (ELIQUIS) 5 MG TABS tablet Take 1 tablet (5 mg total) by mouth 2 (two) times daily. 60 tablet 3  . atorvastatin (LIPITOR) 20 MG tablet Take 1 tablet (20 mg total) by mouth daily. (Patient taking differently: Take 20 mg by mouth at bedtime. ) 90 tablet 3  . furosemide (LASIX) 40 MG tablet Take 1 tablet (40 mg total) by mouth daily. 30 tablet 1  . hydrALAZINE (APRESOLINE) 100 MG tablet Take 1 tablet (100 mg total) by mouth every 8 (eight) hours. 90 tablet 0  . levothyroxine (SYNTHROID) 25 MCG tablet Take 0.5 tablets (12.5 mcg total) by mouth daily before breakfast. 30 tablet 0  . losartan (COZAAR) 100 MG tablet Take 1 tablet (100 mg total) by mouth daily. 30 tablet 5  . losartan (COZAAR) 25 MG tablet Take 1 tablet (25 mg total) by mouth daily. 30 tablet  1  . Memantine HCl-Donepezil HCl (NAMZARIC) 28-10 MG CP24 Take 1 capsule by mouth at bedtime. (Patient taking differently: Take 1 capsule by mouth every morning. ) 30 capsule 5  . potassium chloride SA (KLOR-CON) 20 MEQ tablet Take 1 tablet (20 mEq total) by mouth daily. 30 tablet 0  . Spacer/Aero-Holding Chambers (AEROCHAMBER PLUS) inhaler Use as instructed 1 each 2  . metoprolol succinate (TOPROL XL) 25 MG 24 hr tablet Take 1 tablet (25 mg total) by mouth daily. 90 tablet 2   No current  facility-administered medications for this visit.   Allergies:  Codeine   Social History: The patient  reports that she has never smoked. She has never used smokeless tobacco. She reports that she does not drink alcohol or use drugs.   Family History: [name] family history includes Cancer in her mother, sister, sister, and sister; Hypertension in her mother and son; Stroke in her father.   ROS:  Please see the history of present illness. Otherwise, complete review of systems is positive for none.  All other systems are reviewed and negative.   Physical Exam: VS:  BP 122/70   Pulse 69   Ht 6' (1.829 m)   Wt 179 lb (81.2 kg)   SpO2 97%   BMI 24.28 kg/m , BMI Body mass index is 24.28 kg/m.  Wt Readings from Last 3 Encounters:  06/02/19 179 lb (81.2 kg)  05/25/19 169 lb 5 oz (76.8 kg)  04/29/19 175 lb 3.2 oz (79.5 kg)    General: Patient appears comfortable at rest. Neck: Supple, no elevated JVP or carotid bruits, no thyromegaly. Lungs: Clear to auscultation, nonlabored breathing at rest. Cardiac: Regular rate and rhythm, no S3 or significant systolic murmur, no pericardial rub. Extremities: trace edema, distal pulses 2+. Skin: Warm and dry. Neuropsychiatric: Alert and oriented x3, affect grossly appropriate.  ECG:  Recent EKG on May 15, 2019 showed atrial fibrillation with slow ventricular response.  Left ventricular hypertrophy with QRS widening and repolarization abnormality.  Inferior infarct age undetermined.  Heart rate of 52.  Recent Labwork: 04/14/2019: TSH 6.720 05/20/2019: ALT 20; AST 22 05/23/2019: Hemoglobin 13.7; Magnesium 2.4; Platelets 312 05/26/2019: BUN 53; Creatinine, Ser 1.46; Potassium 3.9; Sodium 143     Component Value Date/Time   CHOL 126 05/15/2019 0211   TRIG 64 05/15/2019 0211   HDL 28 (L) 05/15/2019 0211   CHOLHDL 4.5 05/15/2019 0211   VLDL 13 05/15/2019 0211   LDLCALC 85 05/15/2019 0211    Other Studies Reviewed Today: Recent echo  on 05/14/2019 IMPRESSIONS   1. Left ventricular ejection fraction, by visual estimation, is 40 to 45%. The left ventricle has mild to moderately decreased function. There is no left ventricular hypertrophy.  2. Left ventricular diastolic function could not be evaluated.  3. The left ventricle demonstrates global hypokinesis.  4. Diffuse global hypokinesis with more pronounced hypokinesis in inferior/inferoseptum.  5. Global right ventricle has normal systolic function.The right ventricular size is normal. No increase in right ventricular wall thickness.  6. Left atrial size was severely dilated.  7. Right atrial size was moderately dilated.  8. Small pericardial effusion.  9. Mild mitral annular calcification. 10. The mitral valve is normal in structure. Trace mitral valve regurgitation. 11. The tricuspid valve is normal in structure. Tricuspid valve regurgitation is mild. 12. Aortic valve regurgitation is moderate. 13. The aortic valve is tricuspid. Aortic valve regurgitation is moderate. Mild aortic valve sclerosis without stenosis. 14. There is Mild calcification of the  aortic valve. 15. There is Mild thickening of the aortic valve. 16. The pulmonic valve was grossly normal. Pulmonic valve regurgitation is trivial. 17. Normal pulmonary artery systolic pressure. 18. The inferior vena cava is normal in size with greater than 50% respiratory variability, suggesting right atrial pressure of 3 mmHg.  Assessment and Plan:  1. Chronic atrial fibrillation (HCC)   2. Essential hypertension   3. Deep vein thrombosis (DVT) of left lower extremity, unspecified chronicity, unspecified vein (HCC)   4. Nonischemic cardiomyopathy (HCC)    1. Chronic atrial fibrillation (HCC) Pulse is regular today on exam.  Heart rate is 69 today.  Patient denies any recent palpitations, fluttering, or racing heart.  Stop metoprolol tartrate after finishing current bottle and start metoprolol succinate 25 mg  daily.  Continue Eliquis 5 mg p.o. twice daily.  Advised family to continue metoprolol titrate until current bottle is empty then start metoprolol succinate once a day at same dosage.  Daughter verbalizes understanding.  2. Essential hypertension Blood pressure 122/70 today.  Continue losartan 100 mg daily and hydralazine 100 mg 3 times daily.   3. Deep vein thrombosis (DVT) of left lower extremity, unspecified chronicity, unspecified vein (HCC) Recently diagnosed DVT.  Coumadin was discontinued and she was started on Eliquis.  Unable to locate DVT study performed on patient.  4. Nonischemic cardiomyopathy ([facility]) Left ventricular ejection fraction, by visual estimation, is 40 to 45%. The left ventricle has mild to moderately decreased function.  Diffuse global hypokinesis more pronounced in inferior/inferior septum there is no left ventricular hypertrophy.  Trace MR, mild TR.  Moderate aortic regurgitation.  Continue ARB and BB.  Medication Adjustments/Labs and Tests Ordered: Current medicines are reviewed at length with the patient today.  Concerns regarding medicines are outlined above.    Patient Instructions  Medication Instructions:     Labwork:    Testing/Procedures:    Follow-Up:  Your physician recommends that you schedule a follow-up appointment in:   Any Other Special Instructions Will Be Listed Below (If Applicable).  If you need a refill on your cardiac medications before your next appointment, please call your pharmacy.        Signed, Rennis Harding, NP 06/02/2019 2:50 PM    [facility] [address] [place], [place], [place] [address] Phone: [phone]; Fax: [phone]

## 2019-06-03 DIAGNOSIS — N1832 Chronic kidney disease, stage 3b: Secondary | ICD-10-CM | POA: Diagnosis not present

## 2019-06-03 DIAGNOSIS — N179 Acute kidney failure, unspecified: Secondary | ICD-10-CM | POA: Diagnosis not present

## 2019-06-03 DIAGNOSIS — F0150 Vascular dementia without behavioral disturbance: Secondary | ICD-10-CM | POA: Diagnosis not present

## 2019-06-03 DIAGNOSIS — E039 Hypothyroidism, unspecified: Secondary | ICD-10-CM | POA: Diagnosis not present

## 2019-06-03 DIAGNOSIS — I130 Hypertensive heart and chronic kidney disease with heart failure and stage 1 through stage 4 chronic kidney disease, or unspecified chronic kidney disease: Secondary | ICD-10-CM | POA: Diagnosis not present

## 2019-06-03 DIAGNOSIS — I482 Chronic atrial fibrillation, unspecified: Secondary | ICD-10-CM | POA: Diagnosis not present

## 2019-06-03 DIAGNOSIS — I82402 Acute embolism and thrombosis of unspecified deep veins of left lower extremity: Secondary | ICD-10-CM | POA: Diagnosis not present

## 2019-06-03 DIAGNOSIS — I5022 Chronic systolic (congestive) heart failure: Secondary | ICD-10-CM | POA: Diagnosis not present

## 2019-06-03 DIAGNOSIS — J441 Chronic obstructive pulmonary disease with (acute) exacerbation: Secondary | ICD-10-CM | POA: Diagnosis not present

## 2019-06-06 ENCOUNTER — Other Ambulatory Visit: Payer: Self-pay | Admitting: *Deleted

## 2019-06-06 DIAGNOSIS — I10 Essential (primary) hypertension: Secondary | ICD-10-CM

## 2019-06-06 MED ORDER — METOPROLOL SUCCINATE ER 50 MG PO TB24: 50.0000 mg | ORAL_TABLET | Freq: Every day | ORAL | 6 refills | Status: DC

## 2019-06-07 DIAGNOSIS — N179 Acute kidney failure, unspecified: Secondary | ICD-10-CM | POA: Diagnosis not present

## 2019-06-07 DIAGNOSIS — J441 Chronic obstructive pulmonary disease with (acute) exacerbation: Secondary | ICD-10-CM | POA: Diagnosis not present

## 2019-06-07 DIAGNOSIS — I5022 Chronic systolic (congestive) heart failure: Secondary | ICD-10-CM | POA: Diagnosis not present

## 2019-06-07 DIAGNOSIS — I130 Hypertensive heart and chronic kidney disease with heart failure and stage 1 through stage 4 chronic kidney disease, or unspecified chronic kidney disease: Secondary | ICD-10-CM | POA: Diagnosis not present

## 2019-06-07 DIAGNOSIS — N1832 Chronic kidney disease, stage 3b: Secondary | ICD-10-CM | POA: Diagnosis not present

## 2019-06-07 DIAGNOSIS — F0150 Vascular dementia without behavioral disturbance: Secondary | ICD-10-CM | POA: Diagnosis not present

## 2019-06-07 DIAGNOSIS — I82402 Acute embolism and thrombosis of unspecified deep veins of left lower extremity: Secondary | ICD-10-CM | POA: Diagnosis not present

## 2019-06-07 DIAGNOSIS — I482 Chronic atrial fibrillation, unspecified: Secondary | ICD-10-CM | POA: Diagnosis not present

## 2019-06-07 DIAGNOSIS — E039 Hypothyroidism, unspecified: Secondary | ICD-10-CM | POA: Diagnosis not present

## 2019-06-09 DIAGNOSIS — N1832 Chronic kidney disease, stage 3b: Secondary | ICD-10-CM | POA: Diagnosis not present

## 2019-06-09 DIAGNOSIS — F0150 Vascular dementia without behavioral disturbance: Secondary | ICD-10-CM | POA: Diagnosis not present

## 2019-06-09 DIAGNOSIS — N179 Acute kidney failure, unspecified: Secondary | ICD-10-CM | POA: Diagnosis not present

## 2019-06-09 DIAGNOSIS — E039 Hypothyroidism, unspecified: Secondary | ICD-10-CM | POA: Diagnosis not present

## 2019-06-09 DIAGNOSIS — J441 Chronic obstructive pulmonary disease with (acute) exacerbation: Secondary | ICD-10-CM | POA: Diagnosis not present

## 2019-06-09 DIAGNOSIS — I82402 Acute embolism and thrombosis of unspecified deep veins of left lower extremity: Secondary | ICD-10-CM | POA: Diagnosis not present

## 2019-06-09 DIAGNOSIS — I5022 Chronic systolic (congestive) heart failure: Secondary | ICD-10-CM | POA: Diagnosis not present

## 2019-06-09 DIAGNOSIS — I130 Hypertensive heart and chronic kidney disease with heart failure and stage 1 through stage 4 chronic kidney disease, or unspecified chronic kidney disease: Secondary | ICD-10-CM | POA: Diagnosis not present

## 2019-06-09 DIAGNOSIS — I482 Chronic atrial fibrillation, unspecified: Secondary | ICD-10-CM | POA: Diagnosis not present

## 2019-06-10 DIAGNOSIS — I482 Chronic atrial fibrillation, unspecified: Secondary | ICD-10-CM | POA: Diagnosis not present

## 2019-06-10 DIAGNOSIS — N1832 Chronic kidney disease, stage 3b: Secondary | ICD-10-CM | POA: Diagnosis not present

## 2019-06-10 DIAGNOSIS — J441 Chronic obstructive pulmonary disease with (acute) exacerbation: Secondary | ICD-10-CM | POA: Diagnosis not present

## 2019-06-10 DIAGNOSIS — F0150 Vascular dementia without behavioral disturbance: Secondary | ICD-10-CM | POA: Diagnosis not present

## 2019-06-10 DIAGNOSIS — I5022 Chronic systolic (congestive) heart failure: Secondary | ICD-10-CM | POA: Diagnosis not present

## 2019-06-10 DIAGNOSIS — E039 Hypothyroidism, unspecified: Secondary | ICD-10-CM | POA: Diagnosis not present

## 2019-06-10 DIAGNOSIS — I82402 Acute embolism and thrombosis of unspecified deep veins of left lower extremity: Secondary | ICD-10-CM | POA: Diagnosis not present

## 2019-06-10 DIAGNOSIS — I130 Hypertensive heart and chronic kidney disease with heart failure and stage 1 through stage 4 chronic kidney disease, or unspecified chronic kidney disease: Secondary | ICD-10-CM | POA: Diagnosis not present

## 2019-06-10 DIAGNOSIS — N179 Acute kidney failure, unspecified: Secondary | ICD-10-CM | POA: Diagnosis not present

## 2019-06-14 ENCOUNTER — Other Ambulatory Visit: Payer: Self-pay

## 2019-06-14 ENCOUNTER — Ambulatory Visit (INDEPENDENT_AMBULATORY_CARE_PROVIDER_SITE_OTHER): Payer: Medicare HMO

## 2019-06-14 DIAGNOSIS — J441 Chronic obstructive pulmonary disease with (acute) exacerbation: Secondary | ICD-10-CM

## 2019-06-14 DIAGNOSIS — I82402 Acute embolism and thrombosis of unspecified deep veins of left lower extremity: Secondary | ICD-10-CM

## 2019-06-14 DIAGNOSIS — I130 Hypertensive heart and chronic kidney disease with heart failure and stage 1 through stage 4 chronic kidney disease, or unspecified chronic kidney disease: Secondary | ICD-10-CM

## 2019-06-14 DIAGNOSIS — E039 Hypothyroidism, unspecified: Secondary | ICD-10-CM | POA: Diagnosis not present

## 2019-06-14 DIAGNOSIS — I5022 Chronic systolic (congestive) heart failure: Secondary | ICD-10-CM

## 2019-06-14 DIAGNOSIS — N1832 Chronic kidney disease, stage 3b: Secondary | ICD-10-CM

## 2019-06-14 DIAGNOSIS — Z7901 Long term (current) use of anticoagulants: Secondary | ICD-10-CM

## 2019-06-14 DIAGNOSIS — I482 Chronic atrial fibrillation, unspecified: Secondary | ICD-10-CM | POA: Diagnosis not present

## 2019-06-14 DIAGNOSIS — Z9181 History of falling: Secondary | ICD-10-CM

## 2019-06-14 DIAGNOSIS — N179 Acute kidney failure, unspecified: Secondary | ICD-10-CM | POA: Diagnosis not present

## 2019-06-14 DIAGNOSIS — F0150 Vascular dementia without behavioral disturbance: Secondary | ICD-10-CM

## 2019-06-14 DIAGNOSIS — Z8673 Personal history of transient ischemic attack (TIA), and cerebral infarction without residual deficits: Secondary | ICD-10-CM

## 2019-06-14 DIAGNOSIS — E785 Hyperlipidemia, unspecified: Secondary | ICD-10-CM

## 2019-06-16 DIAGNOSIS — I482 Chronic atrial fibrillation, unspecified: Secondary | ICD-10-CM | POA: Diagnosis not present

## 2019-06-16 DIAGNOSIS — I82402 Acute embolism and thrombosis of unspecified deep veins of left lower extremity: Secondary | ICD-10-CM | POA: Diagnosis not present

## 2019-06-16 DIAGNOSIS — N1832 Chronic kidney disease, stage 3b: Secondary | ICD-10-CM | POA: Diagnosis not present

## 2019-06-16 DIAGNOSIS — I5022 Chronic systolic (congestive) heart failure: Secondary | ICD-10-CM | POA: Diagnosis not present

## 2019-06-16 DIAGNOSIS — N179 Acute kidney failure, unspecified: Secondary | ICD-10-CM | POA: Diagnosis not present

## 2019-06-16 DIAGNOSIS — I130 Hypertensive heart and chronic kidney disease with heart failure and stage 1 through stage 4 chronic kidney disease, or unspecified chronic kidney disease: Secondary | ICD-10-CM | POA: Diagnosis not present

## 2019-06-16 DIAGNOSIS — J441 Chronic obstructive pulmonary disease with (acute) exacerbation: Secondary | ICD-10-CM | POA: Diagnosis not present

## 2019-06-16 DIAGNOSIS — E039 Hypothyroidism, unspecified: Secondary | ICD-10-CM | POA: Diagnosis not present

## 2019-06-16 DIAGNOSIS — F0150 Vascular dementia without behavioral disturbance: Secondary | ICD-10-CM | POA: Diagnosis not present

## 2019-06-20 ENCOUNTER — Telehealth: Payer: Self-pay | Admitting: Family Medicine

## 2019-06-20 NOTE — Telephone Encounter (Signed)
Pt's granddaughter called and wants to speak with nurse to get more info on how a COVID test can be ordered and sent to pt's home so that pt doesn't have to come out of her home. Also wants to speak with someone regarding Home Health.

## 2019-06-20 NOTE — Telephone Encounter (Signed)
lmtcb

## 2019-06-21 ENCOUNTER — Ambulatory Visit (INDEPENDENT_AMBULATORY_CARE_PROVIDER_SITE_OTHER): Payer: Medicare HMO

## 2019-06-21 ENCOUNTER — Other Ambulatory Visit: Payer: Self-pay

## 2019-06-21 DIAGNOSIS — N179 Acute kidney failure, unspecified: Secondary | ICD-10-CM | POA: Diagnosis not present

## 2019-06-21 DIAGNOSIS — I82402 Acute embolism and thrombosis of unspecified deep veins of left lower extremity: Secondary | ICD-10-CM

## 2019-06-21 DIAGNOSIS — E039 Hypothyroidism, unspecified: Secondary | ICD-10-CM

## 2019-06-21 DIAGNOSIS — F0150 Vascular dementia without behavioral disturbance: Secondary | ICD-10-CM

## 2019-06-21 DIAGNOSIS — I5022 Chronic systolic (congestive) heart failure: Secondary | ICD-10-CM

## 2019-06-21 DIAGNOSIS — J441 Chronic obstructive pulmonary disease with (acute) exacerbation: Secondary | ICD-10-CM

## 2019-06-21 DIAGNOSIS — E785 Hyperlipidemia, unspecified: Secondary | ICD-10-CM

## 2019-06-21 DIAGNOSIS — I482 Chronic atrial fibrillation, unspecified: Secondary | ICD-10-CM

## 2019-06-21 DIAGNOSIS — N1832 Chronic kidney disease, stage 3b: Secondary | ICD-10-CM

## 2019-06-21 DIAGNOSIS — Z9181 History of falling: Secondary | ICD-10-CM

## 2019-06-21 DIAGNOSIS — I130 Hypertensive heart and chronic kidney disease with heart failure and stage 1 through stage 4 chronic kidney disease, or unspecified chronic kidney disease: Secondary | ICD-10-CM

## 2019-06-21 DIAGNOSIS — Z8673 Personal history of transient ischemic attack (TIA), and cerebral infarction without residual deficits: Secondary | ICD-10-CM

## 2019-06-21 DIAGNOSIS — Z7901 Long term (current) use of anticoagulants: Secondary | ICD-10-CM

## 2019-06-21 NOTE — Telephone Encounter (Signed)
Talked with Loma Sousa about [facility] still coming out to see pt. She is going to check with a local [facility] about getting pt tested for Covid. Also talked about setting up MyChart for future appts

## 2019-06-22 DIAGNOSIS — R4182 Altered mental status, unspecified: Secondary | ICD-10-CM | POA: Diagnosis not present

## 2019-06-22 DIAGNOSIS — Z20828 Contact with and (suspected) exposure to other viral communicable diseases: Secondary | ICD-10-CM | POA: Diagnosis not present

## 2019-06-27 DIAGNOSIS — E039 Hypothyroidism, unspecified: Secondary | ICD-10-CM | POA: Diagnosis not present

## 2019-06-27 DIAGNOSIS — I482 Chronic atrial fibrillation, unspecified: Secondary | ICD-10-CM | POA: Diagnosis not present

## 2019-06-27 DIAGNOSIS — J441 Chronic obstructive pulmonary disease with (acute) exacerbation: Secondary | ICD-10-CM | POA: Diagnosis not present

## 2019-06-27 DIAGNOSIS — I130 Hypertensive heart and chronic kidney disease with heart failure and stage 1 through stage 4 chronic kidney disease, or unspecified chronic kidney disease: Secondary | ICD-10-CM | POA: Diagnosis not present

## 2019-06-27 DIAGNOSIS — N1832 Chronic kidney disease, stage 3b: Secondary | ICD-10-CM | POA: Diagnosis not present

## 2019-06-27 DIAGNOSIS — I82402 Acute embolism and thrombosis of unspecified deep veins of left lower extremity: Secondary | ICD-10-CM | POA: Diagnosis not present

## 2019-06-27 DIAGNOSIS — N179 Acute kidney failure, unspecified: Secondary | ICD-10-CM | POA: Diagnosis not present

## 2019-06-27 DIAGNOSIS — I5022 Chronic systolic (congestive) heart failure: Secondary | ICD-10-CM | POA: Diagnosis not present

## 2019-06-27 DIAGNOSIS — F0150 Vascular dementia without behavioral disturbance: Secondary | ICD-10-CM | POA: Diagnosis not present

## 2019-06-28 DIAGNOSIS — F0150 Vascular dementia without behavioral disturbance: Secondary | ICD-10-CM | POA: Diagnosis not present

## 2019-06-28 DIAGNOSIS — I130 Hypertensive heart and chronic kidney disease with heart failure and stage 1 through stage 4 chronic kidney disease, or unspecified chronic kidney disease: Secondary | ICD-10-CM | POA: Diagnosis not present

## 2019-06-28 DIAGNOSIS — E039 Hypothyroidism, unspecified: Secondary | ICD-10-CM | POA: Diagnosis not present

## 2019-06-28 DIAGNOSIS — N179 Acute kidney failure, unspecified: Secondary | ICD-10-CM | POA: Diagnosis not present

## 2019-06-28 DIAGNOSIS — J441 Chronic obstructive pulmonary disease with (acute) exacerbation: Secondary | ICD-10-CM | POA: Diagnosis not present

## 2019-06-28 DIAGNOSIS — I482 Chronic atrial fibrillation, unspecified: Secondary | ICD-10-CM | POA: Diagnosis not present

## 2019-06-28 DIAGNOSIS — I82402 Acute embolism and thrombosis of unspecified deep veins of left lower extremity: Secondary | ICD-10-CM | POA: Diagnosis not present

## 2019-06-28 DIAGNOSIS — I5022 Chronic systolic (congestive) heart failure: Secondary | ICD-10-CM | POA: Diagnosis not present

## 2019-06-28 DIAGNOSIS — N1832 Chronic kidney disease, stage 3b: Secondary | ICD-10-CM | POA: Diagnosis not present

## 2019-07-05 DIAGNOSIS — E039 Hypothyroidism, unspecified: Secondary | ICD-10-CM | POA: Diagnosis not present

## 2019-07-05 DIAGNOSIS — I5022 Chronic systolic (congestive) heart failure: Secondary | ICD-10-CM | POA: Diagnosis not present

## 2019-07-05 DIAGNOSIS — N179 Acute kidney failure, unspecified: Secondary | ICD-10-CM | POA: Diagnosis not present

## 2019-07-05 DIAGNOSIS — I130 Hypertensive heart and chronic kidney disease with heart failure and stage 1 through stage 4 chronic kidney disease, or unspecified chronic kidney disease: Secondary | ICD-10-CM | POA: Diagnosis not present

## 2019-07-05 DIAGNOSIS — I482 Chronic atrial fibrillation, unspecified: Secondary | ICD-10-CM | POA: Diagnosis not present

## 2019-07-05 DIAGNOSIS — I82402 Acute embolism and thrombosis of unspecified deep veins of left lower extremity: Secondary | ICD-10-CM | POA: Diagnosis not present

## 2019-07-05 DIAGNOSIS — N1832 Chronic kidney disease, stage 3b: Secondary | ICD-10-CM | POA: Diagnosis not present

## 2019-07-05 DIAGNOSIS — F0150 Vascular dementia without behavioral disturbance: Secondary | ICD-10-CM | POA: Diagnosis not present

## 2019-07-05 DIAGNOSIS — J441 Chronic obstructive pulmonary disease with (acute) exacerbation: Secondary | ICD-10-CM | POA: Diagnosis not present

## 2019-07-12 DIAGNOSIS — I82402 Acute embolism and thrombosis of unspecified deep veins of left lower extremity: Secondary | ICD-10-CM | POA: Diagnosis not present

## 2019-07-12 DIAGNOSIS — I130 Hypertensive heart and chronic kidney disease with heart failure and stage 1 through stage 4 chronic kidney disease, or unspecified chronic kidney disease: Secondary | ICD-10-CM | POA: Diagnosis not present

## 2019-07-12 DIAGNOSIS — I482 Chronic atrial fibrillation, unspecified: Secondary | ICD-10-CM | POA: Diagnosis not present

## 2019-07-12 DIAGNOSIS — N179 Acute kidney failure, unspecified: Secondary | ICD-10-CM | POA: Diagnosis not present

## 2019-07-12 DIAGNOSIS — F0150 Vascular dementia without behavioral disturbance: Secondary | ICD-10-CM | POA: Diagnosis not present

## 2019-07-12 DIAGNOSIS — E039 Hypothyroidism, unspecified: Secondary | ICD-10-CM | POA: Diagnosis not present

## 2019-07-12 DIAGNOSIS — N1832 Chronic kidney disease, stage 3b: Secondary | ICD-10-CM | POA: Diagnosis not present

## 2019-07-12 DIAGNOSIS — J441 Chronic obstructive pulmonary disease with (acute) exacerbation: Secondary | ICD-10-CM | POA: Diagnosis not present

## 2019-07-12 DIAGNOSIS — I5022 Chronic systolic (congestive) heart failure: Secondary | ICD-10-CM | POA: Diagnosis not present

## 2019-07-15 ENCOUNTER — Other Ambulatory Visit (HOSPITAL_COMMUNITY): Payer: Self-pay | Admitting: Nephrology

## 2019-07-15 ENCOUNTER — Other Ambulatory Visit: Payer: Self-pay | Admitting: Nephrology

## 2019-07-15 DIAGNOSIS — I129 Hypertensive chronic kidney disease with stage 1 through stage 4 chronic kidney disease, or unspecified chronic kidney disease: Secondary | ICD-10-CM | POA: Diagnosis not present

## 2019-07-15 DIAGNOSIS — I4821 Permanent atrial fibrillation: Secondary | ICD-10-CM | POA: Diagnosis not present

## 2019-07-15 DIAGNOSIS — I5032 Chronic diastolic (congestive) heart failure: Secondary | ICD-10-CM | POA: Diagnosis not present

## 2019-07-15 DIAGNOSIS — E876 Hypokalemia: Secondary | ICD-10-CM | POA: Diagnosis not present

## 2019-07-15 DIAGNOSIS — N1832 Chronic kidney disease, stage 3b: Secondary | ICD-10-CM

## 2019-07-21 ENCOUNTER — Other Ambulatory Visit: Payer: Self-pay | Admitting: *Deleted

## 2019-07-21 DIAGNOSIS — I10 Essential (primary) hypertension: Secondary | ICD-10-CM

## 2019-07-21 DIAGNOSIS — I509 Heart failure, unspecified: Secondary | ICD-10-CM

## 2019-07-21 MED ORDER — HYDRALAZINE HCL 100 MG PO TABS: 100.0000 mg | ORAL_TABLET | Freq: Three times a day (TID) | ORAL | 1 refills | Status: DC

## 2019-07-21 MED ORDER — LOSARTAN POTASSIUM 100 MG PO TABS: 100.0000 mg | ORAL_TABLET | Freq: Every day | ORAL | 1 refills | Status: DC

## 2019-07-21 NOTE — Addendum Note (Signed)
Addended by: [name] on: 07/21/2019 05:13 PM   Modules accepted: Orders

## 2019-07-21 NOTE — Telephone Encounter (Signed)
I discontinued losartan 25 mg oof her med list.  According to the cardiology note on 06/02/2019 patient is on losartan 100 mg once daily and hydralazine 100 mg 3 times daily.

## 2019-07-21 NOTE — Telephone Encounter (Signed)
Refill request for Losartan Potassium 25mg  Last rx'd by MD Pt also on losartan 100mg  Ok to refill?

## 2019-07-22 ENCOUNTER — Ambulatory Visit (HOSPITAL_COMMUNITY)
Admission: RE | Admit: 2019-07-22 | Discharge: 2019-07-22 | Disposition: A | Discharge status: Home / Self Care | Payer: Medicare HMO | Source: Ambulatory Visit | Attending: Nephrology | Admitting: Nephrology

## 2019-07-22 ENCOUNTER — Other Ambulatory Visit: Payer: Self-pay

## 2019-07-22 ENCOUNTER — Other Ambulatory Visit (HOSPITAL_COMMUNITY)
Admission: RE | Admit: 2019-07-22 | Discharge: 2019-07-22 | Disposition: A | Discharge status: Home / Self Care | Payer: Medicare HMO | Source: Ambulatory Visit | Attending: Nephrology | Admitting: Nephrology

## 2019-07-22 ENCOUNTER — Telehealth: Payer: Self-pay | Admitting: *Deleted

## 2019-07-22 DIAGNOSIS — N183 Chronic kidney disease, stage 3 unspecified: Secondary | ICD-10-CM | POA: Diagnosis not present

## 2019-07-22 DIAGNOSIS — I130 Hypertensive heart and chronic kidney disease with heart failure and stage 1 through stage 4 chronic kidney disease, or unspecified chronic kidney disease: Secondary | ICD-10-CM | POA: Diagnosis not present

## 2019-07-22 DIAGNOSIS — I5032 Chronic diastolic (congestive) heart failure: Secondary | ICD-10-CM | POA: Diagnosis not present

## 2019-07-22 DIAGNOSIS — N281 Cyst of kidney, acquired: Secondary | ICD-10-CM | POA: Diagnosis not present

## 2019-07-22 DIAGNOSIS — N1832 Chronic kidney disease, stage 3b: Secondary | ICD-10-CM | POA: Insufficient documentation

## 2019-07-22 DIAGNOSIS — I4821 Permanent atrial fibrillation: Secondary | ICD-10-CM | POA: Insufficient documentation

## 2019-07-22 DIAGNOSIS — E876 Hypokalemia: Secondary | ICD-10-CM | POA: Diagnosis not present

## 2019-07-22 LAB — COMPREHENSIVE METABOLIC PANEL
ALT: 20 U/L (ref 0–44)
AST: 24 U/L (ref 15–41)
Albumin: 3.6 g/dL (ref 3.5–5.0)
Alkaline Phosphatase: 65 U/L (ref 38–126)
Anion gap: 9 (ref 5–15)
BUN: 18 mg/dL (ref 8–23)
CO2: 26 mmol/L (ref 22–32)
Calcium: 8.8 mg/dL — ABNORMAL LOW (ref 8.9–10.3)
Chloride: 104 mmol/L (ref 98–111)
Creatinine, Ser: 0.91 mg/dL (ref 0.44–1.00)
GFR calc Af Amer: >60 mL/min (ref >60)
GFR calc non Af Amer: 59 mL/min — ABNORMAL LOW (ref >60)
Glucose, Bld: 119 mg/dL — ABNORMAL HIGH (ref 70–99)
Potassium: 3.3 mmol/L — ABNORMAL LOW (ref 3.5–5.1)
Sodium: 139 mmol/L (ref 135–145)
Total Bilirubin: 0.9 mg/dL (ref 0.3–1.2)
Total Protein: 6.4 g/dL — ABNORMAL LOW (ref 6.5–8.1)

## 2019-07-22 LAB — CBC WITH DIFFERENTIAL/PLATELET
Abs Immature Granulocytes: 0.01 10*3/uL (ref 0.00–0.07)
Basophils Absolute: 0 10*3/uL (ref 0.0–0.1)
Basophils Relative: 1 %
Eosinophils Absolute: 0.3 10*3/uL (ref 0.0–0.5)
Eosinophils Relative: 4 %
HCT: 37.9 % (ref 36.0–46.0)
Hemoglobin: 12.5 g/dL (ref 12.0–15.0)
Immature Granulocytes: 0 %
Lymphocytes Relative: 22 %
Lymphs Abs: 1.4 10*3/uL (ref 0.7–4.0)
MCH: 31.8 pg (ref 26.0–34.0)
MCHC: 33 g/dL (ref 30.0–36.0)
MCV: 96.4 fL (ref 80.0–100.0)
Monocytes Absolute: 0.4 10*3/uL (ref 0.1–1.0)
Monocytes Relative: 7 %
Neutro Abs: 4.1 10*3/uL (ref 1.7–7.7)
Neutrophils Relative %: 66 %
Platelets: 261 10*3/uL (ref 150–400)
RBC: 3.93 MIL/uL (ref 3.87–5.11)
RDW: 13.1 % (ref 11.5–15.5)
WBC: 6.2 10*3/uL (ref 4.0–10.5)
nRBC: 0 % (ref 0.0–0.2)

## 2019-07-22 LAB — PROTEIN, URINE, 24 HOUR
Collection Interval-UPROT: 24 hours
Protein, Urine: 8 mg/dL
Urine Total Volume-UPROT: 125 mL

## 2019-07-22 LAB — VITAMIN D 25 HYDROXY (VIT D DEFICIENCY, FRACTURES): Vit D, 25-Hydroxy: 30.5 ng/mL (ref 30–100)

## 2019-07-22 LAB — HEMOGLOBIN A1C
Hgb A1c MFr Bld: 5.5 % (ref 4.8–5.6)
Mean Plasma Glucose: 111.15 mg/dL

## 2019-07-22 LAB — HEPATITIS B SURFACE ANTIGEN: Hepatitis B Surface Ag: NONREACTIVE

## 2019-07-22 LAB — IRON AND TIBC
Iron: 120 ug/dL (ref 28–170)
Saturation Ratios: 39 % — ABNORMAL HIGH (ref 10.4–31.8)
TIBC: 308 ug/dL (ref 250–450)
UIBC: 188 ug/dL

## 2019-07-22 LAB — CREATININE CLEARANCE, URINE, 24 HOUR
Collection Interval-CRCL: 24 hours
Creatinine Clearance: 15 mL/min — ABNORMAL LOW (ref 75–115)
Creatinine, 24H Ur: 192 mg/d — ABNORMAL LOW (ref 600–1800)
Creatinine, Urine: 153.58 mg/dL
Urine Total Volume-CRCL: 125 mL

## 2019-07-22 LAB — URIC ACID: Uric Acid, Serum: 4.8 mg/dL (ref 2.5–7.1)

## 2019-07-22 LAB — PHOSPHORUS: Phosphorus: 2.9 mg/dL (ref 2.5–4.6)

## 2019-07-22 LAB — FERRITIN: Ferritin: 118 ng/mL (ref 11–307)

## 2019-07-22 LAB — HEPATITIS C ANTIBODY: HCV Ab: NONREACTIVE

## 2019-07-22 LAB — MAGNESIUM: Magnesium: 2 mg/dL (ref 1.7–2.4)

## 2019-07-22 LAB — VITAMIN B12: Vitamin B-12: 620 pg/mL (ref 180–914)

## 2019-07-22 NOTE — Telephone Encounter (Signed)
[facility] pharmacy called and said a losartan 100mg  refill was sent. Pts husband said this was changed to losartan 25mg . They are wanting clarification

## 2019-07-22 NOTE — Telephone Encounter (Signed)
Ok, I clarified that to the pharmacy. But, look under the discharge summary all the way at the bottom, it list both.

## 2019-07-22 NOTE — Telephone Encounter (Signed)
Sorry, but I read the last discharge summary and it looks like she should be on both losartan 100 and losartan 25. If so, she needs a new rx for 25mg 

## 2019-07-22 NOTE — Telephone Encounter (Signed)
Contacted [employer] with order clarification, per Deliah Boston, FNP it should be losartan 100mg  qd

## 2019-07-22 NOTE — Telephone Encounter (Signed)
Upon further review of discharge summary and cardiology notes I still only see where they documented losartan 100 mg once daily. I do see both 100 mg and 25 mg tablets on patients discharge medication list but nowhere does it mention that she should be on both. 100 mg is the maximum amount to be prescribed. We did call her cardiologist and clarify and they too verified she should be on losartan 100 mg once daily.

## 2019-07-22 NOTE — Telephone Encounter (Signed)
I am not sure who would have change this to 25 mg.  Patient was recently hospitalized which is where losartan was increased to 100 mg once daily.  She has seen her cardiologist since then as well whose recommendation was to continue losartan 100 mg once daily.  I have not seen her since then.

## 2019-07-23 DIAGNOSIS — R4182 Altered mental status, unspecified: Secondary | ICD-10-CM | POA: Diagnosis not present

## 2019-07-23 LAB — PTH, INTACT AND CALCIUM
Calcium, Total (PTH): 9 mg/dL (ref 8.7–10.3)
PTH: 22 pg/mL (ref 15–65)

## 2019-07-23 LAB — C4 COMPLEMENT: Complement C4, Body Fluid: 25 mg/dL (ref 12–38)

## 2019-07-23 LAB — C3 COMPLEMENT: C3 Complement: 127 mg/dL (ref 82–167)

## 2019-07-23 LAB — ANA: Anti Nuclear Antibody (ANA): NEGATIVE

## 2019-07-25 LAB — PROTEIN ELECTROPHORESIS, SERUM
A/G Ratio: 1.3 (ref 0.7–1.7)
Albumin ELP: 3.3 g/dL (ref 2.9–4.4)
Alpha-1-Globulin: 0.3 g/dL (ref 0.0–0.4)
Alpha-2-Globulin: 0.6 g/dL (ref 0.4–1.0)
Beta Globulin: 1 g/dL (ref 0.7–1.3)
Gamma Globulin: 0.7 g/dL (ref 0.4–1.8)
Globulin, Total: 2.6 g/dL (ref 2.2–3.9)
Total Protein ELP: 5.9 g/dL — ABNORMAL LOW (ref 6.0–8.5)

## 2019-07-25 LAB — GLOMERULAR BASEMENT MEMBRANE ANTIBODIES: GBM Ab: 6 units (ref 0–20)

## 2019-07-25 LAB — KAPPA/LAMBDA LIGHT CHAINS
Kappa free light chain: 21.2 mg/L — ABNORMAL HIGH (ref 3.3–19.4)
Kappa, lambda light chain ratio: 1.38 (ref 0.26–1.65)
Lambda free light chains: 15.4 mg/L (ref 5.7–26.3)

## 2019-07-25 LAB — ANCA TITERS
Atypical P-ANCA titer: <1:20 {titer}
C-ANCA: <1:20 {titer}
P-ANCA: <1:20 {titer}

## 2019-07-26 LAB — MISC LABCORP TEST (SEND OUT): Labcorp test code: 141330

## 2019-07-27 LAB — HIV-1/2 AB - DIFFERENTIATION
HIV 1 Ab: NEGATIVE
HIV 2 Ab: NEGATIVE
Note: NEGATIVE

## 2019-07-27 LAB — RNA QUALITATIVE: HIV 1 RNA Qualitative: 1

## 2019-07-28 LAB — UIFE/LIGHT CHAINS/TP QN, 24-HR UR
FR KAPPA LT CH,24HR: 1.27 mg/24 hr
FR LAMBDA LT CH,24HR: 0.09 mg/24 hr
Free Kappa Lt Chains,Ur: 10.17 mg/L (ref 0.63–113.79)
Free Kappa/Lambda Ratio: 14.32 (ref 1.03–31.76)
Free Lambda Lt Chains,Ur: 0.71 mg/L (ref 0.47–11.77)
Total Protein, Urine-Ur/day: 14 mg/24 hr — ABNORMAL LOW (ref 30–150)
Total Protein, Urine: 10.9 mg/dL
Total Volume: 125

## 2019-07-29 LAB — HEPATITIS C GENOTYPE

## 2019-08-08 ENCOUNTER — Other Ambulatory Visit: Payer: Self-pay | Admitting: Family Medicine

## 2019-08-08 MED ORDER — FUROSEMIDE 40 MG PO TABS: 40.0000 mg | ORAL_TABLET | Freq: Every day | ORAL | 1 refills | Status: DC

## 2019-08-17 ENCOUNTER — Other Ambulatory Visit: Payer: Self-pay | Admitting: *Deleted

## 2019-08-17 DIAGNOSIS — F0150 Vascular dementia without behavioral disturbance: Secondary | ICD-10-CM

## 2019-08-17 MED ORDER — NAMZARIC 28-10 MG PO CP24: 1.0000 | ORAL_CAPSULE | Freq: Every day | ORAL | 1 refills | Status: DC

## 2019-08-17 NOTE — Telephone Encounter (Signed)
Patient needs routine follow-up scheduled.

## 2019-08-21 DIAGNOSIS — R4182 Altered mental status, unspecified: Secondary | ICD-10-CM | POA: Diagnosis not present

## 2019-09-02 DIAGNOSIS — E876 Hypokalemia: Secondary | ICD-10-CM | POA: Diagnosis not present

## 2019-09-02 DIAGNOSIS — I129 Hypertensive chronic kidney disease with stage 1 through stage 4 chronic kidney disease, or unspecified chronic kidney disease: Secondary | ICD-10-CM | POA: Diagnosis not present

## 2019-09-02 DIAGNOSIS — Z79899 Other long term (current) drug therapy: Secondary | ICD-10-CM | POA: Diagnosis not present

## 2019-09-02 DIAGNOSIS — I4821 Permanent atrial fibrillation: Secondary | ICD-10-CM | POA: Diagnosis not present

## 2019-09-02 DIAGNOSIS — N170 Acute kidney failure with tubular necrosis: Secondary | ICD-10-CM | POA: Diagnosis not present

## 2019-09-02 DIAGNOSIS — N1831 Chronic kidney disease, stage 3a: Secondary | ICD-10-CM | POA: Diagnosis not present

## 2019-09-02 DIAGNOSIS — I5032 Chronic diastolic (congestive) heart failure: Secondary | ICD-10-CM | POA: Diagnosis not present

## 2019-09-09 ENCOUNTER — Other Ambulatory Visit: Payer: Medicare HMO

## 2019-09-09 ENCOUNTER — Other Ambulatory Visit: Payer: Self-pay

## 2019-09-09 DIAGNOSIS — E875 Hyperkalemia: Secondary | ICD-10-CM | POA: Diagnosis not present

## 2019-09-15 ENCOUNTER — Other Ambulatory Visit: Payer: Self-pay | Admitting: Family Medicine

## 2019-09-15 ENCOUNTER — Telehealth: Payer: Self-pay | Admitting: Family Medicine

## 2019-09-15 DIAGNOSIS — I509 Heart failure, unspecified: Secondary | ICD-10-CM

## 2019-09-15 DIAGNOSIS — I10 Essential (primary) hypertension: Secondary | ICD-10-CM

## 2019-09-15 MED ORDER — POTASSIUM CHLORIDE CRYS ER 20 MEQ PO TBCR: 20.0000 meq | EXTENDED_RELEASE_TABLET | Freq: Every day | ORAL | 0 refills | Status: DC

## 2019-09-15 NOTE — Telephone Encounter (Signed)
  Prescription Request  09/15/2019  What is the name of the medication or equipment? Potassium  Have you contacted your pharmacy to request a refill? (if applicable) Yes  Which pharmacy would you like this sent to? [facility]  Patients son called requesting a refill on pt's Potassium Rx ASAP. Explained to pts son that the Rx was denied for refill because Britney wants pt to schedule an appt. Son explained that it is hard for them to get pt to appts based on her health and their work schedules. Requested that Britney send in refill for Potassium without an appt.   Pt recently had lab work done on 09/09/19.   Patient notified that their request is being sent to the clinical staff for review and that they should receive a response within 2 business days.

## 2019-09-15 NOTE — Telephone Encounter (Signed)
No answer, no voicemail.

## 2019-09-15 NOTE — Telephone Encounter (Signed)
Attempted to contact - NA 

## 2019-09-15 NOTE — Telephone Encounter (Signed)
I can give a one month supply but they need to schedule an appointment, even if it is a telephone appointment.

## 2019-09-19 ENCOUNTER — Other Ambulatory Visit: Payer: Self-pay | Admitting: Family Medicine

## 2019-09-20 DIAGNOSIS — R4182 Altered mental status, unspecified: Secondary | ICD-10-CM | POA: Diagnosis not present

## 2019-09-20 NOTE — Telephone Encounter (Signed)
I can give a one month supply but they need to schedule an appointment, even if it is a telephone appointment.  

## 2019-09-22 NOTE — Telephone Encounter (Signed)
Lmtcb- no call back. This encounter will be closed.

## 2019-09-26 ENCOUNTER — Other Ambulatory Visit: Payer: Self-pay | Admitting: Family Medicine

## 2019-10-03 ENCOUNTER — Other Ambulatory Visit: Payer: Self-pay | Admitting: Family Medicine

## 2019-10-03 DIAGNOSIS — I509 Heart failure, unspecified: Secondary | ICD-10-CM

## 2019-10-03 DIAGNOSIS — I10 Essential (primary) hypertension: Secondary | ICD-10-CM

## 2019-10-03 NOTE — Telephone Encounter (Signed)
Husband aware.

## 2019-10-03 NOTE — Telephone Encounter (Signed)
Wanda Mckenzie. NTBS 30 days given 09/15/19

## 2019-10-21 DIAGNOSIS — R4182 Altered mental status, unspecified: Secondary | ICD-10-CM | POA: Diagnosis not present

## 2019-11-20 DIAGNOSIS — R4182 Altered mental status, unspecified: Secondary | ICD-10-CM | POA: Diagnosis not present

## 2019-11-22 ENCOUNTER — Other Ambulatory Visit: Payer: Self-pay | Admitting: Family Medicine

## 2019-11-22 DIAGNOSIS — I10 Essential (primary) hypertension: Secondary | ICD-10-CM

## 2019-11-22 DIAGNOSIS — I509 Heart failure, unspecified: Secondary | ICD-10-CM

## 2019-11-28 ENCOUNTER — Other Ambulatory Visit: Payer: Self-pay | Admitting: Family Medicine

## 2019-11-28 NOTE — Telephone Encounter (Signed)
Alona Bene. NTBS 30 days given 11/02/19

## 2019-11-29 NOTE — Telephone Encounter (Signed)
Patient has apt 12/02/19.

## 2019-12-02 ENCOUNTER — Encounter: Payer: Self-pay | Admitting: Family Medicine

## 2019-12-02 ENCOUNTER — Ambulatory Visit (INDEPENDENT_AMBULATORY_CARE_PROVIDER_SITE_OTHER): Payer: Medicare HMO | Admitting: Family Medicine

## 2019-12-02 ENCOUNTER — Other Ambulatory Visit: Payer: Self-pay

## 2019-12-02 VITALS — BP 164/76 | HR 72 | Temp 96.7°F

## 2019-12-02 DIAGNOSIS — F0150 Vascular dementia without behavioral disturbance: Secondary | ICD-10-CM | POA: Diagnosis not present

## 2019-12-02 DIAGNOSIS — R7989 Other specified abnormal findings of blood chemistry: Secondary | ICD-10-CM

## 2019-12-02 DIAGNOSIS — I509 Heart failure, unspecified: Secondary | ICD-10-CM

## 2019-12-02 DIAGNOSIS — I482 Chronic atrial fibrillation, unspecified: Secondary | ICD-10-CM | POA: Diagnosis not present

## 2019-12-02 DIAGNOSIS — N1832 Chronic kidney disease, stage 3b: Secondary | ICD-10-CM

## 2019-12-02 DIAGNOSIS — Z8673 Personal history of transient ischemic attack (TIA), and cerebral infarction without residual deficits: Secondary | ICD-10-CM | POA: Diagnosis not present

## 2019-12-02 DIAGNOSIS — I10 Essential (primary) hypertension: Secondary | ICD-10-CM

## 2019-12-02 DIAGNOSIS — I82402 Acute embolism and thrombosis of unspecified deep veins of left lower extremity: Secondary | ICD-10-CM | POA: Diagnosis not present

## 2019-12-02 DIAGNOSIS — E782 Mixed hyperlipidemia: Secondary | ICD-10-CM | POA: Diagnosis not present

## 2019-12-02 DIAGNOSIS — J449 Chronic obstructive pulmonary disease, unspecified: Secondary | ICD-10-CM

## 2019-12-02 MED ORDER — FUROSEMIDE 40 MG PO TABS: 40.0000 mg | ORAL_TABLET | Freq: Every day | ORAL | 1 refills | Status: DC

## 2019-12-02 MED ORDER — APIXABAN 5 MG PO TABS: 5.0000 mg | ORAL_TABLET | Freq: Two times a day (BID) | ORAL | 1 refills | Status: DC

## 2019-12-02 MED ORDER — NAMZARIC 28-10 MG PO CP24: 1.0000 | ORAL_CAPSULE | Freq: Every day | ORAL | 1 refills | Status: DC

## 2019-12-02 MED ORDER — POTASSIUM CHLORIDE ER 20 MEQ PO TBCR: 2.0000 | EXTENDED_RELEASE_TABLET | Freq: Every day | ORAL | 1 refills | Status: DC

## 2019-12-02 MED ORDER — LOSARTAN POTASSIUM 100 MG PO TABS: 100.0000 mg | ORAL_TABLET | Freq: Every day | ORAL | 1 refills | Status: DC

## 2019-12-02 MED ORDER — ATORVASTATIN CALCIUM 20 MG PO TABS: 20.0000 mg | ORAL_TABLET | Freq: Every day | ORAL | 1 refills | Status: DC

## 2019-12-02 MED ORDER — HYDRALAZINE HCL 100 MG PO TABS: 100.0000 mg | ORAL_TABLET | Freq: Three times a day (TID) | ORAL | 1 refills | Status: DC

## 2019-12-02 MED ORDER — LEVOTHYROXINE SODIUM 25 MCG PO TABS: ORAL_TABLET | ORAL | 1 refills | Status: DC

## 2019-12-02 NOTE — Progress Notes (Signed)
Assessment & Plan:  1. Essential hypertension - losartan (COZAAR) 100 MG tablet; Take 1 tablet (100 mg total) by mouth daily.  Dispense: 90 tablet; Refill: 1 - Potassium Chloride ER 20 MEQ TBCR; Take 2 tablets by mouth daily.  Dispense: 180 tablet; Refill: 1 - CBC with Differential/Platelet - CMP14+EGFR  2. Chronic congestive heart failure, unspecified heart failure type ([place]) - Well controlled on current regimen.  - losartan (COZAAR) 100 MG tablet; Take 1 tablet (100 mg total) by mouth daily.  Dispense: 90 tablet; Refill: 1 - furosemide (LASIX) 40 MG tablet; Take 1 tablet (40 mg total) by mouth daily.  Dispense: 90 tablet; Refill: 1 - hydrALAZINE (APRESOLINE) 100 MG tablet; Take 1 tablet (100 mg total) by mouth every 8 (eight) hours.  Dispense: 270 tablet; Refill: 1 - Potassium Chloride ER 20 MEQ TBCR; Take 2 tablets by mouth daily.  Dispense: 180 tablet; Refill: 1 - CBC with Differential/Platelet - CMP14+EGFR  3. Chronic atrial fibrillation (HCC) - Well controlled on current regimen.   4. Deep vein thrombosis (DVT) of left lower extremity, unspecified chronicity, unspecified vein (HCC) - Well controlled on current regimen.  - apixaban (ELIQUIS) 5 MG TABS tablet; Take 1 tablet (5 mg total) by mouth 2 (two) times daily.  Dispense: 180 tablet; Refill: 1 - CBC with Differential/Platelet  5. Mixed hyperlipidemia - Well controlled on current regimen.  - atorvastatin (LIPITOR) 20 MG tablet; Take 1 tablet (20 mg total) by mouth at bedtime.  Dispense: 90 tablet; Refill: 1 - CMP14+EGFR  6. History of stroke - atorvastatin (LIPITOR) 20 MG tablet; Take 1 tablet (20 mg total) by mouth at bedtime.  Dispense: 90 tablet; Refill: 1 - apixaban (ELIQUIS) 5 MG TABS tablet; Take 1 tablet (5 mg total) by mouth 2 (two) times daily.  Dispense: 180 tablet; Refill: 1 - CBC with Differential/Platelet  7. Vascular dementia without behavioral disturbance ([place]) - Well controlled on current regimen.  -  Memantine HCl-Donepezil HCl (NAMZARIC) 28-10 MG CP24; Take 1 capsule by mouth at bedtime.  Dispense: 90 capsule; Refill: 1  8. Elevated TSH - Well controlled on current regimen.  - levothyroxine (SYNTHROID) 25 MCG tablet; TAKE (1/2) TABLET DAILY.  Dispense: 45 tablet; Refill: 1 - Thyroid Panel With TSH  9. Stage 3b chronic kidney disease - Managed by nephrology.  10. Chronic obstructive pulmonary disease, unspecified COPD type ([place]) - Well controlled on current regimen.    Return as directed after labs result.  Hendricks Limes, MSN, APRN, FNP-C [facility]  Subjective:    Patient ID: Wanda Mckenzie, female    DOB: 05-29-1936, 84 y.o.   MRN: [id number]  Patient Care Team: Loman Brooklyn, FNP as PCP - General (Family Medicine) Satira Sark, MD as PCP - Cardiology (Cardiology)   Chief Complaint:  Chief Complaint  Patient presents with  . chronic atrial fib    check up of chronic medical conditions    HPI: Wanda Mckenzie is a 84 y.o. female presenting on 12/02/2019 for chronic atrial fib (check up of chronic medical conditions)  Patient is accompanied by her exdaughter-in-law who she is okay with being present.  She is her caregiver on Fridays.  Patient is doing well at home.  Her caregiver reports she does eat a small breakfast but she eats really good for lunch and dinner.  She is seen nephrology for CKD.  She was hospitalized since the last time she was here for an office visit at [facility]  due to AMS.  Patient did have a left lower extremity DVT despite being on Coumadin for atrial fibrillation.  She was switched over to Eliquis.  It was recommended she be discharged to a skilled nursing facility but family declined.  New complaints: None  Social history:  Relevant past medical, surgical, family and social history reviewed and updated as indicated. Interim medical history since our last visit reviewed.  Allergies and medications  reviewed and updated.  DATA REVIEWED: CHART IN EPIC  ROS: Negative unless specifically indicated above in HPI.    Current Outpatient Medications:  .  acetaminophen (TYLENOL) 500 MG tablet, Take 1,000 mg by mouth every 6 (six) hours as needed for headache (pain)., Disp: , Rfl:  .  albuterol (VENTOLIN HFA) 108 (90 Base) MCG/ACT inhaler, Inhale 2 puffs into the lungs every 6 (six) hours as needed for wheezing or shortness of breath., Disp: 18 g, Rfl: 2 .  apixaban (ELIQUIS) 5 MG TABS tablet, Take 1 tablet (5 mg total) by mouth 2 (two) times daily. Needs to be seen before next refills., Disp: 60 tablet, Rfl: 0 .  atorvastatin (LIPITOR) 20 MG tablet, Take 1 tablet (20 mg total) by mouth daily. (Patient taking differently: Take 20 mg by mouth at bedtime. ), Disp: 90 tablet, Rfl: 3 .  furosemide (LASIX) 40 MG tablet, Take 1 tablet (40 mg total) by mouth daily., Disp: 90 tablet, Rfl: 1 .  hydrALAZINE (APRESOLINE) 100 MG tablet, Take 1 tablet (100 mg total) by mouth every 8 (eight) hours., Disp: 270 tablet, Rfl: 1 .  levothyroxine (SYNTHROID) 25 MCG tablet, TAKE (1/2) TABLET DAILY., Disp: 15 tablet, Rfl: 0 .  losartan (COZAAR) 100 MG tablet, Take 1 tablet (100 mg total) by mouth daily., Disp: 90 tablet, Rfl: 1 .  Memantine HCl-Donepezil HCl (NAMZARIC) 28-10 MG CP24, Take 1 capsule by mouth at bedtime., Disp: 90 capsule, Rfl: 1 .  metoprolol succinate (TOPROL XL) 50 MG 24 hr tablet, Take 1 tablet (50 mg total) by mouth daily., Disp: 30 tablet, Rfl: 6 .  Potassium Chloride ER 20 MEQ TBCR, TAKE 1 TABLET DAILY., Disp: 30 tablet, Rfl: 0 .  Spacer/Aero-Holding Chambers (AEROCHAMBER PLUS) inhaler, Use as instructed, Disp: 1 each, Rfl: 2   Allergies  Allergen Reactions  . Codeine     REACTION: vomiting   Past Medical History:  Diagnosis Date  . Atrial fibrillation ([place])   . COPD (chronic obstructive pulmonary disease) ([place])   . Dyslipidemia   . Essential hypertension   . History of cardiac  catheterization 11/2013   Normal coronaries  . History of stroke    Right MCA distribution     Past Surgical History:  Procedure Laterality Date  . ABDOMINAL HYSTERECTOMY  1975  . FRACTURE SURGERY Left   . LEFT HEART CATHETERIZATION WITH CORONARY ANGIOGRAM N/A 11/23/2013   Procedure: LEFT HEART CATHETERIZATION WITH CORONARY ANGIOGRAM;  Surgeon: Burnell Blanks, MD;  Location[facility];  Service: Cardiovascular;  Laterality: N/A;  . LEFT HIP HEMIARTHROPLASTY    . TRIBUTARY VARICOSITIES OF RIGHT LEG  05/23/2002  . VEIN LIGATION AND STRIPPING Right 02/08/2013   Procedure: Segmental excision of painful varicose veins right lower extremity;  Surgeon: Angelia Mould, MD;  Location: [facility] OR;  Service: Vascular;  Laterality: Right;    Social History   Socioeconomic History  . Marital status: Married    Spouse name: Not on file  . Number of children: Not on file  . Years of education: Not on  file  . Highest education level: Not on file  Occupational History  . Not on file  Tobacco Use  . Smoking status: Never Smoker  . Smokeless tobacco: Never Used  Vaping Use  . Vaping Use: Never used  Substance and Sexual Activity  . Alcohol use: No    Alcohol/week: 0.0 standard drinks  . Drug use: No  . Sexual activity: Not Currently  Other Topics Concern  . Not on file  Social History Narrative  . Not on file   Social Determinants of Health   Financial Resource Strain:   . Difficulty of Paying Living Expenses:   Food Insecurity:   . Worried About Charity fundraiser in the Last Year:   . Arboriculturist in the Last Year:   Transportation Needs:   . Film/video editor (Medical):   Marland Kitchen Lack of Transportation (Non-Medical):   Physical Activity:   . Days of Exercise per Week:   . Minutes of Exercise per Session:   Stress:   . Feeling of Stress :   Social Connections:   . Frequency of Communication with Friends and Family:   . Frequency of Social Gatherings with Friends  and Family:   . Attends Religious Services:   . Active Member of Clubs or Organizations:   . Attends Archivist Meetings:   Marland Kitchen Marital Status:   Intimate Partner Violence:   . Fear of Current or Ex-Partner:   . Emotionally Abused:   Marland Kitchen Physically Abused:   . Sexually Abused:         Objective:    BP (!) 164/76   Pulse 72   Temp (!) 96.7 F (35.9 C) (Temporal)   SpO2 94%   Wt Readings from Last 3 Encounters:  06/02/19 179 lb (81.2 kg)  05/25/19 169 lb 5 oz (76.8 kg)  04/29/19 175 lb 3.2 oz (79.5 kg)    Physical Exam Vitals reviewed.  Constitutional:      General: She is not in acute distress.    Appearance: Normal appearance. She is not ill-appearing, toxic-appearing or diaphoretic.  HENT:     Head: Normocephalic and atraumatic.  Eyes:     General: No scleral icterus.       Right eye: No discharge.        Left eye: No discharge.     Conjunctiva/sclera: Conjunctivae normal.  Cardiovascular:     Rate and Rhythm: Normal rate and regular rhythm.     Heart sounds: Normal heart sounds. No murmur heard.  No friction rub. No gallop.   Pulmonary:     Effort: Pulmonary effort is normal. No respiratory distress.     Breath sounds: Normal breath sounds. No stridor. No wheezing, rhonchi or rales.  Musculoskeletal:        General: Normal range of motion.     Cervical back: Normal range of motion.  Skin:    General: Skin is warm and dry.     Capillary Refill: Capillary refill takes less than 2 seconds.  Neurological:     General: No focal deficit present.     Mental Status: She is alert and oriented to person, place, and time. Mental status is at baseline.  Psychiatric:        Mood and Affect: Mood normal.        Behavior: Behavior normal.        Thought Content: Thought content normal.        Judgment: Judgment normal.  Lab Results  Component Value Date   TSH 3.760 12/02/2019   Lab Results  Component Value Date   WBC 5.9 12/02/2019   HGB 13.1  12/02/2019   HCT 40.1 12/02/2019   MCV 95 12/02/2019   PLT 237 12/02/2019   Lab Results  Component Value Date   NA 147 (H) 12/02/2019   K 3.3 (L) 12/02/2019   CO2 26 12/02/2019   GLUCOSE 136 (H) 12/02/2019   BUN 16 12/02/2019   CREATININE 0.93 12/02/2019   BILITOT 0.9 12/02/2019   ALKPHOS 87 12/02/2019   AST 23 12/02/2019   ALT 17 12/02/2019   PROT 6.6 12/02/2019   ALBUMIN 4.1 12/02/2019   CALCIUM 9.1 12/02/2019   ANIONGAP 9 07/22/2019   Lab Results  Component Value Date   CHOL 126 05/15/2019   Lab Results  Component Value Date   HDL 28 (L) 05/15/2019   Lab Results  Component Value Date   LDLCALC 85 05/15/2019   Lab Results  Component Value Date   TRIG 64 05/15/2019   Lab Results  Component Value Date   CHOLHDL 4.5 05/15/2019   Lab Results  Component Value Date   HGBA1C 5.5 07/22/2019

## 2019-12-03 LAB — CBC WITH DIFFERENTIAL/PLATELET
Basophils Absolute: 0.1 10*3/uL (ref 0.0–0.2)
Basos: 1 %
EOS (ABSOLUTE): 0.2 10*3/uL (ref 0.0–0.4)
Eos: 4 %
Hematocrit: 40.1 % (ref 34.0–46.6)
Hemoglobin: 13.1 g/dL (ref 11.1–15.9)
Immature Grans (Abs): 0 10*3/uL (ref 0.0–0.1)
Immature Granulocytes: 0 %
Lymphocytes Absolute: 1.7 10*3/uL (ref 0.7–3.1)
Lymphs: 29 %
MCH: 31 pg (ref 26.6–33.0)
MCHC: 32.7 g/dL (ref 31.5–35.7)
MCV: 95 fL (ref 79–97)
Monocytes Absolute: 0.4 10*3/uL (ref 0.1–0.9)
Monocytes: 6 %
Neutrophils Absolute: 3.6 10*3/uL (ref 1.4–7.0)
Neutrophils: 60 %
Platelets: 237 10*3/uL (ref 150–450)
RBC: 4.22 x10E6/uL (ref 3.77–5.28)
RDW: 12.1 % (ref 11.7–15.4)
WBC: 5.9 10*3/uL (ref 3.4–10.8)

## 2019-12-03 LAB — CMP14+EGFR
ALT: 17 IU/L (ref 0–32)
AST: 23 IU/L (ref 0–40)
Albumin/Globulin Ratio: 1.6 (ref 1.2–2.2)
Albumin: 4.1 g/dL (ref 3.6–4.6)
Alkaline Phosphatase: 87 IU/L (ref 48–121)
BUN/Creatinine Ratio: 17 (ref 12–28)
BUN: 16 mg/dL (ref 8–27)
Bilirubin Total: 0.9 mg/dL (ref 0.0–1.2)
CO2: 26 mmol/L (ref 20–29)
Calcium: 9.1 mg/dL (ref 8.7–10.3)
Chloride: 110 mmol/L — ABNORMAL HIGH (ref 96–106)
Creatinine, Ser: 0.93 mg/dL (ref 0.57–1.00)
GFR calc Af Amer: 67 mL/min/{1.73_m2} (ref >59)
GFR calc non Af Amer: 58 mL/min/{1.73_m2} — ABNORMAL LOW (ref >59)
Globulin, Total: 2.5 g/dL (ref 1.5–4.5)
Glucose: 136 mg/dL — ABNORMAL HIGH (ref 65–99)
Potassium: 3.3 mmol/L — ABNORMAL LOW (ref 3.5–5.2)
Sodium: 147 mmol/L — ABNORMAL HIGH (ref 134–144)
Total Protein: 6.6 g/dL (ref 6.0–8.5)

## 2019-12-03 LAB — THYROID PANEL WITH TSH
Free Thyroxine Index: 2.5 (ref 1.2–4.9)
T3 Uptake Ratio: 29 % (ref 24–39)
T4, Total: 8.7 ug/dL (ref 4.5–12.0)
TSH: 3.76 u[IU]/mL (ref 0.450–4.500)

## 2019-12-09 ENCOUNTER — Telehealth: Payer: Self-pay | Admitting: Family Medicine

## 2019-12-09 NOTE — Telephone Encounter (Signed)
Aware of results. 

## 2019-12-19 NOTE — Progress Notes (Signed)
Called for appt

## 2019-12-21 DIAGNOSIS — R4182 Altered mental status, unspecified: Secondary | ICD-10-CM | POA: Diagnosis not present

## 2019-12-27 ENCOUNTER — Other Ambulatory Visit: Payer: Self-pay | Admitting: *Deleted

## 2019-12-27 DIAGNOSIS — I10 Essential (primary) hypertension: Secondary | ICD-10-CM

## 2019-12-27 MED ORDER — METOPROLOL SUCCINATE ER 50 MG PO TB24: 50.0000 mg | ORAL_TABLET | Freq: Every day | ORAL | 6 refills | Status: DC

## 2020-01-20 DIAGNOSIS — R4182 Altered mental status, unspecified: Secondary | ICD-10-CM | POA: Diagnosis not present

## 2020-02-03 ENCOUNTER — Other Ambulatory Visit: Payer: Self-pay | Admitting: Family Medicine

## 2020-02-03 ENCOUNTER — Other Ambulatory Visit: Payer: Self-pay

## 2020-02-03 ENCOUNTER — Other Ambulatory Visit: Payer: Medicare HMO

## 2020-02-03 DIAGNOSIS — I10 Essential (primary) hypertension: Secondary | ICD-10-CM | POA: Diagnosis not present

## 2020-02-04 LAB — BASIC METABOLIC PANEL
BUN/Creatinine Ratio: 13 (ref 12–28)
BUN: 14 mg/dL (ref 8–27)
CO2: 26 mmol/L (ref 20–29)
Calcium: 9 mg/dL (ref 8.7–10.3)
Chloride: 107 mmol/L — ABNORMAL HIGH (ref 96–106)
Creatinine, Ser: 1.12 mg/dL — ABNORMAL HIGH (ref 0.57–1.00)
GFR calc Af Amer: 53 mL/min/{1.73_m2} — ABNORMAL LOW (ref >59)
GFR calc non Af Amer: 46 mL/min/{1.73_m2} — ABNORMAL LOW (ref >59)
Glucose: 94 mg/dL (ref 65–99)
Potassium: 3.9 mmol/L (ref 3.5–5.2)
Sodium: 145 mmol/L — ABNORMAL HIGH (ref 134–144)

## 2020-02-10 DIAGNOSIS — I129 Hypertensive chronic kidney disease with stage 1 through stage 4 chronic kidney disease, or unspecified chronic kidney disease: Secondary | ICD-10-CM | POA: Diagnosis not present

## 2020-02-10 DIAGNOSIS — E876 Hypokalemia: Secondary | ICD-10-CM | POA: Diagnosis not present

## 2020-02-10 DIAGNOSIS — N1831 Chronic kidney disease, stage 3a: Secondary | ICD-10-CM | POA: Diagnosis not present

## 2020-02-10 DIAGNOSIS — E870 Hyperosmolality and hypernatremia: Secondary | ICD-10-CM | POA: Diagnosis not present

## 2020-02-20 DIAGNOSIS — R4182 Altered mental status, unspecified: Secondary | ICD-10-CM | POA: Diagnosis not present

## 2020-03-09 ENCOUNTER — Ambulatory Visit (INDEPENDENT_AMBULATORY_CARE_PROVIDER_SITE_OTHER): Payer: Medicare HMO | Admitting: Family Medicine

## 2020-03-09 ENCOUNTER — Encounter: Payer: Self-pay | Admitting: Family Medicine

## 2020-03-09 ENCOUNTER — Other Ambulatory Visit: Payer: Self-pay

## 2020-03-09 VITALS — BP 148/78 | HR 63 | Temp 97.0°F

## 2020-03-09 DIAGNOSIS — I482 Chronic atrial fibrillation, unspecified: Secondary | ICD-10-CM

## 2020-03-09 DIAGNOSIS — J449 Chronic obstructive pulmonary disease, unspecified: Secondary | ICD-10-CM | POA: Diagnosis not present

## 2020-03-09 DIAGNOSIS — N1832 Chronic kidney disease, stage 3b: Secondary | ICD-10-CM | POA: Diagnosis not present

## 2020-03-09 DIAGNOSIS — E782 Mixed hyperlipidemia: Secondary | ICD-10-CM | POA: Diagnosis not present

## 2020-03-09 DIAGNOSIS — F0150 Vascular dementia without behavioral disturbance: Secondary | ICD-10-CM | POA: Diagnosis not present

## 2020-03-09 DIAGNOSIS — I10 Essential (primary) hypertension: Secondary | ICD-10-CM

## 2020-03-09 DIAGNOSIS — I5022 Chronic systolic (congestive) heart failure: Secondary | ICD-10-CM

## 2020-03-09 DIAGNOSIS — R7989 Other specified abnormal findings of blood chemistry: Secondary | ICD-10-CM

## 2020-03-09 NOTE — Progress Notes (Signed)
Assessment & Plan:  1. Essential hypertension - Well controlled on current regimen.  - CMP14+EGFR  2. Mixed hyperlipidemia - Well controlled on current regimen.  - Lipid panel - CMP14+EGFR  3. Chronic systolic congestive heart failure ([place]) - Well controlled on current regimen.  - CMP14+EGFR  4. Chronic atrial fibrillation (HCC) - Well controlled on current regimen.  - CMP14+EGFR  5. Stage 3b chronic kidney disease - Well controlled on current regimen.  - CMP14+EGFR  6. Elevated TSH - Well controlled on current regimen.  - TSH  7. Chronic obstructive pulmonary disease, unspecified COPD type ([place]) - Well controlled on current regimen.   8. Vascular dementia without behavioral disturbance ([place]) - Well controlled on current regimen.  - CMP14+EGFR   Return in about 3 months (around 06/08/2020) for follow-up of chronic medication conditions.  Wanda Limes, MSN, APRN, FNP-C [facility]  Subjective:    Patient ID: Wanda Mckenzie, female    DOB: 1935-07-17, 84 y.o.   MRN: [id number]  Patient Care Team: Loman Brooklyn, FNP as PCP - General (Family Medicine) Satira Sark, MD as PCP - Cardiology (Cardiology)   Chief Complaint:  Chief Complaint  Patient presents with  . Hypertension    3 month follow up of chronic medical conditions  . Hyperlipidemia    HPI: Wanda Mckenzie is a 84 y.o. female presenting on 03/09/2020 for Hypertension (3 month follow up of chronic medical conditions) and Hyperlipidemia  Patient is accompanied by her ex-daughter-in-law who sits with her on Fridays.  She is not fasting today.  CKD managed by Dr. Theador Hawthorne.   New complaints: None  Social history:  Relevant past medical, surgical, family and social history reviewed and updated as indicated. Interim medical history since our last visit reviewed.  Allergies and medications reviewed and updated.  DATA REVIEWED: CHART IN EPIC  ROS: Negative  unless specifically indicated above in HPI.    Current Outpatient Medications:  .  acetaminophen (TYLENOL) 500 MG tablet, Take 1,000 mg by mouth every 6 (six) hours as needed for headache (pain)., Disp: , Rfl:  .  albuterol (VENTOLIN HFA) 108 (90 Base) MCG/ACT inhaler, Inhale 2 puffs into the lungs every 6 (six) hours as needed for wheezing or shortness of breath., Disp: 18 g, Rfl: 2 .  apixaban (ELIQUIS) 5 MG TABS tablet, Take 1 tablet (5 mg total) by mouth 2 (two) times daily., Disp: 180 tablet, Rfl: 1 .  atorvastatin (LIPITOR) 20 MG tablet, Take 1 tablet (20 mg total) by mouth at bedtime., Disp: 90 tablet, Rfl: 1 .  furosemide (LASIX) 40 MG tablet, Take 1 tablet (40 mg total) by mouth daily., Disp: 90 tablet, Rfl: 1 .  hydrALAZINE (APRESOLINE) 100 MG tablet, Take 1 tablet (100 mg total) by mouth every 8 (eight) hours., Disp: 270 tablet, Rfl: 1 .  levothyroxine (SYNTHROID) 25 MCG tablet, TAKE (1/2) TABLET DAILY., Disp: 45 tablet, Rfl: 1 .  losartan (COZAAR) 100 MG tablet, Take 1 tablet (100 mg total) by mouth daily., Disp: 90 tablet, Rfl: 1 .  Memantine HCl-Donepezil HCl (NAMZARIC) 28-10 MG CP24, Take 1 capsule by mouth at bedtime., Disp: 90 capsule, Rfl: 1 .  metoprolol succinate (TOPROL XL) 50 MG 24 hr tablet, Take 1 tablet (50 mg total) by mouth daily., Disp: 30 tablet, Rfl: 6 .  Potassium Chloride ER 20 MEQ TBCR, Take 2 tablets by mouth daily., Disp: 180 tablet, Rfl: 1 .  Spacer/Aero-Holding Chambers (AEROCHAMBER PLUS) inhaler, Use as instructed,  Disp: 1 each, Rfl: 2   Allergies  Allergen Reactions  . Codeine     REACTION: vomiting   Past Medical History:  Diagnosis Date  . Atrial fibrillation ([place])   . COPD (chronic obstructive pulmonary disease) ([place])   . Dyslipidemia   . Essential hypertension   . History of cardiac catheterization 11/2013   Normal coronaries  . History of stroke    Right MCA distribution     Past Surgical History:  Procedure Laterality Date  . ABDOMINAL  HYSTERECTOMY  1975  . FRACTURE SURGERY Left   . LEFT HEART CATHETERIZATION WITH CORONARY ANGIOGRAM N/A 11/23/2013   Procedure: LEFT HEART CATHETERIZATION WITH CORONARY ANGIOGRAM;  Surgeon: Burnell Blanks, MD;  Location: [facility] CATH LAB;  Service: Cardiovascular;  Laterality: N/A;  . LEFT HIP HEMIARTHROPLASTY    . TRIBUTARY VARICOSITIES OF RIGHT LEG  05/23/2002  . VEIN LIGATION AND STRIPPING Right 02/08/2013   Procedure: Segmental excision of painful varicose veins right lower extremity;  Surgeon: Angelia Mould, MD;  Location: [facility] OR;  Service: Vascular;  Laterality: Right;    Social History   Socioeconomic History  . Marital status: Married    Spouse name: Not on file  . Number of children: Not on file  . Years of education: Not on file  . Highest education level: Not on file  Occupational History  . Not on file  Tobacco Use  . Smoking status: Never Smoker  . Smokeless tobacco: Never Used  Vaping Use  . Vaping Use: Never used  Substance and Sexual Activity  . Alcohol use: No    Alcohol/week: 0.0 standard drinks  . Drug use: No  . Sexual activity: Not Currently  Other Topics Concern  . Not on file  Social History Narrative  . Not on file   Social Determinants of Health   Financial Resource Strain:   . Difficulty of Paying Living Expenses: Not on file  Food Insecurity:   . Worried About Charity fundraiser in the Last Year: Not on file  . Ran Out of Food in the Last Year: Not on file  Transportation Needs:   . Lack of Transportation (Medical): Not on file  . Lack of Transportation (Non-Medical): Not on file  Physical Activity:   . Days of Exercise per Week: Not on file  . Minutes of Exercise per Session: Not on file  Stress:   . Feeling of Stress : Not on file  Social Connections:   . Frequency of Communication with Friends and Family: Not on file  . Frequency of Social Gatherings with Friends and Family: Not on file  . Attends Religious Services: Not on file   . Active Member of Clubs or Organizations: Not on file  . Attends Archivist Meetings: Not on file  . Marital Status: Not on file  Intimate Partner Violence:   . Fear of Current or Ex-Partner: Not on file  . Emotionally Abused: Not on file  . Physically Abused: Not on file  . Sexually Abused: Not on file        Objective:    BP (!) 148/78 Comment: manual  Pulse 63   Temp (!) 97 F (36.1 C) (Temporal)   SpO2 94%   Wt Readings from Last 3 Encounters:  06/02/19 179 lb (81.2 kg)  05/25/19 169 lb 5 oz (76.8 kg)  04/29/19 175 lb 3.2 oz (79.5 kg)    Physical Exam Vitals reviewed.  Constitutional:  General: She is not in acute distress.    Appearance: Normal appearance. She is not ill-appearing, toxic-appearing or diaphoretic.  HENT:     Head: Normocephalic and atraumatic.  Eyes:     General: No scleral icterus.       Right eye: No discharge.        Left eye: No discharge.     Conjunctiva/sclera: Conjunctivae normal.  Cardiovascular:     Rate and Rhythm: Normal rate and regular rhythm.     Heart sounds: Normal heart sounds. No murmur heard.  No friction rub. No gallop.   Pulmonary:     Effort: Pulmonary effort is normal. No respiratory distress.     Breath sounds: Normal breath sounds. No stridor. No wheezing, rhonchi or rales.  Musculoskeletal:        General: Normal range of motion.     Cervical back: Normal range of motion.  Skin:    General: Skin is warm and dry.     Capillary Refill: Capillary refill takes less than 2 seconds.  Neurological:     General: No focal deficit present.     Mental Status: She is alert and oriented to person, place, and time. Mental status is at baseline.  Psychiatric:        Mood and Affect: Mood normal.        Behavior: Behavior normal.        Thought Content: Thought content normal.        Judgment: Judgment normal.     Lab Results  Component Value Date   TSH 3.760 12/02/2019   Lab Results  Component Value  Date   WBC 5.9 12/02/2019   HGB 13.1 12/02/2019   HCT 40.1 12/02/2019   MCV 95 12/02/2019   PLT 237 12/02/2019   Lab Results  Component Value Date   NA 145 (H) 02/03/2020   K 3.9 02/03/2020   CO2 26 02/03/2020   GLUCOSE 94 02/03/2020   BUN 14 02/03/2020   CREATININE 1.12 (H) 02/03/2020   BILITOT 0.9 12/02/2019   ALKPHOS 87 12/02/2019   AST 23 12/02/2019   ALT 17 12/02/2019   PROT 6.6 12/02/2019   ALBUMIN 4.1 12/02/2019   CALCIUM 9.0 02/03/2020   ANIONGAP 9 07/22/2019   Lab Results  Component Value Date   CHOL 126 05/15/2019   Lab Results  Component Value Date   HDL 28 (L) 05/15/2019   Lab Results  Component Value Date   LDLCALC 85 05/15/2019   Lab Results  Component Value Date   TRIG 64 05/15/2019   Lab Results  Component Value Date   CHOLHDL 4.5 05/15/2019   Lab Results  Component Value Date   HGBA1C 5.5 07/22/2019

## 2020-03-10 LAB — CMP14+EGFR
ALT: 13 IU/L (ref 0–32)
AST: 18 IU/L (ref 0–40)
Albumin/Globulin Ratio: 1.7 (ref 1.2–2.2)
Albumin: 3.8 g/dL (ref 3.6–4.6)
Alkaline Phosphatase: 79 IU/L (ref 44–121)
BUN/Creatinine Ratio: 16 (ref 12–28)
BUN: 17 mg/dL (ref 8–27)
Bilirubin Total: 0.7 mg/dL (ref 0.0–1.2)
CO2: 28 mmol/L (ref 20–29)
Calcium: 9 mg/dL (ref 8.7–10.3)
Chloride: 106 mmol/L (ref 96–106)
Creatinine, Ser: 1.09 mg/dL — ABNORMAL HIGH (ref 0.57–1.00)
GFR calc Af Amer: 55 mL/min/{1.73_m2} — ABNORMAL LOW (ref >59)
GFR calc non Af Amer: 48 mL/min/{1.73_m2} — ABNORMAL LOW (ref >59)
Globulin, Total: 2.3 g/dL (ref 1.5–4.5)
Glucose: 102 mg/dL — ABNORMAL HIGH (ref 65–99)
Potassium: 3.7 mmol/L (ref 3.5–5.2)
Sodium: 146 mmol/L — ABNORMAL HIGH (ref 134–144)
Total Protein: 6.1 g/dL (ref 6.0–8.5)

## 2020-03-10 LAB — LIPID PANEL
Chol/HDL Ratio: 3.3 ratio (ref 0.0–4.4)
Cholesterol, Total: 123 mg/dL (ref 100–199)
HDL: 37 mg/dL — ABNORMAL LOW (ref >39)
LDL Chol Calc (NIH): 68 mg/dL (ref 0–99)
Triglycerides: 95 mg/dL (ref 0–149)
VLDL Cholesterol Cal: 18 mg/dL (ref 5–40)

## 2020-03-10 LAB — TSH: TSH: 4.17 u[IU]/mL (ref 0.450–4.500)

## 2020-03-11 ENCOUNTER — Encounter: Payer: Self-pay | Admitting: Family Medicine

## 2020-03-22 DIAGNOSIS — R4182 Altered mental status, unspecified: Secondary | ICD-10-CM | POA: Diagnosis not present

## 2020-04-21 DIAGNOSIS — R4182 Altered mental status, unspecified: Secondary | ICD-10-CM | POA: Diagnosis not present

## 2020-05-21 ENCOUNTER — Other Ambulatory Visit: Payer: Self-pay | Admitting: *Deleted

## 2020-05-21 DIAGNOSIS — I509 Heart failure, unspecified: Secondary | ICD-10-CM

## 2020-05-21 DIAGNOSIS — I10 Essential (primary) hypertension: Secondary | ICD-10-CM

## 2020-05-21 MED ORDER — LOSARTAN POTASSIUM 100 MG PO TABS: 100.0000 mg | ORAL_TABLET | Freq: Every day | ORAL | 0 refills | Status: DC

## 2020-05-22 DIAGNOSIS — R4182 Altered mental status, unspecified: Secondary | ICD-10-CM | POA: Diagnosis not present

## 2020-05-29 ENCOUNTER — Other Ambulatory Visit: Payer: Self-pay | Admitting: Family Medicine

## 2020-05-29 DIAGNOSIS — Z8673 Personal history of transient ischemic attack (TIA), and cerebral infarction without residual deficits: Secondary | ICD-10-CM

## 2020-05-29 DIAGNOSIS — I82402 Acute embolism and thrombosis of unspecified deep veins of left lower extremity: Secondary | ICD-10-CM

## 2020-06-05 ENCOUNTER — Other Ambulatory Visit: Payer: Self-pay

## 2020-06-05 ENCOUNTER — Ambulatory Visit (INDEPENDENT_AMBULATORY_CARE_PROVIDER_SITE_OTHER): Payer: Medicare HMO | Admitting: Nurse Practitioner

## 2020-06-05 ENCOUNTER — Encounter: Payer: Self-pay | Admitting: Nurse Practitioner

## 2020-06-05 VITALS — BP 139/86 | HR 59 | Temp 96.4°F | Resp 20 | Ht 72.0 in | Wt 179.0 lb

## 2020-06-05 DIAGNOSIS — M5440 Lumbago with sciatica, unspecified side: Secondary | ICD-10-CM | POA: Insufficient documentation

## 2020-06-05 DIAGNOSIS — E785 Hyperlipidemia, unspecified: Secondary | ICD-10-CM

## 2020-06-05 DIAGNOSIS — I10 Essential (primary) hypertension: Secondary | ICD-10-CM | POA: Diagnosis not present

## 2020-06-05 DIAGNOSIS — R7989 Other specified abnormal findings of blood chemistry: Secondary | ICD-10-CM

## 2020-06-05 MED ORDER — DICLOFENAC SODIUM 1 % EX GEL: 2.0000 g | Freq: Four times a day (QID) | CUTANEOUS | 2 refills | Status: AC

## 2020-06-05 NOTE — Assessment & Plan Note (Signed)
TSH completed results pending.  Current medication levothyroxine 25 mg tablets daily.  Education provided with printed handouts given. Follow-up in 3 months

## 2020-06-05 NOTE — Assessment & Plan Note (Signed)
Acute bilateral lower back pain with sciatica not well controlled reported by chaperone.  Advised patient and chaperone to apply Voltaren gel, warm compress, Tylenol as tolerated.  Follow-up with worsening unresolved symptoms.  Rx sent to pharmacy.

## 2020-06-05 NOTE — Progress Notes (Signed)
Established Patient Office Visit  Subjective:  Patient ID: Wanda Mckenzie, female    DOB: 1935/07/15  Age: 84 y.o. MRN: [id number]  CC:  Chief Complaint  Patient presents with   Medical Management of Chronic Issues    HPI Wanda Mckenzie presents for Pt presents for follow up of hypertension. Patient was diagnosed in 12/07/2014. The patient is tolerating the medication well without side effects. Compliance with treatment has been good; including taking medication as directed , maintains a healthy diet, and following up as directed.  Current medication Metroprolol 50 mg tablet once daily, losartan 100 mg tablet by mouth daily  Mixed hyperlipidemia  Pt presents with hyperlipidemia. Patient was diagnosed in 06/19/2016.Marland Kitchen Compliance with treatment has been good; The patient is compliant with medications, maintains a low cholesterol diet , follows up as directed , and maintains an exercise regimen . The patient denies experiencing any hypercholesterolemia related symptoms.  Current medication atorvastatin 20 mg tablet by mouth daily at bedtime.   Thyroid: Patient presents for evaluation of hypothyroidism. Current symptoms include fatigue. Patient denies anxiousness, feeling excessive energy, palpitations.  Current medication levothyroxine 25 mg tablet daily    Past Medical History:  Diagnosis Date   Atrial fibrillation (HCC)    COPD (chronic obstructive pulmonary disease) (HCC)    Dyslipidemia    Essential hypertension    History of cardiac catheterization 11/2013   Normal coronaries   History of stroke    Right MCA distribution     Past Surgical History:  Procedure Laterality Date   ABDOMINAL HYSTERECTOMY  1975   FRACTURE SURGERY Left    LEFT HEART CATHETERIZATION WITH CORONARY ANGIOGRAM N/A 11/23/2013   Procedure: LEFT HEART CATHETERIZATION WITH CORONARY ANGIOGRAM;  Surgeon: Kathleene Hazel, MD;  Location: [facility] CATH LAB;  Service: Cardiovascular;  Laterality: N/A;    LEFT HIP HEMIARTHROPLASTY     TRIBUTARY VARICOSITIES OF RIGHT LEG  05/23/2002   VEIN LIGATION AND STRIPPING Right 02/08/2013   Procedure: Segmental excision of painful varicose veins right lower extremity;  Surgeon: Chuck Hint, MD;  Location: [facility] OR;  Service: Vascular;  Laterality: Right;    Family History  Problem Relation Age of Onset   Stroke Father    Cancer Mother    Hypertension Mother    Cancer Sister    Cancer Sister    Cancer Sister    Hypertension Son     Social History   Socioeconomic History   Marital status: Married    Spouse name: Not on file   Number of children: Not on file   Years of education: Not on file   Highest education level: Not on file  Occupational History   Not on file  Tobacco Use   Smoking status: Never Smoker   Smokeless tobacco: Never Used  Vaping Use   Vaping Use: Never used  Substance and Sexual Activity   Alcohol use: No    Alcohol/week: 0.0 standard drinks   Drug use: No   Sexual activity: Not Currently  Other Topics Concern   Not on file  Social History Narrative   Not on file   Social Determinants of Health   Financial Resource Strain: Not on file  Food Insecurity: Not on file  Transportation Needs: Not on file  Physical Activity: Not on file  Stress: Not on file  Social Connections: Not on file  Intimate Partner Violence: Not on file    Outpatient Medications Prior to Visit  Medication Sig Dispense Refill  acetaminophen (TYLENOL) 500 MG tablet Take 1,000 mg by mouth every 6 (six) hours as needed for headache (pain).     albuterol (VENTOLIN HFA) 108 (90 Base) MCG/ACT inhaler Inhale 2 puffs into the lungs every 6 (six) hours as needed for wheezing or shortness of breath. 18 g 2   atorvastatin (LIPITOR) 20 MG tablet Take 1 tablet (20 mg total) by mouth at bedtime. 90 tablet 1   ELIQUIS 5 MG TABS tablet TAKE ONE TABLET TWICE A DAY. 60 tablet 0   furosemide (LASIX) 40 MG tablet Take  1 tablet (40 mg total) by mouth daily. 90 tablet 1   hydrALAZINE (APRESOLINE) 100 MG tablet Take 1 tablet (100 mg total) by mouth every 8 (eight) hours. 270 tablet 1   levothyroxine (SYNTHROID) 25 MCG tablet TAKE (1/2) TABLET DAILY. 45 tablet 1   losartan (COZAAR) 100 MG tablet Take 1 tablet (100 mg total) by mouth daily. 90 tablet 0   Memantine HCl-Donepezil HCl (NAMZARIC) 28-10 MG CP24 Take 1 capsule by mouth at bedtime. 90 capsule 1   metoprolol succinate (TOPROL XL) 50 MG 24 hr tablet Take 1 tablet (50 mg total) by mouth daily. 30 tablet 6   Potassium Chloride ER 20 MEQ TBCR Take 2 tablets by mouth daily. 180 tablet 1   Spacer/Aero-Holding Chambers (AEROCHAMBER PLUS) inhaler Use as instructed 1 each 2   No facility-administered medications prior to visit.    Allergies  Allergen Reactions   Codeine     REACTION: vomiting    ROS Review of Systems  Constitutional: Negative.   HENT: Negative.   Respiratory: Negative.   Gastrointestinal: Negative.   Genitourinary: Negative.   Musculoskeletal: Positive for arthralgias, back pain and myalgias.  Skin: Negative.   All other systems reviewed and are negative.     Objective:    Physical Exam Vitals reviewed.  Constitutional:      General: She is sleeping.     Appearance: Normal appearance. She is well-developed.     Interventions: Face mask in place.  HENT:     Head: Normocephalic.     Nose: Nose normal.  Eyes:     Conjunctiva/sclera: Conjunctivae normal.  Cardiovascular:     Rate and Rhythm: Normal rate.     Pulses: Normal pulses.     Heart sounds: Normal heart sounds.  Pulmonary:     Effort: Pulmonary effort is normal.     Breath sounds: Normal breath sounds.  Abdominal:     General: Bowel sounds are normal.  Musculoskeletal:        General: Normal range of motion.  Skin:    General: Skin is warm.  Neurological:     Mental Status: She is oriented to person, place, and time. She is lethargic.  Psychiatric:         Mood and Affect: Mood normal.        Behavior: Behavior normal. Behavior is cooperative.     BP 139/86    Pulse (!) 59    Temp (!) 96.4 F (35.8 C) (Temporal)    Resp 20    Ht 6' (1.829 m)    Wt 179 lb (81.2 kg)    SpO2 97%    BMI 24.28 kg/m  Wt Readings from Last 3 Encounters:  06/05/20 179 lb (81.2 kg)  06/02/19 179 lb (81.2 kg)  05/25/19 169 lb 5 oz (76.8 kg)     Health Maintenance Due  Topic Date Due   DEXA SCAN  Never done  There are no preventive care reminders to display for this patient.  Lab Results  Component Value Date   TSH 4.170 03/09/2020   Lab Results  Component Value Date   WBC 5.9 12/02/2019   HGB 13.1 12/02/2019   HCT 40.1 12/02/2019   MCV 95 12/02/2019   PLT 237 12/02/2019   Lab Results  Component Value Date   NA 146 (H) 03/09/2020   K 3.7 03/09/2020   CO2 28 03/09/2020   GLUCOSE 102 (H) 03/09/2020   BUN 17 03/09/2020   CREATININE 1.09 (H) 03/09/2020   BILITOT 0.7 03/09/2020   ALKPHOS 79 03/09/2020   AST 18 03/09/2020   ALT 13 03/09/2020   PROT 6.1 03/09/2020   ALBUMIN 3.8 03/09/2020   CALCIUM 9.0 03/09/2020   ANIONGAP 9 07/22/2019   Lab Results  Component Value Date   CHOL 123 03/09/2020   Lab Results  Component Value Date   HDL 37 (L) 03/09/2020   Lab Results  Component Value Date   LDLCALC 68 03/09/2020   Lab Results  Component Value Date   TRIG 95 03/09/2020   Lab Results  Component Value Date   CHOLHDL 3.3 03/09/2020   Lab Results  Component Value Date   HGBA1C 5.5 07/22/2019      Assessment & Plan:   Problem List Items Addressed This Visit      Cardiovascular and Mediastinum   Essential hypertension - Primary (Chronic)    Well-controlled on current medication.  Continue on low-sodium diet.  Provided education with printed handouts given.  Follow-up in 3 months      Relevant Orders   CBC with Differential   Comprehensive metabolic panel     Nervous and Auditory   Acute bilateral low back  pain with sciatica    Acute bilateral lower back pain with sciatica not well controlled reported by chaperone.  Advised patient and chaperone to apply Voltaren gel, warm compress, Tylenol as tolerated.  Follow-up with worsening unresolved symptoms.  Rx sent to pharmacy.      Relevant Medications   diclofenac Sodium (VOLTAREN) 1 % GEL     Other   Hyperlipidemia    Hyperlipidemia well-controlled on current medication atorvastatin 20 mg tablet by mouth daily at bedtime.  Completed lipid panel results pending.  Continue low-cholesterol diet.  Education provided with printed handouts given.  Follow-up in 3 months.        Relevant Orders   Lipid Panel   Elevated TSH    TSH completed results pending.  Current medication levothyroxine 25 mg tablets daily.  Education provided with printed handouts given. Follow-up in 3 months      Relevant Orders   TSH      Meds ordered this encounter  Medications   diclofenac Sodium (VOLTAREN) 1 % GEL    Sig: Apply 2 g topically 4 (four) times daily.    Dispense:  50 g    Refill:  2    Order Specific Question:   Supervising Provider    Answer:   Arville Care A [id number]    Follow-up: Return in about 3 months (around 09/03/2020).    Daryll Drown, NP

## 2020-06-05 NOTE — Assessment & Plan Note (Signed)
Well-controlled on current medication.  Continue on low-sodium diet.  Provided education with printed handouts given.  Follow-up in 3 months

## 2020-06-05 NOTE — Assessment & Plan Note (Signed)
Hyperlipidemia well-controlled on current medication atorvastatin 20 mg tablet by mouth daily at bedtime.  Completed lipid panel results pending.  Continue low-cholesterol diet.  Education provided with printed handouts given.  Follow-up in 3 months.

## 2020-06-05 NOTE — Patient Instructions (Addendum)
Follow up in 3 months   High Cholesterol  High cholesterol is a condition in which the blood has high levels of a white, waxy, fat-like substance (cholesterol). The human body needs small amounts of cholesterol. The liver makes all the cholesterol that the body needs. Extra (excess) cholesterol comes from the food that we eat. Cholesterol is carried from the liver by the blood through the blood vessels. If you have high cholesterol, deposits (plaques) may build up on the walls of your blood vessels (arteries). Plaques make the arteries narrower and stiffer. Cholesterol plaques increase your risk for heart attack and stroke. Work with your health care provider to keep your cholesterol levels in a healthy range. What increases the risk? This condition is more likely to develop in people who:  Eat foods that are high in animal fat (saturated fat) or cholesterol.  Are overweight.  Are not getting enough exercise.  Have a family history of high cholesterol. What are the signs or symptoms? There are no symptoms of this condition. How is this diagnosed? This condition may be diagnosed from the results of a blood test.  If you are older than age 20, your health care provider may check your cholesterol every 4-6 years.  You may be checked more often if you already have high cholesterol or other risk factors for heart disease. The blood test for cholesterol measures:  "Bad" cholesterol (LDL cholesterol). This is the main type of cholesterol that causes heart disease. The desired level for LDL is less than 100.  "Good" cholesterol (HDL cholesterol). This type helps to protect against heart disease by cleaning the arteries and carrying the LDL away. The desired level for HDL is 60 or higher.  Triglycerides. These are fats that the body can store or burn for energy. The desired number for triglycerides is lower than 150.  Total cholesterol. This is a measure of the total amount of cholesterol  in your blood, including LDL cholesterol, HDL cholesterol, and triglycerides. A healthy number is less than 200. How is this treated? This condition is treated with diet changes, lifestyle changes, and medicines. Diet changes  This may include eating more whole grains, fruits, vegetables, nuts, and fish.  This may also include cutting back on red meat and foods that have a lot of added sugar. Lifestyle changes  Changes may include getting at least 40 minutes of aerobic exercise 3 times a week. Aerobic exercises include walking, biking, and swimming. Aerobic exercise along with a healthy diet can help you maintain a healthy weight.  Changes may also include quitting smoking. Medicines  Medicines are usually given if diet and lifestyle changes have failed to reduce your cholesterol to healthy levels.  Your health care provider may prescribe a statin medicine. Statin medicines have been shown to reduce cholesterol, which can reduce the risk of heart disease. Follow these instructions at home: Eating and drinking If told by your health care provider:  Eat chicken (without skin), fish, veal, shellfish, ground [place] breast, and round or loin cuts of red meat.  Do not eat fried foods or fatty meats, such as hot dogs and salami.  Eat plenty of fruits, such as apples.  Eat plenty of vegetables, such as broccoli, potatoes, and carrots.  Eat beans, peas, and lentils.  Eat grains such as barley, rice, couscous, and bulgur wheat.  Eat pasta without cream sauces.  Use skim or nonfat milk, and eat low-fat or nonfat yogurt and cheeses.  Do not eat or drink  whole milk, cream, ice cream, egg yolks, or hard cheeses.  Do not eat stick margarine or tub margarines that contain trans fats (also called partially hydrogenated oils).  Do not eat saturated tropical oils, such as coconut oil and palm oil.  Do not eat cakes, cookies, crackers, or other baked goods that contain trans fats.  General  instructions  Exercise as directed by your health care provider. Increase your activity level with activities such as gardening, walking, and taking the stairs.  Take over-the-counter and prescription medicines only as told by your health care provider.  Do not use any products that contain nicotine or tobacco, such as cigarettes and e-cigarettes. If you need help quitting, ask your health care provider.  Keep all follow-up visits as told by your health care provider. This is important. Contact a health care provider if:  You are struggling to maintain a healthy diet or weight.  You need help to start on an exercise program.  You need help to stop smoking. Get help right away if:  You have chest pain.  You have trouble breathing. This information is not intended to replace advice given to you by your health care provider. Make sure you discuss any questions you have with your health care provider. Document Revised: 06/12/2017 Document Reviewed: 12/08/2015 Elsevier Patient Education  2020 [employer]. Hypertension, Adult Hypertension is another name for high blood pressure. High blood pressure forces your heart to work harder to pump blood. This can cause problems over time. There are two numbers in a blood pressure reading. There is a top number (systolic) over a bottom number (diastolic). It is best to have a blood pressure that is below 120/80. Healthy choices can help lower your blood pressure, or you may need medicine to help lower it. What are the causes? The cause of this condition is not known. Some conditions may be related to high blood pressure. What increases the risk?  Smoking.  Having type 2 diabetes mellitus, high cholesterol, or both.  Not getting enough exercise or physical activity.  Being overweight.  Having too much fat, sugar, calories, or salt (sodium) in your diet.  Drinking too much alcohol.  Having long-term (chronic) kidney disease.  Having a  family history of high blood pressure.  Age. Risk increases with age.  Race. You may be at higher risk if you are African American.  Gender. Men are at higher risk than women before age 51. After age 42, women are at higher risk than men.  Having obstructive sleep apnea.  Stress. What are the signs or symptoms?  High blood pressure may not cause symptoms. Very high blood pressure (hypertensive crisis) may cause: ? Headache. ? Feelings of worry or nervousness (anxiety). ? Shortness of breath. ? Nosebleed. ? A feeling of being sick to your stomach (nausea). ? Throwing up (vomiting). ? Changes in how you see. ? Very bad chest pain. ? Seizures. How is this treated?  This condition is treated by making healthy lifestyle changes, such as: ? Eating healthy foods. ? Exercising more. ? Drinking less alcohol.  Your health care provider may prescribe medicine if lifestyle changes are not enough to get your blood pressure under control, and if: ? Your top number is above 130. ? Your bottom number is above 80.  Your personal target blood pressure may vary. Follow these instructions at home: Eating and drinking   If told, follow the DASH eating plan. To follow this plan: ? Fill one half of your  plate at each meal with fruits and vegetables. ? Fill one fourth of your plate at each meal with whole grains. Whole grains include whole-wheat pasta, brown rice, and whole-grain bread. ? Eat or drink low-fat dairy products, such as skim milk or low-fat yogurt. ? Fill one fourth of your plate at each meal with low-fat (lean) proteins. Low-fat proteins include fish, chicken without skin, eggs, beans, and tofu. ? Avoid fatty meat, cured and processed meat, or chicken with skin. ? Avoid pre-made or processed food.  Eat less than 1,500 mg of salt each day.  Do not drink alcohol if: ? Your doctor tells you not to drink. ? You are pregnant, may be pregnant, or are planning to become  pregnant.  If you drink alcohol: ? Limit how much you use to:  0-1 drink a day for women.  0-2 drinks a day for men. ? Be aware of how much alcohol is in your drink. In the [place] one drink equals one 12 oz bottle of beer (355 mL), one 5 oz glass of wine (148 mL), or one 1 oz glass of hard liquor (44 mL). Lifestyle   Work with your doctor to stay at a healthy weight or to lose weight. Ask your doctor what the best weight is for you.  Get at least 30 minutes of exercise most days of the week. This may include walking, swimming, or biking.  Get at least 30 minutes of exercise that strengthens your muscles (resistance exercise) at least 3 days a week. This may include lifting weights or doing Pilates.  Do not use any products that contain nicotine or tobacco, such as cigarettes, e-cigarettes, and chewing tobacco. If you need help quitting, ask your doctor.  Check your blood pressure at home as told by your doctor.  Keep all follow-up visits as told by your doctor. This is important. Medicines  Take over-the-counter and prescription medicines only as told by your doctor. Follow directions carefully.  Do not skip doses of blood pressure medicine. The medicine does not work as well if you skip doses. Skipping doses also puts you at risk for problems.  Ask your doctor about side effects or reactions to medicines that you should watch for. Contact a doctor if you:  Think you are having a reaction to the medicine you are taking.  Have headaches that keep coming back (recurring).  Feel dizzy.  Have swelling in your ankles.  Have trouble with your vision. Get help right away if you:  Get a very bad headache.  Start to feel mixed up (confused).  Feel weak or numb.  Feel faint.  Have very bad pain in your: ? Chest. ? Belly (abdomen).  Throw up more than once.  Have trouble breathing. Summary  Hypertension is another name for high blood pressure.  High blood pressure  forces your heart to work harder to pump blood.  For most people, a normal blood pressure is less than 120/80.  Making healthy choices can help lower blood pressure. If your blood pressure does not get lower with healthy choices, you may need to take medicine. This information is not intended to replace advice given to you by your health care provider. Make sure you discuss any questions you have with your health care provider. Document Revised: 02/17/2018 Document Reviewed: 02/17/2018 Elsevier Patient Education  2020 [employer].

## 2020-06-06 LAB — CBC WITH DIFFERENTIAL/PLATELET
Basophils Absolute: 0.1 10*3/uL (ref 0.0–0.2)
Basos: 1 %
EOS (ABSOLUTE): 0.2 10*3/uL (ref 0.0–0.4)
Eos: 3 %
Hematocrit: 42.1 % (ref 34.0–46.6)
Hemoglobin: 14.1 g/dL (ref 11.1–15.9)
Immature Grans (Abs): 0 10*3/uL (ref 0.0–0.1)
Immature Granulocytes: 0 %
Lymphocytes Absolute: 1.8 10*3/uL (ref 0.7–3.1)
Lymphs: 26 %
MCH: 31.8 pg (ref 26.6–33.0)
MCHC: 33.5 g/dL (ref 31.5–35.7)
MCV: 95 fL (ref 79–97)
Monocytes Absolute: 0.4 10*3/uL (ref 0.1–0.9)
Monocytes: 7 %
Neutrophils Absolute: 4.3 10*3/uL (ref 1.4–7.0)
Neutrophils: 63 %
Platelets: 261 10*3/uL (ref 150–450)
RBC: 4.44 x10E6/uL (ref 3.77–5.28)
RDW: 12.3 % (ref 11.7–15.4)
WBC: 6.7 10*3/uL (ref 3.4–10.8)

## 2020-06-06 LAB — LIPID PANEL
Chol/HDL Ratio: 3.3 ratio (ref 0.0–4.4)
Cholesterol, Total: 117 mg/dL (ref 100–199)
HDL: 36 mg/dL — ABNORMAL LOW (ref >39)
LDL Chol Calc (NIH): 57 mg/dL (ref 0–99)
Triglycerides: 137 mg/dL (ref 0–149)
VLDL Cholesterol Cal: 24 mg/dL (ref 5–40)

## 2020-06-06 LAB — COMPREHENSIVE METABOLIC PANEL
ALT: 22 IU/L (ref 0–32)
AST: 30 IU/L (ref 0–40)
Albumin/Globulin Ratio: 1.6 (ref 1.2–2.2)
Albumin: 4 g/dL (ref 3.6–4.6)
Alkaline Phosphatase: 87 IU/L (ref 44–121)
BUN/Creatinine Ratio: 13 (ref 12–28)
BUN: 15 mg/dL (ref 8–27)
Bilirubin Total: 0.6 mg/dL (ref 0.0–1.2)
CO2: 24 mmol/L (ref 20–29)
Calcium: 9.6 mg/dL (ref 8.7–10.3)
Chloride: 105 mmol/L (ref 96–106)
Creatinine, Ser: 1.17 mg/dL — ABNORMAL HIGH (ref 0.57–1.00)
GFR calc Af Amer: 50 mL/min/{1.73_m2} — ABNORMAL LOW (ref >59)
GFR calc non Af Amer: 43 mL/min/{1.73_m2} — ABNORMAL LOW (ref >59)
Globulin, Total: 2.5 g/dL (ref 1.5–4.5)
Glucose: 119 mg/dL — ABNORMAL HIGH (ref 65–99)
Potassium: 3.9 mmol/L (ref 3.5–5.2)
Sodium: 144 mmol/L (ref 134–144)
Total Protein: 6.5 g/dL (ref 6.0–8.5)

## 2020-06-06 LAB — TSH: TSH: 5.31 u[IU]/mL — ABNORMAL HIGH (ref 0.450–4.500)

## 2020-06-08 DIAGNOSIS — N1832 Chronic kidney disease, stage 3b: Secondary | ICD-10-CM | POA: Diagnosis not present

## 2020-06-08 DIAGNOSIS — I5032 Chronic diastolic (congestive) heart failure: Secondary | ICD-10-CM | POA: Diagnosis not present

## 2020-06-08 DIAGNOSIS — E870 Hyperosmolality and hypernatremia: Secondary | ICD-10-CM | POA: Diagnosis not present

## 2020-06-08 DIAGNOSIS — I129 Hypertensive chronic kidney disease with stage 1 through stage 4 chronic kidney disease, or unspecified chronic kidney disease: Secondary | ICD-10-CM | POA: Diagnosis not present

## 2020-06-15 ENCOUNTER — Other Ambulatory Visit: Payer: Self-pay | Admitting: Family Medicine

## 2020-06-15 DIAGNOSIS — E782 Mixed hyperlipidemia: Secondary | ICD-10-CM

## 2020-06-15 DIAGNOSIS — Z8673 Personal history of transient ischemic attack (TIA), and cerebral infarction without residual deficits: Secondary | ICD-10-CM

## 2020-06-19 ENCOUNTER — Other Ambulatory Visit: Payer: Self-pay | Admitting: Family Medicine

## 2020-06-19 DIAGNOSIS — I509 Heart failure, unspecified: Secondary | ICD-10-CM

## 2020-06-19 DIAGNOSIS — I10 Essential (primary) hypertension: Secondary | ICD-10-CM

## 2020-06-26 ENCOUNTER — Other Ambulatory Visit: Payer: Self-pay | Admitting: Family Medicine

## 2020-06-26 DIAGNOSIS — F0150 Vascular dementia without behavioral disturbance: Secondary | ICD-10-CM

## 2020-07-10 ENCOUNTER — Encounter (HOSPITAL_COMMUNITY): Payer: Self-pay | Admitting: Emergency Medicine

## 2020-07-10 ENCOUNTER — Emergency Department (HOSPITAL_COMMUNITY): Payer: Medicare Other

## 2020-07-10 ENCOUNTER — Other Ambulatory Visit: Payer: Self-pay

## 2020-07-10 ENCOUNTER — Emergency Department (HOSPITAL_COMMUNITY)
Admission: EM | Admit: 2020-07-10 | Discharge: 2020-07-10 | Disposition: A | Discharge status: Home / Self Care | Payer: Medicare Other | Source: Home / Self Care | Attending: Emergency Medicine | Admitting: Emergency Medicine

## 2020-07-10 DIAGNOSIS — I951 Orthostatic hypotension: Secondary | ICD-10-CM | POA: Diagnosis not present

## 2020-07-10 DIAGNOSIS — Z96642 Presence of left artificial hip joint: Secondary | ICD-10-CM | POA: Insufficient documentation

## 2020-07-10 DIAGNOSIS — J449 Chronic obstructive pulmonary disease, unspecified: Secondary | ICD-10-CM | POA: Insufficient documentation

## 2020-07-10 DIAGNOSIS — R55 Syncope and collapse: Secondary | ICD-10-CM | POA: Insufficient documentation

## 2020-07-10 DIAGNOSIS — F0390 Unspecified dementia without behavioral disturbance: Secondary | ICD-10-CM

## 2020-07-10 DIAGNOSIS — N1832 Chronic kidney disease, stage 3b: Secondary | ICD-10-CM | POA: Insufficient documentation

## 2020-07-10 DIAGNOSIS — I5022 Chronic systolic (congestive) heart failure: Secondary | ICD-10-CM | POA: Insufficient documentation

## 2020-07-10 DIAGNOSIS — Z79899 Other long term (current) drug therapy: Secondary | ICD-10-CM | POA: Insufficient documentation

## 2020-07-10 DIAGNOSIS — I130 Hypertensive heart and chronic kidney disease with heart failure and stage 1 through stage 4 chronic kidney disease, or unspecified chronic kidney disease: Secondary | ICD-10-CM | POA: Insufficient documentation

## 2020-07-10 DIAGNOSIS — F0150 Vascular dementia without behavioral disturbance: Secondary | ICD-10-CM | POA: Insufficient documentation

## 2020-07-10 DIAGNOSIS — Z7901 Long term (current) use of anticoagulants: Secondary | ICD-10-CM | POA: Insufficient documentation

## 2020-07-10 LAB — BASIC METABOLIC PANEL
Anion gap: 11 (ref 5–15)
BUN: 11 mg/dL (ref 8–23)
CO2: 26 mmol/L (ref 22–32)
Calcium: 9.1 mg/dL (ref 8.9–10.3)
Chloride: 105 mmol/L (ref 98–111)
Creatinine, Ser: 1.26 mg/dL — ABNORMAL HIGH (ref 0.44–1.00)
GFR, Estimated: 43 mL/min — ABNORMAL LOW (ref >60)
Glucose, Bld: 105 mg/dL — ABNORMAL HIGH (ref 70–99)
Potassium: 3.7 mmol/L (ref 3.5–5.1)
Sodium: 142 mmol/L (ref 135–145)

## 2020-07-10 LAB — URINALYSIS, ROUTINE W REFLEX MICROSCOPIC
Bilirubin Urine: NEGATIVE
Glucose, UA: NEGATIVE mg/dL
Hgb urine dipstick: NEGATIVE
Ketones, ur: NEGATIVE mg/dL
Leukocytes,Ua: NEGATIVE
Nitrite: NEGATIVE
Protein, ur: 30 mg/dL — AB
Specific Gravity, Urine: 1.021 (ref 1.005–1.030)
pH: 5 (ref 5.0–8.0)

## 2020-07-10 LAB — CBC
HCT: 41.5 % (ref 36.0–46.0)
Hemoglobin: 14 g/dL (ref 12.0–15.0)
MCH: 32.3 pg (ref 26.0–34.0)
MCHC: 33.7 g/dL (ref 30.0–36.0)
MCV: 95.8 fL (ref 80.0–100.0)
Platelets: 234 10*3/uL (ref 150–400)
RBC: 4.33 MIL/uL (ref 3.87–5.11)
RDW: 13.4 % (ref 11.5–15.5)
WBC: 4.9 10*3/uL (ref 4.0–10.5)
nRBC: 0 % (ref 0.0–0.2)

## 2020-07-10 MED ORDER — HYDRALAZINE HCL 50 MG PO TABS
100.0000 mg | ORAL_TABLET | Freq: Once | ORAL | Status: DC
  Filled 2020-07-10: qty 2

## 2020-07-10 MED ORDER — METOPROLOL TARTRATE 5 MG/5ML IV SOLN
5.0000 mg | Freq: Once | INTRAVENOUS | Status: AC
  Administered 2020-07-10: 5 mg via INTRAVENOUS
  Filled 2020-07-10: qty 5

## 2020-07-10 NOTE — ED Provider Notes (Signed)
[facility] EMERGENCY DEPARTMENT Provider Note   CSN: [id number] Arrival date & time: 07/10/20  1109     History Chief Complaint  Patient presents with  . Loss of Consciousness    Wanda Mckenzie is a 85 y.o. female.  HPI   85 year old female past medical history of atrial fibrillation anticoagulant Eliquis, HTN, HLD presents the emergency department for reported syncope.  Patient arrived by EMS, report is that while the patient was getting ready this morning she passed out.  Patient has baseline dementia, she is a very poor historian.  Fingerstick with EMS was appropriate.  Past Medical History:  Diagnosis Date  . Atrial fibrillation (HCC)   . COPD (chronic obstructive pulmonary disease) (HCC)   . Dyslipidemia   . Essential hypertension   . History of cardiac catheterization 11/2013   Normal coronaries  . History of stroke    Right MCA distribution     Patient Active Problem List   Diagnosis Date Noted  . Acute bilateral low back pain with sciatica 06/05/2020  . Nonischemic cardiomyopathy (HCC) 06/02/2019  . Stage 3b chronic kidney disease (HCC) 05/14/2019  . Elevated TSH 04/13/2019  . Chronic congestive heart failure (HCC) 01/10/2019  . Late effects of CVA (cerebrovascular accident) 04/06/2018  . Hyperlipidemia 06/19/2016  . Vascular dementia without behavioral disturbance (HCC) 06/08/2014  . Varicose veins of lower extremities with other complications 02/02/2013  . Essential hypertension   . History of stroke   . Aortic insufficiency   . COPD (chronic obstructive pulmonary disease) (HCC)   . Chronic atrial fibrillation (HCC)   . Carotid bruit     Past Surgical History:  Procedure Laterality Date  . ABDOMINAL HYSTERECTOMY  1975  . FRACTURE SURGERY Left   . LEFT HEART CATHETERIZATION WITH CORONARY ANGIOGRAM N/A 11/23/2013   Procedure: LEFT HEART CATHETERIZATION WITH CORONARY ANGIOGRAM;  Surgeon: Kathleene Hazel, MD;  Location: [facility] CATH  LAB;  Service: Cardiovascular;  Laterality: N/A;  . LEFT HIP HEMIARTHROPLASTY    . TRIBUTARY VARICOSITIES OF RIGHT LEG  05/23/2002  . VEIN LIGATION AND STRIPPING Right 02/08/2013   Procedure: Segmental excision of painful varicose veins right lower extremity;  Surgeon: Chuck Hint, MD;  Location: [facility] OR;  Service: Vascular;  Laterality: Right;     OB History   No obstetric history on file.     Family History  Problem Relation Age of Onset  . Stroke Father   . Cancer Mother   . Hypertension Mother   . Cancer Sister   . Cancer Sister   . Cancer Sister   . Hypertension Son     Social History   Tobacco Use  . Smoking status: Never Smoker  . Smokeless tobacco: Never Used  Vaping Use  . Vaping Use: Never used  Substance Use Topics  . Alcohol use: No    Alcohol/week: 0.0 standard drinks  . Drug use: No    Home Medications Prior to Admission medications   Medication Sig Start Date End Date Taking? Authorizing Provider  acetaminophen (TYLENOL) 500 MG tablet Take 1,000 mg by mouth every 6 (six) hours as needed for headache (pain).    [provider]  albuterol (VENTOLIN HFA) 108 (90 Base) MCG/ACT inhaler Inhale 2 puffs into the lungs every 6 (six) hours as needed for wheezing or shortness of breath. 01/10/19   [name], FNP  atorvastatin (LIPITOR) 20 MG tablet TAKE (1) TABLET DAILY AT BEDTIME. 06/18/20   [name], FNP  diclofenac Sodium (VOLTAREN) 1 % GEL Apply 2 g topically 4 (four) times daily. 06/05/20   Daryll Drown, NP  ELIQUIS 5 MG TABS tablet TAKE ONE TABLET TWICE A DAY. 05/29/20   Daphine Deutscher, Mary-Margaret, FNP  furosemide (LASIX) 40 MG tablet Take 1 tablet (40 mg total) by mouth daily. 12/02/19   [name], FNP  hydrALAZINE (APRESOLINE) 100 MG tablet Take 1 tablet (100 mg total) by mouth every 8 (eight) hours. 12/02/19   [name], FNP  levothyroxine (SYNTHROID) 25 MCG tablet TAKE (1/2) TABLET DAILY. 12/02/19   [name],  FNP  losartan (COZAAR) 100 MG tablet Take 1 tablet (100 mg total) by mouth daily. 05/21/20   Jannifer Rodney A, FNP  metoprolol succinate (TOPROL XL) 50 MG 24 hr tablet Take 1 tablet (50 mg total) by mouth daily. 12/27/19   Jonelle Sidle, MD  [facility] 28-10 MG CP24 TAKE (1) CAPSULE DAILY AT BEDTIME. 06/26/20   Delynn Flavin M, DO  potassium chloride SA (KLOR-CON) 20 MEQ tablet TAKE 2 TABLETS ONCE DAILY. 06/19/20   [name], FNP  Spacer/Aero-Holding Chambers (AEROCHAMBER PLUS) inhaler Use as instructed 01/10/19   [name], FNP    Allergies    Codeine  Review of Systems   Review of Systems  Unable to perform ROS: Dementia    Physical Exam Updated Vital Signs BP (!) 182/84   Pulse 66   Temp 98 F (36.7 C) (Oral)   Resp 15   Ht 5\' 2"  (1.575 m)   Wt 75 kg   SpO2 97%   BMI 30.24 kg/m   Physical Exam Vitals and nursing note reviewed.  Constitutional:      Appearance: Normal appearance.  HENT:     Head: Normocephalic.     Mouth/Throat:     Mouth: Mucous membranes are moist.  Eyes:     Pupils: Pupils are equal, round, and reactive to light.  Cardiovascular:     Rate and Rhythm: Normal rate.  Pulmonary:     Effort: Pulmonary effort is normal. No respiratory distress.  Abdominal:     Palpations: Abdomen is soft.     Tenderness: There is no abdominal tenderness.  Musculoskeletal:        General: No swelling or deformity.  Skin:    General: Skin is warm.  Neurological:     Mental Status: She is alert.     Comments: Demented but reported to be at baseline  Psychiatric:        Mood and Affect: Mood normal.     ED Results / Procedures / Treatments   Labs (all labs ordered are listed, but only abnormal results are displayed) Labs Reviewed  BASIC METABOLIC PANEL - Abnormal; Notable for the following components:      Result Value   Glucose, Bld 105 (*)    Creatinine, Ser 1.26 (*)    GFR, Estimated 43 (*)    All other components within normal limits   CBC  URINALYSIS, ROUTINE W REFLEX MICROSCOPIC    EKG EKG Interpretation  Date/Time:  Tuesday July 10 2020 11:33:12 EST Ventricular Rate:  76 PR Interval:    QRS Duration: 110 QT Interval:  466 QTC Calculation: 524 R Axis:   91 Text Interpretation: Atrial fibrillation Rightward axis Anterior infarct , age undetermined Abnormal ECG AFIB, no changes from previous Confirmed by [id number] [phone]) on 07/10/2020 12:04:52 PM   Radiology CT HEAD WO CONTRAST  Result Date: 07/10/2020 CLINICAL DATA:  Syncope EXAM: CT HEAD WITHOUT CONTRAST TECHNIQUE: Contiguous axial images were obtained from the base of the skull through the vertex without intravenous contrast. COMPARISON:  05/14/2019 FINDINGS: Brain: There is no acute intracranial hemorrhage, mass effect, or edema. No new loss of gray-white differentiation. Chronic frontal, temporal, anterior insula, and right occipital infarctions. Additional patchy and confluent areas of hypoattenuation in the supratentorial white matter nonspecific but probably reflects stable chronic microvascular ischemic changes. There is no extra-axial fluid collection. Prominence of the ventricles and sulci reflects stable parenchymal volume loss. Vascular: There is atherosclerotic calcification at the skull base. Skull: Calvarium is unremarkable. Sinuses/Orbits: Mild mucosal thickening. Retained secretions in the left sphenoid sinus. Orbits are unremarkable. Other: Minimal patchy left mastoid opacification. IMPRESSION: No acute intracranial abnormality. Stable chronic/nonemergent findings detailed above. Electronically Signed   By: Guadlupe Spanish M.D.   On: 07/10/2020 12:36    Procedures Procedures (including critical care time)  Medications Ordered in ED Medications - No data to display  ED Course  I have reviewed the triage vital signs and the nursing notes.  Pertinent labs & imaging results that were available during my care of the patient were reviewed by me  and considered in my medical decision making (see chart for details).    MDM Rules/Calculators/A&P                          85 year old female presents the emergency department for reported syncope.  Patient is unable to provide history secondary to dementia.  We were able to get in contact with the husband.  He states that the patient was coming out of the bathroom with the caregiver.  She stood up from the toilet to come out and that is when she felt like she was going to pass out so the caregiver lowered her to the ground.  Unclear if there was a full loss of consciousness.  No seizure-like activity.  EKG shows atrial fibrillation with no acute changes.  Head CT shows chronic changes but no acute intracranial finding.  Blood work is reassuring dehydration but no other acute findings.  Vitals are normal, patient appears neurologically intact.  Urinalysis shows no infection.  Patient has been resting comfortably, demented but at baseline.  Spoke with the husband who would like her discharged home, they already have a caregiver in place, the son is able to come pick her up.  Using the [facility] syncope rules she does not require acute admission.  She is otherwise pleasant and stable at time of discharge.  Patient will be discharged and treated as an outpatient.  Discharge plan and strict return to ED precautions discussed, patient verbalizes understanding and agreement. Final Clinical Impression(s) / ED Diagnoses Final diagnoses:  None    Rx / DC Orders ED Discharge Orders    None       Rozelle Logan, DO 07/10/20 1633

## 2020-07-10 NOTE — ED Triage Notes (Signed)
Arrived via [facility] from home. Family called EMS while getting patient ready for the day patient had a syncope event. History of dementia family told EMS patient has good days and bad days and woke up with general fatigue. Sunday was a good day being active. CBG 146.

## 2020-07-10 NOTE — Discharge Instructions (Addendum)
You have been seen and discharged from the emergency department.  Follow-up with your primary provider for reevaluation. Take home medications as prescribed.  Stay well-hydrated.  If you have any worsening symptoms or further concerns for health please return to an emergency department for further evaluation. °

## 2020-07-10 NOTE — ED Notes (Signed)
Dr. Wilkie Aye notified of HTN

## 2020-07-10 NOTE — ED Notes (Signed)
Pt transported to CT ?

## 2020-07-11 ENCOUNTER — Telehealth: Payer: Self-pay | Admitting: *Deleted

## 2020-07-11 NOTE — Telephone Encounter (Signed)
Received call from patient's daughter stating that patient has passed out multiple times since yesterday and is having trouble coming back to today.  Patient was evaluated in ER yesterday and was sent home.  Daughter states that today is different patient is slurring words, left sided weakness and shaking in left arm.  Advised daughter to call 911 and have patient transported to ER.  Daughter verbalized understanding and states she will call

## 2020-07-12 ENCOUNTER — Encounter (HOSPITAL_COMMUNITY): Payer: Self-pay | Admitting: Internal Medicine

## 2020-07-12 ENCOUNTER — Telehealth: Payer: Self-pay

## 2020-07-12 ENCOUNTER — Emergency Department (HOSPITAL_COMMUNITY): Payer: Medicare Other

## 2020-07-12 ENCOUNTER — Inpatient Hospital Stay (HOSPITAL_COMMUNITY)
Admission: EM | Admit: 2020-07-12 | Discharge: 2020-07-16 | DRG: 312 | Disposition: A | Discharge status: Home Health | Payer: Medicare Other | Attending: Internal Medicine | Admitting: Internal Medicine

## 2020-07-12 ENCOUNTER — Observation Stay (HOSPITAL_COMMUNITY): Payer: Medicare Other

## 2020-07-12 DIAGNOSIS — J449 Chronic obstructive pulmonary disease, unspecified: Secondary | ICD-10-CM | POA: Diagnosis not present

## 2020-07-12 DIAGNOSIS — Z823 Family history of stroke: Secondary | ICD-10-CM

## 2020-07-12 DIAGNOSIS — R7989 Other specified abnormal findings of blood chemistry: Secondary | ICD-10-CM | POA: Diagnosis present

## 2020-07-12 DIAGNOSIS — Z885 Allergy status to narcotic agent status: Secondary | ICD-10-CM

## 2020-07-12 DIAGNOSIS — R778 Other specified abnormalities of plasma proteins: Secondary | ICD-10-CM | POA: Diagnosis present

## 2020-07-12 DIAGNOSIS — Z8673 Personal history of transient ischemic attack (TIA), and cerebral infarction without residual deficits: Secondary | ICD-10-CM

## 2020-07-12 DIAGNOSIS — Z96642 Presence of left artificial hip joint: Secondary | ICD-10-CM | POA: Diagnosis present

## 2020-07-12 DIAGNOSIS — J189 Pneumonia, unspecified organism: Secondary | ICD-10-CM

## 2020-07-12 DIAGNOSIS — I509 Heart failure, unspecified: Secondary | ICD-10-CM

## 2020-07-12 DIAGNOSIS — E782 Mixed hyperlipidemia: Secondary | ICD-10-CM | POA: Diagnosis present

## 2020-07-12 DIAGNOSIS — Z7901 Long term (current) use of anticoagulants: Secondary | ICD-10-CM

## 2020-07-12 DIAGNOSIS — F0150 Vascular dementia without behavioral disturbance: Secondary | ICD-10-CM | POA: Diagnosis present

## 2020-07-12 DIAGNOSIS — I482 Chronic atrial fibrillation, unspecified: Secondary | ICD-10-CM | POA: Diagnosis not present

## 2020-07-12 DIAGNOSIS — U071 COVID-19: Secondary | ICD-10-CM | POA: Diagnosis present

## 2020-07-12 DIAGNOSIS — Z8249 Family history of ischemic heart disease and other diseases of the circulatory system: Secondary | ICD-10-CM

## 2020-07-12 DIAGNOSIS — I160 Hypertensive urgency: Secondary | ICD-10-CM | POA: Diagnosis present

## 2020-07-12 DIAGNOSIS — Z79890 Hormone replacement therapy (postmenopausal): Secondary | ICD-10-CM

## 2020-07-12 DIAGNOSIS — Z79899 Other long term (current) drug therapy: Secondary | ICD-10-CM

## 2020-07-12 DIAGNOSIS — M436 Torticollis: Secondary | ICD-10-CM | POA: Diagnosis present

## 2020-07-12 DIAGNOSIS — I472 Ventricular tachycardia: Secondary | ICD-10-CM | POA: Diagnosis present

## 2020-07-12 DIAGNOSIS — I5022 Chronic systolic (congestive) heart failure: Secondary | ICD-10-CM | POA: Diagnosis not present

## 2020-07-12 DIAGNOSIS — N1832 Chronic kidney disease, stage 3b: Secondary | ICD-10-CM | POA: Diagnosis present

## 2020-07-12 DIAGNOSIS — Z66 Do not resuscitate: Secondary | ICD-10-CM | POA: Diagnosis present

## 2020-07-12 DIAGNOSIS — R55 Syncope and collapse: Secondary | ICD-10-CM | POA: Diagnosis present

## 2020-07-12 DIAGNOSIS — I493 Ventricular premature depolarization: Secondary | ICD-10-CM | POA: Diagnosis present

## 2020-07-12 DIAGNOSIS — I130 Hypertensive heart and chronic kidney disease with heart failure and stage 1 through stage 4 chronic kidney disease, or unspecified chronic kidney disease: Secondary | ICD-10-CM | POA: Diagnosis present

## 2020-07-12 DIAGNOSIS — I951 Orthostatic hypotension: Principal | ICD-10-CM | POA: Diagnosis present

## 2020-07-12 DIAGNOSIS — I248 Other forms of acute ischemic heart disease: Secondary | ICD-10-CM | POA: Diagnosis present

## 2020-07-12 DIAGNOSIS — I10 Essential (primary) hypertension: Secondary | ICD-10-CM

## 2020-07-12 DIAGNOSIS — E039 Hypothyroidism, unspecified: Secondary | ICD-10-CM | POA: Diagnosis present

## 2020-07-12 LAB — BASIC METABOLIC PANEL
Anion gap: 12 (ref 5–15)
BUN: 11 mg/dL (ref 8–23)
CO2: 21 mmol/L — ABNORMAL LOW (ref 22–32)
Calcium: 7.5 mg/dL — ABNORMAL LOW (ref 8.9–10.3)
Chloride: 105 mmol/L (ref 98–111)
Creatinine, Ser: 0.92 mg/dL (ref 0.44–1.00)
GFR, Estimated: >60 mL/min (ref >60)
Glucose, Bld: 165 mg/dL — ABNORMAL HIGH (ref 70–99)
Potassium: 3.5 mmol/L (ref 3.5–5.1)
Sodium: 138 mmol/L (ref 135–145)

## 2020-07-12 LAB — CBC
HCT: 41 % (ref 36.0–46.0)
Hemoglobin: 13.8 g/dL (ref 12.0–15.0)
MCH: 32.8 pg (ref 26.0–34.0)
MCHC: 33.7 g/dL (ref 30.0–36.0)
MCV: 97.4 fL (ref 80.0–100.0)
Platelets: 199 10*3/uL (ref 150–400)
RBC: 4.21 MIL/uL (ref 3.87–5.11)
RDW: 13.5 % (ref 11.5–15.5)
WBC: 5.2 10*3/uL (ref 4.0–10.5)
nRBC: 0 % (ref 0.0–0.2)

## 2020-07-12 LAB — TROPONIN I (HIGH SENSITIVITY)
Troponin I (High Sensitivity): 32 ng/L — ABNORMAL HIGH (ref <18)
Troponin I (High Sensitivity): 31 ng/L — ABNORMAL HIGH (ref <18)

## 2020-07-12 LAB — D-DIMER, QUANTITATIVE: D-Dimer, Quant: <0.27 ug/mL-FEU (ref 0.00–0.50)

## 2020-07-12 LAB — SARS CORONAVIRUS 2 (TAT 6-24 HRS): SARS Coronavirus 2: POSITIVE — AB

## 2020-07-12 LAB — TSH: TSH: 4.75 u[IU]/mL — ABNORMAL HIGH (ref 0.350–4.500)

## 2020-07-12 MED ORDER — MEMANTINE HCL ER 28 MG PO CP24
28.0000 mg | ORAL_CAPSULE | Freq: Every day | ORAL | Status: DC
  Administered 2020-07-13 – 2020-07-15 (×4): 28 mg via ORAL
  Filled 2020-07-12 (×6): qty 1

## 2020-07-12 MED ORDER — LABETALOL HCL 5 MG/ML IV SOLN
20.0000 mg | INTRAVENOUS | Status: DC | PRN
  Administered 2020-07-13 – 2020-07-14 (×2): 20 mg via INTRAVENOUS
  Filled 2020-07-12 (×2): qty 4

## 2020-07-12 MED ORDER — ONDANSETRON HCL 4 MG PO TABS: 4.0000 mg | ORAL_TABLET | Freq: Four times a day (QID) | ORAL | Status: DC | PRN

## 2020-07-12 MED ORDER — LOSARTAN POTASSIUM 50 MG PO TABS
100.0000 mg | ORAL_TABLET | Freq: Every day | ORAL | Status: DC
  Administered 2020-07-13 – 2020-07-16 (×4): 100 mg via ORAL
  Filled 2020-07-12 (×4): qty 2

## 2020-07-12 MED ORDER — MEMANTINE HCL-DONEPEZIL HCL ER 28-10 MG PO CP24: 1.0000 | ORAL_CAPSULE | Freq: Every day | ORAL | Status: DC

## 2020-07-12 MED ORDER — ALBUTEROL SULFATE HFA 108 (90 BASE) MCG/ACT IN AERS
2.0000 | INHALATION_SPRAY | Freq: Four times a day (QID) | RESPIRATORY_TRACT | Status: DC | PRN
  Filled 2020-07-12: qty 6.7

## 2020-07-12 MED ORDER — DONEPEZIL HCL 10 MG PO TABS
10.0000 mg | ORAL_TABLET | Freq: Every day | ORAL | Status: DC
  Administered 2020-07-13 (×2): 10 mg via ORAL
  Filled 2020-07-12 (×2): qty 1

## 2020-07-12 MED ORDER — ACETAMINOPHEN 325 MG PO TABS: 650.0000 mg | ORAL_TABLET | Freq: Four times a day (QID) | ORAL | Status: DC | PRN

## 2020-07-12 MED ORDER — LACTATED RINGERS IV SOLN: INTRAVENOUS | Status: AC

## 2020-07-12 MED ORDER — LEVOTHYROXINE SODIUM 25 MCG PO TABS
12.5000 ug | ORAL_TABLET | Freq: Every day | ORAL | Status: DC
  Administered 2020-07-13 – 2020-07-16 (×4): 12.5 ug via ORAL
  Filled 2020-07-12 (×4): qty 1

## 2020-07-12 MED ORDER — METOPROLOL SUCCINATE ER 50 MG PO TB24
50.0000 mg | ORAL_TABLET | Freq: Every day | ORAL | Status: DC
  Administered 2020-07-13: 50 mg via ORAL
  Filled 2020-07-12 (×2): qty 1

## 2020-07-12 MED ORDER — ONDANSETRON HCL 4 MG/2ML IJ SOLN: 4.0000 mg | Freq: Four times a day (QID) | INTRAMUSCULAR | Status: DC | PRN

## 2020-07-12 MED ORDER — ACETAMINOPHEN 650 MG RE SUPP: 650.0000 mg | Freq: Four times a day (QID) | RECTAL | Status: DC | PRN

## 2020-07-12 MED ORDER — HYDRALAZINE HCL 20 MG/ML IJ SOLN
10.0000 mg | Freq: Four times a day (QID) | INTRAMUSCULAR | Status: DC | PRN
  Filled 2020-07-12: qty 1

## 2020-07-12 MED ORDER — APIXABAN 5 MG PO TABS
5.0000 mg | ORAL_TABLET | Freq: Two times a day (BID) | ORAL | Status: DC
  Administered 2020-07-13 – 2020-07-16 (×8): 5 mg via ORAL
  Filled 2020-07-12 (×8): qty 1

## 2020-07-12 MED ORDER — POLYETHYLENE GLYCOL 3350 17 G PO PACK: 17.0000 g | PACK | Freq: Every day | ORAL | Status: DC | PRN

## 2020-07-12 MED ORDER — ATORVASTATIN CALCIUM 10 MG PO TABS
20.0000 mg | ORAL_TABLET | Freq: Every day | ORAL | Status: DC
  Administered 2020-07-13 – 2020-07-15 (×4): 20 mg via ORAL
  Filled 2020-07-12 (×4): qty 2

## 2020-07-12 MED ORDER — HYDRALAZINE HCL 50 MG PO TABS
100.0000 mg | ORAL_TABLET | Freq: Three times a day (TID) | ORAL | Status: DC
  Administered 2020-07-13 – 2020-07-16 (×12): 100 mg via ORAL
  Filled 2020-07-12 (×13): qty 2

## 2020-07-12 NOTE — H&P (Signed)
History and Physical    Coral CeoMyrtle J Windle ZHY:[id number]:[id number] DOB: [birth year] DOA: 07/12/2020  PCP: Gwenlyn FudgeJoyce, Britney F, FNP  Patient coming from: Home via EMS   Chief Complaint:  Chief Complaint  Patient presents with  . Loss of Consciousness     HPI:    85 year old female with past medical history of chronic kidney disease stage IIIb, systolic congestive heart failure (Echo 04/2019 EF 40-45%), hyperlipidemia, vascular dementia, chronic atrial fibrillation, hypertension, COPD and previous CVA with 2 episodes of loss of consciousness witnessed by family.  Of note, patient has been hospitalized at [facility] in November and again in December 2020 for episodes of loss of consciousness.  Both of those work-ups could not identify an etiology.  Patient is an extremely poor historian due to advanced dementia.  Majority the history that I have obtained is from discussions with the emergency department staff as well as outpatient records.  Yesterday on 1/19 while the patient was home the patient complained of a headache and lightheadedness while she was standing.  Shortly after she began to walk she had an episode of loss of consciousness which was witnessed.  There was a questionable period of "shaking" of the left arm after the initial episode.    The following day, the patient happened to be ambulating from the bathroom when she looked extremely lightheaded and about to pass out so family helped to lower her to the floor where she seemed to have lost consciousness anyway for a period of approximately 2 minutes.  No shaking activity this time.   At this point, 911 was contacted.  EMS promptly arrived but by the time they arrived patient's mentation was already back at baseline.  Patient was then brought into [facility]  emergency department for evaluation.   Upon evaluation in the emergency department, patient's caregiver was with her to speak to the emergency department provider.  The  caregiver reports that the patient has "just not felt good" lately no other specific symptoms could be offered.    In the emergency department, CT imaging of the head was performed revealing no evidence of acute disease.    A chest x-ray was performed revealing no evidence of pneumonia.  A troponin was also performed and found to be mildly elevated at 32.  EKG revealed controlled atrial fibrillation at 82 bpm.  Etiology of frequent bouts of loss of consciousness was still not clear and therefore the hospitalist group was called to assess the patient for admission to the hospital.  Review of Systems:   Review of Systems  Unable to perform ROS: Mental status change    Past Medical History:  Diagnosis Date  . Atrial fibrillation (HCC)   . COPD (chronic obstructive pulmonary disease) (HCC)   . Dyslipidemia   . Essential hypertension   . History of cardiac catheterization 11/2013   Normal coronaries  . History of stroke    Right MCA distribution     Past Surgical History:  Procedure Laterality Date  . ABDOMINAL HYSTERECTOMY  1975  . FRACTURE SURGERY Left   . LEFT HEART CATHETERIZATION WITH CORONARY ANGIOGRAM N/A 11/23/2013   Procedure: LEFT HEART CATHETERIZATION WITH CORONARY ANGIOGRAM;  Surgeon: Kathleene Hazelhristopher D McAlhany, MD;  [facility];  Service: Cardiovascular;  Laterality: N/A;  . LEFT HIP HEMIARTHROPLASTY    . TRIBUTARY VARICOSITIES OF RIGHT LEG  05/23/2002  . VEIN LIGATION AND STRIPPING Right 02/08/2013   Procedure: Segmental excision of painful varicose veins right lower extremity;  Surgeon: Chuck Hint, MD;  Location: [facility] OR;  Service: Vascular;  Laterality: Right;     reports that she has never smoked. She has never used smokeless tobacco. She reports that she does not drink alcohol and does not use drugs.  Allergies  Allergen Reactions  . Codeine Nausea And Vomiting    Family History  Problem Relation Age of Onset  . Stroke Father   . Cancer Mother   .  Hypertension Mother   . Cancer Sister   . Cancer Sister   . Cancer Sister   . Hypertension Son      Prior to Admission medications   Medication Sig Start Date End Date Taking? Authorizing Provider  acetaminophen (TYLENOL) 500 MG tablet Take 1,000 mg by mouth every 6 (six) hours as needed for headache (pain).   Yes [provider]  albuterol (VENTOLIN HFA) 108 (90 Base) MCG/ACT inhaler Inhale 2 puffs into the lungs every 6 (six) hours as needed for wheezing or shortness of breath. 01/10/19  Yes Deliah Boston F, FNP  atorvastatin (LIPITOR) 20 MG tablet TAKE (1) TABLET DAILY AT BEDTIME. Patient taking differently: Take 20 mg by mouth at bedtime. 06/18/20  Yes Deliah Boston F, FNP  diclofenac Sodium (VOLTAREN) 1 % GEL Apply 2 g topically 4 (four) times daily. 06/05/20  Yes Ijaola, Feliberto Harts, NP  ELIQUIS 5 MG TABS tablet TAKE ONE TABLET TWICE A DAY. Patient taking differently: Take 5 mg by mouth 2 (two) times daily. 05/29/20  Yes Martin, Mary-Margaret, FNP  furosemide (LASIX) 40 MG tablet Take 1 tablet (40 mg total) by mouth daily. 12/02/19  Yes Deliah Boston F, FNP  hydrALAZINE (APRESOLINE) 100 MG tablet Take 1 tablet (100 mg total) by mouth every 8 (eight) hours. 12/02/19  Yes Deliah Boston F, FNP  levothyroxine (SYNTHROID) 25 MCG tablet TAKE (1/2) TABLET DAILY. Patient taking differently: Take 12.5 mcg by mouth daily. 12/02/19  Yes [name], FNP  losartan (COZAAR) 100 MG tablet Take 1 tablet (100 mg total) by mouth daily. 05/21/20  Yes Hawks, Christy A, FNP  metoprolol succinate (TOPROL XL) 50 MG 24 hr tablet Take 1 tablet (50 mg total) by mouth daily. 12/27/19  Yes Jonelle Sidle, MD  NAMZARIC 28-10 MG CP24 TAKE (1) CAPSULE DAILY AT BEDTIME. Patient taking differently: Take 1 capsule by mouth at bedtime. 06/26/20  Yes Gottschalk, Kathie Rhodes M, DO  potassium chloride SA (KLOR-CON) 20 MEQ tablet TAKE 2 TABLETS ONCE DAILY. Patient taking differently: Take 20 mEq by mouth 2 (two) times  daily. 06/19/20  Yes Deliah Boston F, FNP  Spacer/Aero-Holding Chambers (AEROCHAMBER PLUS) inhaler Use as instructed 01/10/19  Yes [name], FNP    Physical Exam: Vitals:   07/12/20 1630 07/12/20 1700 07/12/20 1825 07/12/20 1957  BP: (!) 169/74 (!) 175/82 (!) 185/81 (!) 187/85  Pulse: 63 63 77 66  Resp: 15 20 17 12   Temp:  98.2 F (36.8 C) 98.8 F (37.1 C) 98.3 F (36.8 C)  TempSrc:  Axillary Rectal Oral  SpO2: 95% 94% 96% 97%  Weight:   71.6 kg     Constitutional: Lethargic but arousable and oriented x1, no associated distress.   Skin: no rashes, no lesions, poor skin turgor noted.. Eyes: Pupils are equally reactive to light.  No evidence of scleral icterus or conjunctival pallor.  ENMT: Dry mucous membranes noted.  Posterior pharynx clear of any exudate or lesions.   Neck: normal, supple, no masses, no thyromegaly.  No evidence of  jugular venous distension.   Respiratory: clear to auscultation bilaterally, no wheezing, no crackles. Normal respiratory effort. No accessory muscle use.  Cardiovascular: Irregularly irregular rhythm with controlled rate, no murmurs / rubs / gallops. No extremity edema. 2+ pedal pulses. No carotid bruits.  Chest:   Nontender without crepitus or deformity.   Back:   Nontender without crepitus or deformity. Abdomen: Abdomen is soft and nontender.  No evidence of intra-abdominal masses.  Positive bowel sounds noted in all quadrants.   Musculoskeletal: No joint deformity upper and lower extremities. Good ROM, no contractures. Normal muscle tone.  Neurologic:  Sensation intact.  Patient moving all 4 extremities spontaneously.  Patient only intermittently follows commands.  Patient is responsive to verbal stimuli.   Psychiatric: Patient exhibits normal mood with appropriate affect.  Patient currently does not seem to possess insight as to his current situation.   Labs on Admission: I have personally reviewed following labs and imaging studies -    CBC: Recent Labs  Lab 07/10/20 1122 07/12/20 1124  WBC 4.9 5.2  HGB 14.0 13.8  HCT 41.5 41.0  MCV 95.8 97.4  PLT 234 199   Basic Metabolic Panel: Recent Labs  Lab 07/10/20 1122 07/12/20 1124  NA 142 138  K 3.7 3.5  CL 105 105  CO2 26 21*  GLUCOSE 105* 165*  BUN 11 11  CREATININE 1.26* 0.92  CALCIUM 9.1 7.5*   GFR: Estimated Creatinine Clearance: 43.7 mL/min (by C-G formula based on SCr of 0.92 mg/dL). Liver Function Tests: No results for input(s): AST, ALT, ALKPHOS, BILITOT, PROT, ALBUMIN in the last 168 hours. No results for input(s): LIPASE, AMYLASE in the last 168 hours. No results for input(s): AMMONIA in the last 168 hours. Coagulation Profile: No results for input(s): INR, PROTIME in the last 168 hours. Cardiac Enzymes: No results for input(s): CKTOTAL, CKMB, CKMBINDEX, TROPONINI in the last 168 hours. BNP (last 3 results) No results for input(s): PROBNP in the last 8760 hours. HbA1C: No results for input(s): HGBA1C in the last 72 hours. CBG: No results for input(s): GLUCAP in the last 168 hours. Lipid Profile: No results for input(s): CHOL, HDL, LDLCALC, TRIG, CHOLHDL, LDLDIRECT in the last 72 hours. Thyroid Function Tests: Recent Labs    07/12/20 1157  TSH 4.750*   Anemia Panel: No results for input(s): VITAMINB12, FOLATE, FERRITIN, TIBC, IRON, RETICCTPCT in the last 72 hours. Urine analysis:    Component Value Date/Time   COLORURINE YELLOW 07/10/2020 1356   APPEARANCEUR HAZY (A) 07/10/2020 1356   APPEARANCEUR Clear 02/17/2019 1154   LABSPEC 1.021 07/10/2020 1356   PHURINE 5.0 07/10/2020 1356   GLUCOSEU NEGATIVE 07/10/2020 1356   HGBUR NEGATIVE 07/10/2020 1356   BILIRUBINUR NEGATIVE 07/10/2020 1356   BILIRUBINUR Negative 02/17/2019 1154   KETONESUR NEGATIVE 07/10/2020 1356   PROTEINUR 30 (A) 07/10/2020 1356   UROBILINOGEN 1.0 02/08/2013 0911   NITRITE NEGATIVE 07/10/2020 1356   LEUKOCYTESUR NEGATIVE 07/10/2020 1356    Radiological  Exams on Admission - Personally Reviewed: DG Chest 1 View  Result Date: 07/12/2020 CLINICAL DATA:  Syncope EXAM: CHEST  1 VIEW COMPARISON:  05/14/2019 FINDINGS: Cardiomegaly. No focal opacity or pleural effusion. Aortic atherosclerosis. No pneumothorax. IMPRESSION: No active disease. Cardiomegaly. Electronically Signed   By: Jasmine Pang M.D.   On: 07/12/2020 16:19   CT Head Wo Contrast  Result Date: 07/12/2020 CLINICAL DATA:  Recurrent syncopal episodes. EXAM: CT HEAD WITHOUT CONTRAST TECHNIQUE: Contiguous axial images were obtained from the base of the skull through  the vertex without intravenous contrast. COMPARISON:  Head CT 07/10/2020 FINDINGS: Brain: Stable age related cerebral atrophy, ventriculomegaly and periventricular white matter disease. Remote right temporal/parietal infarct with encephalomalacia. Remote right occipital infarct. No extra-axial fluid collections are identified. No CT findings for acute hemispheric infarction or intracranial hemorrhage. No mass lesions. The brainstem and cerebellum are normal. Vascular: Stable vascular calcifications.  No hyperdense vessels. Skull: No acute skull fracture or worrisome bone lesions. Stable hyperostosis frontalis interna. Sinuses/Orbits: The paranasal sinuses and mastoid air cells are clear. The globes are intact. Other: No scalp lesions or scalp hematoma. IMPRESSION: 1. Stable age related cerebral atrophy, ventriculomegaly and periventricular white matter disease. 2. Remote right temporal/parietal and right occipital infarcts. 3. No acute intracranial findings or mass lesions. Electronically Signed   By: Rudie Meyer M.D.   On: 07/12/2020 13:41   DG Abdomen Acute W/Chest  Result Date: 07/12/2020 CLINICAL DATA:  Abdominal pain EXAM: DG ABDOMEN ACUTE WITH 1 VIEW CHEST COMPARISON:  None. FINDINGS: There are nondilated air-filled loops of small bowel in the right abdomen and pelvis. No free air below the diaphragms. Lungs are clear. Partially  imaged left hip arthroplasty. Lumbar spine degenerative changes. IMPRESSION: Nonobstructive bowel gas pattern. Electronically Signed   By: Guadlupe Spanish M.D.   On: 07/12/2020 12:17    EKG: Personally reviewed.  Rhythm is atrial fibrillation with heart rate of 80 bpm.  No dynamic ST segment changes appreciated.  Assessment/Plan Principal Problem:   Syncope   Patient presenting with at least 2 episodes of witnessed loss of consciousness in the past 2 days  Unclear as to whether patient is experiencing episodes of syncope or seizure, although the fact that patient is experiencing these episodes after a change in position suggest that this may be orthostatic syncope  Monitoring patient on telemetry overnight  Gently hydrating patient with intravenous isotonic fluids  Ensuring that patient's blood pressures are well controlled considering patient is presenting with hypertensive urgency  Cycling cardiac enzymes  Obtaining echocardiogram  Obtaining EEG  Evaluating for any evidence of infection including urinalysis chest x-ray and COVID testing  Patient may benefit from outpatient event monitor at time of discharge if etiology is still not clear  Active Problems:   Hypertensive urgency   Patient presenting to the medical floor with markedly elevated blood pressures   Resuming home regimen of antihypertensives  Additionally providing as needed intravenous interpreters for markedly elevated blood pressure  Regimen will need to slowly be titrated to achieve excellent blood pressure control  Elevated troponin not due to myocardial infarction   Patient is currently chest pain-free  Initial troponins are slightly elevated with flat trajectory making plaque rupture extremely unlikely  Obtaining echocardiogram  Monitoring patient on telemetry  Continuing to cycle cardiac enzymes    COPD (chronic obstructive pulmonary disease) (HCC)   No evidence of COPD exacerbation  As  needed bronchodilator therapy for shortness of breath and wheezing    Chronic atrial fibrillation (HCC)   Patient is currently rate controlled  Continue home regimen of anticoagulation  Monitoring patient on telemetry    Vascular dementia without behavioral disturbance (HCC)   Longstanding known history of advanced vascular dementia making obtaining history difficult  Multiple times of the made to contact family including calling the niece and spouse on multiple attempts to get a corroborating history  Minimizing painful stimuli frequently redirecting patient as needed     Chronic systolic CHF (congestive heart failure) (HCC)   No clinical evidence of volume overload  at this time    Mixed hyperlipidemia    Continue home regimen of statin therapy   Code Status:  Full code -while patient has been documented as DNR in the past we have been unsuccessful in getting in touch with family to confirm that patient is indeed DNR.  Patient unfortunately will need to remain as a full code at this time until DNR status is confirmed. Family Communication: Multiple attempts have been made to contact multiple family members unsuccessfully.  Status is: Observation  The patient remains OBS appropriate and will d/c before 2 midnights.  Dispo: The patient is from: Home              Anticipated d/c is to: Home              Anticipated d/c date is: 2 days              Patient currently is not medically stable to d/c.        Marinda Elk MD [facility] Pager [phone]  If 7PM-7AM, please contact night-coverage [email or web address] Use universal [place] password for that web site. If you do not have the password, please call the hospital operator.  07/12/2020, 10:49 PM

## 2020-07-12 NOTE — Telephone Encounter (Signed)
Spoke with Doreene Burke.  Boneta Lucks states that patient had another syncope episode this morning.  Patient had clammy hands and face and very lethargic.  Boneta Lucks called EMS to have patient transported to [facility] for Evaluation.  Patient was seen in ER on Monday 07/09/20 and was discharged after 3 hours of evaluation.  Boneta Lucks thinks that patient needs to be evaluated over night and tomorrow morning. Syncope episode normally happen in the morning.

## 2020-07-12 NOTE — ED Provider Notes (Signed)
[facility] EMERGENCY DEPARTMENT Provider Note   CSN: [id number] Arrival date & time: 07/12/20  1112     History Chief Complaint  Patient presents with  . Loss of Consciousness    Wanda Mckenzie is a 85 y.o. female. Level five caveat due to dementia. HPI Patient presents with syncopal episode.  Reportedly was at home.  Reportedly felt a headache and lightheaded while she was standing.  Walked little more than passed out.  Reportedly unresponsive about 2 minutes.  Return to baseline now.  Reviewing records patient had an episode yesterday where she went unresponsive and reportedly was shaking on the left side.  Patient's baseline dementia is only alert and oriented to self.  History comes from patient's caregiver who is with her.  States the patient's son has more power of attorney for her but he will not be reachable until at least 4:00.  Patient states she just does not feel good.    Past Medical History:  Diagnosis Date  . Atrial fibrillation (HCC)   . COPD (chronic obstructive pulmonary disease) (HCC)   . Dyslipidemia   . Essential hypertension   . History of cardiac catheterization 11/2013   Normal coronaries  . History of stroke    Right MCA distribution     Patient Active Problem List   Diagnosis Date Noted  . Acute bilateral low back pain with sciatica 06/05/2020  . Nonischemic cardiomyopathy (HCC) 06/02/2019  . Stage 3b chronic kidney disease (HCC) 05/14/2019  . Elevated TSH 04/13/2019  . Chronic congestive heart failure (HCC) 01/10/2019  . Late effects of CVA (cerebrovascular accident) 04/06/2018  . Hyperlipidemia 06/19/2016  . Vascular dementia without behavioral disturbance (HCC) 06/08/2014  . Varicose veins of lower extremities with other complications 02/02/2013  . Essential hypertension   . History of stroke   . Aortic insufficiency   . COPD (chronic obstructive pulmonary disease) (HCC)   . Chronic atrial fibrillation (HCC)   . Carotid  bruit     Past Surgical History:  Procedure Laterality Date  . ABDOMINAL HYSTERECTOMY  1975  . FRACTURE SURGERY Left   . LEFT HEART CATHETERIZATION WITH CORONARY ANGIOGRAM N/A 11/23/2013   Procedure: LEFT HEART CATHETERIZATION WITH CORONARY ANGIOGRAM;  Surgeon: Kathleene Hazel, MD;  Location: [facility] CATH LAB;  Service: Cardiovascular;  Laterality: N/A;  . LEFT HIP HEMIARTHROPLASTY    . TRIBUTARY VARICOSITIES OF RIGHT LEG  05/23/2002  . VEIN LIGATION AND STRIPPING Right 02/08/2013   Procedure: Segmental excision of painful varicose veins right lower extremity;  Surgeon: Chuck Hint, MD;  Location: [facility] OR;  Service: Vascular;  Laterality: Right;     OB History   No obstetric history on file.     Family History  Problem Relation Age of Onset  . Stroke Father   . Cancer Mother   . Hypertension Mother   . Cancer Sister   . Cancer Sister   . Cancer Sister   . Hypertension Son     Social History   Tobacco Use  . Smoking status: Never Smoker  . Smokeless tobacco: Never Used  Vaping Use  . Vaping Use: Never used  Substance Use Topics  . Alcohol use: No    Alcohol/week: 0.0 standard drinks  . Drug use: No    Home Medications Prior to Admission medications   Medication Sig Start Date End Date Taking? Authorizing Provider  acetaminophen (TYLENOL) 500 MG tablet Take 1,000 mg by mouth every 6 (six) hours as needed for  headache (pain).    [provider]  albuterol (VENTOLIN HFA) 108 (90 Base) MCG/ACT inhaler Inhale 2 puffs into the lungs every 6 (six) hours as needed for wheezing or shortness of breath. 01/10/19   [name], FNP  atorvastatin (LIPITOR) 20 MG tablet TAKE (1) TABLET DAILY AT BEDTIME. 06/18/20   Deliah Boston F, FNP  diclofenac Sodium (VOLTAREN) 1 % GEL Apply 2 g topically 4 (four) times daily. 06/05/20   Daryll Drown, NP  ELIQUIS 5 MG TABS tablet TAKE ONE TABLET TWICE A DAY. 05/29/20   Daphine Deutscher, Mary-Margaret, FNP  furosemide (LASIX) 40  MG tablet Take 1 tablet (40 mg total) by mouth daily. 12/02/19   [name], FNP  hydrALAZINE (APRESOLINE) 100 MG tablet Take 1 tablet (100 mg total) by mouth every 8 (eight) hours. 12/02/19   [name], FNP  levothyroxine (SYNTHROID) 25 MCG tablet TAKE (1/2) TABLET DAILY. 12/02/19   [name], FNP  losartan (COZAAR) 100 MG tablet Take 1 tablet (100 mg total) by mouth daily. 05/21/20   Jannifer Rodney A, FNP  metoprolol succinate (TOPROL XL) 50 MG 24 hr tablet Take 1 tablet (50 mg total) by mouth daily. 12/27/19   Jonelle Sidle, MD  NAMZARIC 28-10 MG CP24 TAKE (1) CAPSULE DAILY AT BEDTIME. Patient taking differently: Take 1 capsule by mouth at bedtime. 06/26/20   Raliegh Ip, DO  potassium chloride SA (KLOR-CON) 20 MEQ tablet TAKE 2 TABLETS ONCE DAILY. 06/19/20   [name], FNP  Spacer/Aero-Holding Chambers (AEROCHAMBER PLUS) inhaler Use as instructed 01/10/19   [name], FNP    Allergies    Codeine  Review of Systems   Review of Systems  Unable to perform ROS: Dementia  Neurological: Positive for syncope.    Physical Exam Updated Vital Signs BP (!) 157/79   Pulse 73   Resp 16   SpO2 94%   Physical Exam Vitals and nursing note reviewed.  Constitutional:      Appearance: Normal appearance.  HENT:     Head: Atraumatic.     Right Ear: External ear normal.     Left Ear: External ear normal.  Eyes:     Pupils: Pupils are equal, round, and reactive to light.  Cardiovascular:     Rate and Rhythm: Normal rate. Rhythm irregular.  Pulmonary:     Breath sounds: No wheezing or rhonchi.  Abdominal:     Tenderness: There is no abdominal tenderness.  Musculoskeletal:        General: No tenderness.  Skin:    General: Skin is warm.     Capillary Refill: Capillary refill takes less than 2 seconds.  Neurological:     Comments: Awake and pleasant but some confusion.  Moves all extremities.     ED Results / Procedures / Treatments   Labs (all  labs ordered are listed, but only abnormal results are displayed) Labs Reviewed  BASIC METABOLIC PANEL - Abnormal; Notable for the following components:      Result Value   CO2 21 (*)    Glucose, Bld 165 (*)    Calcium 7.5 (*)    All other components within normal limits  TSH - Abnormal; Notable for the following components:   TSH 4.750 (*)    All other components within normal limits  TROPONIN I (HIGH SENSITIVITY) - Abnormal; Notable for the following components:   Troponin I (High Sensitivity) 32 (*)    All other components within normal  limits  CBC  URINALYSIS, ROUTINE W REFLEX MICROSCOPIC  CBG MONITORING, ED  TROPONIN I (HIGH SENSITIVITY)    EKG EKG Interpretation  Date/Time:  Thursday July 12 2020 11:25:15 EST Ventricular Rate:  80 PR Interval:    QRS Duration: 116 QT Interval:  456 QTC Calculation: 527 R Axis:   71 Text Interpretation: Atrial fibrillation Nonspecific intraventricular conduction delay Nonspecific repol abnormality, diffuse leads Confirmed by Benjiman Core [phone]) on 07/12/2020 11:33:37 AM   Radiology CT Head Wo Contrast  Result Date: 07/12/2020 CLINICAL DATA:  Recurrent syncopal episodes. EXAM: CT HEAD WITHOUT CONTRAST TECHNIQUE: Contiguous axial images were obtained from the base of the skull through the vertex without intravenous contrast. COMPARISON:  Head CT 07/10/2020 FINDINGS: Brain: Stable age related cerebral atrophy, ventriculomegaly and periventricular white matter disease. Remote right temporal/parietal infarct with encephalomalacia. Remote right occipital infarct. No extra-axial fluid collections are identified. No CT findings for acute hemispheric infarction or intracranial hemorrhage. No mass lesions. The brainstem and cerebellum are normal. Vascular: Stable vascular calcifications.  No hyperdense vessels. Skull: No acute skull fracture or worrisome bone lesions. Stable hyperostosis frontalis interna. Sinuses/Orbits: The paranasal sinuses  and mastoid air cells are clear. The globes are intact. Other: No scalp lesions or scalp hematoma. IMPRESSION: 1. Stable age related cerebral atrophy, ventriculomegaly and periventricular white matter disease. 2. Remote right temporal/parietal and right occipital infarcts. 3. No acute intracranial findings or mass lesions. Electronically Signed   By: Rudie Meyer M.D.   On: 07/12/2020 13:41   DG Abdomen Acute W/Chest  Result Date: 07/12/2020 CLINICAL DATA:  Abdominal pain EXAM: DG ABDOMEN ACUTE WITH 1 VIEW CHEST COMPARISON:  None. FINDINGS: There are nondilated air-filled loops of small bowel in the right abdomen and pelvis. No free air below the diaphragms. Lungs are clear. Partially imaged left hip arthroplasty. Lumbar spine degenerative changes. IMPRESSION: Nonobstructive bowel gas pattern. Electronically Signed   By: Guadlupe Spanish M.D.   On: 07/12/2020 12:17    Procedures Procedures (including critical care time)  Medications Ordered in ED Medications - No data to display  ED Course  I have reviewed the triage vital signs and the nursing notes.  Pertinent labs & imaging results that were available during my care of the patient were reviewed by me and considered in my medical decision making (see chart for details).    MDM Rules/Calculators/A&P                          Patient presents with recurrent syncopal episodes.  Reportedly episodes today and some episodes yesterday.  With the episode yesterday was shaking on the left side and potentially seem more like a seizure.  However today was just more of an unresponsive episode.  Seen a few days ago for an unresponsive episode.  However patient has history of A. fib and cardiomyopathy.  Unable to talk with family numbers at this time.  However with high risk syncope I feels the patient benefit from admission to the hospital for further evaluation.  Will discuss with hospitalist.  Initial troponin mildly elevated.  Lab work reviewed.   Final  Clinical Impression(s) / ED Diagnoses Final diagnoses:  Syncope, unspecified syncope type    Rx / DC Orders ED Discharge Orders    None       Benjiman Core, MD 07/12/20 1528

## 2020-07-12 NOTE — ED Triage Notes (Signed)
Patient BIB [facility] from home where she lives with family after syncopal event. Family reported to EMS patient was ambulating back from bathroom when she looked like she was going to pass out so they lowered her to the floor. Patient was unresponsive for approximately 2 minutes. Patient alert and oriented to self when EMS arrived which is patient's baseline. 20g saline lock in left hand.

## 2020-07-12 NOTE — ED Notes (Signed)
Doreene Burke pt niece called for an update. Please call [phone]

## 2020-07-13 ENCOUNTER — Inpatient Hospital Stay (HOSPITAL_COMMUNITY): Payer: Medicare Other

## 2020-07-13 ENCOUNTER — Observation Stay (HOSPITAL_COMMUNITY): Payer: Medicare Other

## 2020-07-13 DIAGNOSIS — Z66 Do not resuscitate: Secondary | ICD-10-CM | POA: Diagnosis present

## 2020-07-13 DIAGNOSIS — J449 Chronic obstructive pulmonary disease, unspecified: Secondary | ICD-10-CM | POA: Diagnosis present

## 2020-07-13 DIAGNOSIS — R55 Syncope and collapse: Secondary | ICD-10-CM | POA: Diagnosis present

## 2020-07-13 DIAGNOSIS — Z7901 Long term (current) use of anticoagulants: Secondary | ICD-10-CM | POA: Diagnosis not present

## 2020-07-13 DIAGNOSIS — I248 Other forms of acute ischemic heart disease: Secondary | ICD-10-CM | POA: Diagnosis present

## 2020-07-13 DIAGNOSIS — F0150 Vascular dementia without behavioral disturbance: Secondary | ICD-10-CM

## 2020-07-13 DIAGNOSIS — Z96642 Presence of left artificial hip joint: Secondary | ICD-10-CM | POA: Diagnosis present

## 2020-07-13 DIAGNOSIS — I340 Nonrheumatic mitral (valve) insufficiency: Secondary | ICD-10-CM

## 2020-07-13 DIAGNOSIS — M542 Cervicalgia: Secondary | ICD-10-CM | POA: Diagnosis not present

## 2020-07-13 DIAGNOSIS — Z79890 Hormone replacement therapy (postmenopausal): Secondary | ICD-10-CM | POA: Diagnosis not present

## 2020-07-13 DIAGNOSIS — I130 Hypertensive heart and chronic kidney disease with heart failure and stage 1 through stage 4 chronic kidney disease, or unspecified chronic kidney disease: Secondary | ICD-10-CM | POA: Diagnosis present

## 2020-07-13 DIAGNOSIS — I160 Hypertensive urgency: Secondary | ICD-10-CM | POA: Diagnosis present

## 2020-07-13 DIAGNOSIS — Z8673 Personal history of transient ischemic attack (TIA), and cerebral infarction without residual deficits: Secondary | ICD-10-CM | POA: Diagnosis not present

## 2020-07-13 DIAGNOSIS — I5022 Chronic systolic (congestive) heart failure: Secondary | ICD-10-CM | POA: Diagnosis present

## 2020-07-13 DIAGNOSIS — Z823 Family history of stroke: Secondary | ICD-10-CM | POA: Diagnosis not present

## 2020-07-13 DIAGNOSIS — E782 Mixed hyperlipidemia: Secondary | ICD-10-CM | POA: Diagnosis present

## 2020-07-13 DIAGNOSIS — U071 COVID-19: Secondary | ICD-10-CM | POA: Diagnosis present

## 2020-07-13 DIAGNOSIS — I493 Ventricular premature depolarization: Secondary | ICD-10-CM | POA: Diagnosis present

## 2020-07-13 DIAGNOSIS — I472 Ventricular tachycardia: Secondary | ICD-10-CM | POA: Diagnosis present

## 2020-07-13 DIAGNOSIS — I951 Orthostatic hypotension: Secondary | ICD-10-CM | POA: Diagnosis present

## 2020-07-13 DIAGNOSIS — I482 Chronic atrial fibrillation, unspecified: Secondary | ICD-10-CM

## 2020-07-13 DIAGNOSIS — N1832 Chronic kidney disease, stage 3b: Secondary | ICD-10-CM | POA: Diagnosis present

## 2020-07-13 DIAGNOSIS — Z8249 Family history of ischemic heart disease and other diseases of the circulatory system: Secondary | ICD-10-CM | POA: Diagnosis not present

## 2020-07-13 DIAGNOSIS — M436 Torticollis: Secondary | ICD-10-CM | POA: Diagnosis present

## 2020-07-13 DIAGNOSIS — Z885 Allergy status to narcotic agent status: Secondary | ICD-10-CM | POA: Diagnosis not present

## 2020-07-13 DIAGNOSIS — E039 Hypothyroidism, unspecified: Secondary | ICD-10-CM | POA: Diagnosis present

## 2020-07-13 DIAGNOSIS — Z79899 Other long term (current) drug therapy: Secondary | ICD-10-CM | POA: Diagnosis not present

## 2020-07-13 LAB — CBC WITH DIFFERENTIAL/PLATELET
Abs Immature Granulocytes: 0.01 10*3/uL (ref 0.00–0.07)
Basophils Absolute: 0 10*3/uL (ref 0.0–0.1)
Basophils Relative: 1 %
Eosinophils Absolute: 0 10*3/uL (ref 0.0–0.5)
Eosinophils Relative: 1 %
HCT: 39.7 % (ref 36.0–46.0)
Hemoglobin: 13.7 g/dL (ref 12.0–15.0)
Immature Granulocytes: 0 %
Lymphocytes Relative: 52 %
Lymphs Abs: 2.1 10*3/uL (ref 0.7–4.0)
MCH: 32.7 pg (ref 26.0–34.0)
MCHC: 34.5 g/dL (ref 30.0–36.0)
MCV: 94.7 fL (ref 80.0–100.0)
Monocytes Absolute: 0.5 10*3/uL (ref 0.1–1.0)
Monocytes Relative: 13 %
Neutro Abs: 1.3 10*3/uL — ABNORMAL LOW (ref 1.7–7.7)
Neutrophils Relative %: 33 %
Platelets: 182 10*3/uL (ref 150–400)
RBC: 4.19 MIL/uL (ref 3.87–5.11)
RDW: 13.4 % (ref 11.5–15.5)
WBC: 4.1 10*3/uL (ref 4.0–10.5)
nRBC: 0 % (ref 0.0–0.2)

## 2020-07-13 LAB — COMPREHENSIVE METABOLIC PANEL
ALT: 24 U/L (ref 0–44)
AST: 42 U/L — ABNORMAL HIGH (ref 15–41)
Albumin: 3.1 g/dL — ABNORMAL LOW (ref 3.5–5.0)
Alkaline Phosphatase: 71 U/L (ref 38–126)
Anion gap: 9 (ref 5–15)
BUN: 20 mg/dL (ref 8–23)
CO2: 29 mmol/L (ref 22–32)
Calcium: 8.6 mg/dL — ABNORMAL LOW (ref 8.9–10.3)
Chloride: 107 mmol/L (ref 98–111)
Creatinine, Ser: 1.12 mg/dL — ABNORMAL HIGH (ref 0.44–1.00)
GFR, Estimated: 49 mL/min — ABNORMAL LOW (ref >60)
Glucose, Bld: 92 mg/dL (ref 70–99)
Potassium: 3.5 mmol/L (ref 3.5–5.1)
Sodium: 145 mmol/L (ref 135–145)
Total Bilirubin: 0.9 mg/dL (ref 0.3–1.2)
Total Protein: 5.9 g/dL — ABNORMAL LOW (ref 6.5–8.1)

## 2020-07-13 LAB — TROPONIN I (HIGH SENSITIVITY): Troponin I (High Sensitivity): 35 ng/L — ABNORMAL HIGH (ref <18)

## 2020-07-13 LAB — ECHOCARDIOGRAM LIMITED
S' Lateral: 4 cm
Weight: 2525.59 oz

## 2020-07-13 LAB — MAGNESIUM: Magnesium: 2.1 mg/dL (ref 1.7–2.4)

## 2020-07-13 MED ORDER — POTASSIUM CHLORIDE CRYS ER 20 MEQ PO TBCR
40.0000 meq | EXTENDED_RELEASE_TABLET | Freq: Once | ORAL | Status: AC
  Administered 2020-07-13: 40 meq via ORAL
  Filled 2020-07-13: qty 2

## 2020-07-13 MED ORDER — LACTATED RINGERS IV SOLN: INTRAVENOUS | Status: AC

## 2020-07-13 MED ORDER — METHOCARBAMOL 500 MG PO TABS
500.0000 mg | ORAL_TABLET | Freq: Three times a day (TID) | ORAL | Status: DC | PRN
  Administered 2020-07-13: 500 mg via ORAL
  Filled 2020-07-13: qty 1

## 2020-07-13 NOTE — Evaluation (Signed)
Physical Therapy Evaluation Patient Details Name: Wanda Mckenzie MRN: [id number] DOB: 1935/09/06 Today's Date: 07/13/2020   History of Present Illness  Pt adm with 2 episodes of syncope witnessed by the family. PMH - ckd, chf, vascular dementia, afib, HTN, copd, cva, lt hip hemiarthroplasty.  Clinical Impression  Pt admitted with above diagnosis and presents to PT with functional limitations due to deficits listed below (See PT problem list). Pt needs skilled PT to maximize independence and safety to allow discharge to home if family can continue to provide 24 hour assist. Very difficult for pt to mobilize in this unfamiliar setting due to severe dementia.      Follow Up Recommendations Home health PT;Supervision/Assistance - 24 hour    Equipment Recommendations  Wheelchair (measurements PT) (if needed unsure of home equipment)    Recommendations for Other Services       Precautions / Restrictions Precautions Precautions: Fall      Mobility  Bed Mobility Overal bed mobility: Needs Assistance Bed Mobility: Supine to Sit;Sit to Supine     Supine to sit: Total assist Sit to supine: Max assist   General bed mobility comments: Due to cognition pt repeatedly putting legs back up onto bed when I was trying to assist her to sit EOB making it very difficult    Transfers Overall transfer level: Needs assistance Equipment used: Rolling walker (2 wheeled);None Transfers: Sit to/from Stand Sit to Stand: Max assist         General transfer comment: Assist to bring hips up and for balance. With poor cognition pt unable to process and initiate movement.  Ambulation/Gait             General Gait Details: Unable to attempt due to pt incontinent of urine and stool.  Stairs            Wheelchair Mobility    Modified Rankin (Stroke Patients Only)       Balance Overall balance assessment: Needs assistance Sitting-balance support: Bilateral upper extremity  supported;Feet supported Sitting balance-Leahy Scale: Poor Sitting balance - Comments: UE support   Standing balance support: Single extremity supported Standing balance-Leahy Scale: Poor Standing balance comment: Stood x 5 sec with mod assist. Had to sit due to incontinent urine and stool                             Pertinent Vitals/Pain Pain Assessment: Faces Faces Pain Scale: No hurt    Home Living Family/patient expects to be discharged to:: Private residence Living Arrangements: Spouse/significant other               Additional Comments: Pt unable to relate    Prior Function           Comments: unsure of functional status. ED note states she had syncope while amb so it appears she is at least somewhat ambulatory     Hand Dominance   Dominant Hand: Right    Extremity/Trunk Assessment   Upper Extremity Assessment Upper Extremity Assessment: Defer to OT evaluation    Lower Extremity Assessment Lower Extremity Assessment: Difficult to assess due to impaired cognition;Generalized weakness       Communication   Communication: No difficulties  Cognition Arousal/Alertness: Awake/alert Behavior During Therapy: Restless Overall Cognitive Status: History of cognitive impairments - at baseline  General Comments: Severe dementia - assume she is at baseline      General Comments      Exercises     Assessment/Plan    PT Assessment Patient needs continued PT services  PT Problem List Decreased strength;Decreased activity tolerance;Decreased balance;Decreased mobility;Decreased cognition       PT Treatment Interventions DME instruction;Gait training;Functional mobility training;Therapeutic activities;Therapeutic exercise;Balance training;Patient/family education    PT Goals (Current goals can be found in the Care Plan section)  Acute Rehab PT Goals PT Goal Formulation: Patient unable to  participate in goal setting Time For Goal Achievement: 07/27/20 Potential to Achieve Goals: Fair    Frequency Min 3X/week   Barriers to discharge        Co-evaluation               AM-PAC PT "6 Clicks" Mobility  Outcome Measure Help needed turning from your back to your side while in a flat bed without using bedrails?: A Lot Help needed moving from lying on your back to sitting on the side of a flat bed without using bedrails?: A Lot Help needed moving to and from a bed to a chair (including a wheelchair)?: A Lot Help needed standing up from a chair using your arms (e.g., wheelchair or bedside chair)?: A Lot Help needed to walk in hospital room?: Total Help needed climbing 3-5 steps with a railing? : Total 6 Click Score: 10    End of Session   Activity Tolerance: Patient tolerated treatment well Patient left: in bed;with call bell/phone within reach;with bed alarm set Nurse Communication: Mobility status;Other (comment) (Pt incontinent and needed to be cleaned up) PT Visit Diagnosis: Other abnormalities of gait and mobility (R26.89)    Time: 0347-4259 PT Time Calculation (min) (ACUTE ONLY): 27 min   Charges:   PT Evaluation $PT Eval Moderate Complexity: 1 Mod PT Treatments $Therapeutic Activity: 8-22 mins        [facility] PT Acute Rehabilitation Services Pager [phone] Office [phone]   Angelina Ok [facility] 07/13/2020, 5:30 PM

## 2020-07-13 NOTE — Procedures (Signed)
Patient Name: LARIN DEPAOLI  MRN: [id number]  Epilepsy Attending: Charlsie Quest  Referring Physician/Provider: Dr. Shauna Hugh Date: 07/13/2018 Duration: 25.47 mins  Patient history: 85 year old female with 2 episodes of witnessed loss of consciousness.  EEG to evaluate for seizures.  Level of alertness: Awake  AEDs during EEG study: None  Technical aspects: This EEG study was done with scalp electrodes positioned according to the 10-20 International system of electrode placement. Electrical activity was acquired at a sampling rate of 500Hz  and reviewed with a high frequency filter of 70Hz  and a low frequency filter of 1Hz . EEG data were recorded continuously and digitally stored.   Description: The posterior dominant rhythm consists of 7.5 Hz activity of moderate voltage (25-35 uV) seen predominantly in posterior head regions, symmetric and reactive to eye opening and eye closing. EEG showed continuous generalized 5 to 7 Hz theta as well as intermittent generalized 2 to 3 Hz delta slowing. Hyperventilation and photic stimulation were not performed.     ABNORMALITY -Continuous slow, generalized  IMPRESSION: This study is suggestive of mild to moderate diffuse encephalopathy, nonspecific etiology. No seizures or epileptiform discharges were seen throughout the recording.  Ezekiel Menzer 

## 2020-07-13 NOTE — Progress Notes (Signed)
EEG complete - results pending 

## 2020-07-13 NOTE — Progress Notes (Signed)
PROGRESS NOTE  Wanda Mckenzie ZGY:[id number] DOB: [birth year] DOA: 07/12/2020  PCP: [name], FNP  Brief History/Interval Summary: 85 year old with past medical history of chronic kidney disease stage IIIb, systolic congestive heart failure (Echo 04/2019 EF 40-45%), hyperlipidemia, vascular dementia, chronic atrial fibrillation, hypertension, COPD and previous CVA with 2 episodes of loss of consciousness witnessed by family.  Patient was hospitalized for similar issues in 2020.  History was limited as patient is a poor historian due to dementia.  Reason for Visit: Syncope  Consultants: None yet  Procedures:  Echocardiogram EEG  Antibiotics: Anti-infectives (From admission, onward)   None      Subjective/Interval History: Patient pleasantly confused.  Unable to provide any history.  Noted to be looking to her right side.  Appears to have pain in her neck.  Unable to turn her neck.    Assessment/Plan:  Syncope Etiology unclear.  Seizure activity is in the differential.  EEG is pending.  Echocardiogram has been ordered.  Check orthostatic vital signs.  Patient was thought to be dehydrated and so was given IV fluids..  UA does not suggest infection.  TSH only mildly abnormal.  PT and OT evaluation.  Neck pain No focal neurological deficits noted.  Likely has torticollis.  CT head did not show any acute findings.  We will also do CT of the cervical spine.  COVID-19 infection Incidentally found positive for COVID-19.  Does not appear to have any respiratory symptoms.  Chest x-ray did not show any active disease.  Continue to monitor for now.  Exposure information is unavailable.  Will discuss with family.  Unclear if she is a candidate for 3-day course of Remdesivir. Per son they are not aware of any known exposure.  Since we do not have any idea as to when patient may have become positive we will hold off on Remdesivir for now.  Hypertensive urgency Noted to have elevated  blood pressures.  Home medication regimen has been resumed including hydralazine and Cozaar.  Also noted to be on metoprolol.  Continue to monitor.  Mildly elevated troponin No mention of chest pain.  No ischemic changes noted on EKG.  Follow-up on echocardiogram.  Chronic atrial fibrillation Rate appears to be controlled.  Continue metoprolol.  Patient noted to be anticoagulated with apixaban which is being continued.  Hypothyroidism Continue levothyroxine.  TSH noted to be 4.7.  Recommend rechecking in a few weeks.  COPD Stable.  No evidence of exacerbation.  Vascular dementia Longstanding history of advanced vascular dementia.  Chronic systolic CHF No evidence for volume overload currently.  Monitor ins and outs and daily weights.  Echo from 2020 showed EF of 40 to 45%.  Global hypokinesis was noted then.  Hyperlipidemia Continue statin.   DVT Prophylaxis:  Currently on apixaban Code Status: Changed to DNR.  Discussed with patient's son Family Communication: Discussed with patient's son. Disposition Plan: Hopefully return home when improved  Status is: Observation  The patient will require care spanning > 2 midnights and should be moved to inpatient because: Ongoing active pain requiring inpatient pain management, Altered mental status and Inpatient level of care appropriate due to severity of illness  Dispo: The patient is from: Home              Anticipated d/c is to: Home              Anticipated d/c date is: 1 day  Patient currently is not medically stable to d/c.     Medications:  Scheduled: . apixaban  5 mg Oral BID  . atorvastatin  20 mg Oral QHS  . memantine  28 mg Oral QHS   And  . donepezil  10 mg Oral QHS  . hydrALAZINE  100 mg Oral Q8H  . levothyroxine  12.5 mcg Oral Q0600  . losartan  100 mg Oral Daily  . metoprolol succinate  50 mg Oral Daily   Continuous:  NGE:[id number] **OR** acetaminophen, albuterol, labetalol, ondansetron  **OR** ondansetron (ZOFRAN) IV, polyethylene glycol   Objective:  Vital Signs  Vitals:   07/12/20 1825 07/12/20 1957 07/13/20 0028 07/13/20 0833  BP: (!) 185/81 (!) 187/85 (!) 189/91 (!) 184/88  Pulse: 77 66  79  Resp: 17 12    Temp: 98.8 F (37.1 C) 98.3 F (36.8 C)    TempSrc: Rectal Oral    SpO2: 96% 97%    Weight: 71.6 kg      No intake or output data in the 24 hours ending 07/13/20 1107 Filed Weights   07/12/20 1825  Weight: 71.6 kg    General appearance: Awake alert.  In no distress.  Distracted Unable to turn her neck to the left.  No swelling noted over the neck area but movement is painful. Resp: Clear to auscultation bilaterally.  Normal effort Cardio: S1-S2 is normal regular.  No S3-S4.  No rubs murmurs or bruit GI: Abdomen is soft.  Nontender nondistended.  Bowel sounds are present normal.  No masses organomegaly Extremities: No edema.  Full range of motion of lower extremities. Neurologic: Disoriented.  No focal neurological deficits.     Lab Results:  Data Reviewed: I have personally reviewed following labs and imaging studies  CBC: Recent Labs  Lab 07/10/20 1122 07/12/20 1124 07/13/20 0022  WBC 4.9 5.2 4.1  NEUTROABS  --   --  1.3*  HGB 14.0 13.8 13.7  HCT 41.5 41.0 39.7  MCV 95.8 97.4 94.7  PLT 234 199 182    Basic Metabolic Panel: Recent Labs  Lab 07/10/20 1122 07/12/20 1124 07/13/20 0022  NA 142 138 145  K 3.7 3.5 3.5  CL 105 105 107  CO2 26 21* 29  GLUCOSE 105* 165* 92  BUN 11 11 20   CREATININE 1.26* 0.92 1.12*  CALCIUM 9.1 7.5* 8.6*  MG  --   --  2.1    GFR: Estimated Creatinine Clearance: 35.9 mL/min (A) (by C-G formula based on SCr of 1.12 mg/dL (H)).  Liver Function Tests: Recent Labs  Lab 07/13/20 0022  AST 42*  ALT 24  ALKPHOS 71  BILITOT 0.9  PROT 5.9*  ALBUMIN 3.1*     Thyroid Function Tests: Recent Labs    07/12/20 1157  TSH 4.750*      Recent Results (from the past 240 hour(s))  SARS  CORONAVIRUS 2 (TAT 6-24 HRS) Nasopharyngeal Nasopharyngeal Swab     Status: Abnormal   Collection Time: 07/12/20  4:19 PM   Specimen: Nasopharyngeal Swab  Result Value Ref Range Status   SARS Coronavirus 2 POSITIVE (A) NEGATIVE Final    Comment: (NOTE) SARS-CoV-2 target nucleic acids are DETECTED.  The SARS-CoV-2 RNA is generally detectable in upper and lower respiratory specimens during the acute phase of infection. Positive results are indicative of the presence of SARS-CoV-2 RNA. Clinical correlation with patient history and other diagnostic information is  necessary to determine patient infection status. Positive results do not rule out bacterial infection  or co-infection with other viruses.  The expected result is Negative.  Fact Sheet for Patients: HairSlick.no  Fact Sheet for Healthcare Providers: quierodirigir.com  This test is not yet approved or cleared by the [place] FDA and  has been authorized for detection and/or diagnosis of SARS-CoV-2 by FDA under an Emergency Use Authorization (EUA). This EUA will remain  in effect (meaning this test can be used) for the duration of the COVID-19 declaration under Section 564(b)(1) of the Act, 21 U. S.C. section 360bbb-3(b)(1), unless the authorization is terminated or revoked sooner.   Performed at [facility], [address] [place], [place] [address]       Radiology Studies: DG Chest 1 View  Result Date: 07/12/2020 CLINICAL DATA:  Syncope EXAM: CHEST  1 VIEW COMPARISON:  05/14/2019 FINDINGS: Cardiomegaly. No focal opacity or pleural effusion. Aortic atherosclerosis. No pneumothorax. IMPRESSION: No active disease. Cardiomegaly. Electronically Signed   By: Jasmine Pang M.D.   On: 07/12/2020 16:19   CT Head Wo Contrast  Result Date: 07/12/2020 CLINICAL DATA:  Recurrent syncopal episodes. EXAM: CT HEAD WITHOUT CONTRAST TECHNIQUE: Contiguous axial images were  obtained from the base of the skull through the vertex without intravenous contrast. COMPARISON:  Head CT 07/10/2020 FINDINGS: Brain: Stable age related cerebral atrophy, ventriculomegaly and periventricular white matter disease. Remote right temporal/parietal infarct with encephalomalacia. Remote right occipital infarct. No extra-axial fluid collections are identified. No CT findings for acute hemispheric infarction or intracranial hemorrhage. No mass lesions. The brainstem and cerebellum are normal. Vascular: Stable vascular calcifications.  No hyperdense vessels. Skull: No acute skull fracture or worrisome bone lesions. Stable hyperostosis frontalis interna. Sinuses/Orbits: The paranasal sinuses and mastoid air cells are clear. The globes are intact. Other: No scalp lesions or scalp hematoma. IMPRESSION: 1. Stable age related cerebral atrophy, ventriculomegaly and periventricular white matter disease. 2. Remote right temporal/parietal and right occipital infarcts. 3. No acute intracranial findings or mass lesions. Electronically Signed   By: Rudie Meyer M.D.   On: 07/12/2020 13:41   DG Abdomen Acute W/Chest  Result Date: 07/12/2020 CLINICAL DATA:  Abdominal pain EXAM: DG ABDOMEN ACUTE WITH 1 VIEW CHEST COMPARISON:  None. FINDINGS: There are nondilated air-filled loops of small bowel in the right abdomen and pelvis. No free air below the diaphragms. Lungs are clear. Partially imaged left hip arthroplasty. Lumbar spine degenerative changes. IMPRESSION: Nonobstructive bowel gas pattern. Electronically Signed   By: Guadlupe Spanish M.D.   On: 07/12/2020 12:17       LOS: 0 days   Ibrahem Volkman Rito Ehrlich  [facility] Pager on [email or web address]  07/13/2020, 11:07 AM

## 2020-07-13 NOTE — Progress Notes (Signed)
  Echocardiogram 2D Echocardiogram has been performed.  Delcie Roch 07/13/2020, 10:49 AM

## 2020-07-13 NOTE — Progress Notes (Signed)
Spoke with son Leonette Most and was updated that her mother ( pt ) was Covid 19 + and that she was transferred to rm 6E 25. Son has also indicated that her mother passed out x 3  Tuesday,Wednesday and Thursday. Son wants MD  to be called and updated.

## 2020-07-14 DIAGNOSIS — I472 Ventricular tachycardia: Secondary | ICD-10-CM

## 2020-07-14 LAB — CBC
HCT: 41.1 % (ref 36.0–46.0)
Hemoglobin: 13.3 g/dL (ref 12.0–15.0)
MCH: 31.3 pg (ref 26.0–34.0)
MCHC: 32.4 g/dL (ref 30.0–36.0)
MCV: 96.7 fL (ref 80.0–100.0)
Platelets: 186 10*3/uL (ref 150–400)
RBC: 4.25 MIL/uL (ref 3.87–5.11)
RDW: 13.2 % (ref 11.5–15.5)
WBC: 5.5 10*3/uL (ref 4.0–10.5)
nRBC: 0 % (ref 0.0–0.2)

## 2020-07-14 LAB — BASIC METABOLIC PANEL
Anion gap: 9 (ref 5–15)
BUN: 16 mg/dL (ref 8–23)
CO2: 27 mmol/L (ref 22–32)
Calcium: 8.6 mg/dL — ABNORMAL LOW (ref 8.9–10.3)
Chloride: 106 mmol/L (ref 98–111)
Creatinine, Ser: 0.98 mg/dL (ref 0.44–1.00)
GFR, Estimated: 58 mL/min — ABNORMAL LOW (ref >60)
Glucose, Bld: 121 mg/dL — ABNORMAL HIGH (ref 70–99)
Potassium: 3.5 mmol/L (ref 3.5–5.1)
Sodium: 142 mmol/L (ref 135–145)

## 2020-07-14 LAB — MAGNESIUM: Magnesium: 1.8 mg/dL (ref 1.7–2.4)

## 2020-07-14 MED ORDER — AMIODARONE HCL 200 MG PO TABS
200.0000 mg | ORAL_TABLET | Freq: Every day | ORAL | Status: DC
  Administered 2020-07-14 – 2020-07-16 (×3): 200 mg via ORAL
  Filled 2020-07-14 (×4): qty 1

## 2020-07-14 MED ORDER — AMLODIPINE BESYLATE 5 MG PO TABS
5.0000 mg | ORAL_TABLET | Freq: Every day | ORAL | Status: DC
  Administered 2020-07-14 – 2020-07-15 (×2): 5 mg via ORAL
  Filled 2020-07-14 (×2): qty 1

## 2020-07-14 MED ORDER — HYDRALAZINE HCL 20 MG/ML IJ SOLN
10.0000 mg | Freq: Four times a day (QID) | INTRAMUSCULAR | Status: DC | PRN
  Administered 2020-07-14 – 2020-07-15 (×3): 10 mg via INTRAVENOUS
  Filled 2020-07-14 (×3): qty 1

## 2020-07-14 MED ORDER — POTASSIUM CHLORIDE CRYS ER 20 MEQ PO TBCR
40.0000 meq | EXTENDED_RELEASE_TABLET | Freq: Once | ORAL | Status: AC
  Administered 2020-07-14: 40 meq via ORAL
  Filled 2020-07-14: qty 2

## 2020-07-14 MED ORDER — MAGNESIUM SULFATE 2 GM/50ML IV SOLN
2.0000 g | Freq: Once | INTRAVENOUS | Status: AC
  Administered 2020-07-14: 2 g via INTRAVENOUS
  Filled 2020-07-14: qty 50

## 2020-07-14 NOTE — Progress Notes (Signed)
MD notified of repeated episodes of vtach up to 25 beats. Orders received for STAT ekg amiodarone, & mag / K+ replaced, Amiodarone held due to prolonged QT/Qtc on ekg. Pending repeat ekg to reassess qt. Pt has been asymptomatic. Will continue to monitor.

## 2020-07-14 NOTE — Plan of Care (Signed)

## 2020-07-14 NOTE — Progress Notes (Signed)
Tele called with non sustained V tach. Reported to oncoming nurse during report.

## 2020-07-14 NOTE — Progress Notes (Signed)
PROGRESS NOTE  Wanda Mckenzie ZOX:[id number] DOB: Jun 24, 1935 DOA: 07/12/2020  PCP: Gwenlyn FudgeJoyce, Britney F, FNP  Brief History/Interval Summary: 85 year old with past medical history of chronic kidney disease stage IIIb, systolic congestive heart failure (Echo 04/2019 EF 40-45%), hyperlipidemia, vascular dementia, chronic atrial fibrillation, hypertension, COPD and previous CVA with 2 episodes of loss of consciousness witnessed by family.  Patient was hospitalized for similar issues in 2020.  History was limited as patient is a poor historian due to dementia.  Reason for Visit: Syncope  Consultants: None yet  Procedures:  Echocardiogram IMPRESSIONS  1. Left ventricular ejection fraction, by estimation, is 35 to 40%. The left ventricle has moderately decreased function. The left ventricle demonstrates global hypokinesis. Left ventricular diastolic parameters are indeterminate.  2. Right ventricular systolic function is normal. The right ventricular size is normal. There is normal pulmonary artery systolic pressure.  3. The mitral valve is normal in structure. Mild mitral valve regurgitation. No evidence of mitral stenosis.  4. The aortic valve is tricuspid. There is mild calcification of the aortic valve. There is mild thickening of the aortic valve. Aortic valve regurgitation is moderate. No aortic stenosis is present.  EEG This study is suggestive of mild to moderate diffuse encephalopathy, nonspecific etiology. No seizures or epileptiform discharges were seen throughout the recording.  Antibiotics: Anti-infectives (From admission, onward)   None      Subjective/Interval History: Patient noted to be lethargic this morning.  Does not answer many questions.  Continues to have pain in her left side of the neck.     Assessment/Plan:  Syncope likely due to orthostatic hypotension Based on history it appears that she may have had orthostatic hypotension.  Family reported that she passed out  whenever she went from a lying or sitting position to standing position.  Patient was hydrated.  Orthostatic vital signs checked yesterday but only from lying to sitting with no drop in blood pressure or increase in heart rate.   EEG did not show any epileptiform activity.   Echocardiogram shows slight worsening in her systolic function with EF down to 35 to 40% from 40 to 45% previously.  Global hypokinesis was noted.  No significant valvular abnormalities noted. Patient seen by PT and OT.  Home health was recommended.  Chronic systolic CHF with multiple episodes of nonsustained VT Telemetry reviewed.  Multiple episodes of NSVT noted.  Patient's potassium noted to be 3.5.  Magnesium 1.8.  This will be repleted.  Patient's resting heart rate is in the 40s and 50s.  Cannot give beta-blocker at this time.  Blood pressure stable.  Patient does not appear to be very symptomatic from these episodes of NSVT.  Discussed with cardiology.  No good options available.  Amiodarone was recommended however patient's QT intervals noted to be greater than 600.  Likely due to electrolyte abnormalities.  Once they have corrected we will recheck QTC later today. And if better may be able to initiate amiodarone. Continue to monitor for now.    Neck pain No focal neurological deficits noted.  Likely has torticollis.  CT head did not show any acute findings.  CT of the cervical spine did not show any acute findings either.  COVID-19 infection Incidentally found positive for COVID-19.  Does not appear to have any respiratory symptoms.  Chest x-ray did not show any active disease.  Continue to monitor for now.  Per son they are not aware of any known exposure.  Not a candidate for Remdesivir at this  time.  Hypertensive urgency Noted to have elevated blood pressures.  Home medication regimen has been resumed including hydralazine and Cozaar.  Hydralazine as needed.  Holding metoprolol due to bradycardia.  Mildly elevated  troponin No ischemic changes noted on EKG.  Mild elevation in troponin likely due to demand ischemia.  Global hypokinesis noted on echocardiogram.  Considering her advanced dementia patient not a candidate for aggressive interventions or work-up.  Chronic atrial fibrillation Patient noted to have slow ventricular response on telemetry.  Heart rate occasionally in the 40s.  Mostly in the 60s.  Blood pressure is hypertensive.  Holding her metoprolol for now.  We will also discontinue her Aricept which can also cause bradycardia.  Continue apixaban.    Hypothyroidism Continue levothyroxine.  TSH noted to be 4.7.  Recommend rechecking in a few weeks.  COPD Stable.  No evidence of exacerbation.  Vascular dementia Longstanding history of advanced vascular dementia.  Hyperlipidemia Continue statin.   DVT Prophylaxis:  Currently on apixaban Code Status: DNR Family Communication: Discussed with son. Disposition Plan: Hopefully return home when improved  Status is: Inpatient  Remains inpatient appropriate because:IV treatments appropriate due to intensity of illness or inability to take PO and Inpatient level of care appropriate due to severity of illness   Dispo:  Patient From: Home  Planned Disposition: Home  Expected discharge date: 07/14/2020  Medically stable for discharge: No           Medications:  Scheduled: . amiodarone  200 mg Oral Daily  . amLODipine  5 mg Oral Daily  . apixaban  5 mg Oral BID  . atorvastatin  20 mg Oral QHS  . hydrALAZINE  100 mg Oral Q8H  . levothyroxine  12.5 mcg Oral Q0600  . losartan  100 mg Oral Daily  . memantine  28 mg Oral QHS   Continuous: . lactated ringers 50 mL/hr at 07/14/20 0354   LMB:[id number] **OR** acetaminophen, albuterol, hydrALAZINE, methocarbamol, ondansetron **OR** ondansetron (ZOFRAN) IV, polyethylene glycol   Objective:  Vital Signs  Vitals:   07/14/20 0532 07/14/20 0549 07/14/20 1007 07/14/20 1008  BP: (!)  158/52 (!) 195/62 (!) 160/64   Pulse:    (!) 52  Resp:      Temp:      TempSrc:      SpO2:      Weight:        Intake/Output Summary (Last 24 hours) at 07/14/2020 1205 Last data filed at 07/14/2020 0511 Gross per 24 hour  Intake 990 ml  Output 775 ml  Net 215 ml   Filed Weights   07/12/20 1825 07/14/20 0434  Weight: 71.6 kg 73.8 kg    General appearance: Lethargic today.  Arousable.  Remains confused. Limited range of motion of her neck. Resp: Clear to auscultation bilaterally.  Normal effort Cardio: S1-S2 is irregularly irregular.  Bradycardic.   GI: Abdomen is soft.  Nontender nondistended.  Bowel sounds are present normal.  No masses organomegaly Extremities: No edema.   Neurologic: Distracted and disoriented.  No focal neurological deficits.      Lab Results:  Data Reviewed: I have personally reviewed following labs and imaging studies  CBC: Recent Labs  Lab 07/10/20 1122 07/12/20 1124 07/13/20 0022 07/14/20 0246  WBC 4.9 5.2 4.1 5.5  NEUTROABS  --   --  1.3*  --   HGB 14.0 13.8 13.7 13.3  HCT 41.5 41.0 39.7 41.1  MCV 95.8 97.4 94.7 96.7  PLT 234 199 182 186  Basic Metabolic Panel: Recent Labs  Lab 07/10/20 1122 07/12/20 1124 07/13/20 0022 07/14/20 0246  NA 142 138 145 142  K 3.7 3.5 3.5 3.5  CL 105 105 107 106  CO2 26 21* 29 27  GLUCOSE 105* 165* 92 121*  BUN 11 11 20 16   CREATININE 1.26* 0.92 1.12* 0.98  CALCIUM 9.1 7.5* 8.6* 8.6*  MG  --   --  2.1 1.8    GFR: Estimated Creatinine Clearance: 41.6 mL/min (by C-G formula based on SCr of 0.98 mg/dL).  Liver Function Tests: Recent Labs  Lab 07/13/20 0022  AST 42*  ALT 24  ALKPHOS 71  BILITOT 0.9  PROT 5.9*  ALBUMIN 3.1*     Thyroid Function Tests: Recent Labs    07/12/20 1157  TSH 4.750*      Recent Results (from the past 240 hour(s))  SARS CORONAVIRUS 2 (TAT 6-24 HRS) Nasopharyngeal Nasopharyngeal Swab     Status: Abnormal   Collection Time: 07/12/20  4:19 PM    Specimen: Nasopharyngeal Swab  Result Value Ref Range Status   SARS Coronavirus 2 POSITIVE (A) NEGATIVE Final    Comment: (NOTE) SARS-CoV-2 target nucleic acids are DETECTED.  The SARS-CoV-2 RNA is generally detectable in upper and lower respiratory specimens during the acute phase of infection. Positive results are indicative of the presence of SARS-CoV-2 RNA. Clinical correlation with patient history and other diagnostic information is  necessary to determine patient infection status. Positive results do not rule out bacterial infection or co-infection with other viruses.  The expected result is Negative.  Fact Sheet for Patients: 07/14/20  Fact Sheet for Healthcare Providers: HairSlick.no  This test is not yet approved or cleared by the quierodir[employer]ir.com FDA and  has been authorized for detection and/or diagnosis of SARS-CoV-2 by FDA under an Emergency Use Authorization (EUA). This EUA will remain  in effect (meaning this test can be used) for the duration of the COVID-19 declaration under Section 564(b)(1) of the Act, 21 U. S.C. section 360bbb-3(b)(1), unless the authorization is terminated or revoked sooner.   Performed at [facility], [address], [place], [place]       Radiology Studies: EEG  Result Date: 07/13/2020 07/15/2020, MD     07/13/2020 12:19 PM Patient Name: ELDENE PLOCHER MRN: Wanda Ceo Epilepsy Attending: [id number] Referring Physician/Provider: Dr. Charlsie Quest Date: 07/13/2018 Duration: 25.47 mins Patient history: 85 year old female with 2 episodes of witnessed loss of consciousness.  EEG to evaluate for seizures. Level of alertness: Awake AEDs during EEG study: None Technical aspects: This EEG study was done with scalp electrodes positioned according to the 10-20 International system of electrode placement. Electrical activity was acquired at a sampling rate of  500Hz  and reviewed with a high frequency filter of 70Hz  and a low frequency filter of 1Hz . EEG data were recorded continuously and digitally stored. Description: The posterior dominant rhythm consists of 7.5 Hz activity of moderate voltage (25-35 uV) seen predominantly in posterior head regions, symmetric and reactive to eye opening and eye closing. EEG showed continuous generalized 5 to 7 Hz theta as well as intermittent generalized 2 to 3 Hz delta slowing. Hyperventilation and photic stimulation were not performed.   ABNORMALITY -Continuous slow, generalized IMPRESSION: This study is suggestive of mild to moderate diffuse encephalopathy, nonspecific etiology. No seizures or epileptiform discharges were seen throughout the recording. 97   DG Chest 1 View  Result Date: 07/12/2020 CLINICAL DATA:  Syncope  EXAM: CHEST  1 VIEW COMPARISON:  05/14/2019 FINDINGS: Cardiomegaly. No focal opacity or pleural effusion. Aortic atherosclerosis. No pneumothorax. IMPRESSION: No active disease. Cardiomegaly. Electronically Signed   By: Jasmine Pang M.D.   On: 07/12/2020 16:19   CT Head Wo Contrast  Result Date: 07/12/2020 CLINICAL DATA:  Recurrent syncopal episodes. EXAM: CT HEAD WITHOUT CONTRAST TECHNIQUE: Contiguous axial images were obtained from the base of the skull through the vertex without intravenous contrast. COMPARISON:  Head CT 07/10/2020 FINDINGS: Brain: Stable age related cerebral atrophy, ventriculomegaly and periventricular white matter disease. Remote right temporal/parietal infarct with encephalomalacia. Remote right occipital infarct. No extra-axial fluid collections are identified. No CT findings for acute hemispheric infarction or intracranial hemorrhage. No mass lesions. The brainstem and cerebellum are normal. Vascular: Stable vascular calcifications.  No hyperdense vessels. Skull: No acute skull fracture or worrisome bone lesions. Stable hyperostosis frontalis interna. Sinuses/Orbits:  The paranasal sinuses and mastoid air cells are clear. The globes are intact. Other: No scalp lesions or scalp hematoma. IMPRESSION: 1. Stable age related cerebral atrophy, ventriculomegaly and periventricular white matter disease. 2. Remote right temporal/parietal and right occipital infarcts. 3. No acute intracranial findings or mass lesions. Electronically Signed   By: Rudie Meyer M.D.   On: 07/12/2020 13:41   CT CERVICAL SPINE WO CONTRAST  Result Date: 07/13/2020 CLINICAL DATA:  Neck pain, acute, syncope EXAM: CT CERVICAL SPINE WITHOUT CONTRAST TECHNIQUE: Multidetector CT imaging of the cervical spine was performed without intravenous contrast. Multiplanar CT image reconstructions were also generated. COMPARISON:  None FINDINGS: Alignment: Focal kyphosis and slight anterolisthesis at C5-C6, with question C5-C6 fusion. Remaining alignments normal Skull base and vertebrae: Mild osseous demineralization. Skull base intact. Vertebral body heights maintained. Multilevel disc space narrowing and endplate spur formation. Scattered facet degenerative changes. Facet fusion at C5-C6. No acute fracture or bone destruction. Soft tissues and spinal canal: Prevertebral soft tissues normal thickness. No regional soft tissue abnormalities. Scattered atherosclerotic calcifications of internal carotid, common carotid, and vertebral arteries as well as proximal great vessels. Disc levels:  No specific abnormalities Upper chest: Lung apices clear Other: N/A IMPRESSION: Multilevel degenerative disc and facet disease changes of the cervical spine as above. Focal kyphosis and slight anterolisthesis at C5-C6, with with suspect fusion of vertebral bodies and facet joints bilaterally. No acute cervical spine abnormalities. Electronically Signed   By: Ulyses Southward M.D.   On: 07/13/2020 13:01   ECHOCARDIOGRAM LIMITED  Result Date: 07/13/2020    ECHOCARDIOGRAM LIMITED REPORT   Patient Name:   TALLI KIMMER Date of Exam: 07/13/2020  Medical Rec #:  [id number]       Height:       62.0 in Accession #:    [id number]      Weight:       157.8 lb Date of Birth:  10-23-1935      BSA:          1.729 m Patient Age:    84 years        BP:           184/88 mmHg Patient Gender: F               HR:           63 bpm. Exam Location:  Inpatient Procedure: Limited Echo, Limited Color Doppler and Cardiac Doppler Indications:    syncope 780.2  History:        Patient has prior history of Echocardiogram examinations, most  recent 05/14/2019. Cardiomyopathy and CHF, COPD and COVID.                 chronic kidney disease, Arrythmias:Atrial Fibrillation,                 Signs/Symptoms:Syncope; Risk Factors:Hypertension and                 Dyslipidemia.  Sonographer:    Delcie Roch Referring Phys: [id number] Deno Lunger [place] IMPRESSIONS  1. Left ventricular ejection fraction, by estimation, is 35 to 40%. The left ventricle has moderately decreased function. The left ventricle demonstrates global hypokinesis. Left ventricular diastolic parameters are indeterminate.  2. Right ventricular systolic function is normal. The right ventricular size is normal. There is normal pulmonary artery systolic pressure.  3. The mitral valve is normal in structure. Mild mitral valve regurgitation. No evidence of mitral stenosis.  4. The aortic valve is tricuspid. There is mild calcification of the aortic valve. There is mild thickening of the aortic valve. Aortic valve regurgitation is moderate. No aortic stenosis is present. FINDINGS  Left Ventricle: Left ventricular ejection fraction, by estimation, is 35 to 40%. The left ventricle has moderately decreased function. The left ventricle demonstrates global hypokinesis. Left ventricular diastolic parameters are indeterminate. Right Ventricle: The right ventricular size is normal. No increase in right ventricular wall thickness. Right ventricular systolic function is normal. There is normal pulmonary artery systolic  pressure. The tricuspid regurgitant velocity is 2.62 m/s, and  with an assumed right atrial pressure of 3 mmHg, the estimated right ventricular systolic pressure is 30.5 mmHg. Left Atrium: Left atrial size was normal in size. Right Atrium: Right atrial size was normal in size. Pericardium: Trivial pericardial effusion is present. Mitral Valve: The mitral valve is normal in structure. Mild mitral valve regurgitation. No evidence of mitral valve stenosis. Tricuspid Valve: Tricuspid valve regurgitation is mild . No evidence of tricuspid stenosis. Aortic Valve: The aortic valve is tricuspid. There is mild calcification of the aortic valve. There is mild thickening of the aortic valve. Aortic valve regurgitation is moderate. No aortic stenosis is present. Pulmonic Valve: The pulmonic valve was normal in structure. Pulmonic valve regurgitation is not visualized. No evidence of pulmonic stenosis. Aorta: The aortic root is normal in size and structure. LEFT VENTRICLE PLAX 2D LVIDd:         5.00 cm LVIDs:         4.00 cm LV PW:         1.00 cm LV IVS:        1.10 cm LVOT diam:     2.10 cm LVOT Area:     3.46 cm  LEFT ATRIUM         Index LA diam:    4.30 cm 2.49 cm/m   AORTA Ao Root diam: 2.80 cm Ao Asc diam:  2.80 cm TRICUSPID VALVE TR Peak grad:   27.5 mmHg TR Vmax:        262.00 cm/s  SHUNTS Systemic Diam: 2.10 cm Chilton Si MD Electronically signed by Chilton Si MD Signature Date/Time: 07/13/2020/2:22:28 PM    Final        LOS: 1 day   Osvaldo Shipper  [facility] Pager on [email or web address]  07/14/2020, 12:05 PM

## 2020-07-15 LAB — BASIC METABOLIC PANEL
Anion gap: 11 (ref 5–15)
BUN: 12 mg/dL (ref 8–23)
CO2: 21 mmol/L — ABNORMAL LOW (ref 22–32)
Calcium: 8.5 mg/dL — ABNORMAL LOW (ref 8.9–10.3)
Chloride: 109 mmol/L (ref 98–111)
Creatinine, Ser: 0.84 mg/dL (ref 0.44–1.00)
GFR, Estimated: >60 mL/min (ref >60)
Glucose, Bld: 100 mg/dL — ABNORMAL HIGH (ref 70–99)
Potassium: 3.8 mmol/L (ref 3.5–5.1)
Sodium: 141 mmol/L (ref 135–145)

## 2020-07-15 LAB — CBC
HCT: 40.6 % (ref 36.0–46.0)
Hemoglobin: 13.5 g/dL (ref 12.0–15.0)
MCH: 31.6 pg (ref 26.0–34.0)
MCHC: 33.3 g/dL (ref 30.0–36.0)
MCV: 95.1 fL (ref 80.0–100.0)
Platelets: 173 10*3/uL (ref 150–400)
RBC: 4.27 MIL/uL (ref 3.87–5.11)
RDW: 13.3 % (ref 11.5–15.5)
WBC: 6.9 10*3/uL (ref 4.0–10.5)
nRBC: 0 % (ref 0.0–0.2)

## 2020-07-15 LAB — MAGNESIUM: Magnesium: 2 mg/dL (ref 1.7–2.4)

## 2020-07-15 MED ORDER — POTASSIUM CHLORIDE CRYS ER 20 MEQ PO TBCR
40.0000 meq | EXTENDED_RELEASE_TABLET | Freq: Two times a day (BID) | ORAL | Status: AC
  Administered 2020-07-15 (×2): 40 meq via ORAL
  Filled 2020-07-15 (×2): qty 2

## 2020-07-15 MED ORDER — AMLODIPINE BESYLATE 10 MG PO TABS
10.0000 mg | ORAL_TABLET | Freq: Every day | ORAL | Status: DC
  Administered 2020-07-16: 10 mg via ORAL
  Filled 2020-07-15: qty 1

## 2020-07-15 NOTE — Progress Notes (Signed)
PROGRESS NOTE  Wanda Mckenzie GNF:[id number] DOB: [birth year] DOA: 07/12/2020  PCP: [name], FNP  Brief History/Interval Summary: 85 year old with past medical history of chronic kidney disease stage IIIb, systolic congestive heart failure (Echo 04/2019 EF 40-45%), hyperlipidemia, vascular dementia, chronic atrial fibrillation, hypertension, COPD and previous CVA with 2 episodes of loss of consciousness witnessed by family.  Patient was hospitalized for similar issues in 2020.  History was limited as patient is a poor historian due to dementia.  Reason for Visit: Syncope  Consultants: None yet  Procedures:  Echocardiogram IMPRESSIONS  1. Left ventricular ejection fraction, by estimation, is 35 to 40%. The left ventricle has moderately decreased function. The left ventricle demonstrates global hypokinesis. Left ventricular diastolic parameters are indeterminate.  2. Right ventricular systolic function is normal. The right ventricular size is normal. There is normal pulmonary artery systolic pressure.  3. The mitral valve is normal in structure. Mild mitral valve regurgitation. No evidence of mitral stenosis.  4. The aortic valve is tricuspid. There is mild calcification of the aortic valve. There is mild thickening of the aortic valve. Aortic valve regurgitation is moderate. No aortic stenosis is present.  EEG This study is suggestive of mild to moderate diffuse encephalopathy, nonspecific etiology. No seizures or epileptiform discharges were seen throughout the recording.  Antibiotics: Anti-infectives (From admission, onward)   None      Subjective/Interval History: Patient noted to be much more awake alert this morning. Pleasantly confused. Denies any pain. Able to move her neck better than the last couple of days.    Assessment/Plan:  Syncope likely due to orthostatic hypotension Based on history it appears that she may have had orthostatic hypotension.  Family reported  that she passed out whenever she went from a lying or sitting position to standing position.  Patient was hydrated.  Orthostatic vital signs checked yesterday but only from lying to sitting with no drop in blood pressure or increase in heart rate.   EEG did not show any epileptiform activity.   Echocardiogram shows slight worsening in her systolic function with EF down to 35 to 40% from 40 to 45% previously.  Global hypokinesis was noted.  No significant valvular abnormalities noted. Patient seen by PT and OT.  Home health was recommended. Will need to be reevaluated by PT and OT before discharge.  Chronic systolic CHF with multiple episodes of nonsustained VT Patient's EF is noted to be 35 to 40%. Worse from what it was previously. No evidence for volume overload at this time. Beta-blocker had to be held due to significant bradycardia yesterday. Patient was experiencing multiple episodes of nonsustained VT. She appeared to be asymptomatic although it was unclear due to her dementia. Electrolytes were corrected. Discussion was held with cardiology. No good options due to her dementia. Patient not a candidate for aggressive interventions. After much discussion patient was started on amiodarone yesterday. QTC was noted to be initially prolonged but subsequently normal after electrolytes were corrected. Telemetry this morning shows significant improvement in her burden of PVCs and nonsustained VT's. Replace potassium this morning. Magnesium is better at 2.0.  Continue amiodarone. Outpatient follow-up with cardiology.  Neck pain No focal neurological deficits noted.  Likely has torticollis.  CT head did not show any acute findings.  CT of the cervical spine did not show any acute findings either. Improvement noted in mobility.  COVID-19 infection Incidentally found positive for COVID-19.  Does not appear to have any respiratory symptoms.  Chest x-ray did  not show any active disease.  Continue to monitor  for now.  Per son they are not aware of any known exposure.  Not a candidate for Remdesivir at this time.  Hypertensive urgency Noted to have elevated blood pressures.  Home medication regimen has been resumed including hydralazine and Cozaar.  Hydralazine as needed.  Holding metoprolol due to bradycardia. Blood pressure high but stable.  Mildly elevated troponin No ischemic changes noted on EKG.  Mild elevation in troponin likely due to demand ischemia.  Global hypokinesis noted on echocardiogram.  Considering her advanced dementia patient not a candidate for aggressive interventions or work-up. Outpatient Follow-up with [facility] cardiology at [facility].  Chronic atrial fibrillation Patient noted to have slow ventricular response on telemetry.  Heart rate occasionally in the 40s.  Mostly in the 53s.  Blood pressure is hypertensive.   Metoprolol was held due to bradycardia. Aricept was also discontinued. Heart rate seems to be better. Continue apixaban. Outpatient follow-up with cardiology.   Hypothyroidism Continue levothyroxine.  TSH noted to be 4.7.  Recommend rechecking in a few weeks.  COPD Stable.  No evidence of exacerbation.  Vascular dementia Longstanding history of advanced vascular dementia.  Hyperlipidemia Continue statin.   DVT Prophylaxis:  Currently on apixaban Code Status: DNR Family Communication: Son being updated daily Disposition Plan: Hopefully return home when improved  Status is: Inpatient  Remains inpatient appropriate because:IV treatments appropriate due to intensity of illness or inability to take PO and Inpatient level of care appropriate due to severity of illness   Dispo:  Patient From: Home  Planned Disposition: Home  Expected discharge date: 07/14/2020  Medically stable for discharge: No           Medications:  Scheduled: . amiodarone  200 mg Oral Daily  . amLODipine  5 mg Oral Daily  . apixaban  5 mg Oral BID  . atorvastatin  20 mg Oral QHS   . hydrALAZINE  100 mg Oral Q8H  . levothyroxine  12.5 mcg Oral Q0600  . losartan  100 mg Oral Daily  . memantine  28 mg Oral QHS  . potassium chloride  40 mEq Oral BID   Continuous:  RCV:[id number] **OR** acetaminophen, albuterol, hydrALAZINE, methocarbamol, ondansetron **OR** ondansetron (ZOFRAN) IV, polyethylene glycol   Objective:  Vital Signs  Vitals:   07/15/20 0355 07/15/20 0555 07/15/20 0625 07/15/20 0909  BP: (!) 153/92 139/71 (!) 159/76 (!) 157/77  Pulse: 60 60  62  Resp:    14  Temp: 97.7 F (36.5 C) 97.7 F (36.5 C)  97.8 F (36.6 C)  TempSrc: Axillary Axillary  Axillary  SpO2:    94%  Weight:        Intake/Output Summary (Last 24 hours) at 07/15/2020 1057 Last data filed at 07/15/2020 0900 Gross per 24 hour  Intake 360 ml  Output --  Net 360 ml   Filed Weights   07/12/20 1825 07/14/20 0434  Weight: 71.6 kg 73.8 kg    General appearance: Much more awake and alert today. Remains confused. Resp: Clear to auscultation bilaterally.  Normal effort Cardio: S1-S2 is irregularly irregular. Bradycardic. Telemetry shows improvement in heart rate.  GI: Abdomen is soft.  Nontender nondistended.  Bowel sounds are present normal.  No masses organomegaly Extremities: No edema.  Neurologic:   No focal neurological deficits.      Lab Results:  Data Reviewed: I have personally reviewed following labs and imaging studies  CBC: Recent Labs  Lab 07/10/20 1122 07/12/20 1124 07/13/20  0022 07/14/20 0246 07/15/20 0256  WBC 4.9 5.2 4.1 5.5 6.9  NEUTROABS  --   --  1.3*  --   --   HGB 14.0 13.8 13.7 13.3 13.5  HCT 41.5 41.0 39.7 41.1 40.6  MCV 95.8 97.4 94.7 96.7 95.1  PLT 234 199 182 186 173    Basic Metabolic Panel: Recent Labs  Lab 07/10/20 1122 07/12/20 1124 07/13/20 0022 07/14/20 0246 07/15/20 0256  NA 142 138 145 142 141  K 3.7 3.5 3.5 3.5 3.8  CL 105 105 107 106 109  CO2 26 21* 29 27 21*  GLUCOSE 105* 165* 92 121* 100*  BUN 11 11 20 16 12    CREATININE 1.26* 0.92 1.12* 0.98 0.84  CALCIUM 9.1 7.5* 8.6* 8.6* 8.5*  MG  --   --  2.1 1.8 2.0    GFR: Estimated Creatinine Clearance: 48.6 mL/min (by C-G formula based on SCr of 0.84 mg/dL).  Liver Function Tests: Recent Labs  Lab 07/13/20 0022  AST 42*  ALT 24  ALKPHOS 71  BILITOT 0.9  PROT 5.9*  ALBUMIN 3.1*     Thyroid Function Tests: Recent Labs    07/12/20 1157  TSH 4.750*      Recent Results (from the past 240 hour(s))  SARS CORONAVIRUS 2 (TAT 6-24 HRS) Nasopharyngeal Nasopharyngeal Swab     Status: Abnormal   Collection Time: 07/12/20  4:19 PM   Specimen: Nasopharyngeal Swab  Result Value Ref Range Status   SARS Coronavirus 2 POSITIVE (A) NEGATIVE Final    Comment: (NOTE) SARS-CoV-2 target nucleic acids are DETECTED.  The SARS-CoV-2 RNA is generally detectable in upper and lower respiratory specimens during the acute phase of infection. Positive results are indicative of the presence of SARS-CoV-2 RNA. Clinical correlation with patient history and other diagnostic information is  necessary to determine patient infection status. Positive results do not rule out bacterial infection or co-infection with other viruses.  The expected result is Negative.  Fact Sheet for Patients: 07/14/20  Fact Sheet for Healthcare Providers: HairSlick.no  This test is not yet approved or cleared by the quierodirigir.com FDA and  has been authorized for detection and/or diagnosis of SARS-CoV-2 by FDA under an Emergency Use Authorization (EUA). This EUA will remain  in effect (meaning this test can be used) for the duration of the COVID-19 declaration under Section 564(b)(1) of the Act, 21 U. S.C. section 360bbb-3(b)(1), unless the authorization is terminated or revoked sooner.   Performed at [facility], [address] [place], [place]       Radiology Studies: EEG  Result Date:  07/13/2020 07/15/2020, MD     07/13/2020 12:19 PM Patient Name: Wanda Mckenzie MRN: Coral [facility] Epilepsy Attending: [id number] Referring Physician/Provider: Dr. Charlsie Quest Date: 07/13/2018 Duration: 25.47 mins Patient history: 85 year old female with 2 episodes of witnessed loss of consciousness.  EEG to evaluate for seizures. Level of alertness: Awake AEDs during EEG study: None Technical aspects: This EEG study was done with scalp electrodes positioned according to the 10-20 International system of electrode placement. Electrical activity was acquired at a sampling rate of 500Hz  and reviewed with a high frequency filter of 70Hz  and a low frequency filter of 1Hz . EEG data were recorded continuously and digitally stored. Description: The posterior dominant rhythm consists of 7.5 Hz activity of moderate voltage (25-35 uV) seen predominantly in posterior head regions, symmetric and reactive to eye opening and eye closing. EEG showed continuous generalized  5 to 7 Hz theta as well as intermittent generalized 2 to 3 Hz delta slowing. Hyperventilation and photic stimulation were not performed.   ABNORMALITY -Continuous slow, generalized IMPRESSION: This study is suggestive of mild to moderate diffuse encephalopathy, nonspecific etiology. No seizures or epileptiform discharges were seen throughout the recording. Charlsie Quest   CT CERVICAL SPINE WO CONTRAST  Result Date: 07/13/2020 CLINICAL DATA:  Neck pain, acute, syncope EXAM: CT CERVICAL SPINE WITHOUT CONTRAST TECHNIQUE: Multidetector CT imaging of the cervical spine was performed without intravenous contrast. Multiplanar CT image reconstructions were also generated. COMPARISON:  None FINDINGS: Alignment: Focal kyphosis and slight anterolisthesis at C5-C6, with question C5-C6 fusion. Remaining alignments normal Skull base and vertebrae: Mild osseous demineralization. Skull base intact. Vertebral body heights maintained. Multilevel disc space  narrowing and endplate spur formation. Scattered facet degenerative changes. Facet fusion at C5-C6. No acute fracture or bone destruction. Soft tissues and spinal canal: Prevertebral soft tissues normal thickness. No regional soft tissue abnormalities. Scattered atherosclerotic calcifications of internal carotid, common carotid, and vertebral arteries as well as proximal great vessels. Disc levels:  No specific abnormalities Upper chest: Lung apices clear Other: N/A IMPRESSION: Multilevel degenerative disc and facet disease changes of the cervical spine as above. Focal kyphosis and slight anterolisthesis at C5-C6, with with suspect fusion of vertebral bodies and facet joints bilaterally. No acute cervical spine abnormalities. Electronically Signed   By: Ulyses Southward M.D.   On: 07/13/2020 13:01       LOS: 2 days   Leemon Ayala Rito Ehrlich  [facility] Pager on [email or web address]  07/15/2020, 10:57 AM

## 2020-07-15 NOTE — Plan of Care (Signed)

## 2020-07-15 NOTE — Evaluation (Signed)
Occupational Therapy Evaluation Patient Details Name: Wanda Mckenzie MRN: [id number] DOB: 1936-03-27 Today's Date: 07/15/2020    History of Present Illness Pt adm with 2 episodes of syncope witnessed by the family. PMH - ckd, chf, vascular dementia, afib, HTN, copd, cva, lt hip hemiarthroplasty.   Clinical Impression   Prior level of functioning unknown secondary to pt with advanced dementia. Per chart, pt lives with husband and was ambulatory in the home. Pt presents now unable to follow one step commands and with decreased attention span. Pt able to accurately tell me her name but most other statements nonsensical. On attempts to engage pt in OOB activities, pt noted with bowel incontinence in bed. Pt requires Total A for clean up after incontinence, benefits from +2 physical assist with rolling due to pushing tendency. Pt overall requires Total A for UB/LB ADLs. Recommend DC to SNF. Plan to further address OOB activities, sequencing basic tasks and following of one step commands during ADLs. VSS on RA    Follow Up Recommendations  SNF;Supervision/Assistance - 24 hour    Equipment Recommendations  Hospital bed;Wheelchair (measurements OT);Wheelchair cushion (measurements OT)    Recommendations for Other Services       Precautions / Restrictions Precautions Precautions: Fall Restrictions Weight Bearing Restrictions: No      Mobility Bed Mobility Overal bed mobility: Needs Assistance Bed Mobility: Rolling Rolling: Total assist;+2 for safety/equipment;+2 for physical assistance         General bed mobility comments: Total A for rolling, +2 assist for efficient rolling for peri care    Transfers                 General transfer comment: unable to attempt - limited by bowel incontinence    Balance                                           ADL either performed or assessed with clinical judgement   ADL Overall ADL's : Needs  assistance/impaired Eating/Feeding: Moderate assistance;Bed level   Grooming: Bed level;Total assistance Grooming Details (indicate cue type and reason): Total A to wash face bed level Upper Body Bathing: Total assistance;Bed level   Lower Body Bathing: Total assistance;Bed level   Upper Body Dressing : Total assistance;Bed level   Lower Body Dressing: Total assistance;Bed level Lower Body Dressing Details (indicate cue type and reason): Total A to don socks bed level     Toileting- Clothing Manipulation and Hygiene: Total assistance;+2 for physical assistance;Bed level Toileting - Clothing Manipulation Details (indicate cue type and reason): Total A x 2 for cleanup after bowel incontinence. +2 needed due to pt pushing against bedrail when attempting to roll, difficult to redirect       General ADL Comments: Pt with advanced dementia, requiring extensive assist for all ADLs at this time. Unsure of baseline status     Vision Patient Visual Report: No change from baseline Vision Assessment?: No apparent visual deficits     Perception     Praxis      Pertinent Vitals/Pain Pain Assessment: Faces Faces Pain Scale: Hurts little more Pain Location: neck or L LE when rolling in bed Pain Descriptors / Indicators: Grimacing;Guarding Pain Intervention(s): Monitored during session     Hand Dominance Right   Extremity/Trunk Assessment Upper Extremity Assessment Upper Extremity Assessment: Difficult to assess due to impaired cognition   Lower Extremity Assessment Lower  Extremity Assessment: Defer to PT evaluation       Communication Communication Communication: HOH   Cognition Arousal/Alertness: Awake/alert Behavior During Therapy: Flat affect;Restless Overall Cognitive Status: History of cognitive impairments - at baseline                                 General Comments: advanced dementia, unable to follow one step commands, fidgety, unable to answer many  questions (except for name) and nonsensical statements   General Comments  BP at rest 162/86 at rest, HR and spO2 WFL on RA. Pt noted with bowel incontinence when attempting to engage pt in OOB activity    Exercises     Shoulder Instructions      Home Living Family/patient expects to be discharged to:: Private residence Living Arrangements: Spouse/significant other                               Additional Comments: Pt unable to provide home setup info      Prior Functioning/Environment          Comments: unsure of functional status. ED note states she had syncope while amb so it appears she is at least somewhat ambulatory        OT Problem List: Decreased strength;Decreased activity tolerance;Impaired balance (sitting and/or standing);Decreased cognition;Decreased coordination;Decreased safety awareness      OT Treatment/Interventions: Self-care/ADL training;Therapeutic exercise;DME and/or AE instruction;Therapeutic activities;Patient/family education;Balance training    OT Goals(Current goals can be found in the care plan section) Acute Rehab OT Goals Patient Stated Goal: unable to state OT Goal Formulation: Patient unable to participate in goal setting Time For Goal Achievement: 08/26/20 Potential to Achieve Goals: Fair ADL Goals Pt Will Perform Eating: with min assist;bed level Pt Will Perform Grooming: with min assist;sitting Pt Will Perform Upper Body Bathing: with mod assist;sitting Additional ADL Goal #1: Pt to demonstrate bed mobility at Min A in prep for ADL transfers Additional ADL Goal #2: Pt to demonstrate ability to follow one step commands > 50% of the time  OT Frequency: Min 2X/week   Barriers to D/C:            Co-evaluation              AM-PAC OT "6 Clicks" Daily Activity     Outcome Measure Help from another person eating meals?: A Lot Help from another person taking care of personal grooming?: Total Help from another person  toileting, which includes using toliet, bedpan, or urinal?: Total Help from another person bathing (including washing, rinsing, drying)?: Total Help from another person to put on and taking off regular upper body clothing?: Total Help from another person to put on and taking off regular lower body clothing?: Total 6 Click Score: 7   End of Session Nurse Communication: Mobility status  Activity Tolerance: Treatment limited secondary to medical complications (Comment) Patient left: in bed;with bed alarm set;with call bell/phone within reach  OT Visit Diagnosis: Unsteadiness on feet (R26.81);Other abnormalities of gait and mobility (R26.89);Muscle weakness (generalized) (M62.81);Other symptoms and signs involving cognitive function                Time: 6659-9357 OT Time Calculation (min): 29 min Charges:  OT General Charges $OT Visit: 1 Visit OT Evaluation $OT Eval Moderate Complexity: 1 Mod OT Treatments $Self Care/Home Management : 8-22 mins  Lorre Munroe, OTR/L  Raynelle Fanning  Kavir Savoca 07/15/2020, 3:26 PM

## 2020-07-16 DIAGNOSIS — I5022 Chronic systolic (congestive) heart failure: Secondary | ICD-10-CM

## 2020-07-16 LAB — BASIC METABOLIC PANEL
Anion gap: 10 (ref 5–15)
BUN: 12 mg/dL (ref 8–23)
CO2: 24 mmol/L (ref 22–32)
Calcium: 8.3 mg/dL — ABNORMAL LOW (ref 8.9–10.3)
Chloride: 106 mmol/L (ref 98–111)
Creatinine, Ser: 0.85 mg/dL (ref 0.44–1.00)
GFR, Estimated: >60 mL/min (ref >60)
Glucose, Bld: 96 mg/dL (ref 70–99)
Potassium: 3.6 mmol/L (ref 3.5–5.1)
Sodium: 140 mmol/L (ref 135–145)

## 2020-07-16 LAB — CBC
HCT: 36.5 % (ref 36.0–46.0)
Hemoglobin: 12.5 g/dL (ref 12.0–15.0)
MCH: 32.2 pg (ref 26.0–34.0)
MCHC: 34.2 g/dL (ref 30.0–36.0)
MCV: 94.1 fL (ref 80.0–100.0)
Platelets: 154 10*3/uL (ref 150–400)
RBC: 3.88 MIL/uL (ref 3.87–5.11)
RDW: 13.4 % (ref 11.5–15.5)
WBC: 6.7 10*3/uL (ref 4.0–10.5)
nRBC: 0 % (ref 0.0–0.2)

## 2020-07-16 LAB — MAGNESIUM: Magnesium: 1.9 mg/dL (ref 1.7–2.4)

## 2020-07-16 MED ORDER — AMLODIPINE BESYLATE 5 MG PO TABS: 5.0000 mg | ORAL_TABLET | Freq: Every day | ORAL | 1 refills | Status: DC

## 2020-07-16 MED ORDER — MEMANTINE HCL ER 28 MG PO CP24: 28.0000 mg | ORAL_CAPSULE | Freq: Every day | ORAL | 1 refills | Status: DC

## 2020-07-16 MED ORDER — POTASSIUM CHLORIDE CRYS ER 20 MEQ PO TBCR: 20.0000 meq | EXTENDED_RELEASE_TABLET | Freq: Every day | ORAL | 1 refills | Status: DC

## 2020-07-16 MED ORDER — FUROSEMIDE 20 MG PO TABS: ORAL_TABLET | ORAL | 0 refills | Status: DC

## 2020-07-16 MED ORDER — AMIODARONE HCL 200 MG PO TABS: 200.0000 mg | ORAL_TABLET | Freq: Every day | ORAL | 1 refills | Status: DC

## 2020-07-16 MED ORDER — POTASSIUM CHLORIDE CRYS ER 20 MEQ PO TBCR
40.0000 meq | EXTENDED_RELEASE_TABLET | Freq: Once | ORAL | Status: AC
  Administered 2020-07-16: 40 meq via ORAL
  Filled 2020-07-16: qty 2

## 2020-07-16 MED ORDER — METHOCARBAMOL 500 MG PO TABS: 500.0000 mg | ORAL_TABLET | Freq: Three times a day (TID) | ORAL | 0 refills | Status: DC | PRN

## 2020-07-16 NOTE — Discharge Instructions (Signed)
Information on my medicine - ELIQUIS® (apixaban) ° °This medication education was reviewed with me or my healthcare representative as part of my discharge preparation.  The pharmacist that spoke with me during my hospital stay was:  Wilson, Frank Rhea, RPH ° °Why was Eliquis® prescribed for you? °Eliquis® was prescribed for you to reduce the risk of a blood clot forming that can cause a stroke if you have a medical condition called atrial fibrillation (a type of irregular heartbeat). ° °What do You need to know about Eliquis® ? °Take your Eliquis® TWICE DAILY - one tablet in the morning and one tablet in the evening with or without food. If you have difficulty swallowing the tablet whole please discuss with your pharmacist how to take the medication safely. ° °Take Eliquis® exactly as prescribed by your doctor and DO NOT stop taking Eliquis® without talking to the doctor who prescribed the medication.  Stopping may increase your risk of developing a stroke.  Refill your prescription before you run out. ° °After discharge, you should have regular check-up appointments with your healthcare provider that is prescribing your Eliquis®.  In the future your dose may need to be changed if your kidney function or weight changes by a significant amount or as you get older. ° °What do you do if you miss a dose? °If you miss a dose, take it as soon as you remember on the same day and resume taking twice daily.  Do not take more than one dose of ELIQUIS at the same time to make up a missed dose. ° °Important Safety Information °A possible side effect of Eliquis® is bleeding. You should call your healthcare provider right away if you experience any of the following: °  Bleeding from an injury or your nose that does not stop. °  Unusual colored urine (red or dark brown) or unusual colored stools (red or black). °  Unusual bruising for unknown reasons. °  A serious fall or if you hit your head (even if there is no  bleeding). ° °Some medicines may interact with Eliquis® and might increase your risk of bleeding or clotting while on Eliquis®. To help avoid this, consult your healthcare provider or pharmacist prior to using any new prescription or non-prescription medications, including herbals, vitamins, non-steroidal anti-inflammatory drugs (NSAIDs) and supplements. ° °This website has more information on Eliquis® (apixaban): http://www.eliquis.com/eliquis/home ° °

## 2020-07-16 NOTE — Discharge Summary (Signed)
[facility]  Physician Discharge Summary   Patient ID: CARAH ARNWINE MRN: [id number] DOB/AGE: [birth year] 83 y.o.  Admit date: 07/12/2020 Discharge date: 07/16/2020  PCP: [name], FNP  DISCHARGE DIAGNOSES:  Syncope likely secondary to orthostatic hypotension Chronic systolic CHF PVCs and episodes of nonsustained VT Neck pain likely due to torticollis COVID-19 infection, incidental Essential hypertension Mildly elevated troponin Chronic atrial fibrillation Hypothyroidism COPD Vascular dementia  RECOMMENDATIONS FOR OUTPATIENT FOLLOW UP: 1. Message sent to cardiology to arrange outpatient appointment    Home Health: PT and OT Equipment/Devices: Hospital bed wheelchair  CODE STATUS: DNR  DISCHARGE CONDITION: fair  Diet recommendation: As before  INITIAL HISTORY: 85 year old with past medical history of chronic kidney disease stage IIIb, systolic congestive heart failure(Echo 04/2019 EF 40-45%),hyperlipidemia, vascular dementia, chronic atrial fibrillation, hypertension, COPD and previous CVA with 2 episodes of loss of consciousness witnessed by family.  Patient was hospitalized for similar issues in 2020.  History was limited as patient is a poor historian due to dementia.   Consultations:  None  Procedures: Echocardiogram IMPRESSIONS  1. Left ventricular ejection fraction, by estimation, is 35 to 40%. The left ventricle has moderately decreased function. The left ventricle demonstrates global hypokinesis. Left ventricular diastolic parameters are indeterminate.  2. Right ventricular systolic function is normal. The right ventricular size is normal. There is normal pulmonary artery systolic pressure.  3. The mitral valve is normal in structure. Mild mitral valve regurgitation. No evidence of mitral stenosis.  4. The aortic valve is tricuspid. There is mild calcification of the aortic valve. There is mild thickening of the aortic valve. Aortic valve  regurgitation is moderate. No aortic stenosis is present.  EEG This study is suggestive of mildtomoderate diffuse encephalopathy, nonspecific etiology.No seizures or epileptiform discharges were seen throughout the recording.   HOSPITAL COURSE:   Syncope likely due to orthostatic hypotension Based on history it appears that she may have had orthostatic hypotension.  Family reported that she passed out whenever she went from a lying or sitting position to standing position.  Patient was hydrated.   EEG did not show any epileptiform activity.   Echocardiogram shows slight worsening in her systolic function with EF down to 35 to 40% from 40 to 45% previously.  Global hypokinesis was noted.  No significant valvular abnormalities noted. Patient seen by PT and OT.  Home health was recommended.   Chronic systolic CHF with multiple episodes of nonsustained VT Patient's EF is noted to be 35 to 40%. Worse from what it was previously. No evidence for volume overload at this time. Beta-blocker had to be held due to significant bradycardia yesterday. Patient was experiencing multiple episodes of nonsustained VT. She appeared to be asymptomatic although it was unclear due to her dementia. Electrolytes were corrected. Discussion was held with cardiology, Dr. Shari Prows. No good options due to her dementia. Patient not a candidate for aggressive interventions. After much discussion patient was started on amiodarone.  Subsequently telemetry shows improvement in the burden of PVCs and nonsustained VT. Continue amiodarone at current dose.  Outpatient follow-up with cardiology.  Neck pain No focal neurological deficits noted.  Likely has torticollis.  CT head did not show any acute findings.  CT of the cervical spine did not show any acute findings either. Improvement noted in mobility.  COVID-19 infection Incidentally found positive for COVID-19.  Does not appear to have any respiratory symptoms.  Chest  x-ray did not show any active disease.    Can come off  of isolation on 1/28.  Essential hypertension Noted to have elevated blood pressures.  Home medication regimen has been resumed including hydralazine and Cozaar.  Hydralazine as needed.    Metoprolol discontinued due to significant bradycardia.  Started on amlodipine  Mildly elevated troponin No ischemic changes noted on EKG.  Mild elevation in troponin likely due to demand ischemia.  Global hypokinesis noted on echocardiogram.  Considering her advanced dementia patient not a candidate for aggressive interventions or work-up. Outpatient Follow-up with [facility] cardiology at [facility].  Chronic atrial fibrillation Patient noted to have slow ventricular response on telemetry.  Heart rate occasionally in the 40s.  Mostly in the 91s.  Blood pressure is hypertensive.   Metoprolol was held due to bradycardia. Aricept was also discontinued.  Heart rate has improved.  Continue apixaban.   Outpatient follow-up with cardiology.   Hypothyroidism Continue levothyroxine.  TSH noted to be 4.7.  Recommend rechecking in a few weeks.  COPD Stable.  No evidence of exacerbation.  Vascular dementia Longstanding history of advanced vascular dementia.  Noted to be on combination tablet of Namenda and Aricept.  Aricept can cause significant bradycardia so has been discontinued.  Prescription given for Namenda.  Hyperlipidemia Continue statin.   Overall stable.  Home health recommended by PT with supervision around-the-clock.  Discussed with son who states that there is always somebody at home with the patient and her husband to assist.  Hospital bed and wheelchair has been ordered.  Home health has been ordered.  Okay for discharge today.   PERTINENT LABS:  The results of significant diagnostics from this hospitalization (including imaging, microbiology, ancillary and laboratory) are listed below for reference.    Microbiology: Recent Results (from  the past 240 hour(s))  SARS CORONAVIRUS 2 (TAT 6-24 HRS) Nasopharyngeal Nasopharyngeal Swab     Status: Abnormal   Collection Time: 07/12/20  4:19 PM   Specimen: Nasopharyngeal Swab  Result Value Ref Range Status   SARS Coronavirus 2 POSITIVE (A) NEGATIVE Final    Comment: (NOTE) SARS-CoV-2 target nucleic acids are DETECTED.  The SARS-CoV-2 RNA is generally detectable in upper and lower respiratory specimens during the acute phase of infection. Positive results are indicative of the presence of SARS-CoV-2 RNA. Clinical correlation with patient history and other diagnostic information is  necessary to determine patient infection status. Positive results do not rule out bacterial infection or co-infection with other viruses.  The expected result is Negative.  Fact Sheet for Patients: HairSlick.no  Fact Sheet for Healthcare Providers: quierodirigir.com  This test is not yet approved or cleared by the [place] FDA and  has been authorized for detection and/or diagnosis of SARS-CoV-2 by FDA under an Emergency Use Authorization (EUA). This EUA will remain  in effect (meaning this test can be used) for the duration of the COVID-19 declaration under Section 564(b)(1) of the Act, 21 U. S.C. section 360bbb-3(b)(1), unless the authorization is terminated or revoked sooner.   Performed at [facility], [address] [place], [place] [address]      Labs:  COVID-19 Labs   Lab Results  Component Value Date   SARSCOV2NAA POSITIVE (A) 07/12/2020   SARSCOV2NAA NEGATIVE 05/18/2019   SARSCOV2NAA NEGATIVE 05/14/2019      Basic Metabolic Panel: Recent Labs  Lab 07/12/20 1124 07/13/20 0022 07/14/20 0246 07/15/20 0256 07/16/20 0820  NA 138 145 142 141 140  K 3.5 3.5 3.5 3.8 3.6  CL 105 107 106 109 106  CO2 21* 29 27 21*  24  GLUCOSE 165* 92 121* 100* 96  BUN 11 20 16 12 12   CREATININE 0.92 1.12* 0.98 0.84 0.85   CALCIUM 7.5* 8.6* 8.6* 8.5* 8.3*  MG  --  2.1 1.8 2.0 1.9   Liver Function Tests: Recent Labs  Lab 07/13/20 0022  AST 42*  ALT 24  ALKPHOS 71  BILITOT 0.9  PROT 5.9*  ALBUMIN 3.1*   CBC: Recent Labs  Lab 07/12/20 1124 07/13/20 0022 07/14/20 0246 07/15/20 0256 07/16/20 0820  WBC 5.2 4.1 5.5 6.9 6.7  NEUTROABS  --  1.3*  --   --   --   HGB 13.8 13.7 13.3 13.5 12.5  HCT 41.0 39.7 41.1 40.6 36.5  MCV 97.4 94.7 96.7 95.1 94.1  PLT 199 182 186 173 154     IMAGING STUDIES EEG  Result Date: 07/13/2020 Charlsie Quest, MD     07/13/2020 12:19 PM Patient Name: TAKEYLA MILLION MRN: [id number] Epilepsy Attending: Charlsie Quest Referring Physician/Provider: Dr. Shauna Hugh Date: 07/13/2018 Duration: 25.47 mins Patient history: 85 year old female with 2 episodes of witnessed loss of consciousness.  EEG to evaluate for seizures. Level of alertness: Awake AEDs during EEG study: None Technical aspects: This EEG study was done with scalp electrodes positioned according to the 10-20 International system of electrode placement. Electrical activity was acquired at a sampling rate of 500Hz  and reviewed with a high frequency filter of 70Hz  and a low frequency filter of 1Hz . EEG data were recorded continuously and digitally stored. Description: The posterior dominant rhythm consists of 7.5 Hz activity of moderate voltage (25-35 uV) seen predominantly in posterior head regions, symmetric and reactive to eye opening and eye closing. EEG showed continuous generalized 5 to 7 Hz theta as well as intermittent generalized 2 to 3 Hz delta slowing. Hyperventilation and photic stimulation were not performed.   ABNORMALITY -Continuous slow, generalized IMPRESSION: This study is suggestive of mild to moderate diffuse encephalopathy, nonspecific etiology. No seizures or epileptiform discharges were seen throughout the recording. Charlsie Quest   DG Chest 1 View  Result Date: 07/12/2020 CLINICAL DATA:   Syncope EXAM: CHEST  1 VIEW COMPARISON:  05/14/2019 FINDINGS: Cardiomegaly. No focal opacity or pleural effusion. Aortic atherosclerosis. No pneumothorax. IMPRESSION: No active disease. Cardiomegaly. Electronically Signed   By: Jasmine Pang M.D.   On: 07/12/2020 16:19   CT Head Wo Contrast  Result Date: 07/12/2020 CLINICAL DATA:  Recurrent syncopal episodes. EXAM: CT HEAD WITHOUT CONTRAST TECHNIQUE: Contiguous axial images were obtained from the base of the skull through the vertex without intravenous contrast. COMPARISON:  Head CT 07/10/2020 FINDINGS: Brain: Stable age related cerebral atrophy, ventriculomegaly and periventricular white matter disease. Remote right temporal/parietal infarct with encephalomalacia. Remote right occipital infarct. No extra-axial fluid collections are identified. No CT findings for acute hemispheric infarction or intracranial hemorrhage. No mass lesions. The brainstem and cerebellum are normal. Vascular: Stable vascular calcifications.  No hyperdense vessels. Skull: No acute skull fracture or worrisome bone lesions. Stable hyperostosis frontalis interna. Sinuses/Orbits: The paranasal sinuses and mastoid air cells are clear. The globes are intact. Other: No scalp lesions or scalp hematoma. IMPRESSION: 1. Stable age related cerebral atrophy, ventriculomegaly and periventricular white matter disease. 2. Remote right temporal/parietal and right occipital infarcts. 3. No acute intracranial findings or mass lesions. Electronically Signed   By: Rudie Meyer M.D.   On: 07/12/2020 13:41   CT HEAD WO CONTRAST  Result Date: 07/10/2020 CLINICAL DATA:  Syncope EXAM: CT HEAD WITHOUT CONTRAST TECHNIQUE:  Contiguous axial images were obtained from the base of the skull through the vertex without intravenous contrast. COMPARISON:  05/14/2019 FINDINGS: Brain: There is no acute intracranial hemorrhage, mass effect, or edema. No new loss of gray-white differentiation. Chronic frontal, temporal,  anterior insula, and right occipital infarctions. Additional patchy and confluent areas of hypoattenuation in the supratentorial white matter nonspecific but probably reflects stable chronic microvascular ischemic changes. There is no extra-axial fluid collection. Prominence of the ventricles and sulci reflects stable parenchymal volume loss. Vascular: There is atherosclerotic calcification at the skull base. Skull: Calvarium is unremarkable. Sinuses/Orbits: Mild mucosal thickening. Retained secretions in the left sphenoid sinus. Orbits are unremarkable. Other: Minimal patchy left mastoid opacification. IMPRESSION: No acute intracranial abnormality. Stable chronic/nonemergent findings detailed above. Electronically Signed   By: Guadlupe Spanish M.D.   On: 07/10/2020 12:36   CT CERVICAL SPINE WO CONTRAST  Result Date: 07/13/2020 CLINICAL DATA:  Neck pain, acute, syncope EXAM: CT CERVICAL SPINE WITHOUT CONTRAST TECHNIQUE: Multidetector CT imaging of the cervical spine was performed without intravenous contrast. Multiplanar CT image reconstructions were also generated. COMPARISON:  None FINDINGS: Alignment: Focal kyphosis and slight anterolisthesis at C5-C6, with question C5-C6 fusion. Remaining alignments normal Skull base and vertebrae: Mild osseous demineralization. Skull base intact. Vertebral body heights maintained. Multilevel disc space narrowing and endplate spur formation. Scattered facet degenerative changes. Facet fusion at C5-C6. No acute fracture or bone destruction. Soft tissues and spinal canal: Prevertebral soft tissues normal thickness. No regional soft tissue abnormalities. Scattered atherosclerotic calcifications of internal carotid, common carotid, and vertebral arteries as well as proximal great vessels. Disc levels:  No specific abnormalities Upper chest: Lung apices clear Other: N/A IMPRESSION: Multilevel degenerative disc and facet disease changes of the cervical spine as above. Focal kyphosis  and slight anterolisthesis at C5-C6, with with suspect fusion of vertebral bodies and facet joints bilaterally. No acute cervical spine abnormalities. Electronically Signed   By: Ulyses Southward M.D.   On: 07/13/2020 13:01   DG Abdomen Acute W/Chest  Result Date: 07/12/2020 CLINICAL DATA:  Abdominal pain EXAM: DG ABDOMEN ACUTE WITH 1 VIEW CHEST COMPARISON:  None. FINDINGS: There are nondilated air-filled loops of small bowel in the right abdomen and pelvis. No free air below the diaphragms. Lungs are clear. Partially imaged left hip arthroplasty. Lumbar spine degenerative changes. IMPRESSION: Nonobstructive bowel gas pattern. Electronically Signed   By: Guadlupe Spanish M.D.   On: 07/12/2020 12:17   ECHOCARDIOGRAM LIMITED  Result Date: 07/13/2020    ECHOCARDIOGRAM LIMITED REPORT   Patient Name:   OLIVIYA GILKISON Date of Exam: 07/13/2020 Medical Rec #:  [id number]       Height:       62.0 in Accession #:    [id number]      Weight:       157.8 lb Date of Birth:  1935/12/01      BSA:          1.729 m Patient Age:    84 years        BP:           184/88 mmHg Patient Gender: F               HR:           63 bpm. Exam Location:  Inpatient Procedure: Limited Echo, Limited Color Doppler and Cardiac Doppler Indications:    syncope 780.2  History:        Patient has prior history of Echocardiogram examinations, most  recent 05/14/2019. Cardiomyopathy and CHF, COPD and COVID.                 chronic kidney disease, Arrythmias:Atrial Fibrillation,                 Signs/Symptoms:Syncope; Risk Factors:Hypertension and                 Dyslipidemia.  Sonographer:    Delcie Roch Referring Phys: [id number] Deno Lunger [place]ALHOUB IMPRESSIONS  1. Left ventricular ejection fraction, by estimation, is 35 to 40%. The left ventricle has moderately decreased function. The left ventricle demonstrates global hypokinesis. Left ventricular diastolic parameters are indeterminate.  2. Right ventricular systolic function is normal.  The right ventricular size is normal. There is normal pulmonary artery systolic pressure.  3. The mitral valve is normal in structure. Mild mitral valve regurgitation. No evidence of mitral stenosis.  4. The aortic valve is tricuspid. There is mild calcification of the aortic valve. There is mild thickening of the aortic valve. Aortic valve regurgitation is moderate. No aortic stenosis is present. FINDINGS  Left Ventricle: Left ventricular ejection fraction, by estimation, is 35 to 40%. The left ventricle has moderately decreased function. The left ventricle demonstrates global hypokinesis. Left ventricular diastolic parameters are indeterminate. Right Ventricle: The right ventricular size is normal. No increase in right ventricular wall thickness. Right ventricular systolic function is normal. There is normal pulmonary artery systolic pressure. The tricuspid regurgitant velocity is 2.62 m/s, and  with an assumed right atrial pressure of 3 mmHg, the estimated right ventricular systolic pressure is 30.5 mmHg. Left Atrium: Left atrial size was normal in size. Right Atrium: Right atrial size was normal in size. Pericardium: Trivial pericardial effusion is present. Mitral Valve: The mitral valve is normal in structure. Mild mitral valve regurgitation. No evidence of mitral valve stenosis. Tricuspid Valve: Tricuspid valve regurgitation is mild . No evidence of tricuspid stenosis. Aortic Valve: The aortic valve is tricuspid. There is mild calcification of the aortic valve. There is mild thickening of the aortic valve. Aortic valve regurgitation is moderate. No aortic stenosis is present. Pulmonic Valve: The pulmonic valve was normal in structure. Pulmonic valve regurgitation is not visualized. No evidence of pulmonic stenosis. Aorta: The aortic root is normal in size and structure. LEFT VENTRICLE PLAX 2D LVIDd:         5.00 cm LVIDs:         4.00 cm LV PW:         1.00 cm LV IVS:        1.10 cm LVOT diam:     2.10 cm LVOT  Area:     3.46 cm  LEFT ATRIUM         Index LA diam:    4.30 cm 2.49 cm/m   AORTA Ao Root diam: 2.80 cm Ao Asc diam:  2.80 cm TRICUSPID VALVE TR Peak grad:   27.5 mmHg TR Vmax:        262.00 cm/s  SHUNTS Systemic Diam: 2.10 cm Chilton Si MD Electronically signed by Chilton Si MD Signature Date/Time: 07/13/2020/2:22:28 PM    Final     DISCHARGE EXAMINATION: Vitals:   07/15/20 1925 07/15/20 2335 07/16/20 0518 07/16/20 1042  BP: (!) 156/87 128/66 (!) 141/77 (!) 145/68  Pulse: 70  72   Resp:   16   Temp: 98.5 F (36.9 C)  98.5 F (36.9 C)   TempSrc: Axillary  Axillary   SpO2:   95%   Weight:  General appearance: Awake alert.  In no distress.  Pleasantly confused Resp: Clear to auscultation bilaterally.  Normal effort Cardio: S1-S2 is normal regular.  No S3-S4.  No rubs murmurs or bruit GI: Abdomen is soft.  Nontender nondistended.  Bowel sounds are present normal.  No masses organomegaly    DISPOSITION: Home with home health  Discharge Instructions    Call MD for:  difficulty breathing, headache or visual disturbances   Complete by: As directed    Call MD for:  extreme fatigue   Complete by: As directed    Call MD for:  persistant dizziness or light-headedness   Complete by: As directed    Call MD for:  persistant nausea and vomiting   Complete by: As directed    Call MD for:  severe uncontrolled pain   Complete by: As directed    Call MD for:  temperature >100.4   Complete by: As directed    Diet - low sodium heart healthy   Complete by: As directed    Discharge instructions   Complete by: As directed    Please take your medications as prescribed.  Please note changes made to some of your home medications.  Message will be sent to the cardiology office to arrange outpatient appointment.  You will need to see your primary care provider in 1 to 2 weeks.  You were cared for by a hospitalist during your hospital stay. If you have any questions about your  discharge medications or the care you received while you were in the hospital after you are discharged, you can call the unit and asked to speak with the hospitalist on call if the hospitalist that took care of you is not available. Once you are discharged, your primary care physician will handle any further medical issues. Please note that NO REFILLS for any discharge medications will be authorized once you are discharged, as it is imperative that you return to your primary care physician (or establish a relationship with a primary care physician if you do not have one) for your aftercare needs so that they can reassess your need for medications and monitor your lab values. If you do not have a primary care physician, you can call [phone] for a physician referral.   Increase activity slowly   Complete by: As directed         Allergies as of 07/16/2020      Reactions   Codeine Nausea And Vomiting      Medication List    STOP taking these medications   metoprolol succinate 50 MG 24 hr tablet Commonly known as: Toprol XL   Namzaric 28-10 MG Cp24 Generic drug: Memantine HCl-Donepezil HCl     TAKE these medications   acetaminophen 500 MG tablet Commonly known as: TYLENOL Take 1,000 mg by mouth every 6 (six) hours as needed for headache (pain).   AeroChamber Plus inhaler Use as instructed   albuterol 108 (90 Base) MCG/ACT inhaler Commonly known as: VENTOLIN HFA Inhale 2 puffs into the lungs every 6 (six) hours as needed for wheezing or shortness of breath.   amiodarone 200 MG tablet Commonly known as: PACERONE Take 1 tablet (200 mg total) by mouth daily. Start taking on: July 17, 2020   amLODipine 5 MG tablet Commonly known as: NORVASC Take 1 tablet (5 mg total) by mouth daily. Start taking on: July 17, 2020   atorvastatin 20 MG tablet Commonly known as: LIPITOR TAKE (1) TABLET DAILY AT BEDTIME. What changed:  See the new instructions.   diclofenac Sodium 1 %  Gel Commonly known as: VOLTAREN Apply 2 g topically 4 (four) times daily.   Eliquis 5 MG Tabs tablet Generic drug: apixaban TAKE ONE TABLET TWICE A DAY. What changed: how much to take   furosemide 20 MG tablet Commonly known as: LASIX Take 1 tablet (20mg ) three times a week: Monday, Wednesday and Friday. What changed:   medication strength  how much to take  how to take this  when to take this  additional instructions   hydrALAZINE 100 MG tablet Commonly known as: APRESOLINE Take 1 tablet (100 mg total) by mouth every 8 (eight) hours.   levothyroxine 25 MCG tablet Commonly known as: SYNTHROID TAKE (1/2) TABLET DAILY. What changed:   how much to take  how to take this  when to take this  additional instructions   losartan 100 MG tablet Commonly known as: COZAAR Take 1 tablet (100 mg total) by mouth daily.   memantine 28 MG Cp24 24 hr capsule Commonly known as: NAMENDA XR Take 1 capsule (28 mg total) by mouth daily.   methocarbamol 500 MG tablet Commonly known as: ROBAXIN Take 1 tablet (500 mg total) by mouth every 8 (eight) hours as needed for muscle spasms.   potassium chloride SA 20 MEQ tablet Commonly known as: KLOR-CON Take 1 tablet (20 mEq total) by mouth daily. What changed: how much to take            Durable Medical Equipment  (From admission, onward)         Start     Ordered   07/16/20 1226  For home use only DME lightweight manual wheelchair with seat cushion  Once       Comments: Patient suffers from generalized weakness which impairs their ability to perform daily activities like ambulating in the home.  A walker will not resolve  issue with performing activities of daily living. A wheelchair will allow patient to safely perform daily activities. Patient is not able to propel themselves in the home using a standard weight wheelchair due to weakness. Patient can self propel in the lightweight wheelchair. Length of need  72months Accessories: elevating leg rests (ELRs), wheel locks, extensions and anti-tippers.   07/16/20 1228   07/16/20 1224  For home use only DME Hospital bed  Once       Question Answer Comment  Length of Need 12 Months   Patient has (list medical condition): generalized weakness, Hx CVA, dementia   The above medical condition requires: Patient requires the ability to reposition frequently   Head must be elevated greater than: 45 degrees   Bed type Semi-electric      07/16/20 1225            Follow-up Information    07/18/20, FNP. Schedule an appointment as soon as possible for a visit in 1 week(s).   Specialty: Family Medicine Contact information: [address] [place] [phone]        [phone], MD Follow up.   Specialty: Cardiology Why: office will contact you. Contact information: [address] [place] [phone]        Care, [facility] [facility] Follow up.   Specialty: Home Health Services Why: A representative from [facility] will contact you to arrange start date and time for your therapy. Contact information: [address] [place] [phone]  TOTAL DISCHARGE TIME: 35 minutes  Anylah Scheib [employer] on [email or web address]  07/16/2020, 2:46 PM

## 2020-07-16 NOTE — TOC Transition Note (Addendum)
Transition of Care [facility]) - CM/SW Discharge Note   Patient Details  Name: Wanda Mckenzie MRN: [id number] Date of Birth: 1935/09/27  Transition of [facility]) CM/SW Contact:  Durenda Guthrie, RN Phone Number: 07/16/2020, 12:47 PM   Clinical Narrative:   Case manager spoke with Patient's son- Nadene Witherspoon [phone] concerning his mom's discharge plan. Discussed Home Health Agencies, he states he believes they used [facility] previously and wants to do so now. Leonette Most is to be called to arrange Home visits.  Referral called to Lorenza Chick, [facility] Liaison. Leonette Most states that they have CNA's that care for his mom24/7,  they have a brand new wheelchair and that his mom's bed is adjustable so they don't need a hospital bed. Should that change he has access to one. He will pick his mom up after he finishes work today. No further needs identified by Case Manager.    Final next level of care: Home w Home Health Services Barriers to Discharge: No Barriers Identified   Patient Goals and CMS Choice     Choice offered to / list presented to : Adult Children  Discharge Placement                       Discharge Plan and Services   Discharge Planning Services: CM Consult Post Acute Care Choice: Durable Medical Equipment,Home Health          DME Arranged: N/A         HH Arranged: RN,PT,OT HH Agency: [facility] Date [facility] Agency Contacted: 07/16/20 Time HH Agency Contacted: [phone] Representative spoke with at [facility] Agency: Lorenza Chick  Social Determinants of Health (SDOH) Interventions     Readmission Risk Interventions No flowsheet data found.

## 2020-07-17 ENCOUNTER — Telehealth: Payer: Self-pay | Admitting: *Deleted

## 2020-07-17 NOTE — Telephone Encounter (Signed)
Contact Date: 07/17/2020 Contacted By: Hilton Cork, LPN  Transition Care Management Follow-up Telephone Call  Date of discharge and from where:07/16/20 [facility]   Discharge Diagnosis:Syncope, Covid+  How have you been since you were released from the hospital? The same  Any questions or concerns? Niece wanted to know how often could tylenol be given   Items Reviewed:  Did the pt receive and understand the discharge instructions provided? family did, not pt d/t dementia  Medications obtained and verified? Yes   Any new allergies since your discharge? No   Dietary orders reviewed? No  Do you have support at home? Yes   Discontinued Medications Metoprolol Namzaric New Medications Added Amiodarone Amlodipine memantine methocarbamol   Current Medication List Allergies as of 07/17/2020      Reactions   Codeine Nausea And Vomiting      Medication List       Accurate as of July 17, 2020 10:41 AM. If you have any questions, ask your nurse or doctor.        acetaminophen 500 MG tablet Commonly known as: TYLENOL Take 1,000 mg by mouth every 6 (six) hours as needed for headache (pain).   AeroChamber Plus inhaler Use as instructed   albuterol 108 (90 Base) MCG/ACT inhaler Commonly known as: VENTOLIN HFA Inhale 2 puffs into the lungs every 6 (six) hours as needed for wheezing or shortness of breath.   amiodarone 200 MG tablet Commonly known as: PACERONE Take 1 tablet (200 mg total) by mouth daily.   amLODipine 5 MG tablet Commonly known as: NORVASC Take 1 tablet (5 mg total) by mouth daily.   atorvastatin 20 MG tablet Commonly known as: LIPITOR TAKE (1) TABLET DAILY AT BEDTIME. What changed: See the new instructions.   diclofenac Sodium 1 % Gel Commonly known as: VOLTAREN Apply 2 g topically 4 (four) times daily.   Eliquis 5 MG Tabs tablet Generic drug: apixaban TAKE ONE TABLET TWICE A DAY. What changed: how much to take   furosemide 20 MG  tablet Commonly known as: LASIX Take 1 tablet (20mg ) three times a week: Monday, Wednesday and Friday.   hydrALAZINE 100 MG tablet Commonly known as: APRESOLINE Take 1 tablet (100 mg total) by mouth every 8 (eight) hours.   levothyroxine 25 MCG tablet Commonly known as: SYNTHROID TAKE (1/2) TABLET DAILY. What changed:   how much to take  how to take this  when to take this  additional instructions   losartan 100 MG tablet Commonly known as: COZAAR Take 1 tablet (100 mg total) by mouth daily.   memantine 28 MG Cp24 24 hr capsule Commonly known as: NAMENDA XR Take 1 capsule (28 mg total) by mouth daily.   methocarbamol 500 MG tablet Commonly known as: ROBAXIN Take 1 tablet (500 mg total) by mouth every 8 (eight) hours as needed for muscle spasms.   potassium chloride SA 20 MEQ tablet Commonly known as: KLOR-CON Take 1 tablet (20 mEq total) by mouth daily.        Home Care and Equipment/Supplies: Were home health services ordered? yes If so, what is the name of the agency? [facility]  Has the agency set up a time to come to the patient's home? no Were any new equipment or medical supplies ordered?  No What is the name of the medical supply agency?  Were you able to get the supplies/equipment? not applicable Do you have any questions related to the use of the equipment or supplies? No  Functional  Questionnaire: (I = Independent and D = Dependent) ADLs: D  Bathing/Dressing- D  Meal Prep- D  Eating- D  Maintaining continence- D  Transferring/Ambulation- D  Managing Meds- D  Follow up appointments reviewed:   PCP Hospital f/u appt confirmed? Yes  Scheduled to see Deliah Boston on 07/25/20 at 3:50pm virtually.   Are transportation arrangements needed? No   If their condition worsens, is the pt aware to call PCP or go to the Emergency Dept.? Yes  Was the patient provided with contact information for the PCP's office or ED? Yes  Was to pt encouraged to  call back with questions or concerns? Yes

## 2020-07-25 ENCOUNTER — Telehealth (INDEPENDENT_AMBULATORY_CARE_PROVIDER_SITE_OTHER): Payer: Medicare Other | Admitting: Family Medicine

## 2020-07-25 ENCOUNTER — Encounter: Payer: Self-pay | Admitting: Family Medicine

## 2020-07-25 ENCOUNTER — Telehealth: Payer: Self-pay | Admitting: Family Medicine

## 2020-07-25 DIAGNOSIS — N1832 Chronic kidney disease, stage 3b: Secondary | ICD-10-CM | POA: Diagnosis not present

## 2020-07-25 DIAGNOSIS — I5022 Chronic systolic (congestive) heart failure: Secondary | ICD-10-CM | POA: Diagnosis not present

## 2020-07-25 DIAGNOSIS — I951 Orthostatic hypotension: Secondary | ICD-10-CM | POA: Diagnosis not present

## 2020-07-25 DIAGNOSIS — R531 Weakness: Secondary | ICD-10-CM | POA: Diagnosis not present

## 2020-07-25 DIAGNOSIS — R7989 Other specified abnormal findings of blood chemistry: Secondary | ICD-10-CM

## 2020-07-25 NOTE — Telephone Encounter (Signed)
Please call HH and have RN draw CMP and thyroid panel with TSH next week.

## 2020-07-25 NOTE — Progress Notes (Signed)
Virtual Visit via Video note  I connected with Wanda Mckenzie on 07/25/20 at 4:51 PM by video and verified that I am speaking with the correct person using two identifiers. Wanda Mckenzie is currently located at home and her son and niece are currently with her during visit. The provider, Loman Brooklyn, FNP is located in their office at time of visit.  I discussed the limitations, risks, security and privacy concerns of performing an evaluation and management service by video and the availability of in person appointments. I also discussed with the patient that there may be a patient responsible charge related to this service. The patient expressed understanding and agreed to proceed.  Subjective: PCP: Loman Brooklyn, FNP  Chief Complaint  Patient presents with  . Hospitalization Follow-up   Patient is following up from a hospital admission to [facility] from 07/12/2020 - 07/16/2020 due to syncope.  She was diagnosed with orthostatic hypotension.  EEG did not show any epileptic activity.  Echocardiogram showed worsening of her systolic function with an EF down to 35 to 40%.  Home health was recommended for PT and OT.  She was advised to schedule a follow-up with cardiology.  PT and OT have been coming out to the house.  A cardiology follow-up has not yet been scheduled.  She has had no further syncopal episodes.  Family has no concerns today.   ROS: Per HPI  Current Outpatient Medications:  .  acetaminophen (TYLENOL) 500 MG tablet, Take 1,000 mg by mouth every 6 (six) hours as needed for headache (pain)., Disp: , Rfl:  .  albuterol (VENTOLIN HFA) 108 (90 Base) MCG/ACT inhaler, Inhale 2 puffs into the lungs every 6 (six) hours as needed for wheezing or shortness of breath., Disp: 18 g, Rfl: 2 .  amiodarone (PACERONE) 200 MG tablet, Take 1 tablet (200 mg total) by mouth daily., Disp: 30 tablet, Rfl: 1 .  amLODipine (NORVASC) 5 MG tablet, Take 1 tablet (5 mg total) by mouth daily., Disp:  30 tablet, Rfl: 1 .  atorvastatin (LIPITOR) 20 MG tablet, TAKE (1) TABLET DAILY AT BEDTIME. (Patient taking differently: Take 20 mg by mouth at bedtime.), Disp: 30 tablet, Rfl: 5 .  diclofenac Sodium (VOLTAREN) 1 % GEL, Apply 2 g topically 4 (four) times daily., Disp: 50 g, Rfl: 2 .  ELIQUIS 5 MG TABS tablet, TAKE ONE TABLET TWICE A DAY. (Patient taking differently: Take 5 mg by mouth 2 (two) times daily.), Disp: 60 tablet, Rfl: 0 .  furosemide (LASIX) 20 MG tablet, Take 1 tablet (51m) three times a week: Monday, Wednesday and Friday., Disp: 30 tablet, Rfl: 0 .  hydrALAZINE (APRESOLINE) 100 MG tablet, Take 1 tablet (100 mg total) by mouth every 8 (eight) hours., Disp: 270 tablet, Rfl: 1 .  levothyroxine (SYNTHROID) 25 MCG tablet, TAKE (1/2) TABLET DAILY. (Patient taking differently: Take 12.5 mcg by mouth daily.), Disp: 45 tablet, Rfl: 1 .  losartan (COZAAR) 100 MG tablet, Take 1 tablet (100 mg total) by mouth daily., Disp: 90 tablet, Rfl: 0 .  memantine (NAMENDA XR) 28 MG CP24 24 hr capsule, Take 1 capsule (28 mg total) by mouth daily., Disp: 30 capsule, Rfl: 1 .  methocarbamol (ROBAXIN) 500 MG tablet, Take 1 tablet (500 mg total) by mouth every 8 (eight) hours as needed for muscle spasms., Disp: 15 tablet, Rfl: 0 .  potassium chloride SA (KLOR-CON) 20 MEQ tablet, Take 1 tablet (20 mEq total) by mouth daily., Disp: 30 tablet,  Rfl: 1 .  Spacer/Aero-Holding Chambers (AEROCHAMBER PLUS) inhaler, Use as instructed, Disp: 1 each, Rfl: 2  Allergies  Allergen Reactions  . Codeine Nausea And Vomiting   Past Medical History:  Diagnosis Date  . Atrial fibrillation ([place])   . COPD (chronic obstructive pulmonary disease) ([place])   . Dyslipidemia   . Essential hypertension   . History of cardiac catheterization 11/2013   Normal coronaries  . History of stroke    Right MCA distribution     Observations/Objective: Physical Exam Constitutional:      General: She is not in acute distress.     Appearance: Normal appearance. She is not ill-appearing or toxic-appearing.  Eyes:     General: No scleral icterus.       Right eye: No discharge.        Left eye: No discharge.     Conjunctiva/sclera: Conjunctivae normal.  Pulmonary:     Effort: Pulmonary effort is normal. No respiratory distress.  Neurological:     Mental Status: She is alert.  Psychiatric:        Behavior: Behavior normal.    Assessment and Plan: 1. Orthostatic hypotension - Resolved. - CMP14+EGFR  2. Chronic systolic CHF (congestive heart failure) (HCC) - Advised to schedule f/u with cardiology. - CMP14+EGFR  3. Generalized weakness - Continue working with PT and OT.  4. Stage 3b chronic kidney disease ([place]) - F/U scheduled for 09/06/2020 with Dr. Theador Hawthorne. - CMP14+EGFR  5. Elevated TSH - Thyroid Panel With TSH   Requesting RN with home health draw labs.  Follow Up Instructions: Return in about 3 months (around 10/22/2020) for follow-up of chronic medication conditions.   I discussed the assessment and treatment plan with the patient. The patient was provided an opportunity to ask questions and all were answered. The patient agreed with the plan and demonstrated an understanding of the instructions.   The patient was advised to call back or seek an in-person evaluation if the symptoms worsen or if the condition fails to improve as anticipated.  The above assessment and management plan was discussed with the patient. The patient verbalized understanding of and has agreed to the management plan. Patient is aware to call the clinic if symptoms persist or worsen. Patient is aware when to return to the clinic for a follow-up visit. Patient educated on when it is appropriate to go to the emergency department.   Time call ended: 5:03 PM  I provided 14 minutes of face-to-face time during this encounter.   Hendricks Limes, MSN, APRN, FNP-C [facility] 07/25/20

## 2020-07-26 ENCOUNTER — Other Ambulatory Visit: Payer: Self-pay | Admitting: Nurse Practitioner

## 2020-07-26 DIAGNOSIS — Z8673 Personal history of transient ischemic attack (TIA), and cerebral infarction without residual deficits: Secondary | ICD-10-CM

## 2020-07-26 DIAGNOSIS — I82402 Acute embolism and thrombosis of unspecified deep veins of left lower extremity: Secondary | ICD-10-CM

## 2020-07-26 NOTE — Telephone Encounter (Signed)
Spoke with [facility] at [phone] and they state that she does not have nursing so it would be in the patients best interest to have the labs done in office.

## 2020-07-27 NOTE — Telephone Encounter (Signed)
I believe the family has a hard time getting the patient out of the house which is why she has home health and we did a virtual visit. Can we please get someone to draw labs in the home?

## 2020-07-27 NOTE — Telephone Encounter (Signed)
Contacted Melissa with [employer].  Melissa states that patient does not have nursing and they are unable to send a nurse out for a one time blood draw

## 2020-07-30 NOTE — Telephone Encounter (Signed)
Please make family aware they are going to have to bring her in for lab work.  They do not need an appointment to do this.

## 2020-07-30 NOTE — Telephone Encounter (Signed)
Family aware and verbalizes understanding.

## 2020-08-07 ENCOUNTER — Ambulatory Visit (INDEPENDENT_AMBULATORY_CARE_PROVIDER_SITE_OTHER): Payer: Medicare Other

## 2020-08-07 ENCOUNTER — Other Ambulatory Visit: Payer: Self-pay

## 2020-08-07 DIAGNOSIS — I130 Hypertensive heart and chronic kidney disease with heart failure and stage 1 through stage 4 chronic kidney disease, or unspecified chronic kidney disease: Secondary | ICD-10-CM | POA: Diagnosis not present

## 2020-08-07 DIAGNOSIS — J449 Chronic obstructive pulmonary disease, unspecified: Secondary | ICD-10-CM

## 2020-08-07 DIAGNOSIS — I5022 Chronic systolic (congestive) heart failure: Secondary | ICD-10-CM

## 2020-08-07 DIAGNOSIS — Z8616 Personal history of COVID-19: Secondary | ICD-10-CM

## 2020-08-07 DIAGNOSIS — Z7901 Long term (current) use of anticoagulants: Secondary | ICD-10-CM

## 2020-08-07 DIAGNOSIS — Z9181 History of falling: Secondary | ICD-10-CM

## 2020-08-07 DIAGNOSIS — Z8673 Personal history of transient ischemic attack (TIA), and cerebral infarction without residual deficits: Secondary | ICD-10-CM

## 2020-08-07 DIAGNOSIS — N1832 Chronic kidney disease, stage 3b: Secondary | ICD-10-CM

## 2020-08-07 DIAGNOSIS — I482 Chronic atrial fibrillation, unspecified: Secondary | ICD-10-CM

## 2020-08-07 DIAGNOSIS — F0280 Dementia in other diseases classified elsewhere without behavioral disturbance: Secondary | ICD-10-CM

## 2020-08-07 DIAGNOSIS — E782 Mixed hyperlipidemia: Secondary | ICD-10-CM

## 2020-08-07 DIAGNOSIS — G309 Alzheimer's disease, unspecified: Secondary | ICD-10-CM

## 2020-08-07 DIAGNOSIS — I700 Atherosclerosis of aorta: Secondary | ICD-10-CM

## 2020-08-07 DIAGNOSIS — Z96642 Presence of left artificial hip joint: Secondary | ICD-10-CM

## 2020-08-07 DIAGNOSIS — F0150 Vascular dementia without behavioral disturbance: Secondary | ICD-10-CM

## 2020-08-15 ENCOUNTER — Other Ambulatory Visit: Payer: Self-pay | Admitting: Family Medicine

## 2020-08-15 DIAGNOSIS — I509 Heart failure, unspecified: Secondary | ICD-10-CM

## 2020-08-30 ENCOUNTER — Encounter: Payer: Self-pay | Admitting: Family Medicine

## 2020-08-30 ENCOUNTER — Ambulatory Visit: Payer: Medicare HMO | Admitting: Family Medicine

## 2020-09-06 ENCOUNTER — Other Ambulatory Visit: Payer: Self-pay | Admitting: Family Medicine

## 2020-09-19 ENCOUNTER — Other Ambulatory Visit: Payer: Self-pay | Admitting: Family Medicine

## 2020-09-19 DIAGNOSIS — R7989 Other specified abnormal findings of blood chemistry: Secondary | ICD-10-CM

## 2020-09-20 ENCOUNTER — Other Ambulatory Visit: Payer: Self-pay | Admitting: Family Medicine

## 2020-09-20 DIAGNOSIS — I509 Heart failure, unspecified: Secondary | ICD-10-CM

## 2020-09-21 IMAGING — MR MR HEAD W/O CM
10 of 13 series · 30 of 48 positions shown · IV contrast (agent unspecified)
Comparison: CT head 05/14/2019. Remote MR head 12/03/2005

CLINICAL DATA: 82-year-old female with dementia and atrial
fibrillation presenting with altered mental status.

EXAM:
MRI HEAD WITHOUT CONTRAST
MRA NECK WITHOUT CONTRAST
TECHNIQUE: Multiplanar, multiecho pulse sequences of the brain and surrounding
structures were obtained without intravenous contrast. Angiographic
images of the neck were obtained using MRA technique with and
without intravenous contrast. Carotid stenosis measurements (when
applicable) are obtained utilizing NASCET criteria, using the distal
internal carotid diameter as the denominator.
CONTRAST:  None.

[Series 2: DWI · axial · 3.0mm · 0.94mm/px · z∈[-97,+54]mm · 7 of 104 slices shown (1 of 2)]
[im 1/104]
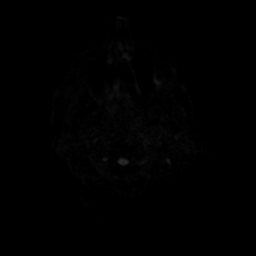
[im 18/104]
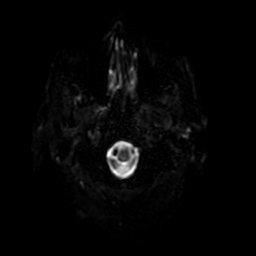
[im 35/104]
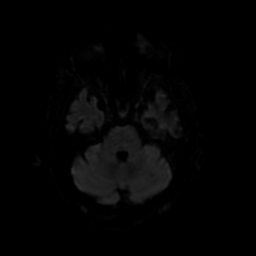
[im 52/104]
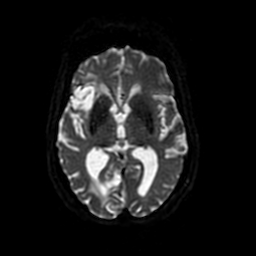
[im 69/104]
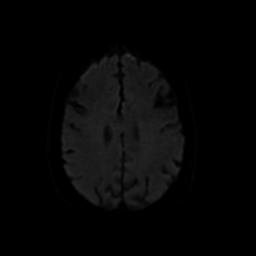
[im 86/104]
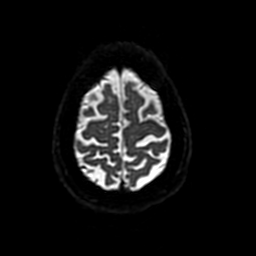
[im 104/104]
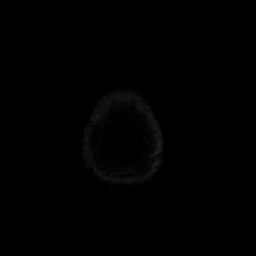

[Series 3: DWI · coronal · 4.0mm · 0.94mm/px · 5 of 72 slices shown (2 of 2)]
[im 1/72]
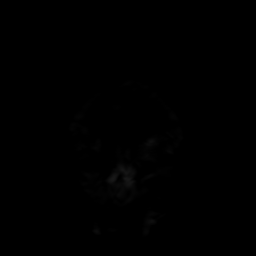
[im 18/72]
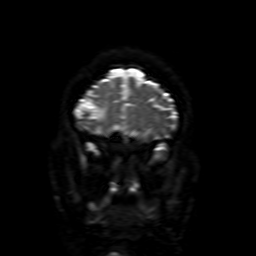
[im 36/72]
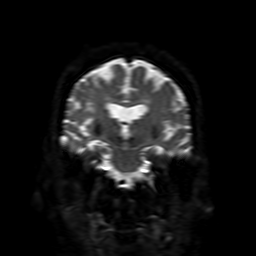
[im 54/72]
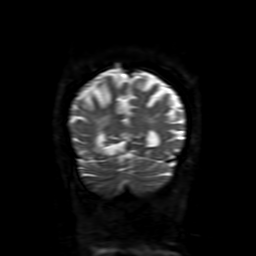
[im 72/72]
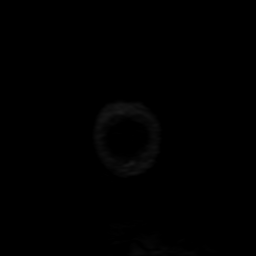

[Series 5: FLAIR · axial · 3.0mm · 0.45mm/px · 1 of 25 slices shown (1 of 2)]
[im 1/25]
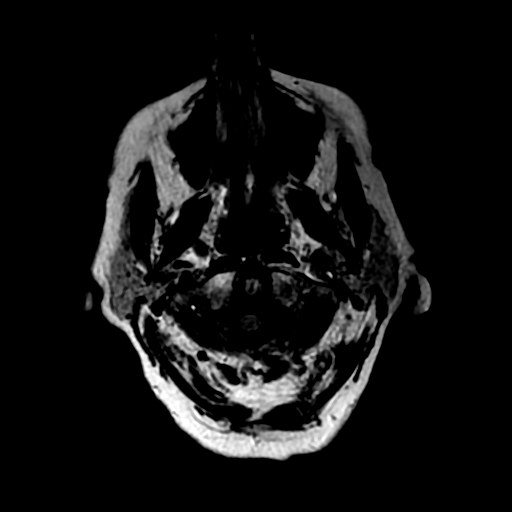

[Series 8: FLAIR · sagittal · 5.0mm · 0.47mm/px · 1 of 23 slices shown (2 of 2)]
[im 1/23]
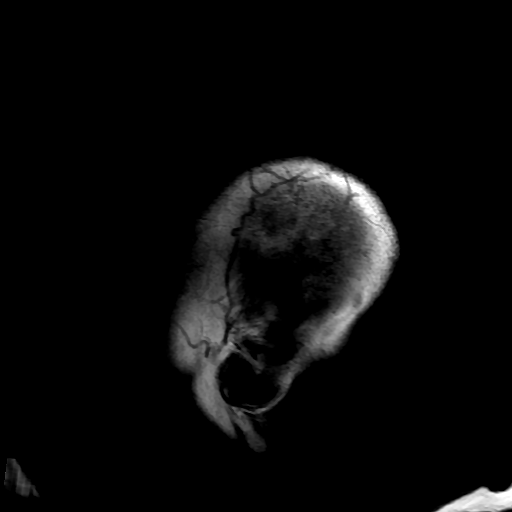

[Series 9: SWI · axial · 3.0mm · 0.47mm/px · z∈[-97,+55]mm · 6 of 104 slices shown (1 of 2)]
[im 1/104]
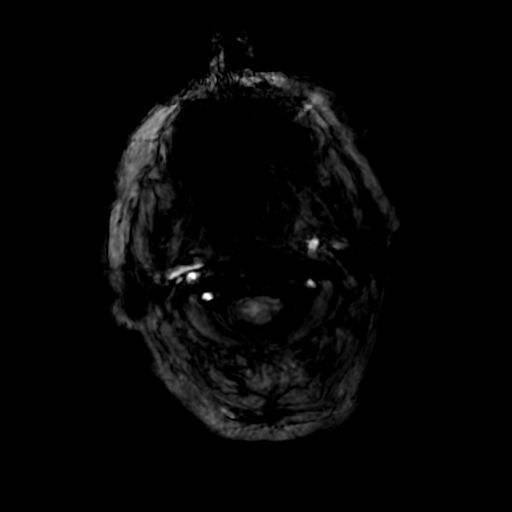
[im 21/104]
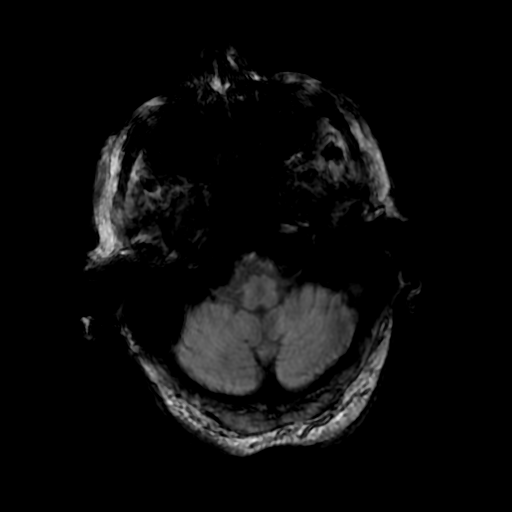
[im 42/104]
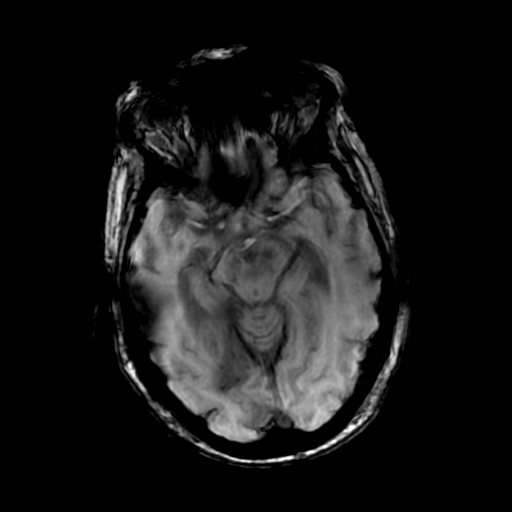
[im 62/104]
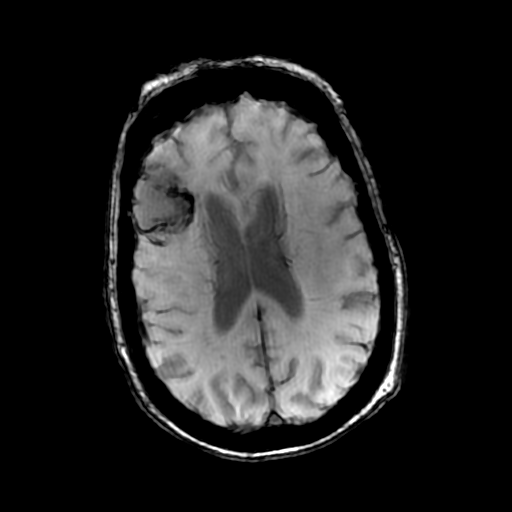
[im 83/104]
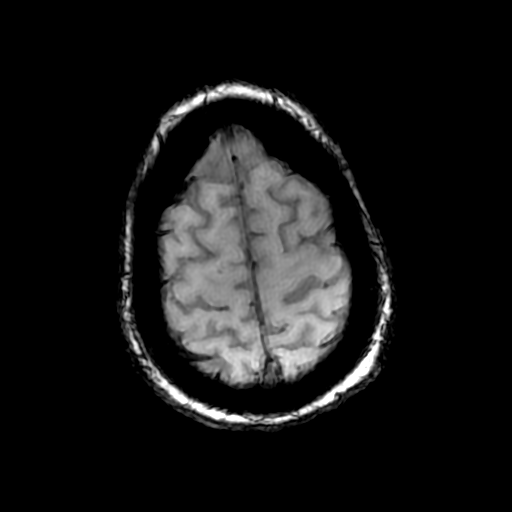
[im 104/104]
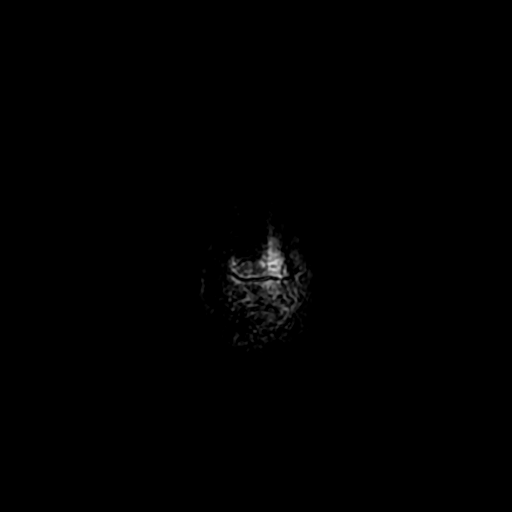

[Series 10: T2 · axial · 5.0mm · 0.45mm/px · 1 of 25 slices shown (1 of 2)]
[im 1/25]
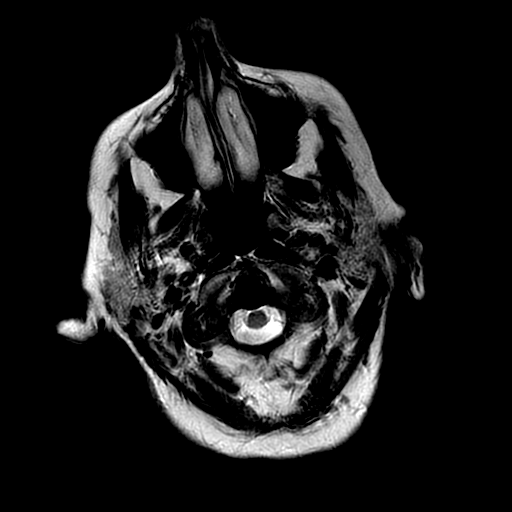

[Series 12: T2 · coronal · 5.0mm · 0.39mm/px · 2 of 30 slices shown (2 of 2)]
[im 1/30]
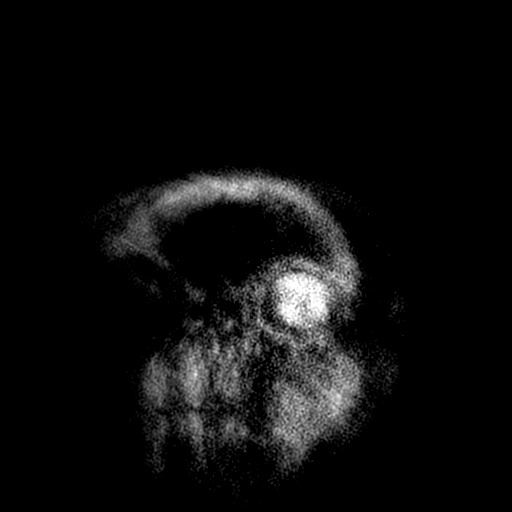
[im 30/30]
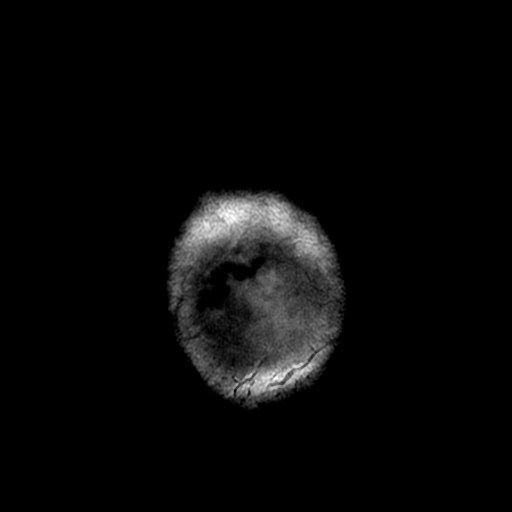

[Series 250: ADC · axial · 3.0mm · 0.94mm/px · z∈[-97,+54]mm · 3 of 52 slices shown (1 of 2)]
[im 1/52]
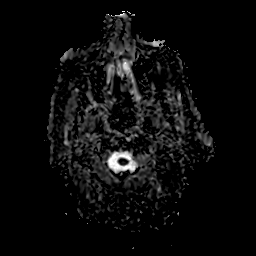
[im 26/52]
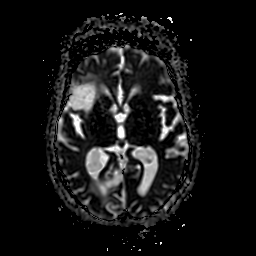
[im 52/52]
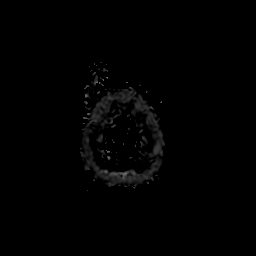

[Series 350: ADC · coronal · 4.0mm · 0.94mm/px · 2 of 36 slices shown (2 of 2)]
[im 1/36]
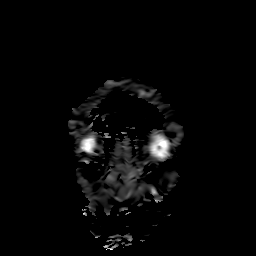
[im 36/36]
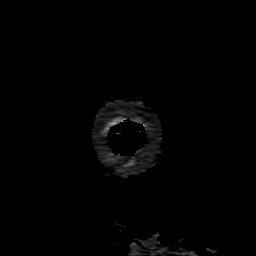

[Series 900: SWI · axial · 3.0mm · 0.47mm/px · z∈[-97,-67]mm · 2 of 104 slices shown (2 of 2)]
[im 1/104]
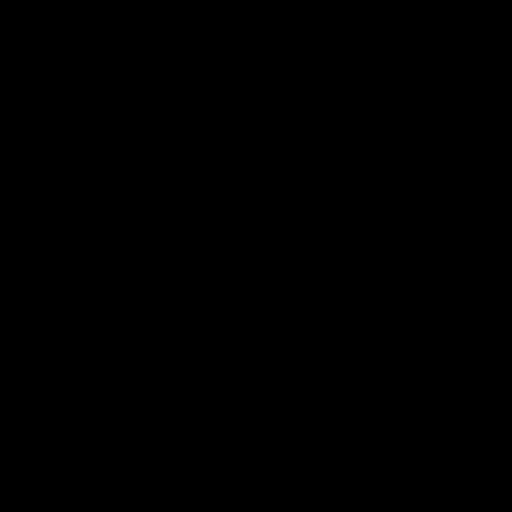
[im 21/104]
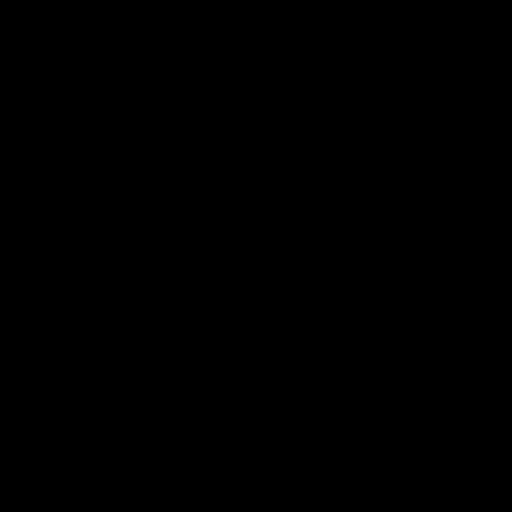

[30 of 48 positions shown; findings below may reference images not displayed]

FINDINGS: MRI HEAD FINDINGS

Patient was uncooperative. Some series are degraded by motion or
could not can be completed.

Brain: No evidence for acute infarction, hemorrhage, mass lesion,
hydrocephalus, or extra-axial fluid. Generalized atrophy. Remote
RIGHT frontal infarct/hematoma, with chronic blood products layering
the encephalomalacia cavity. Also remote RIGHT occipital infarct is
noted. Both these abnormalities have been present since 6448. T2 and
FLAIR hyperintensities in the white matter favored to represent
chronic microvascular ischemic change.

Vascular: Flow voids are maintained.

Skull and upper cervical spine: Normal marrow signal. Mild pannus.

Sinuses/Orbits: No acute findings.

Other: None.

MRA NECK FINDINGS

Noncontrast examination was performed. Conventional branching of the
great vessels from the arch without visible proximal stenosis.
Carotid bifurcations demonstrate no flow reducing lesion. Posterior
wall plaque at the LEFT carotid bifurcation is suggested, without
findings to suggest greater than 50% stenosis. BILATERAL vertebral
arteries are patent, RIGHT dominant.
IMPRESSION: 1. No acute intracranial findings. Atrophy and small vessel disease.
Remote ischemic changes as described.
2. No extracranial or intracranial flow reducing lesion is evident.

## 2020-09-25 ENCOUNTER — Other Ambulatory Visit: Payer: Self-pay | Admitting: Family

## 2020-09-25 ENCOUNTER — Other Ambulatory Visit: Payer: Self-pay | Admitting: Nurse Practitioner

## 2020-09-25 ENCOUNTER — Other Ambulatory Visit: Payer: Self-pay | Admitting: Family Medicine

## 2020-09-25 DIAGNOSIS — Z8673 Personal history of transient ischemic attack (TIA), and cerebral infarction without residual deficits: Secondary | ICD-10-CM

## 2020-09-25 DIAGNOSIS — I509 Heart failure, unspecified: Secondary | ICD-10-CM

## 2020-09-25 DIAGNOSIS — I10 Essential (primary) hypertension: Secondary | ICD-10-CM

## 2020-09-25 DIAGNOSIS — I82402 Acute embolism and thrombosis of unspecified deep veins of left lower extremity: Secondary | ICD-10-CM

## 2020-10-02 ENCOUNTER — Emergency Department (HOSPITAL_COMMUNITY): Payer: Medicare Other

## 2020-10-02 ENCOUNTER — Encounter (HOSPITAL_COMMUNITY): Payer: Self-pay

## 2020-10-02 ENCOUNTER — Inpatient Hospital Stay (HOSPITAL_COMMUNITY)
Admission: EM | Admit: 2020-10-02 | Discharge: 2020-10-06 | DRG: 312 | Disposition: A | Discharge status: Home Health | Payer: Medicare Other | Attending: Internal Medicine | Admitting: Internal Medicine

## 2020-10-02 ENCOUNTER — Other Ambulatory Visit: Payer: Self-pay

## 2020-10-02 DIAGNOSIS — F0150 Vascular dementia without behavioral disturbance: Secondary | ICD-10-CM | POA: Diagnosis present

## 2020-10-02 DIAGNOSIS — I4892 Unspecified atrial flutter: Secondary | ICD-10-CM | POA: Diagnosis present

## 2020-10-02 DIAGNOSIS — I5042 Chronic combined systolic (congestive) and diastolic (congestive) heart failure: Secondary | ICD-10-CM | POA: Diagnosis present

## 2020-10-02 DIAGNOSIS — Z79899 Other long term (current) drug therapy: Secondary | ICD-10-CM | POA: Diagnosis not present

## 2020-10-02 DIAGNOSIS — Z8249 Family history of ischemic heart disease and other diseases of the circulatory system: Secondary | ICD-10-CM | POA: Diagnosis not present

## 2020-10-02 DIAGNOSIS — N179 Acute kidney failure, unspecified: Secondary | ICD-10-CM | POA: Diagnosis present

## 2020-10-02 DIAGNOSIS — Z823 Family history of stroke: Secondary | ICD-10-CM | POA: Diagnosis not present

## 2020-10-02 DIAGNOSIS — Z7901 Long term (current) use of anticoagulants: Secondary | ICD-10-CM

## 2020-10-02 DIAGNOSIS — I951 Orthostatic hypotension: Principal | ICD-10-CM | POA: Diagnosis present

## 2020-10-02 DIAGNOSIS — R1111 Vomiting without nausea: Secondary | ICD-10-CM

## 2020-10-02 DIAGNOSIS — E785 Hyperlipidemia, unspecified: Secondary | ICD-10-CM | POA: Diagnosis present

## 2020-10-02 DIAGNOSIS — I10 Essential (primary) hypertension: Secondary | ICD-10-CM | POA: Diagnosis not present

## 2020-10-02 DIAGNOSIS — Z79890 Hormone replacement therapy (postmenopausal): Secondary | ICD-10-CM

## 2020-10-02 DIAGNOSIS — E039 Hypothyroidism, unspecified: Secondary | ICD-10-CM | POA: Diagnosis present

## 2020-10-02 DIAGNOSIS — I130 Hypertensive heart and chronic kidney disease with heart failure and stage 1 through stage 4 chronic kidney disease, or unspecified chronic kidney disease: Secondary | ICD-10-CM | POA: Diagnosis present

## 2020-10-02 DIAGNOSIS — I493 Ventricular premature depolarization: Secondary | ICD-10-CM | POA: Diagnosis present

## 2020-10-02 DIAGNOSIS — J449 Chronic obstructive pulmonary disease, unspecified: Secondary | ICD-10-CM | POA: Diagnosis present

## 2020-10-02 DIAGNOSIS — Z90710 Acquired absence of both cervix and uterus: Secondary | ICD-10-CM | POA: Diagnosis not present

## 2020-10-02 DIAGNOSIS — Z885 Allergy status to narcotic agent status: Secondary | ICD-10-CM | POA: Diagnosis not present

## 2020-10-02 DIAGNOSIS — N1832 Chronic kidney disease, stage 3b: Secondary | ICD-10-CM | POA: Diagnosis present

## 2020-10-02 DIAGNOSIS — I4821 Permanent atrial fibrillation: Secondary | ICD-10-CM | POA: Diagnosis present

## 2020-10-02 DIAGNOSIS — I428 Other cardiomyopathies: Secondary | ICD-10-CM | POA: Diagnosis present

## 2020-10-02 DIAGNOSIS — Z96642 Presence of left artificial hip joint: Secondary | ICD-10-CM | POA: Diagnosis present

## 2020-10-02 DIAGNOSIS — R55 Syncope and collapse: Secondary | ICD-10-CM | POA: Diagnosis not present

## 2020-10-02 DIAGNOSIS — Z66 Do not resuscitate: Secondary | ICD-10-CM | POA: Diagnosis present

## 2020-10-02 DIAGNOSIS — Z8673 Personal history of transient ischemic attack (TIA), and cerebral infarction without residual deficits: Secondary | ICD-10-CM | POA: Diagnosis not present

## 2020-10-02 LAB — CBC WITH DIFFERENTIAL/PLATELET
Abs Immature Granulocytes: 0.02 10*3/uL (ref 0.00–0.07)
Basophils Absolute: 0.1 10*3/uL (ref 0.0–0.1)
Basophils Relative: 1 %
Eosinophils Absolute: 0.2 10*3/uL (ref 0.0–0.5)
Eosinophils Relative: 2 %
HCT: 40.5 % (ref 36.0–46.0)
Hemoglobin: 13.4 g/dL (ref 12.0–15.0)
Immature Granulocytes: 0 %
Lymphocytes Relative: 22 %
Lymphs Abs: 1.6 10*3/uL (ref 0.7–4.0)
MCH: 32.6 pg (ref 26.0–34.0)
MCHC: 33.1 g/dL (ref 30.0–36.0)
MCV: 98.5 fL (ref 80.0–100.0)
Monocytes Absolute: 0.5 10*3/uL (ref 0.1–1.0)
Monocytes Relative: 7 %
Neutro Abs: 4.7 10*3/uL (ref 1.7–7.7)
Neutrophils Relative %: 68 %
Platelets: 261 10*3/uL (ref 150–400)
RBC: 4.11 MIL/uL (ref 3.87–5.11)
RDW: 13.3 % (ref 11.5–15.5)
WBC: 7 10*3/uL (ref 4.0–10.5)
nRBC: 0 % (ref 0.0–0.2)

## 2020-10-02 LAB — COMPREHENSIVE METABOLIC PANEL
ALT: 14 U/L (ref 0–44)
AST: 28 U/L (ref 15–41)
Albumin: 3.5 g/dL (ref 3.5–5.0)
Alkaline Phosphatase: 58 U/L (ref 38–126)
Anion gap: 8 (ref 5–15)
BUN: 15 mg/dL (ref 8–23)
CO2: 23 mmol/L (ref 22–32)
Calcium: 8.7 mg/dL — ABNORMAL LOW (ref 8.9–10.3)
Chloride: 109 mmol/L (ref 98–111)
Creatinine, Ser: 1.31 mg/dL — ABNORMAL HIGH (ref 0.44–1.00)
GFR, Estimated: 41 mL/min — ABNORMAL LOW (ref >60)
Glucose, Bld: 103 mg/dL — ABNORMAL HIGH (ref 70–99)
Potassium: 3.9 mmol/L (ref 3.5–5.1)
Sodium: 140 mmol/L (ref 135–145)
Total Bilirubin: 0.9 mg/dL (ref 0.3–1.2)
Total Protein: 5.9 g/dL — ABNORMAL LOW (ref 6.5–8.1)

## 2020-10-02 LAB — URINALYSIS, COMPLETE (UACMP) WITH MICROSCOPIC
Bilirubin Urine: NEGATIVE
Glucose, UA: NEGATIVE mg/dL
Ketones, ur: 15 mg/dL — AB
Leukocytes,Ua: NEGATIVE
Nitrite: NEGATIVE
Protein, ur: NEGATIVE mg/dL
RBC / HPF: >50 RBC/hpf (ref 0–5)
Specific Gravity, Urine: >1.03 — ABNORMAL HIGH (ref 1.005–1.030)
pH: 6 (ref 5.0–8.0)

## 2020-10-02 LAB — MAGNESIUM: Magnesium: 2 mg/dL (ref 1.7–2.4)

## 2020-10-02 LAB — CBG MONITORING, ED: Glucose-Capillary: 93 mg/dL (ref 70–99)

## 2020-10-02 LAB — TROPONIN I (HIGH SENSITIVITY)
Troponin I (High Sensitivity): 14 ng/L (ref <18)
Troponin I (High Sensitivity): 15 ng/L (ref <18)

## 2020-10-02 LAB — PHOSPHORUS: Phosphorus: 3.3 mg/dL (ref 2.5–4.6)

## 2020-10-02 MED ORDER — POTASSIUM CHLORIDE CRYS ER 20 MEQ PO TBCR
20.0000 meq | EXTENDED_RELEASE_TABLET | Freq: Every day | ORAL | Status: DC
  Administered 2020-10-02: 20 meq via ORAL
  Filled 2020-10-02: qty 1

## 2020-10-02 MED ORDER — ONDANSETRON HCL 4 MG/2ML IJ SOLN: 4.0000 mg | Freq: Four times a day (QID) | INTRAMUSCULAR | Status: DC | PRN

## 2020-10-02 MED ORDER — ACETAMINOPHEN 650 MG RE SUPP: 650.0000 mg | Freq: Four times a day (QID) | RECTAL | Status: DC | PRN

## 2020-10-02 MED ORDER — HYDRALAZINE HCL 25 MG PO TABS
50.0000 mg | ORAL_TABLET | Freq: Four times a day (QID) | ORAL | Status: DC | PRN
  Administered 2020-10-03: 50 mg via ORAL
  Filled 2020-10-02: qty 2

## 2020-10-02 MED ORDER — HYDRALAZINE HCL 25 MG PO TABS
100.0000 mg | ORAL_TABLET | Freq: Three times a day (TID) | ORAL | Status: DC
  Administered 2020-10-02 – 2020-10-04 (×7): 100 mg via ORAL
  Filled 2020-10-02 (×8): qty 4

## 2020-10-02 MED ORDER — SODIUM CHLORIDE 0.9% FLUSH
3.0000 mL | Freq: Two times a day (BID) | INTRAVENOUS | Status: DC
  Administered 2020-10-02 – 2020-10-06 (×5): 3 mL via INTRAVENOUS

## 2020-10-02 MED ORDER — ONDANSETRON HCL 4 MG/2ML IJ SOLN
4.0000 mg | Freq: Once | INTRAMUSCULAR | Status: AC
  Administered 2020-10-02: 4 mg via INTRAVENOUS
  Filled 2020-10-02: qty 2

## 2020-10-02 MED ORDER — ACETAMINOPHEN 500 MG PO TABS: 1000.0000 mg | ORAL_TABLET | Freq: Four times a day (QID) | ORAL | Status: DC | PRN

## 2020-10-02 MED ORDER — DICLOFENAC SODIUM 1 % EX GEL
2.0000 g | Freq: Four times a day (QID) | CUTANEOUS | Status: DC
  Administered 2020-10-06 (×2): 2 g via TOPICAL
  Filled 2020-10-02 (×2): qty 100

## 2020-10-02 MED ORDER — ATORVASTATIN CALCIUM 10 MG PO TABS
20.0000 mg | ORAL_TABLET | Freq: Every day | ORAL | Status: DC
  Administered 2020-10-02 – 2020-10-05 (×4): 20 mg via ORAL
  Filled 2020-10-02 (×4): qty 2

## 2020-10-02 MED ORDER — AMIODARONE HCL 200 MG PO TABS
200.0000 mg | ORAL_TABLET | Freq: Once | ORAL | Status: AC
  Administered 2020-10-02: 200 mg via ORAL
  Filled 2020-10-02: qty 1

## 2020-10-02 MED ORDER — ACETAMINOPHEN 325 MG PO TABS: 650.0000 mg | ORAL_TABLET | Freq: Four times a day (QID) | ORAL | Status: DC | PRN

## 2020-10-02 MED ORDER — SODIUM CHLORIDE 0.9 % IV BOLUS
500.0000 mL | Freq: Once | INTRAVENOUS | Status: AC
  Administered 2020-10-02: 500 mL via INTRAVENOUS

## 2020-10-02 MED ORDER — ONDANSETRON HCL 4 MG PO TABS: 4.0000 mg | ORAL_TABLET | Freq: Four times a day (QID) | ORAL | Status: DC | PRN

## 2020-10-02 MED ORDER — APIXABAN 5 MG PO TABS
5.0000 mg | ORAL_TABLET | Freq: Two times a day (BID) | ORAL | Status: DC
  Administered 2020-10-02 – 2020-10-05 (×7): 5 mg via ORAL
  Filled 2020-10-02 (×8): qty 1

## 2020-10-02 MED ORDER — MEMANTINE HCL ER 28 MG PO CP24
28.0000 mg | ORAL_CAPSULE | Freq: Every day | ORAL | Status: DC
  Administered 2020-10-03 – 2020-10-05 (×3): 28 mg via ORAL
  Filled 2020-10-02 (×4): qty 1

## 2020-10-02 MED ORDER — LEVOTHYROXINE SODIUM 25 MCG PO TABS
25.0000 ug | ORAL_TABLET | Freq: Every day | ORAL | Status: DC
  Administered 2020-10-03 – 2020-10-06 (×4): 25 ug via ORAL
  Filled 2020-10-02 (×4): qty 1

## 2020-10-02 MED ORDER — METHOCARBAMOL 500 MG PO TABS: 500.0000 mg | ORAL_TABLET | Freq: Three times a day (TID) | ORAL | Status: DC | PRN

## 2020-10-02 MED ORDER — AMIODARONE HCL 200 MG PO TABS
200.0000 mg | ORAL_TABLET | Freq: Every day | ORAL | Status: DC
  Administered 2020-10-03 – 2020-10-05 (×3): 200 mg via ORAL
  Filled 2020-10-02 (×4): qty 1

## 2020-10-02 MED ORDER — IPRATROPIUM BROMIDE 0.02 % IN SOLN: 2.5000 mL | Freq: Four times a day (QID) | RESPIRATORY_TRACT | Status: DC | PRN

## 2020-10-02 NOTE — Consult Note (Addendum)
Cardiology Consultation:   Patient ID: Wanda Mckenzie MRN: [id number]; DOB: 14-Dec-1935  Admit date: 10/02/2020 Date of Consult: 10/02/2020  PCP:  [name], FNP   [facility]  Cardiologist:  Nona Dell, MD  Advanced Practice Provider:  No care team member to display Electrophysiologist:  None   Patient Profile:   Wanda Mckenzie is a 85 y.o. female with a PMH of permanent atrial fibrillation, chronic combined CHF/NICM, HTN, HLD, COPD, CVA, DVT on eliquis, hypothyroidism, vascular dementia who is being seen today for the evaluation of syncope at the request of Dr. Jacqulyn Bath.  History of Present Illness:   Wanda Mckenzie was last evaluated by cardiology at an outpatient visit with Nena Polio, NP 05/2019, at which time she was doing fine from a cardiac standpoint without anginal complaints. Her metoprolol was transitioned to succinate 25mg  daily given cardiomyopathy, otherwise no medication changes occurred. She was diagnosed with NICM 04/2019 with EF 40-45%. Prior to this she underwent a NST in 2015 which was suggestive of multivessel disease prompting her to undergo a LHC in 2015 which showed normal coronary arteries.   She was hospitalized 06/2020 after presenting with syncope. She was found to have orthostatic hypotension which improved with hydration. Echo that admission showed drop in EF to 35-40%, indeterminate LV diastolic function, normal RV size/function, mild MR, and moderate AI. She was noted to have several episodes of NSVT which were reported to be asymptomatic, though with patient's dementia, accuracy of symptoms was questioned. She had atrial fibrillation with SVR limiting titration of BBlocker (ultimately discontinued that admission). Hospitalist discussed with Dr. 07/2020 and decision made to start amiodarone as patient was felt to be a poor candidate for intervention.   She was in her usual state of health until this morning when she experienced a  syncopal episode while sitting at the table shortly after having a bowel movement. Niece noticed she was slumped over the table, not responding, clammy/cold sweat. She was reported to have LOC for <1 minute. Niece took BP and reported SBP in the 70s and O2 in the upper 80s. EMS was activated and patient was brought to the ED for further evaluation.   At the time of this evaluation she is resting comfortably in bed. She is pleasantly demented and oriented to first name only. She has no complaints of chest pain or SOB at this time. She does not recall the events that brought her to the hospital.  Hospital course: initially normotensive, now hypertensive, intermittently bradycardic and tachycardic, otherwise VSS. Labs notable for K 3.9, Cr 1.31 (baseline 0.8-1.1), CBC wnl, HsTrop 14. CXR showed stable cardiomegaly; no acute findings. EKG showed atrial fibrillation, rate 67 bpm, non-specific IVCD, no significant change from previous. CT head with prior frontal/occipital infarcts; no acute findings. She was given IVFs in the ED and admitted to medicine. Cardiology asked to evaluate.     Past Medical History:  Diagnosis Date  . Atrial fibrillation (HCC)   . COPD (chronic obstructive pulmonary disease) (HCC)   . Dyslipidemia   . Essential hypertension   . History of cardiac catheterization 11/2013   Normal coronaries  . History of stroke    Right MCA distribution     Past Surgical History:  Procedure Laterality Date  . ABDOMINAL HYSTERECTOMY  1975  . FRACTURE SURGERY Left   . LEFT HEART CATHETERIZATION WITH CORONARY ANGIOGRAM N/A 11/23/2013   Procedure: LEFT HEART CATHETERIZATION WITH CORONARY ANGIOGRAM;  Surgeon: 01/23/2014, MD;  Location: [facility] CATH LAB;  Service: Cardiovascular;  Laterality: N/A;  . LEFT HIP HEMIARTHROPLASTY    . TRIBUTARY VARICOSITIES OF RIGHT LEG  05/23/2002  . VEIN LIGATION AND STRIPPING Right 02/08/2013   Procedure: Segmental excision of painful varicose veins right  lower extremity;  Surgeon: Chuck Hint, MD;  Location: [facility] OR;  Service: Vascular;  Laterality: Right;     Home Medications:  Prior to Admission medications   Medication Sig Start Date End Date Taking? Authorizing Provider  acetaminophen (TYLENOL) 500 MG tablet Take 1,000 mg by mouth every 6 (six) hours as needed for headache (pain).    [provider]  albuterol (VENTOLIN HFA) 108 (90 Base) MCG/ACT inhaler Inhale 2 puffs into the lungs every 6 (six) hours as needed for wheezing or shortness of breath. 01/10/19   Deliah Boston F, FNP  amiodarone (PACERONE) 200 MG tablet TAKE 1 TABLET ONCE DAILY. 09/07/20   [name], FNP  amLODipine (NORVASC) 5 MG tablet Take 1 tablet (5 mg total) by mouth daily. 09/07/20   [name], FNP  atorvastatin (LIPITOR) 20 MG tablet TAKE (1) TABLET DAILY AT BEDTIME. Patient taking differently: Take 20 mg by mouth at bedtime. 06/18/20   [name], FNP  diclofenac Sodium (VOLTAREN) 1 % GEL Apply 2 g topically 4 (four) times daily. 06/05/20   Daryll Drown, NP  ELIQUIS 5 MG TABS tablet TAKE ONE TABLET TWICE A DAY. 09/25/20   [name], FNP  furosemide (LASIX) 20 MG tablet TAKE 1 TAB. 3 TIMES A WEEK; MONDAY, WEDNESDAY & FRIDAY. 09/20/20   [name], FNP  hydrALAZINE (APRESOLINE) 100 MG tablet TAKE (1) TABLET EVERY EIGHT HOURS. 08/15/20   Deliah Boston F, FNP  levothyroxine (SYNTHROID) 25 MCG tablet TAKE (1/2) TABLET DAILY. (NEEDS LABWORK) 09/19/20   [name], FNP  losartan (COZAAR) 100 MG tablet TAKE 1 TABLET DAILY. 09/25/20   [name], FNP  memantine (NAMENDA XR) 28 MG CP24 24 hr capsule TAKE (1) CAPSULE DAILY. 09/25/20   [name], FNP  methocarbamol (ROBAXIN) 500 MG tablet Take 1 tablet (500 mg total) by mouth every 8 (eight) hours as needed for muscle spasms. 07/16/20   Osvaldo Shipper, MD  potassium chloride SA (KLOR-CON) 20 MEQ tablet Take 1 tablet (20 mEq total) by mouth daily. 07/16/20   Osvaldo Shipper, MD  Spacer/Aero-Holding Chambers (AEROCHAMBER PLUS) inhaler Use as instructed 01/10/19   [name], FNP    Inpatient Medications: Scheduled Meds:  Continuous Infusions:  PRN Meds:   Allergies:    Allergies  Allergen Reactions  . Codeine Nausea And Vomiting    Social History:   Social History   Socioeconomic History  . Marital status: Married    Spouse name: Not on file  . Number of children: Not on file  . Years of education: Not on file  . Highest education level: Not on file  Occupational History  . Not on file  Tobacco Use  . Smoking status: Never Smoker  . Smokeless tobacco: Never Used  Vaping Use  . Vaping Use: Never used  Substance and Sexual Activity  . Alcohol use: No    Alcohol/week: 0.0 standard drinks  . Drug use: No  . Sexual activity: Not Currently  Other Topics Concern  . Not on file  Social History Narrative  . Not on file   Social Determinants of Health   Financial Resource Strain: Not on file  Food Insecurity: Not on  file  Transportation Needs: Not on file  Physical Activity: Not on file  Stress: Not on file  Social Connections: Not on file  Intimate Partner Violence: Not on file    Family History:    Family History  Problem Relation Age of Onset  . Stroke Father   . Cancer Mother   . Hypertension Mother   . Cancer Sister   . Cancer Sister   . Cancer Sister   . Hypertension Son      ROS:  Please see the history of present illness.   All other ROS reviewed and negative.     Physical Exam/Data:   Vitals:   10/02/20 1600 10/02/20 1625 10/02/20 1630 10/02/20 1700  BP: (!) 155/60  (!) 147/65 (!) 177/65  Pulse: 66  62 63  Resp: 17  16 15   Temp:      TempSrc:      SpO2: 98% 96% 96% 98%  Weight:      Height:        Intake/Output Summary (Last 24 hours) at 10/02/2020 1716 Last data filed at 10/02/2020 1505 Gross per 24 hour  Intake 500 ml  Output --  Net 500 ml   Last 3 Weights 10/02/2020 07/14/2020  07/12/2020  Weight (lbs) 162 lb 11.2 oz 162 lb 11.2 oz 157 lb 13.6 oz  Weight (kg) 73.8 kg 73.8 kg 71.6 kg     Body mass index is 29.76 kg/m.  General:  Well nourished, well developed, in no acute distress HEENT: sclera anicteric Neck: no JVD Vascular: No carotid bruits; distal pulses 2+ bilaterally  Cardiac:  normal S1, S2; IRIR; no murmurs, rubs, or gallops appreciated Lungs:  clear to auscultation bilaterally anteriorly, no wheezing, rhonchi or rales  Abd: soft, nontender, no hepatomegaly  Ext: no edema Musculoskeletal:  No deformities, BUE and BLE strength normal and equal Skin: warm and dry  Neuro:  CNs 2-12 intact, no focal abnormalities noted Psych:  Normal affect   EKG:  The EKG was personally reviewed and demonstrates:  atrial fibrillation, rate 67 bpm, non-specific IVCD, no significant change from previous. Telemetry:  Telemetry was personally reviewed and demonstrates:  Atrial fibrillation with frequent artifact, occasional PVCs, no significant runs of ventricular arrhythmias, NSVT, or pauses   Relevant CV Studies: NST 2015: IMPRESSION:  Abnormal lexiscan myovue suggesting multivessel disease.  Inferolateral wall infarct mid and basal level Distal anterior wall  apical infarct with mild peri infarct ischemia EF estimated at 55%   Cardiac catheterization 2015: Angiographic Findings:  Left main: No obstructive disease.   Left Anterior Descending Artery: Large caliber vessel that courses to the apex. There is a small to moderate caliber diagonal branch. No obstructive disease.   Circumflex Artery: Moderate caliber vessel that terminates into a small to moderate caliber obtuse marginal branch.   Right Coronary Artery: Large dominant vessel with no obstructive disease.   Left Ventricular Angiogram: Deferred due to renal insufficiency  Impression: 1. No angiographic evidence of CAD  Echocardiogram 06/2020: 1. Left ventricular ejection fraction, by estimation, is  35 to 40%. The  left ventricle has moderately decreased function. The left ventricle  demonstrates global hypokinesis. Left ventricular diastolic parameters are  indeterminate.  2. Right ventricular systolic function is normal. The right ventricular  size is normal. There is normal pulmonary artery systolic pressure.  3. The mitral valve is normal in structure. Mild mitral valve  regurgitation. No evidence of mitral stenosis.  4. The aortic valve is tricuspid. There is mild calcification  of the  aortic valve. There is mild thickening of the aortic valve. Aortic valve  regurgitation is moderate. No aortic stenosis is present.   Laboratory Data:  High Sensitivity Troponin:   Recent Labs  Lab 10/02/20 1115 10/02/20 1510  TROPONINIHS 14 15     Chemistry Recent Labs  Lab 10/02/20 1115  NA 140  K 3.9  CL 109  CO2 23  GLUCOSE 103*  BUN 15  CREATININE 1.31*  CALCIUM 8.7*  GFRNONAA 41*  ANIONGAP 8    Recent Labs  Lab 10/02/20 1115  PROT 5.9*  ALBUMIN 3.5  AST 28  ALT 14  ALKPHOS 58  BILITOT 0.9   Hematology Recent Labs  Lab 10/02/20 1115  WBC 7.0  RBC 4.11  HGB 13.4  HCT 40.5  MCV 98.5  MCH 32.6  MCHC 33.1  RDW 13.3  PLT 261   BNPNo results for input(s): BNP, PROBNP in the last 168 hours.  DDimer No results for input(s): DDIMER in the last 168 hours.   Radiology/Studies:  CT HEAD WO CONTRAST  Result Date: 10/02/2020 CLINICAL DATA:  Altered mental status. EXAM: CT HEAD WITHOUT CONTRAST TECHNIQUE: Contiguous axial images were obtained from the base of the skull through the vertex without intravenous contrast. COMPARISON:  July 12, 2020. FINDINGS: Brain: Mild chronic ischemic white matter disease is noted. Minimal diffuse cortical atrophy is noted. Old right frontal lobe and occipital lobe infarctions are noted. No mass effect or midline shift is noted. Ventricular size is within normal limits. There is no evidence of mass lesion, hemorrhage or acute  infarction. Vascular: No hyperdense vessel or unexpected calcification. Skull: Normal. Negative for fracture or focal lesion. Sinuses/Orbits: No acute finding. Other: None. IMPRESSION: Old right frontal and occipital infarctions. No acute intracranial abnormality seen. Electronically Signed   By: Lupita Raider M.D.   On: 10/02/2020 13:40   DG Chest Port 1 View  Result Date: 10/02/2020 CLINICAL DATA:  Syncopal episode. History of CHF and COPD. EXAM: PORTABLE CHEST 1 VIEW COMPARISON:  07/12/2020 FINDINGS: Stable enlarged cardiac silhouette and tortuous and calcified thoracic aorta. The lungs remain clear with normal vascularity. Thoracic spine degenerative changes. IMPRESSION: Stable cardiomegaly. No acute abnormality. Electronically Signed   By: Beckie Salts M.D.   On: 10/02/2020 12:04   DG Abd 2 Views  Result Date: 10/02/2020 CLINICAL DATA:  Vomiting today. Recent fecal impaction. EXAM: ABDOMEN - 2 VIEW COMPARISON:  07/12/2020 FINDINGS: Normal bowel gas pattern without free peritoneal air. Normal amount of stool. Cholecystectomy clips. Left hip prosthesis. Lower thoracic spine degenerative changes and mild lumbar spine degenerative changes. IMPRESSION: No acute abnormality.  Normal amount of stool. Electronically Signed   By: Beckie Salts M.D.   On: 10/02/2020 12:48     Assessment and Plan:   1. Syncope: patient with syncopal episode shortly after having a bowel movement. Has had prior syncope 06/2020 felt to be orthostatic in nature. Thankfully no fall or head injury with current episode. Per niece, she had no complaints of chest pain or palpitations surrounding her event. She is on numerous antihypertensive agents including amlodipine, lasix, hydralazine, and losartan. Episode sounds c/w vasovagal syncope.  - Orthostatic vitals ordered - Further cardiac testing unlikely to aid in her management. Given her advanced dementia, age, and comorbidities she is unlikely to be a candidate for ICD/PPM. -  Recommend bowel regimen per primary team  2. Chronic combined CHF/NICM: EF 35-40% on echo 06/2020. No recent volume overload complaints and she appears euvolemic  on exam. CXR without pulmonary edema. BBlocker previously discontinued in the setting of Afib with SVR. Home losartan, hydralazine, and amlodipine on hold.  - Continue to monitor volume status closely following IVF resuscitation - Monitor strict I&Os and daily weights  3. HTN: BP initially with SBP in the 70s following LOC. On arrival was normotensive, now hypertensive with SBP up to 170s. Home amlodipine, losartan, and hydralazine on hold - Would restart hydralazine with hold parameters at this time.  - Reintroduce home meds as tolerated  4. Permanent atrial fibrillation: longstanding history with history of SVR, now off metoprolol as of 06/2020 following a syncopal episode. She is on eliquis 5mg  BID for stroke ppx given CHA2DS2-VASc Score = 7 [CHF History: Yes, HTN History: Yes, Diabetes History: No, Stroke History: Yes, Vascular Disease History: No, Age Score: 2, Gender Score: 1].  Therefore, the patient's annual risk of stroke is 11.2 %.    - Continue eliquis for stroke ppx - Continue amiodarone for rate/rhythm control  5. History of NSVT/PVCs:  - Continue amiodarone     Risk Assessment/Risk Scores:   New York Heart Association (NYHA) Functional Class NYHA Class II  CHA2DS2-VASc Score = 7  This indicates a 11.2% annual risk of stroke. [name] score is based upon: CHF History: Yes HTN History: Yes Diabetes History: No Stroke History: Yes Vascular Disease History: No Age Score: 2 Gender Score: 1       For questions or updates, please contact [facility] Please consult [email or web address] for contact info under    Signed, , PA-C  10/02/2020 5:16 PM  I have personally seen and examined this patient. I agree with the assessment and plan as outlined above.  Wanda Mckenzie is a 85 yo female with  history of cardiomyopathy, hyperlipidemia, dementia, CKD, persistent atrial fibrillation on Eliquis, prior SVT, PVCs who is admitted following a syncopal episode at home. She had syncope in January 2022 that was felt to be due to orthostasis. Now admitted after a syncopal event that occurred at home following a bowel movement. Her BP was checked by her family at home and her BP was low at that time. EKG in the ED with atrial flutter, rate controlled.Rare PVCs with artifact. Troponin is negative x 2. CBC ok. No electrolyte issues. Echo in January 2022 with LVEF=40% with moderate AI.   My exam: Awake and alert. She is not oriented to person,place or time. February 2022 with diastolic murmur. Lungs: clear bilaterally. Ext: No LE edema   Plan: Syncope: She is in rate controlled atrial fib/flutter. Etiology of her syncope is not clear although it occurred following a bowel movement so this could be vasovagal syncope. I would continue to follow on telemetry for the next 24 hours. If no long pauses or low rates (HR below 50 bpm), may be able to discharge home tomorrow. She has advanced dementia. She will not be a candidate for a pacemaker so I there is no reason to arrange a cardiac monitor at discharge. Given her dementia, I would not consider her a candidate for any advanced cardiac therapies.    [id number]  5:39 PM  10/02/2020

## 2020-10-02 NOTE — ED Notes (Signed)
Pt returned to room  

## 2020-10-02 NOTE — ED Triage Notes (Addendum)
Pt arrived via EMS from home for a witnessed syncopal episode. Per EMS family stated pt Sa02 was 85% on RA bp in 80s. Per EMS when they arrived pt confused and "spaced out." Per EMS pt projectile vomited in route and they gave zofran 4mg  IV. Pt is A&Ox1 to self only.

## 2020-10-02 NOTE — H&P (Addendum)
History and Physical    Wanda Mckenzie:[id number] DOB: 06-04-1936 DOA: 10/02/2020  PCP: [name], FNP (Confirm with patient/family/NH records and if not entered, this has to be entered at [facility] point of entry) Patient coming from: [facility]  I have personally briefly reviewed patient's old medical records in [facility]  Chief Complaint: Syncope  HPI: Wanda Mckenzie is a 85 y.o. female with medical history significant of chronic systolic CHF (LVEF 40 to 45%), HLD, CKD stage III, vascular dementia, chronic A. fib on Eliquis, frequent PVCs on amiodarone, presented with recurrent syncope.  Today's episode was witnessed by patient's niece.  Niece reported that the patient had a bowel movement then came out looked pale and slumped over in the chair.  Lost consciousness for <1 minutes, looked pale and tachypneic.  Niece immediately measured patient blood pressure and reading was SBP 70s, and O2 saturation lower 80s and tachypneic.  Then slowly patient recovered consciousness but remained confused.  Patient denied any chest pain, complaining about feeling nausea. ED Course: Parents x1 in ED, blood pressure elevated, telemetry monitor showed frequent PVCs and occasional bradycardia. CT head negative for bleed.  Review of Systems: Unable to perform, patient postictal.  Past Medical History:  Diagnosis Date  . Atrial fibrillation (HCC)   . COPD (chronic obstructive pulmonary disease) (HCC)   . Dyslipidemia   . Essential hypertension   . History of cardiac catheterization 11/2013   Normal coronaries  . History of stroke    Right MCA distribution     Past Surgical History:  Procedure Laterality Date  . ABDOMINAL HYSTERECTOMY  1975  . FRACTURE SURGERY Left   . LEFT HEART CATHETERIZATION WITH CORONARY ANGIOGRAM N/A 11/23/2013   Procedure: LEFT HEART CATHETERIZATION WITH CORONARY ANGIOGRAM;  Surgeon: Kathleene Hazel, MD;  Location: [facility] CATH LAB;  Service: Cardiovascular;   Laterality: N/A;  . LEFT HIP HEMIARTHROPLASTY    . TRIBUTARY VARICOSITIES OF RIGHT LEG  05/23/2002  . VEIN LIGATION AND STRIPPING Right 02/08/2013   Procedure: Segmental excision of painful varicose veins right lower extremity;  Surgeon: Chuck Hint, MD;  Location: [facility] OR;  Service: Vascular;  Laterality: Right;     reports that she has never smoked. She has never used smokeless tobacco. She reports that she does not drink alcohol and does not use drugs.  Allergies  Allergen Reactions  . Codeine Nausea And Vomiting    Family History  Problem Relation Age of Onset  . Stroke Father   . Cancer Mother   . Hypertension Mother   . Cancer Sister   . Cancer Sister   . Cancer Sister   . Hypertension Son      Prior to Admission medications   Medication Sig Start Date End Date Taking? Authorizing Provider  acetaminophen (TYLENOL) 500 MG tablet Take 1,000 mg by mouth every 6 (six) hours as needed for headache (pain).    [provider]  albuterol (VENTOLIN HFA) 108 (90 Base) MCG/ACT inhaler Inhale 2 puffs into the lungs every 6 (six) hours as needed for wheezing or shortness of breath. 01/10/19   Deliah Boston F, FNP  amiodarone (PACERONE) 200 MG tablet TAKE 1 TABLET ONCE DAILY. 09/07/20   [name], FNP  amLODipine (NORVASC) 5 MG tablet Take 1 tablet (5 mg total) by mouth daily. 09/07/20   [name], FNP  atorvastatin (LIPITOR) 20 MG tablet TAKE (1) TABLET DAILY AT BEDTIME. Patient taking differently: Take 20 mg by mouth at  bedtime. 06/18/20   [name], FNP  diclofenac Sodium (VOLTAREN) 1 % GEL Apply 2 g topically 4 (four) times daily. 06/05/20   Daryll Drown, NP  ELIQUIS 5 MG TABS tablet TAKE ONE TABLET TWICE A DAY. 09/25/20   [name], FNP  furosemide (LASIX) 20 MG tablet TAKE 1 TAB. 3 TIMES A WEEK; MONDAY, WEDNESDAY & FRIDAY. 09/20/20   [name], FNP  hydrALAZINE (APRESOLINE) 100 MG tablet TAKE (1) TABLET EVERY EIGHT HOURS. 08/15/20    Deliah Boston F, FNP  levothyroxine (SYNTHROID) 25 MCG tablet TAKE (1/2) TABLET DAILY. (NEEDS LABWORK) 09/19/20   [name], FNP  losartan (COZAAR) 100 MG tablet TAKE 1 TABLET DAILY. 09/25/20   [name], FNP  memantine (NAMENDA XR) 28 MG CP24 24 hr capsule TAKE (1) CAPSULE DAILY. 09/25/20   [name], FNP  methocarbamol (ROBAXIN) 500 MG tablet Take 1 tablet (500 mg total) by mouth every 8 (eight) hours as needed for muscle spasms. 07/16/20   Osvaldo Shipper, MD  potassium chloride SA (KLOR-CON) 20 MEQ tablet Take 1 tablet (20 mEq total) by mouth daily. 07/16/20   Osvaldo Shipper, MD  Spacer/Aero-Holding Chambers (AEROCHAMBER PLUS) inhaler Use as instructed 01/10/19   [name], FNP    Physical Exam: Vitals:   10/02/20 1330 10/02/20 1400 10/02/20 1430 10/02/20 1500  BP: (!) 163/68 (!) 161/67 (!) 163/66 (!) 160/69  Pulse: 71 62 60 68  Resp: 16 14 17 14   Temp:      TempSrc:      SpO2: 97% 96% 97% 98%  Weight:      Height:        Constitutional: NAD, calm, comfortable Vitals:   10/02/20 1330 10/02/20 1400 10/02/20 1430 10/02/20 1500  BP: (!) 163/68 (!) 161/67 (!) 163/66 (!) 160/69  Pulse: 71 62 60 68  Resp: 16 14 17 14   Temp:      TempSrc:      SpO2: 97% 96% 97% 98%  Weight:      Height:       Eyes: PERRL, lids and conjunctivae normal ENMT: Mucous membranes are dry. Posterior pharynx clear of any exudate or lesions.Normal dentition.  Neck: normal, supple, no masses, no thyromegaly Respiratory: clear to auscultation bilaterally, no wheezing, no crackles. Normal respiratory effort. No accessory muscle use.  Cardiovascular: Irregular heart rate, no murmurs / rubs / gallops. No extremity edema. 2+ pedal pulses. No carotid bruits.  Abdomen: no tenderness, no masses palpated. No hepatosplenomegaly. Bowel sounds positive.  Musculoskeletal: no clubbing / cyanosis. No joint deformity upper and lower extremities. Good ROM, no contractures. Normal muscle tone.  Skin:  no rashes, lesions, ulcers. No induration Neurologic: No facial droop, following simple commands Psychiatric: Very confused   Labs on Admission: I have personally reviewed following labs and imaging studies  CBC: Recent Labs  Lab 10/02/20 1115  WBC 7.0  NEUTROABS 4.7  HGB 13.4  HCT 40.5  MCV 98.5  PLT 261   Basic Metabolic Panel: Recent Labs  Lab 10/02/20 1115  NA 140  K 3.9  CL 109  CO2 23  GLUCOSE 103*  BUN 15  CREATININE 1.31*  CALCIUM 8.7*   GFR: Estimated Creatinine Clearance: 31.2 mL/min (A) (by C-G formula based on SCr of 1.31 mg/dL (H)). Liver Function Tests: Recent Labs  Lab 10/02/20 1115  AST 28  ALT 14  ALKPHOS 58  BILITOT 0.9  PROT 5.9*  ALBUMIN 3.5   No results for input(s):  LIPASE, AMYLASE in the last 168 hours. No results for input(s): AMMONIA in the last 168 hours. Coagulation Profile: No results for input(s): INR, PROTIME in the last 168 hours. Cardiac Enzymes: No results for input(s): CKTOTAL, CKMB, CKMBINDEX, TROPONINI in the last 168 hours. BNP (last 3 results) No results for input(s): PROBNP in the last 8760 hours. HbA1C: No results for input(s): HGBA1C in the last 72 hours. CBG: Recent Labs  Lab 10/02/20 1149  GLUCAP 93   Lipid Profile: No results for input(s): CHOL, HDL, LDLCALC, TRIG, CHOLHDL, LDLDIRECT in the last 72 hours. Thyroid Function Tests: No results for input(s): TSH, T4TOTAL, FREET4, T3FREE, THYROIDAB in the last 72 hours. Anemia Panel: No results for input(s): VITAMINB12, FOLATE, FERRITIN, TIBC, IRON, RETICCTPCT in the last 72 hours. Urine analysis:    Component Value Date/Time   COLORURINE YELLOW 07/10/2020 1356   APPEARANCEUR HAZY (A) 07/10/2020 1356   APPEARANCEUR Clear 02/17/2019 1154   LABSPEC 1.021 07/10/2020 1356   PHURINE 5.0 07/10/2020 1356   GLUCOSEU NEGATIVE 07/10/2020 1356   HGBUR NEGATIVE 07/10/2020 1356   BILIRUBINUR NEGATIVE 07/10/2020 1356   BILIRUBINUR Negative 02/17/2019 1154    KETONESUR NEGATIVE 07/10/2020 1356   PROTEINUR 30 (A) 07/10/2020 1356   UROBILINOGEN 1.0 02/08/2013 0911   NITRITE NEGATIVE 07/10/2020 1356   LEUKOCYTESUR NEGATIVE 07/10/2020 1356    Radiological Exams on Admission: CT HEAD WO CONTRAST  Result Date: 10/02/2020 CLINICAL DATA:  Altered mental status. EXAM: CT HEAD WITHOUT CONTRAST TECHNIQUE: Contiguous axial images were obtained from the base of the skull through the vertex without intravenous contrast. COMPARISON:  July 12, 2020. FINDINGS: Brain: Mild chronic ischemic white matter disease is noted. Minimal diffuse cortical atrophy is noted. Old right frontal lobe and occipital lobe infarctions are noted. No mass effect or midline shift is noted. Ventricular size is within normal limits. There is no evidence of mass lesion, hemorrhage or acute infarction. Vascular: No hyperdense vessel or unexpected calcification. Skull: Normal. Negative for fracture or focal lesion. Sinuses/Orbits: No acute finding. Other: None. IMPRESSION: Old right frontal and occipital infarctions. No acute intracranial abnormality seen. Electronically Signed   By: Lupita Raider M.D.   On: 10/02/2020 13:40   DG Chest Port 1 View  Result Date: 10/02/2020 CLINICAL DATA:  Syncopal episode. History of CHF and COPD. EXAM: PORTABLE CHEST 1 VIEW COMPARISON:  07/12/2020 FINDINGS: Stable enlarged cardiac silhouette and tortuous and calcified thoracic aorta. The lungs remain clear with normal vascularity. Thoracic spine degenerative changes. IMPRESSION: Stable cardiomegaly. No acute abnormality. Electronically Signed   By: Beckie Salts M.D.   On: 10/02/2020 12:04   DG Abd 2 Views  Result Date: 10/02/2020 CLINICAL DATA:  Vomiting today. Recent fecal impaction. EXAM: ABDOMEN - 2 VIEW COMPARISON:  07/12/2020 FINDINGS: Normal bowel gas pattern without free peritoneal air. Normal amount of stool. Cholecystectomy clips. Left hip prosthesis. Lower thoracic spine degenerative changes and mild  lumbar spine degenerative changes. IMPRESSION: No acute abnormality.  Normal amount of stool. Electronically Signed   By: Beckie Salts M.D.   On: 10/02/2020 12:48    EKG: Independently reviewed.  Baseline A. fib with episodes of bradycardia and frequent PVCs heart rate as low as lower 50s.  Assessment/Plan Active Problems:   * No active hospital problems. *  (please populate well all problems here in Problem List. (For example, if patient is on BP meds at home and you resume or decide to hold them, it is a problem that needs to be her. Same  for CAD, COPD, HLD and so on)  Recurrent syncope -Appears to be a vasovagal etiology, suspect either some degenerated ventricular arrhythmia as happened in January (also has prolonged QTC) versus a bradycardia as shown on the telemetry monitoring in the ED.  Blood pressure was significant depressed as caught by niece at home with signs of hypoperfusion. -Consult cardiology to help [place] manage frequent PVCs and bradycardia.  Probably a challenging case, defer AICD/PPM discussion to cardiology. -Check magnesium and a phosphorus -Decided to hold off today's blood pressure medications, only ordered as needed hydralazine for now.  Hold Lasix today as well.  Borderline bradycardia -As above  QTC prolongation -Repeat EKG in AM.  CKD stage III -Appears to be dry, hold Lasix today  Chronic A. Fib -Hold Eliquis for today, repeat CBC tomorrow if H&H stable, resume Eliquis tomorrow.  DVT prophylaxis:SCD-> Eliquis tomorrow  code Status: DNR Family Communication: Niece/Care giver at bedside Disposition Plan: Expect more than 2 midnight hospital stay, and PT evaluation. Consults called: Cardiology Admission status: PCU   Emeline General MD [facility] Pager [phone]  10/02/2020, 3:19 PM

## 2020-10-02 NOTE — Progress Notes (Signed)
Pt arrived to unit from ED at approx 1835. Skin check done with Oceanographer. Pt's niece Mariane Duval present with patient and is her caregiver at home. She was able to ask many questions in order for [place] to do the pt's admission. Her number is [phone]. Pt is only oriented to self.

## 2020-10-02 NOTE — ED Notes (Signed)
Patient at this time with heart rate of 120s-140s. ECG printed and given to ED MD, patient at baseline resting in bed in NAD at this time. Orders to follow.

## 2020-10-02 NOTE — ED Provider Notes (Signed)
[facility] EMERGENCY DEPARTMENT Provider Note   CSN: [id number] Arrival date & time: 10/02/20  1053     History Chief Complaint  Patient presents with  . Loss of Consciousness    Wanda Mckenzie is a 85 y.o. female.  Patient with history of stroke, dementia, atrial fibrillation on anticoagulation, nonsustained VT on amiodarone --presents today for witnessed syncopal episode.  Level 5 caveat due to dementia.  Patient vomited in route to the hospital today and was given Zofran by EMS. She had admission for syncope, likely orthostatic in nature, in January.  She had echocardiogram showing decreased systolic function. Non-sustained VT diagnosed during that hospitalization.   Additional history from niece.  Patient had fecal impaction a couple of days ago.  She had a small bowel movement this morning that was solid.  Patient awoke in her normal state of health.  She ate breakfast without any problems.  While at the table, niece noted that she was slumped over and not responding.  She was clammy and broke out into a cold sweat.  She did not fall or hit her head.  Niece was able to check the patient's blood pressure and pulse.  Blood pressure was found to be low.  She reports that the pulse ox was 87%.  EMS was called for transport to the hospital.  She slowly recovered after that point but seemed confused.  No compelling signs of seizure activity.        Past Medical History:  Diagnosis Date  . Atrial fibrillation (HCC)   . COPD (chronic obstructive pulmonary disease) (HCC)   . Dyslipidemia   . Essential hypertension   . History of cardiac catheterization 11/2013   Normal coronaries  . History of stroke    Right MCA distribution     Patient Active Problem List   Diagnosis Date Noted  . Chronic systolic CHF (congestive heart failure) (HCC) 07/12/2020  . Elevated troponin level not due myocardial infarction 07/12/2020  . Acute bilateral low back pain with sciatica  06/05/2020  . Nonischemic cardiomyopathy (HCC) 06/02/2019  . Stage 3b chronic kidney disease (HCC) 05/14/2019  . Elevated TSH 04/13/2019  . Late effects of CVA (cerebrovascular accident) 04/06/2018  . Mixed hyperlipidemia 06/19/2016  . Vascular dementia without behavioral disturbance (HCC) 06/08/2014  . Varicose veins of lower extremities with other complications 02/02/2013  . Essential hypertension   . History of stroke   . Aortic insufficiency   . COPD (chronic obstructive pulmonary disease) (HCC)   . Chronic atrial fibrillation (HCC)   . Carotid bruit     Past Surgical History:  Procedure Laterality Date  . ABDOMINAL HYSTERECTOMY  1975  . FRACTURE SURGERY Left   . LEFT HEART CATHETERIZATION WITH CORONARY ANGIOGRAM N/A 11/23/2013   Procedure: LEFT HEART CATHETERIZATION WITH CORONARY ANGIOGRAM;  Surgeon: Kathleene Hazel, MD;  Location: [facility] CATH LAB;  Service: Cardiovascular;  Laterality: N/A;  . LEFT HIP HEMIARTHROPLASTY    . TRIBUTARY VARICOSITIES OF RIGHT LEG  05/23/2002  . VEIN LIGATION AND STRIPPING Right 02/08/2013   Procedure: Segmental excision of painful varicose veins right lower extremity;  Surgeon: Chuck Hint, MD;  Location: [facility] OR;  Service: Vascular;  Laterality: Right;     OB History   No obstetric history on file.     Family History  Problem Relation Age of Onset  . Stroke Father   . Cancer Mother   . Hypertension Mother   . Cancer Sister   . Cancer  Sister   . Cancer Sister   . Hypertension Son     Social History   Tobacco Use  . Smoking status: Never Smoker  . Smokeless tobacco: Never Used  Vaping Use  . Vaping Use: Never used  Substance Use Topics  . Alcohol use: No    Alcohol/week: 0.0 standard drinks  . Drug use: No    Home Medications Prior to Admission medications   Medication Sig Start Date End Date Taking? Authorizing Provider  acetaminophen (TYLENOL) 500 MG tablet Take 1,000 mg by mouth every 6 (six) hours as needed for  headache (pain).    [provider]  albuterol (VENTOLIN HFA) 108 (90 Base) MCG/ACT inhaler Inhale 2 puffs into the lungs every 6 (six) hours as needed for wheezing or shortness of breath. 01/10/19   Deliah Boston F, FNP  amiodarone (PACERONE) 200 MG tablet TAKE 1 TABLET ONCE DAILY. 09/07/20   [name], FNP  amLODipine (NORVASC) 5 MG tablet Take 1 tablet (5 mg total) by mouth daily. 09/07/20   [name], FNP  atorvastatin (LIPITOR) 20 MG tablet TAKE (1) TABLET DAILY AT BEDTIME. Patient taking differently: Take 20 mg by mouth at bedtime. 06/18/20   [name], FNP  diclofenac Sodium (VOLTAREN) 1 % GEL Apply 2 g topically 4 (four) times daily. 06/05/20   Daryll Drown, NP  ELIQUIS 5 MG TABS tablet TAKE ONE TABLET TWICE A DAY. 09/25/20   [name], FNP  furosemide (LASIX) 20 MG tablet TAKE 1 TAB. 3 TIMES A WEEK; MONDAY, WEDNESDAY & FRIDAY. 09/20/20   [name], FNP  hydrALAZINE (APRESOLINE) 100 MG tablet TAKE (1) TABLET EVERY EIGHT HOURS. 08/15/20   Deliah Boston F, FNP  levothyroxine (SYNTHROID) 25 MCG tablet TAKE (1/2) TABLET DAILY. (NEEDS LABWORK) 09/19/20   [name], FNP  losartan (COZAAR) 100 MG tablet TAKE 1 TABLET DAILY. 09/25/20   [name], FNP  memantine (NAMENDA XR) 28 MG CP24 24 hr capsule TAKE (1) CAPSULE DAILY. 09/25/20   [name], FNP  methocarbamol (ROBAXIN) 500 MG tablet Take 1 tablet (500 mg total) by mouth every 8 (eight) hours as needed for muscle spasms. 07/16/20   Osvaldo Shipper, MD  potassium chloride SA (KLOR-CON) 20 MEQ tablet Take 1 tablet (20 mEq total) by mouth daily. 07/16/20   Osvaldo Shipper, MD  Spacer/Aero-Holding Chambers (AEROCHAMBER PLUS) inhaler Use as instructed 01/10/19   [name], FNP    Allergies    Codeine  Review of Systems   Review of Systems  Unable to perform ROS: Dementia    Physical Exam Updated Vital Signs BP 122/67   Pulse 62   Temp (!) 97.4 F (36.3 C) (Axillary)   Resp 16    Ht 5\' 2"  (1.575 m)   Wt 73.8 kg   SpO2 98%   BMI 29.76 kg/m   Physical Exam Vitals and nursing note reviewed.  Constitutional:      General: She is not in acute distress.    Appearance: She is well-developed.  HENT:     Head: Normocephalic and atraumatic.     Right Ear: External ear normal.     Left Ear: External ear normal.     Nose: Nose normal.  Eyes:     Conjunctiva/sclera: Conjunctivae normal.  Cardiovascular:     Rate and Rhythm: Normal rate and regular rhythm.     Heart sounds: No murmur heard.   Pulmonary:     Effort: No  respiratory distress.     Breath sounds: No wheezing, rhonchi or rales.  Abdominal:     Palpations: Abdomen is soft.     Tenderness: There is no abdominal tenderness. There is no guarding or rebound.  Musculoskeletal:     Cervical back: Normal range of motion and neck supple.     Right lower leg: No edema.     Left lower leg: No edema.  Skin:    General: Skin is warm and dry.     Findings: No rash.  Neurological:     General: No focal deficit present.     Mental Status: She is alert. Mental status is at baseline. She is disoriented.     Motor: No weakness.  Psychiatric:        Mood and Affect: Mood normal.     ED Results / Procedures / Treatments   Labs (all labs ordered are listed, but only abnormal results are displayed) Labs Reviewed  COMPREHENSIVE METABOLIC PANEL - Abnormal; Notable for the following components:      Result Value   Glucose, Bld 103 (*)    Creatinine, Ser 1.31 (*)    Calcium 8.7 (*)    Total Protein 5.9 (*)    GFR, Estimated 41 (*)    All other components within normal limits  URINE CULTURE  CBC WITH DIFFERENTIAL/PLATELET  URINALYSIS, COMPLETE (UACMP) WITH MICROSCOPIC  CBG MONITORING, ED  TROPONIN I (HIGH SENSITIVITY)  TROPONIN I (HIGH SENSITIVITY)    ED ECG REPORT   Date: 10/02/2020  Rate: 67  Rhythm: atrial fibrillation  QRS Axis: right  Intervals: normal  ST/T Wave abnormalities: nonspecific T  wave changes  Conduction Disutrbances:nonspecific intraventricular conduction delay  Narrative Interpretation:   Old EKG Reviewed: unchanged except axis 06/2020  I have personally reviewed the EKG tracing and agree with the computerized printout as noted.  Radiology CT HEAD WO CONTRAST  Result Date: 10/02/2020 CLINICAL DATA:  Altered mental status. EXAM: CT HEAD WITHOUT CONTRAST TECHNIQUE: Contiguous axial images were obtained from the base of the skull through the vertex without intravenous contrast. COMPARISON:  July 12, 2020. FINDINGS: Brain: Mild chronic ischemic white matter disease is noted. Minimal diffuse cortical atrophy is noted. Old right frontal lobe and occipital lobe infarctions are noted. No mass effect or midline shift is noted. Ventricular size is within normal limits. There is no evidence of mass lesion, hemorrhage or acute infarction. Vascular: No hyperdense vessel or unexpected calcification. Skull: Normal. Negative for fracture or focal lesion. Sinuses/Orbits: No acute finding. Other: None. IMPRESSION: Old right frontal and occipital infarctions. No acute intracranial abnormality seen. Electronically Signed   By: Lupita Raider M.D.   On: 10/02/2020 13:40   DG Chest Port 1 View  Result Date: 10/02/2020 CLINICAL DATA:  Syncopal episode. History of CHF and COPD. EXAM: PORTABLE CHEST 1 VIEW COMPARISON:  07/12/2020 FINDINGS: Stable enlarged cardiac silhouette and tortuous and calcified thoracic aorta. The lungs remain clear with normal vascularity. Thoracic spine degenerative changes. IMPRESSION: Stable cardiomegaly. No acute abnormality. Electronically Signed   By: Beckie Salts M.D.   On: 10/02/2020 12:04   DG Abd 2 Views  Result Date: 10/02/2020 CLINICAL DATA:  Vomiting today. Recent fecal impaction. EXAM: ABDOMEN - 2 VIEW COMPARISON:  07/12/2020 FINDINGS: Normal bowel gas pattern without free peritoneal air. Normal amount of stool. Cholecystectomy clips. Left hip prosthesis.  Lower thoracic spine degenerative changes and mild lumbar spine degenerative changes. IMPRESSION: No acute abnormality.  Normal amount of stool. Electronically Signed  By: Beckie Salts M.D.   On: 10/02/2020 12:48    Procedures Procedures   Medications Ordered in ED Medications  sodium chloride 0.9 % bolus 500 mL (0 mLs Intravenous Stopped 10/02/20 1505)  ondansetron (ZOFRAN) injection 4 mg (4 mg Intravenous Given 10/02/20 1321)    ED Course  I have reviewed the triage vital signs and the nursing notes.  Pertinent labs & imaging results that were available during my care of the patient were reviewed by me and considered in my medical decision making (see chart for details).  Patient seen and examined. Work-up initiated. Medications ordered.   Vital signs reviewed and are as follows: BP 122/67   Pulse 62   Temp (!) 97.4 F (36.3 C) (Axillary)   Resp 16   Ht 5\' 2"  (1.575 m)   Wt 73.8 kg   SpO2 98%   BMI 29.76 kg/m   11:48 AM Spoke with patient's niece who was present with her today.   3:14 PM patient stable during ED stay.  Heavy PVC burden on telemetry.  These continues to be at bedside.  Updated on results to this point.  Mild AKI noted, gentle hydration ordered.   Patient discussed with and seen by Dr. .  3:22 PM Patient discussed with Dr. Jacqulyn Bath of [facility] who will see.     MDM Rules/Calculators/A&P                          Admit -- syncope high risk.    Final Clinical Impression(s) / ED Diagnoses Final diagnoses:  Syncope, unspecified syncope type  AKI (acute kidney injury) (HCC)  Non-intractable vomiting without nausea, unspecified vomiting type    Rx / DC Orders ED Discharge Orders    None       Chipper Herb, PA-C 10/02/20 1522    12/02/20, MD 10/04/20 [phone]

## 2020-10-02 NOTE — ED Notes (Signed)
Attempted report at this time.

## 2020-10-02 NOTE — ED Notes (Signed)
I was unable to get pt to sign MSE form, because pt has dementia and no family is present.

## 2020-10-02 NOTE — ED Notes (Signed)
Patient transported to X-ray 

## 2020-10-03 DIAGNOSIS — R55 Syncope and collapse: Secondary | ICD-10-CM | POA: Diagnosis not present

## 2020-10-03 LAB — CBC
HCT: 39.4 % (ref 36.0–46.0)
Hemoglobin: 13.2 g/dL (ref 12.0–15.0)
MCH: 32.6 pg (ref 26.0–34.0)
MCHC: 33.5 g/dL (ref 30.0–36.0)
MCV: 97.3 fL (ref 80.0–100.0)
Platelets: 257 10*3/uL (ref 150–400)
RBC: 4.05 MIL/uL (ref 3.87–5.11)
RDW: 13.1 % (ref 11.5–15.5)
WBC: 8.6 10*3/uL (ref 4.0–10.5)
nRBC: 0 % (ref 0.0–0.2)

## 2020-10-03 LAB — BASIC METABOLIC PANEL
Anion gap: 7 (ref 5–15)
BUN: 14 mg/dL (ref 8–23)
CO2: 26 mmol/L (ref 22–32)
Calcium: 8.8 mg/dL — ABNORMAL LOW (ref 8.9–10.3)
Chloride: 106 mmol/L (ref 98–111)
Creatinine, Ser: 0.99 mg/dL (ref 0.44–1.00)
GFR, Estimated: 57 mL/min — ABNORMAL LOW (ref >60)
Glucose, Bld: 91 mg/dL (ref 70–99)
Potassium: 3.5 mmol/L (ref 3.5–5.1)
Sodium: 139 mmol/L (ref 135–145)

## 2020-10-03 LAB — GLUCOSE, CAPILLARY: Glucose-Capillary: 98 mg/dL (ref 70–99)

## 2020-10-03 MED ORDER — HYDRALAZINE HCL 20 MG/ML IJ SOLN
10.0000 mg | Freq: Four times a day (QID) | INTRAMUSCULAR | Status: DC | PRN
  Administered 2020-10-04: 10 mg via INTRAVENOUS
  Filled 2020-10-03 (×2): qty 1

## 2020-10-03 MED ORDER — POTASSIUM CHLORIDE CRYS ER 20 MEQ PO TBCR: 20.0000 meq | EXTENDED_RELEASE_TABLET | Freq: Every day | ORAL | Status: DC

## 2020-10-03 MED ORDER — PANTOPRAZOLE SODIUM 40 MG PO TBEC
40.0000 mg | DELAYED_RELEASE_TABLET | Freq: Every day | ORAL | Status: DC
  Administered 2020-10-04 – 2020-10-05 (×2): 40 mg via ORAL
  Filled 2020-10-03 (×3): qty 1

## 2020-10-03 MED ORDER — AMLODIPINE BESYLATE 10 MG PO TABS
10.0000 mg | ORAL_TABLET | Freq: Every day | ORAL | Status: DC
  Administered 2020-10-03 – 2020-10-05 (×3): 10 mg via ORAL
  Filled 2020-10-03 (×4): qty 1

## 2020-10-03 MED ORDER — POTASSIUM CHLORIDE CRYS ER 20 MEQ PO TBCR: 40.0000 meq | EXTENDED_RELEASE_TABLET | Freq: Once | ORAL | Status: DC

## 2020-10-03 MED ORDER — POTASSIUM CHLORIDE 20 MEQ PO PACK
20.0000 meq | PACK | Freq: Every day | ORAL | Status: DC
  Administered 2020-10-03 – 2020-10-05 (×3): 20 meq via ORAL
  Filled 2020-10-03 (×4): qty 1

## 2020-10-03 NOTE — Progress Notes (Signed)
Progress Note  Patient Name: Wanda Mckenzie Date of Encounter: 10/03/2020  [facility] Cardiologist: Nona Dell, MD   Subjective   No complaints  Inpatient Medications    Scheduled Meds: . amiodarone  200 mg Oral Daily  . amLODipine  10 mg Oral Daily  . apixaban  5 mg Oral BID  . atorvastatin  20 mg Oral QHS  . diclofenac Sodium  2 g Topical QID  . hydrALAZINE  100 mg Oral Q8H  . levothyroxine  25 mcg Oral Q0600  . memantine  28 mg Oral Daily  . pantoprazole  40 mg Oral Daily  . potassium chloride  20 mEq Oral Daily  . sodium chloride flush  3 mL Intravenous Q12H   Continuous Infusions:  PRN Meds: acetaminophen **OR** acetaminophen, hydrALAZINE, ipratropium, methocarbamol, [DISCONTINUED] ondansetron **OR** ondansetron (ZOFRAN) IV   Vital Signs    Vitals:   10/03/20 0406 10/03/20 0500 10/03/20 0716 10/03/20 0900  BP: (!) 166/75  (!) 183/80   Pulse: 64  63   Resp: 13  13   Temp: 98 F (36.7 C)  98 F (36.7 C)   TempSrc: Axillary  Axillary   SpO2: 100%  96% 98%  Weight:      Height:  6\' 2"  (1.88 m)      Intake/Output Summary (Last 24 hours) at 10/03/2020 1049 Last data filed at 10/03/2020 0600 Gross per 24 hour  Intake 500 ml  Output 400 ml  Net 100 ml   Last 3 Weights 10/02/2020 07/14/2020 07/12/2020  Weight (lbs) 162 lb 11.2 oz 162 lb 11.2 oz 157 lb 13.6 oz  Weight (kg) 73.8 kg 73.8 kg 71.6 kg      Telemetry    Atrial fib, rate controlled - Personally Reviewed  ECG      Physical Exam   GEN: No acute distress.  Not oriented to person,place or time.  Neck: No JVD Cardiac: Irreg irreg without murmurs, rubs, or gallops.  Respiratory: Clear to auscultation bilaterally. GI: Soft, nontender, non-distended  MS: No edema; No deformity. Neuro:  Nonfocal  Psych: Normal affect   Labs    High Sensitivity Troponin:   Recent Labs  Lab 10/02/20 1115 10/02/20 1510  TROPONINIHS 14 15      Chemistry Recent Labs  Lab 10/02/20 1115  10/03/20 0053  NA 140 139  K 3.9 3.5  CL 109 106  CO2 23 26  GLUCOSE 103* 91  BUN 15 14  CREATININE 1.31* 0.99  CALCIUM 8.7* 8.8*  PROT 5.9*  --   ALBUMIN 3.5  --   AST 28  --   ALT 14  --   ALKPHOS 58  --   BILITOT 0.9  --   GFRNONAA 41* 57*  ANIONGAP 8 7     Hematology Recent Labs  Lab 10/02/20 1115 10/03/20 0053  WBC 7.0 8.6  RBC 4.11 4.05  HGB 13.4 13.2  HCT 40.5 39.4  MCV 98.5 97.3  MCH 32.6 32.6  MCHC 33.1 33.5  RDW 13.3 13.1  PLT 261 257    BNPNo results for input(s): BNP, PROBNP in the last 168 hours.   DDimer No results for input(s): DDIMER in the last 168 hours.   Radiology    CT HEAD WO CONTRAST  Result Date: 10/02/2020 CLINICAL DATA:  Altered mental status. EXAM: CT HEAD WITHOUT CONTRAST TECHNIQUE: Contiguous axial images were obtained from the base of the skull through the vertex without intravenous contrast. COMPARISON:  July 12, 2020. FINDINGS: Brain:  Mild chronic ischemic white matter disease is noted. Minimal diffuse cortical atrophy is noted. Old right frontal lobe and occipital lobe infarctions are noted. No mass effect or midline shift is noted. Ventricular size is within normal limits. There is no evidence of mass lesion, hemorrhage or acute infarction. Vascular: No hyperdense vessel or unexpected calcification. Skull: Normal. Negative for fracture or focal lesion. Sinuses/Orbits: No acute finding. Other: None. IMPRESSION: Old right frontal and occipital infarctions. No acute intracranial abnormality seen. Electronically Signed   By: Lupita Raider M.D.   On: 10/02/2020 13:40   DG Chest Port 1 View  Result Date: 10/02/2020 CLINICAL DATA:  Syncopal episode. History of CHF and COPD. EXAM: PORTABLE CHEST 1 VIEW COMPARISON:  07/12/2020 FINDINGS: Stable enlarged cardiac silhouette and tortuous and calcified thoracic aorta. The lungs remain clear with normal vascularity. Thoracic spine degenerative changes. IMPRESSION: Stable cardiomegaly. No acute  abnormality. Electronically Signed   By: Beckie Salts M.D.   On: 10/02/2020 12:04   DG Abd 2 Views  Result Date: 10/02/2020 CLINICAL DATA:  Vomiting today. Recent fecal impaction. EXAM: ABDOMEN - 2 VIEW COMPARISON:  07/12/2020 FINDINGS: Normal bowel gas pattern without free peritoneal air. Normal amount of stool. Cholecystectomy clips. Left hip prosthesis. Lower thoracic spine degenerative changes and mild lumbar spine degenerative changes. IMPRESSION: No acute abnormality.  Normal amount of stool. Electronically Signed   By: Beckie Salts M.D.   On: 10/02/2020 12:48    Cardiac Studies     Patient Profile     85 y.o. female with history of cardiomyopathy, hyperlipidemia, dementia, CKD, persistent atrial fibrillation on Eliquis, prior SVT, PVCs who is admitted following a syncopal episode at home. She had syncope in January 2022 that was felt to be due to orthostasis. Now admitted after a syncopal event that occurred at home following a bowel movement. Her BP was checked by her family at home and her BP was low at that time. EKG in the ED with atrial flutter, rate controlled.Rare PVCs with artifact. Troponin is negative x 2. CBC ok. No electrolyte issues. Echo in January 2022 with LVEF=40% with moderate AI.   Assessment & Plan    1. Syncope: Unclear etiology but suspect vasovagal syncope in this pleasant patient with advanced dementia. Telemetry reviewed and shows atrial fibrillation, which is permanent. No heart block is seen but she is not a candidate for advanced therapies such as a pacemaker given her dementia.  I would not recommend further cardiac workup at this time.   Cardiology will sign off.    For questions or updates, please contact [facility] Please consult [email or web address] for contact info under        Signed, Verne Carrow, MD  10/03/2020, 10:49 AM

## 2020-10-03 NOTE — Progress Notes (Addendum)
PROGRESS NOTE                                                                                                                                                                                                             Patient Demographics:    Wanda Mckenzie, is a 85 y.o. female, DOB - 27-Oct-1935, ALP:[id number]  Outpatient Primary MD for the patient is [name], FNP    LOS - 1  Admit date - 10/02/2020    Chief Complaint  Patient presents with  . Loss of Consciousness       Brief Narrative (HPI from H&P)  Wanda Mckenzie is a 85 y.o. female with medical history significant of chronic systolic CHF (LVEF 40 to 45%), HLD, CKD stage III, vascular dementia, chronic A. fib on Eliquis, frequent PVCs on amiodarone, presented with syncope while sitting on the chair, was noted to be hypotensive at that time as well, recently on a previous admission she had a similar issue and was found to be orthostatic.   Subjective:    Wanda Mckenzie today has, No headache, No chest pain, No abdominal pain - No Nausea, No new weakness tingling or numbness, no SOB, confused.   Assessment  & Plan :   Recurrent syncope  - while having a BM, likely vasovagal/orthostatic etiology, BP was low at that time, seen by Cards not a candidate for AICD/PPM, hydrated, TEDs, PT, monitor.    Borderline bradycardia - As above, avoid nodal blocking agents.  QTC prolongation - resolved on repeat EKG .  AKI -  Resolved after, holding Lasix - ARB & gentle IVF.  Chronic A. Fib -CHA2DS2-VASc 2 score of greater than 3.  Continue on home dose Eliquis, stable H&H.  Avoiding nodal blocking agents due to underlying bradycardia.   Supine hypertension, currently on combination of hydralazine and Norvasc, monitor.   Chronic combined systolic and diastolic heart failure EF 40% with global hypokinesis.  Medical management only.  Hypothyroidism - Continue  levothyroxine. Recent TSH noted to be 4.7. Recommend rechecking in a few weeks.  COPD - Stable. No evidence of exacerbation.  Vascular dementia - Longstanding history of advanced vascular dementia.  On Namenda.  Hyperlipidemia - Continue statin.      Condition - Extremely Guarded  Family Communication  :    Jacinta Shoe [phone]  10/03/20 -  message left @ 10.36 am  Niece Boneta Lucks [phone] on 10/03/2020 @ 10.45 am - Voicemail not set, she called back 10/03/20 updated.  Husband Leonette Most [phone] 10/03/20 @10 .47 am - no response  Granddaughter Courtnie [phone] 10/03/20 @ 10.49 - message left   Code Status :  DNR  Consults  :  Cards,   PUD Prophylaxis : PPI   Procedures  :     CT Head - Old right frontal and occipital infarctions. No acute intracranial abnormality seen.      Disposition Plan  :    Status is: Inpatient  Remains inpatient appropriate because:IV treatments appropriate due to intensity of illness or inability to take PO   Dispo: The patient is from: Home              Anticipated d/c is to: SNF              Patient currently is not medically stable to d/c.   Difficult to place patient No  DVT Prophylaxis  :  Eliquis  Lab Results  Component Value Date   PLT 257 10/03/2020    Diet :  Diet Order            Diet Heart Room service appropriate? Yes; Fluid consistency: Thin  Diet effective now                  Inpatient Medications  Scheduled Meds:  . amiodarone  200 mg Oral Daily  . amLODipine  10 mg Oral Daily  . apixaban  5 mg Oral BID  . atorvastatin  20 mg Oral QHS  . diclofenac Sodium  2 g Topical QID  . hydrALAZINE  100 mg Oral Q8H  . levothyroxine  25 mcg Oral Q0600  . memantine  28 mg Oral Daily  . potassium chloride  20 mEq Oral Daily  . sodium chloride flush  3 mL Intravenous Q12H   Continuous Infusions: PRN Meds:.acetaminophen **OR** acetaminophen, hydrALAZINE, ipratropium, methocarbamol, [DISCONTINUED]  ondansetron **OR** ondansetron (ZOFRAN) IV  Antibiotics  :    Anti-infectives (From admission, onward)   None       Time Spent in minutes  30   10/05/2020 M.D on 10/03/2020 at 10:37 AM  To page go to [email or web address]   [facility] -  Office  [phone]    See all Orders from today for further details    Objective:   Vitals:   10/03/20 0406 10/03/20 0500 10/03/20 0716 10/03/20 0900  BP: (!) 166/75  (!) 183/80   Pulse: 64  63   Resp: 13  13   Temp: 98 F (36.7 C)  98 F (36.7 C)   TempSrc: Axillary  Axillary   SpO2: 100%  96% 98%  Weight:      Height:  6\' 2"  (1.88 m)      Wt Readings from Last 3 Encounters:  10/02/20 73.8 kg  07/14/20 73.8 kg  07/10/20 75 kg     Intake/Output Summary (Last 24 hours) at 10/03/2020 1037 Last data filed at 10/03/2020 0600 Gross per 24 hour  Intake 500 ml  Output 400 ml  Net 100 ml     Physical Exam  Awake but confused, No new F.N deficits,  [place].AT,PERRAL Supple Neck,No JVD, No cervical lymphadenopathy appriciated.  Symmetrical Chest wall movement, Good air movement bilaterally, CTAB RRR,No Gallops,Rubs or new Murmurs, No Parasternal Heave +ve B.Sounds, Abd Soft, No tenderness, No organomegaly appriciated, No rebound - guarding or rigidity. No Cyanosis, Clubbing or edema,  No new Rash or bruise      Data Review:    CBC Recent Labs  Lab 10/02/20 1115 10/03/20 0053  WBC 7.0 8.6  HGB 13.4 13.2  HCT 40.5 39.4  PLT 261 257  MCV 98.5 97.3  MCH 32.6 32.6  MCHC 33.1 33.5  RDW 13.3 13.1  LYMPHSABS 1.6  --   MONOABS 0.5  --   EOSABS 0.2  --   BASOSABS 0.1  --     Recent Labs  Lab 10/02/20 1115 10/02/20 1912 10/03/20 0053  NA 140  --  139  K 3.9  --  3.5  CL 109  --  106  CO2 23  --  26  GLUCOSE 103*  --  91  BUN 15  --  14  CREATININE 1.31*  --  0.99  CALCIUM 8.7*  --  8.8*  AST 28  --   --   ALT 14  --   --   ALKPHOS 58  --   --   BILITOT 0.9  --   --   ALBUMIN 3.5  --   --   MG  --  2.0   --    Lab Results  Component Value Date   TSH 4.750 (H) 07/12/2020    ------------------------------------------------------------------------------------------------------------------ No results for input(s): CHOL, HDL, LDLCALC, TRIG, CHOLHDL, LDLDIRECT in the last 72 hours.  Lab Results  Component Value Date   HGBA1C 5.5 07/22/2019   ------------------------------------------------------------------------------------------------------------------   Cardiac Enzymes No results for input(s): CKMB, TROPONINI, MYOGLOBIN in the last 168 hours.  Invalid input(s): CK ------------------------------------------------------------------------------------------------------------------    Component Value Date/Time   BNP 484.4 (H) 03/17/2017 1318    Micro Results No results found for this or any previous visit (from the past 240 hour(s)).  Radiology Reports CT HEAD WO CONTRAST  Result Date: 10/02/2020 CLINICAL DATA:  Altered mental status. EXAM: CT HEAD WITHOUT CONTRAST TECHNIQUE: Contiguous axial images were obtained from the base of the skull through the vertex without intravenous contrast. COMPARISON:  July 12, 2020. FINDINGS: Brain: Mild chronic ischemic white matter disease is noted. Minimal diffuse cortical atrophy is noted. Old right frontal lobe and occipital lobe infarctions are noted. No mass effect or midline shift is noted. Ventricular size is within normal limits. There is no evidence of mass lesion, hemorrhage or acute infarction. Vascular: No hyperdense vessel or unexpected calcification. Skull: Normal. Negative for fracture or focal lesion. Sinuses/Orbits: No acute finding. Other: None. IMPRESSION: Old right frontal and occipital infarctions. No acute intracranial abnormality seen. Electronically Signed   By: Lupita Raider M.D.   On: 10/02/2020 13:40   DG Chest Port 1 View  Result Date: 10/02/2020 CLINICAL DATA:  Syncopal episode. History of CHF and COPD. EXAM: PORTABLE  CHEST 1 VIEW COMPARISON:  07/12/2020 FINDINGS: Stable enlarged cardiac silhouette and tortuous and calcified thoracic aorta. The lungs remain clear with normal vascularity. Thoracic spine degenerative changes. IMPRESSION: Stable cardiomegaly. No acute abnormality. Electronically Signed   By: Beckie Salts M.D.   On: 10/02/2020 12:04   DG Abd 2 Views  Result Date: 10/02/2020 CLINICAL DATA:  Vomiting today. Recent fecal impaction. EXAM: ABDOMEN - 2 VIEW COMPARISON:  07/12/2020 FINDINGS: Normal bowel gas pattern without free peritoneal air. Normal amount of stool. Cholecystectomy clips. Left hip prosthesis. Lower thoracic spine degenerative changes and mild lumbar spine degenerative changes. IMPRESSION: No acute abnormality.  Normal amount of stool. Electronically Signed   By: Beckie Salts M.D.   On: 10/02/2020 12:48

## 2020-10-03 NOTE — Evaluation (Signed)
Physical Therapy Evaluation Patient Details Name: Wanda Mckenzie MRN: [id number] DOB: 1936/02/09 Today's Date: 10/03/2020   History of Present Illness  85 y.o. female  presented with recurrent syncope. Pt niece reports LOC ~1 min after having BM, Pt founde to have SBP 70s and O2 sat lower 80s and tachypnia. Pt confused on recover of consciousness. In ED found to have elvated BP, frecuent PVCs and occasional bradycardia. Pt admitted 10/02/20 for treatment of recurrent syncope. PMH: chronic systolic CHF (LVEF 40 to 45%), HLD, CKD stage III, vascular dementia, chronic A. fib on Eliquis, frequent PVCs on amiodarone.  Clinical Impression  Pt pleasantly confused on entry, oriented only to self. Unable to determine PLOF and home set up. Called caregiver Wanda Mckenzie, however did not answer and not able to leave a message. Pt agreeable to getting up to recliner for breakfast. Pt with Purewick in place however malfunctioned and bed saturated. Pt requires single step commands and hand over hand tactile cuing. Pt is modA for bed mobility, and transfer and maxA for stepping to recliner. Pt with no hypotension with positional change. Pt is likely at baseline level of function, but will need SNF level care if pt family is not able to offer 24 hour assist. PT will continue to follow acutely.     Follow Up Recommendations SNF (pt likely custodial placement, unable to reach niece to determine level of assist available, if pt has 24 hour home care that would be best given her level of confusion)    Equipment Recommendations  None recommended by PT    Recommendations for Other Services OT consult     Precautions / Restrictions Precautions Precautions: Fall Precaution Comments: multiple bruises possibly fall related Restrictions Weight Bearing Restrictions: No      Mobility  Bed Mobility Overal bed mobility: Needs Assistance Bed Mobility: Supine to Sit     Supine to sit: Mod assist     General bed mobility  comments: ModA for pt to pull up against therapist to come to upright, and for pad scoot to EoB    Transfers Overall transfer level: Needs assistance Equipment used: 1 person hand held assist Transfers: Sit to/from Stand Sit to Stand: Mod assist         General transfer comment: pt with good power up and modA for steadying in upright,  Ambulation/Gait Ambulation/Gait assistance: Max assist;Mod assist Gait Distance (Feet): 3 Feet Assistive device: Rolling walker (2 wheeled);1 person hand held assist Gait Pattern/deviations: Step-to pattern;Decreased step length - right;Decreased step length - left;Shuffle;Trunk flexed Gait velocity: slowed Gait velocity interpretation: <1.31 ft/sec, indicative of household ambulator General Gait Details: mod physical assist and max directional assist to have pt step from bed to recliner. Pt with no apparent knowledge of RW usage, with HHA still needs maximal cuing to step and sit        Balance Overall balance assessment: Needs assistance Sitting-balance support: Feet supported;No upper extremity supported Sitting balance-Leahy Scale: Fair     Standing balance support: Bilateral upper extremity supported;Single extremity supported;During functional activity Standing balance-Leahy Scale: Poor Standing balance comment: requires UE support                             Pertinent Vitals/Pain Pain Assessment: Faces Faces Pain Scale: Hurts a little bit Pain Location: generalized stiffness with movement Pain Descriptors / Indicators: Grimacing;Moaning Pain Intervention(s): Limited activity within patient's tolerance;Monitored during session;Repositioned    Home Living Family/patient expects to  be discharged to:: Private residence Living Arrangements: Other relatives (niece)               Additional Comments: Pt unable to provide home set up. Called pt's niece, her caregiver and she did not answer    Prior Function Level of  Independence: Needs assistance   Gait / Transfers Assistance Needed: does not have familiarity with RW  ADL's / Homemaking Assistance Needed: likely requires assist  Comments: unsure of PLOF        Extremity/Trunk Assessment   Upper Extremity Assessment Upper Extremity Assessment: Generalized weakness;Difficult to assess due to impaired cognition    Lower Extremity Assessment Lower Extremity Assessment: Generalized weakness;Difficult to assess due to impaired cognition    Cervical / Trunk Assessment Cervical / Trunk Assessment: Kyphotic  Communication   Communication: HOH  Cognition Arousal/Alertness: Awake/alert Behavior During Therapy: WFL for tasks assessed/performed Overall Cognitive Status: History of cognitive impairments - at baseline                                 General Comments: responds to name unable to provide name on command, follows one step commands with hand over hand tactile cues inconsistently, requires redirection      General Comments General comments (skin integrity, edema, etc.): VSS on RA        Assessment/Plan    PT Assessment Patient needs continued PT services  PT Problem List Decreased strength;Decreased activity tolerance;Decreased balance;Decreased mobility;Decreased cognition;Decreased safety awareness;Decreased knowledge of use of DME;Decreased knowledge of precautions       PT Treatment Interventions DME instruction;Gait training;Stair training    PT Goals (Current goals can be found in the Care Plan section)  Acute Rehab PT Goals Patient Stated Goal: none state PT Goal Formulation: Patient unable to participate in goal setting Time For Goal Achievement: 10/17/20 Potential to Achieve Goals: Fair    Frequency Min 2X/week    AM-PAC PT "6 Clicks" Mobility  Outcome Measure Help needed turning from your back to your side while in a flat bed without using bedrails?: None Help needed moving from lying on your back  to sitting on the side of a flat bed without using bedrails?: A Lot Help needed moving to and from a bed to a chair (including a wheelchair)?: A Lot Help needed standing up from a chair using your arms (e.g., wheelchair or bedside chair)?: A Lot Help needed to walk in hospital room?: A Lot Help needed climbing 3-5 steps with a railing? : Total 6 Click Score: 13    End of Session Equipment Utilized During Treatment: Gait belt Activity Tolerance: Patient tolerated treatment well Patient left: in chair;with call bell/phone within reach;with chair alarm set;Other (comment) (NT notified of Purewick malfunction) Nurse Communication: Mobility status PT Visit Diagnosis: Unsteadiness on feet (R26.81);Other abnormalities of gait and mobility (R26.89);Muscle weakness (generalized) (M62.81);Difficulty in walking, not elsewhere classified (R26.2)    Time: 2831-5176 PT Time Calculation (min) (ACUTE ONLY): 27 min   Charges:   PT Evaluation $PT Eval Moderate Complexity: 1 Mod PT Treatments $Therapeutic Activity: 8-22 mins        Wanda Lizana B. Beverely Risen PT, DPT Acute Rehabilitation Services Pager [phone] Office [phone]   Elon Alas Fleet 10/03/2020, 10:15 AM

## 2020-10-04 DIAGNOSIS — R55 Syncope and collapse: Secondary | ICD-10-CM | POA: Diagnosis not present

## 2020-10-04 LAB — GLUCOSE, CAPILLARY: Glucose-Capillary: 99 mg/dL (ref 70–99)

## 2020-10-04 MED ORDER — LISINOPRIL 5 MG PO TABS
5.0000 mg | ORAL_TABLET | Freq: Every day | ORAL | Status: DC
  Administered 2020-10-04 – 2020-10-05 (×2): 5 mg via ORAL
  Filled 2020-10-04 (×3): qty 1

## 2020-10-04 NOTE — Progress Notes (Signed)
PROGRESS NOTE                                                                                                                                                                                                             Patient Demographics:    Wanda Mckenzie, is a 85 y.o. female, DOB - 1935-11-01, YSA:[id number]  Outpatient Primary MD for the patient is [name], FNP    LOS - 2  Admit date - 10/02/2020    Chief Complaint  Patient presents with  . Loss of Consciousness       Brief Narrative (HPI from H&P)  Wanda Mckenzie is a 85 y.o. female with medical history significant of chronic systolic CHF (LVEF 40 to 45%), HLD, CKD stage III, vascular dementia, chronic A. fib on Eliquis, frequent PVCs on amiodarone, presented with syncope while sitting on the chair, was noted to be hypotensive at that time as well, recently on a previous admission she had a similar issue and was found to be orthostatic.   Subjective:   Patient in bed appears to be in no distress but pleasantly confused, denies any headache chest or abdominal pain, no shortness of breath.   Assessment  & Plan :   Recurrent syncope  - while having a BM, likely vasovagal/orthostatic etiology, BP was low at that time, seen by Cards not a candidate for AICD/PPM, hydrated, TEDs, PT, if remains stable for another 24 hours likely discharge back home with home health PT.  Borderline bradycardia - As above, avoid nodal blocking agents.  QTC prolongation - resolved on repeat EKG .  AKI -  Resolved after, holding Lasix  & gentle IVF.  Reintroduce low-dose lisinopril and monitor.  Chronic A. Fib -CHA2DS2-VASc 2 score of greater than 3.  Continue on home dose Eliquis, stable H&H.  Avoiding nodal blocking agents due to underlying bradycardia.   Supine hypertension, currently on combination of hydralazine, low-dose ACE inhibitor and Norvasc, monitor.   Chronic  combined systolic and diastolic heart failure EF 40% with global hypokinesis.  Medical management only.  Hypothyroidism - Continue levothyroxine. Recent TSH noted to be 4.7. Recommend rechecking in a few weeks.  COPD - Stable. No evidence of exacerbation.  Vascular dementia - Longstanding history of advanced vascular dementia.  On Namenda.  Hyperlipidemia - Continue statin.  Condition - Extremely Guarded  Family Communication  :    Jacinta Shoe [phone]  10/03/20 -  message left @ 10.36 am  Niece Boneta Lucks [phone] on 10/03/2020 @ 10.45 am - Voicemail not set, she called back 10/03/20 updated.  Husband Leonette Most [phone] 10/03/20 @10 .47 am - no response  Granddaughter Courtnie [phone] 10/03/20 @ 10.49 - message left   Code Status :  DNR  Consults  :  Cards,   PUD Prophylaxis : PPI   Procedures  :     CT Head - Old right frontal and occipital infarctions. No acute intracranial abnormality seen.      Disposition Plan  :    Status is: Inpatient  Remains inpatient appropriate because:IV treatments appropriate due to intensity of illness or inability to take PO   Dispo: The patient is from: Home              Anticipated d/c is to: SNF              Patient currently is not medically stable to d/c.   Difficult to place patient No  DVT Prophylaxis  :  Eliquis  Lab Results  Component Value Date   PLT 257 10/03/2020    Diet :  Diet Order            Diet Heart Room service appropriate? Yes; Fluid consistency: Thin  Diet effective now                  Inpatient Medications  Scheduled Meds:  . amiodarone  200 mg Oral Daily  . amLODipine  10 mg Oral Daily  . apixaban  5 mg Oral BID  . atorvastatin  20 mg Oral QHS  . diclofenac Sodium  2 g Topical QID  . hydrALAZINE  100 mg Oral Q8H  . levothyroxine  25 mcg Oral Q0600  . lisinopril  5 mg Oral Daily  . memantine  28 mg Oral Daily  . pantoprazole  40 mg Oral Daily  . potassium chloride  20  mEq Oral Daily  . sodium chloride flush  3 mL Intravenous Q12H   Continuous Infusions: PRN Meds:.acetaminophen **OR** acetaminophen, hydrALAZINE, ipratropium, methocarbamol, [DISCONTINUED] ondansetron **OR** ondansetron (ZOFRAN) IV  Antibiotics  :    Anti-infectives (From admission, onward)   None       Time Spent in minutes  30   10/05/2020 M.D on 10/04/2020 at 9:42 AM  To page go to [email or web address]   [facility] -  Office  [phone]    See all Orders from today for further details    Objective:   Vitals:   10/04/20 0348 10/04/20 0558 10/04/20 0650 10/04/20 0721  BP: (!) 194/60 (!) 152/64  (!) 161/53  Pulse: 71   75  Resp: 18   15  Temp: 98 F (36.7 C)   98 F (36.7 C)  TempSrc: Axillary   Axillary  SpO2: 98%   100%  Weight:   70.9 kg   Height:        Wt Readings from Last 3 Encounters:  10/04/20 70.9 kg  07/14/20 73.8 kg  07/10/20 75 kg     Intake/Output Summary (Last 24 hours) at 10/04/2020 0942 Last data filed at 10/04/2020 0600 Gross per 24 hour  Intake --  Output 300 ml  Net -300 ml     Physical Exam  Awake but mildly confused, No new F.N deficits,   [place].AT,PERRAL Supple Neck,No JVD, No cervical lymphadenopathy appriciated.  Symmetrical Chest  wall movement, Good air movement bilaterally, CTAB RRR,No Gallops, Rubs or new Murmurs, No Parasternal Heave +ve B.Sounds, Abd Soft, No tenderness, No organomegaly appriciated, No rebound - guarding or rigidity. No Cyanosis, Clubbing or edema, No new Rash or bruise     Data Review:    CBC Recent Labs  Lab 10/02/20 1115 10/03/20 0053  WBC 7.0 8.6  HGB 13.4 13.2  HCT 40.5 39.4  PLT 261 257  MCV 98.5 97.3  MCH 32.6 32.6  MCHC 33.1 33.5  RDW 13.3 13.1  LYMPHSABS 1.6  --   MONOABS 0.5  --   EOSABS 0.2  --   BASOSABS 0.1  --     Recent Labs  Lab 10/02/20 1115 10/02/20 1912 10/03/20 0053  NA 140  --  139  K 3.9  --  3.5  CL 109  --  106  CO2 23  --  26  GLUCOSE 103*  --   91  BUN 15  --  14  CREATININE 1.31*  --  0.99  CALCIUM 8.7*  --  8.8*  AST 28  --   --   ALT 14  --   --   ALKPHOS 58  --   --   BILITOT 0.9  --   --   ALBUMIN 3.5  --   --   MG  --  2.0  --    Lab Results  Component Value Date   TSH 4.750 (H) 07/12/2020    ------------------------------------------------------------------------------------------------------------------ No results for input(s): CHOL, HDL, LDLCALC, TRIG, CHOLHDL, LDLDIRECT in the last 72 hours.  Lab Results  Component Value Date   HGBA1C 5.5 07/22/2019   ------------------------------------------------------------------------------------------------------------------   Cardiac Enzymes No results for input(s): CKMB, TROPONINI, MYOGLOBIN in the last 168 hours.  Invalid input(s): CK ------------------------------------------------------------------------------------------------------------------    Component Value Date/Time   BNP 484.4 (H) 03/17/2017 1318    Micro Results No results found for this or any previous visit (from the past 240 hour(s)).  Radiology Reports CT HEAD WO CONTRAST  Result Date: 10/02/2020 CLINICAL DATA:  Altered mental status. EXAM: CT HEAD WITHOUT CONTRAST TECHNIQUE: Contiguous axial images were obtained from the base of the skull through the vertex without intravenous contrast. COMPARISON:  July 12, 2020. FINDINGS: Brain: Mild chronic ischemic white matter disease is noted. Minimal diffuse cortical atrophy is noted. Old right frontal lobe and occipital lobe infarctions are noted. No mass effect or midline shift is noted. Ventricular size is within normal limits. There is no evidence of mass lesion, hemorrhage or acute infarction. Vascular: No hyperdense vessel or unexpected calcification. Skull: Normal. Negative for fracture or focal lesion. Sinuses/Orbits: No acute finding. Other: None. IMPRESSION: Old right frontal and occipital infarctions. No acute intracranial abnormality seen.  Electronically Signed   By: Lupita Raider M.D.   On: 10/02/2020 13:40   DG Chest Port 1 View  Result Date: 10/02/2020 CLINICAL DATA:  Syncopal episode. History of CHF and COPD. EXAM: PORTABLE CHEST 1 VIEW COMPARISON:  07/12/2020 FINDINGS: Stable enlarged cardiac silhouette and tortuous and calcified thoracic aorta. The lungs remain clear with normal vascularity. Thoracic spine degenerative changes. IMPRESSION: Stable cardiomegaly. No acute abnormality. Electronically Signed   By: Beckie Salts M.D.   On: 10/02/2020 12:04   DG Abd 2 Views  Result Date: 10/02/2020 CLINICAL DATA:  Vomiting today. Recent fecal impaction. EXAM: ABDOMEN - 2 VIEW COMPARISON:  07/12/2020 FINDINGS: Normal bowel gas pattern without free peritoneal air. Normal amount of stool. Cholecystectomy clips. Left hip prosthesis.  Lower thoracic spine degenerative changes and mild lumbar spine degenerative changes. IMPRESSION: No acute abnormality.  Normal amount of stool. Electronically Signed   By: Beckie Salts M.D.   On: 10/02/2020 12:48

## 2020-10-04 NOTE — Evaluation (Signed)
Occupational Therapy Evaluation/Discharge Patient Details Name: Wanda Mckenzie MRN: [id number] DOB: 08-18-1935 Today's Date: 10/04/2020    History of Present Illness 85 y.o. female  presented with recurrent syncope. Pt niece reports LOC ~1 min after having BM, Pt founde to have SBP 70s and O2 sat lower 80s and tachypnia. Pt confused on recover of consciousness. In ED found to have elvated BP, frecuent PVCs and occasional bradycardia. Pt admitted 10/02/20 for treatment of recurrent syncope. PMH: chronic systolic CHF (LVEF 40 to 45%), HLD, CKD stage III, vascular dementia, chronic A. fib on Eliquis, frequent PVCs on amiodarone.   Clinical Impression   PTA, pt lives with family per chart. Pt unable to report current previous functional abilities for ADLs or transfers as pt A&OX1. Pt presents with diagnoses above and primarily limited by cognitive deficits. Pt pleasantly confused, humming "itsy bitsy spider" throughout session. Pt unable to follow one step commands consistently despite multimodal cues and hand over hand assist. Pt Total A for bed mobility to sit EOB, but able to maintain sitting balance without support. Unable to attempt standing due to pt inability to follow directions. Pt requires Max A for simple grooming tasks and self feeding, Total A for all other ADLs. Pt likely will do better in home environment if family able to provide the 24/7 physical assist that pt requires. If family unable to provide this level of care, recommend SNF placement - more for long term care rather than short term rehab. Due to inconsistent following of commands which limits progress, will sign off at acute level. Please reconsult if needs change.     Follow Up Recommendations  SNF;Supervision/Assistance - 24 hour (Or home with 24/7 physical assist from family if they are able to provide required level of care)    Equipment Recommendations  Hospital bed;Other (comment) (hoyer lift)    Recommendations for  Other Services       Precautions / Restrictions Precautions Precautions: Fall Precaution Comments: multiple bruises possibly fall related Restrictions Weight Bearing Restrictions: No      Mobility Bed Mobility Overal bed mobility: Needs Assistance Bed Mobility: Supine to Sit;Sit to Supine     Supine to sit: Total assist;HOB elevated Sit to supine: Total assist   General bed mobility comments: Total A for all bed mobility, despite multimodal cues - no intiiation of task. Able to maintain sitting balance once EOB    Transfers                 General transfer comment: unable to follow directions to attempt standing at bedside. Placed patient's hands behind therapist elbow for manual standing but pt bent head forward and kissed therapist's arm    Balance Overall balance assessment: Needs assistance Sitting-balance support: Feet supported;No upper extremity supported Sitting balance-Leahy Scale: Fair Sitting balance - Comments: static sitting > 8 min without UE support                                   ADL either performed or assessed with clinical judgement   ADL Overall ADL's : Needs assistance/impaired;At baseline Eating/Feeding: Maximal assistance;Sitting Eating/Feeding Details (indicate cue type and reason): Max A to drink from cup sitting EOB Grooming: Maximal assistance;Sitting Grooming Details (indicate cue type and reason): Able to brush hair siting EOB with Mod A. Total A for wash face with hand over hand assist Upper Body Bathing: Total assistance;Sitting   Lower Body Bathing: Total  assistance;Bed level   Upper Body Dressing : Total assistance;Sitting   Lower Body Dressing: Total assistance;Bed level       Toileting- Clothing Manipulation and Hygiene: Total assistance;Bed level         General ADL Comments: Extensive assist for ADLs due to dementia, inability to follow one step commands consistently     Vision Patient Visual  Report: No change from baseline Vision Assessment?: No apparent visual deficits     Perception     Praxis      Pertinent Vitals/Pain Pain Assessment: Faces Faces Pain Scale: Hurts a little bit Pain Location: generalized stiffness with movement Pain Descriptors / Indicators: Grimacing;Moaning Pain Intervention(s): Monitored during session;Repositioned;Limited activity within patient's tolerance     Hand Dominance Right   Extremity/Trunk Assessment Upper Extremity Assessment Upper Extremity Assessment: Generalized weakness   Lower Extremity Assessment Lower Extremity Assessment: Defer to PT evaluation   Cervical / Trunk Assessment Cervical / Trunk Assessment: Kyphotic   Communication Communication Communication: HOH   Cognition Arousal/Alertness: Awake/alert Behavior During Therapy: WFL for tasks assessed/performed;Restless;Flat affect Overall Cognitive Status: History of cognitive impairments - at baseline Area of Impairment: Orientation;Attention;Memory;Following commands;Safety/judgement;Awareness;Problem solving                 Orientation Level: Disoriented to;Place;Time;Situation Current Attention Level: Focused Memory: Decreased short-term memory Following Commands: Follows one step commands inconsistently;Follows multi-step commands inconsistently Safety/Judgement: Decreased awareness of safety;Decreased awareness of deficits Awareness: Intellectual Problem Solving: Slow processing;Decreased initiation;Difficulty sequencing;Requires verbal cues;Requires tactile cues General Comments: A&Ox1, responds to name. Inconsistent following of commands despite multimodal cues and hand over hand assist. Poor attention to tasks, humming songs throughout session. Did point to floor at one instance, saying "hello" - hallucination?   General Comments  Systolic BP elevated (180s) though BP cuff on R LE.    Exercises     Shoulder Instructions      Home Living  Family/patient expects to be discharged to:: Private residence Living Arrangements: Other relatives (niece)                               Additional Comments: Pt unable to provide home set up. Per last admission chart review, pt's son reports family able to provide 24/7 assist      Prior Functioning/Environment Level of Independence: Needs assistance  Gait / Transfers Assistance Needed: does not have familiarity with RW ADL's / Homemaking Assistance Needed: likely requires assist   Comments: unsure of PLOF as pt poor historian. A&Ox1        OT Problem List: Decreased strength;Decreased activity tolerance;Impaired balance (sitting and/or standing);Decreased coordination;Decreased cognition;Decreased safety awareness      OT Treatment/Interventions:      OT Goals(Current goals can be found in the care plan section) Acute Rehab OT Goals Patient Stated Goal: none stated  OT Frequency:     Barriers to D/C:            Co-evaluation              AM-PAC OT "6 Clicks" Daily Activity     Outcome Measure Help from another person eating meals?: A Lot Help from another person taking care of personal grooming?: A Lot Help from another person toileting, which includes using toliet, bedpan, or urinal?: Total Help from another person bathing (including washing, rinsing, drying)?: Total Help from another person to put on and taking off regular upper body clothing?: Total Help from another person to  put on and taking off regular lower body clothing?: Total 6 Click Score: 8   End of Session Equipment Utilized During Treatment: Gait belt Nurse Communication: Mobility status;Other (comment) (cognition)  Activity Tolerance: Other (comment) (limited by cognition) Patient left: in bed;with call bell/phone within reach;with bed alarm set  OT Visit Diagnosis: Unsteadiness on feet (R26.81);Other abnormalities of gait and mobility (R26.89);Muscle weakness (generalized)  (M62.81);Other symptoms and signs involving cognitive function;Feeding difficulties (R63.3);History of falling (Z91.81);Cognitive communication deficit (R41.841)                Time: 1751-0258 OT Time Calculation (min): 23 min Charges:  OT General Charges $OT Visit: 1 Visit OT Evaluation $OT Eval Moderate Complexity: 1 Mod OT Treatments $Self Care/Home Management : 8-22 mins  Bradd Canary, OTR/L Acute Rehab Services Office: [phone]  Lorre Munroe 10/04/2020, 10:25 AM

## 2020-10-05 DIAGNOSIS — R55 Syncope and collapse: Secondary | ICD-10-CM | POA: Diagnosis not present

## 2020-10-05 LAB — GLUCOSE, CAPILLARY: Glucose-Capillary: 104 mg/dL — ABNORMAL HIGH (ref 70–99)

## 2020-10-05 MED ORDER — HYDRALAZINE HCL 50 MG PO TABS
50.0000 mg | ORAL_TABLET | Freq: Three times a day (TID) | ORAL | Status: DC
  Administered 2020-10-05 – 2020-10-06 (×2): 50 mg via ORAL
  Filled 2020-10-05 (×3): qty 1

## 2020-10-05 MED ORDER — PYRIDOSTIGMINE BROMIDE 60 MG PO TABS
60.0000 mg | ORAL_TABLET | Freq: Two times a day (BID) | ORAL | Status: DC
  Administered 2020-10-05 (×2): 60 mg via ORAL
  Filled 2020-10-05 (×5): qty 1

## 2020-10-05 NOTE — Progress Notes (Signed)
Physical Therapy Treatment Patient Details Name: Wanda Mckenzie MRN: [id number] DOB: 1936/06/09 Today's Date: 10/05/2020    History of Present Illness 85 y.o. female  presented with recurrent syncope. Pt niece reports LOC ~1 min after having BM, Pt founde to have SBP 70s and O2 sat lower 80s and tachypnia. Pt confused on recover of consciousness. In ED found to have elvated BP, frecuent PVCs and occasional bradycardia. Pt admitted 10/02/20 for treatment of recurrent syncope. PMH: chronic systolic CHF (LVEF 40 to 45%), HLD, CKD stage III, vascular dementia, chronic A. fib on Eliquis, frequent PVCs on amiodarone.    PT Comments    Pt supine in bed with breakfast tray untouched on table beside her. Pt able to initiate LE towards EoB but then becomes distracted and needs total Ax2 for coming to seated EoB. Once in seated pt with very steady static sitting, commenting on houses outside the window. Attempted to set up breakfast and provide hand over hand assist for eating, however pt not participatory. Pt did eat food presented to lips. Once pt stopped accepting food she began to complain of back pain. Pt able to bring trunk down to bed surface but requires totalAx2 for LE management and straightening in bed. Pt scheduled for d/c home today.     Follow Up Recommendations  Home health PT;Supervision/Assistance - 24 hour (son reports niece provides assist when son is at work)     Furniture conservator/restorer  Other (comment) (son reports all necessary DME)       Precautions / Restrictions Precautions Precautions: Fall Precaution Comments: multiple bruises possibly fall related Restrictions Weight Bearing Restrictions: No    Mobility  Bed Mobility Overal bed mobility: Needs Assistance Bed Mobility: Supine to Sit;Sit to Supine     Supine to sit: Total assist;HOB elevated;+2 for physical assistance Sit to supine: Total assist;+2 for physical assistance   General bed mobility comments: Total A  for coming to EoB, pt does initiate movement of LE towards EoB but then stops, and with return to supine pt leans trunk down but requires total A for return of LE to bed    Transfers                 General transfer comment: unable to follow directions to attempt standing at bedside. Placed patient's hands behind therapist elbow for manual standing but pt bent head forward and kissed therapist's arm      Balance Overall balance assessment: Needs assistance Sitting-balance support: Feet supported;No upper extremity supported Sitting balance-Leahy Scale: Fair Sitting balance - Comments: static sitting > 8 min without UE support, not able to feed herself however does eat eggs when presented to her lips                                    Cognition Arousal/Alertness: Awake/alert Behavior During Therapy: WFL for tasks assessed/performed;Restless;Flat affect Overall Cognitive Status: History of cognitive impairments - at baseline Area of Impairment: Orientation;Attention;Memory;Following commands;Safety/judgement;Awareness;Problem solving                 Orientation Level: Disoriented to;Place;Time;Situation Current Attention Level: Focused Memory: Decreased short-term memory Following Commands: Follows one step commands inconsistently;Follows multi-step commands inconsistently Safety/Judgement: Decreased awareness of safety;Decreased awareness of deficits Awareness: Intellectual Problem Solving: Slow processing;Decreased initiation;Difficulty sequencing;Requires verbal cues;Requires tactile cues General Comments: A&Ox1, responds to name. Inconsistent following of commands despite multimodal cues and hand over hand assist. Poor  attention to task and follow through to task completion.         General Comments General comments (skin integrity, edema, etc.): VSS on RA      Pertinent Vitals/Pain Pain Assessment: Faces Faces Pain Scale: Hurts a little bit Pain  Location: generalized stiffness with movement Pain Descriptors / Indicators: Grimacing;Moaning Pain Intervention(s): Limited activity within patient's tolerance;Monitored during session;Patient requesting pain meds-RN notified           PT Goals (current goals can now be found in the care plan section) Acute Rehab PT Goals Patient Stated Goal: none stated PT Goal Formulation: Patient unable to participate in goal setting Time For Goal Achievement: 10/17/20 Potential to Achieve Goals: Fair Progress towards PT goals: Progressing toward goals    Frequency    Min 3X/week      PT Plan Current plan remains appropriate       AM-PAC PT "6 Clicks" Mobility   Outcome Measure  Help needed turning from your back to your side while in a flat bed without using bedrails?: None Help needed moving from lying on your back to sitting on the side of a flat bed without using bedrails?: Total Help needed moving to and from a bed to a chair (including a wheelchair)?: Total Help needed standing up from a chair using your arms (e.g., wheelchair or bedside chair)?: Total Help needed to walk in hospital room?: Total Help needed climbing 3-5 steps with a railing? : Total 6 Click Score: 9    End of Session   Activity Tolerance: Patient tolerated treatment well Patient left: in bed;with call bell/phone within reach;with bed alarm set Nurse Communication: Mobility status PT Visit Diagnosis: Unsteadiness on feet (R26.81);Other abnormalities of gait and mobility (R26.89);Muscle weakness (generalized) (M62.81);Difficulty in walking, not elsewhere classified (R26.2)     Time: 7035-0093 PT Time Calculation (min) (ACUTE ONLY): 30 min  Charges:  $Therapeutic Activity: 8-22 mins $Self Care/Home Management: 8-22                     Cerys Winget B. Beverely Risen PT, DPT Acute Rehabilitation Services Pager [phone] Office [phone]    Elon Alas Fleet 10/05/2020, 10:19 AM

## 2020-10-05 NOTE — Care Management Important Message (Signed)
Important Message  Patient Details  Name: Wanda Mckenzie MRN: [id number] Date of Birth: 1935/12/07   Medicare Important Message Given:  Yes - Important Message mailed due to current National Emergency   Verbal consent obtained due to current National Emergency  Relationship to patient: Self Contact Name: Veronique Call Date: 10/05/20  Time: 1326 Phone: [phone] Outcome: No Answer/Busy Important Message mailed to: Patient address on file    Orson Aloe 10/05/2020, 1:27 PM

## 2020-10-05 NOTE — Progress Notes (Addendum)
PROGRESS NOTE                                                                                                                                                                                                             Patient Demographics:    Wanda Mckenzie, is a 85 y.o. female, DOB - 1935-12-04, TGG:[id number]  Outpatient Primary MD for the patient is [name], FNP    LOS - 3  Admit date - 10/02/2020    Chief Complaint  Patient presents with  . Loss of Consciousness       Brief Narrative (HPI from H&P)  Wanda Mckenzie is a 85 y.o. female with medical history significant of chronic systolic CHF (LVEF 40 to 45%), HLD, CKD stage III, vascular dementia, chronic A. fib on Eliquis, frequent PVCs on amiodarone, presented with syncope while sitting on the chair, was noted to be hypotensive at that time as well, recently on a previous admission she had a similar issue and was found to be orthostatic.   Subjective:   Patient in bed, confused but appears comfortable, denies any headache, no fever, no chest pain , no shortness of breath , no abdominal pain.     Assessment  & Plan :   Recurrent syncope  - while having a BM, likely vasovagal/orthostatic etiology, BP was low at that time, seen by Cards not a candidate for AICD/PPM, hydrated, TEDs, PT, if remains stable for another 24 hours likely discharge back home with home health PT. She likely has supine hypertension with orthostatic hypotension due to reduced autonomic tone, will add twice daily Mestinon and monitor.  Borderline bradycardia - As above, avoid nodal blocking agents.  QTC prolongation - resolved on repeat EKG .  AKI -  Resolved after, holding Lasix  & gentle IVF.  Reintroduce low-dose lisinopril and monitor.  Chronic A. Fib - CHA2DS2-VASc 2 score of greater than 3.  Continue on home dose Eliquis, stable H&H.  Avoiding nodal blocking agents due to  underlying bradycardia.   Supine hypertension, currently on combination of hydralazine, low-dose ACE inhibitor and Norvasc, monitor.   Chronic combined systolic and diastolic heart failure EF 40% with global hypokinesis.  Medical management only.  Hypothyroidism - Continue levothyroxine. Recent TSH noted to be 4.7. Recommend rechecking in a few weeks.  COPD - Stable. No evidence  of exacerbation.  Vascular dementia - Longstanding history of advanced vascular dementia.  On Namenda.  Hyperlipidemia - Continue statin.      Condition - Extremely Guarded  Family Communication  :    Jacinta Shoe [phone]  10/03/20 -  message left @ 10.36 am  Niece Boneta Lucks [phone] on 10/03/2020 @ 10.45 am - Voicemail not set, she called back 10/03/20 updated, 10/05/20  Husband Leonette Most [phone] 10/03/20 @10 .47 am - no response  Granddaughter Courtnie [phone] 10/03/20 @ 10.49 - message left   Code Status :  DNR  Consults  :  Cards,   PUD Prophylaxis : PPI   Procedures  :     CT Head - Old right frontal and occipital infarctions. No acute intracranial abnormality seen.      Disposition Plan  :    Status is: Inpatient  Remains inpatient appropriate because:IV treatments appropriate due to intensity of illness or inability to take PO   Dispo: The patient is from: Home              Anticipated d/c is to: SNF              Patient currently is not medically stable to d/c.   Difficult to place patient No  DVT Prophylaxis  :  Eliquis  Lab Results  Component Value Date   PLT 257 10/03/2020    Diet :  Diet Order            Diet Heart Room service appropriate? No; Fluid consistency: Thin  Diet effective now                  Inpatient Medications  Scheduled Meds:  . amiodarone  200 mg Oral Daily  . amLODipine  10 mg Oral Daily  . apixaban  5 mg Oral BID  . atorvastatin  20 mg Oral QHS  . diclofenac Sodium  2 g Topical QID  . hydrALAZINE  50 mg Oral Q8H  .  levothyroxine  25 mcg Oral Q0600  . lisinopril  5 mg Oral Daily  . memantine  28 mg Oral Daily  . pantoprazole  40 mg Oral Daily  . potassium chloride  20 mEq Oral Daily  . sodium chloride flush  3 mL Intravenous Q12H   Continuous Infusions: PRN Meds:.acetaminophen **OR** acetaminophen, hydrALAZINE, ipratropium, methocarbamol, [DISCONTINUED] ondansetron **OR** ondansetron (ZOFRAN) IV  Antibiotics  :    Anti-infectives (From admission, onward)   None       Time Spent in minutes  30   10/05/2020 M.D on 10/05/2020 at 11:20 AM  To page go to [email or web address]   [facility] -  Office  [phone]    See all Orders from today for further details    Objective:   Vitals:   10/04/20 2345 10/05/20 0320 10/05/20 0500 10/05/20 0724  BP: (!) 149/59 (!) 102/52  138/62  Pulse: 65 70  69  Resp: 15 15  17   Temp: 97.6 F (36.4 C) 98.2 F (36.8 C)  98.9 F (37.2 C)  TempSrc: Axillary Axillary  Oral  SpO2: 94% 93%  98%  Weight:   71 kg   Height:        Wt Readings from Last 3 Encounters:  10/05/20 71 kg  07/14/20 73.8 kg  07/10/20 75 kg    No intake or output data in the 24 hours ending 10/05/20 1120   Physical Exam  Awake, but confused, No new F.N deficits, Normal affect [place].AT,PERRAL Supple Neck,No JVD,  No cervical lymphadenopathy appriciated.  Symmetrical Chest wall movement, Good air movement bilaterally, CTAB RRR,No Gallops, Rubs or new Murmurs, No Parasternal Heave +ve B.Sounds, Abd Soft, No tenderness, No organomegaly appriciated, No rebound - guarding or rigidity. No Cyanosis, Clubbing or edema, No new Rash or bruise     Data Review:    CBC Recent Labs  Lab 10/02/20 1115 10/03/20 0053  WBC 7.0 8.6  HGB 13.4 13.2  HCT 40.5 39.4  PLT 261 257  MCV 98.5 97.3  MCH 32.6 32.6  MCHC 33.1 33.5  RDW 13.3 13.1  LYMPHSABS 1.6  --   MONOABS 0.5  --   EOSABS 0.2  --   BASOSABS 0.1  --     Recent Labs  Lab 10/02/20 1115 10/02/20 1912  10/03/20 0053  NA 140  --  139  K 3.9  --  3.5  CL 109  --  106  CO2 23  --  26  GLUCOSE 103*  --  91  BUN 15  --  14  CREATININE 1.31*  --  0.99  CALCIUM 8.7*  --  8.8*  AST 28  --   --   ALT 14  --   --   ALKPHOS 58  --   --   BILITOT 0.9  --   --   ALBUMIN 3.5  --   --   MG  --  2.0  --    Lab Results  Component Value Date   TSH 4.750 (H) 07/12/2020    ------------------------------------------------------------------------------------------------------------------ No results for input(s): CHOL, HDL, LDLCALC, TRIG, CHOLHDL, LDLDIRECT in the last 72 hours.  Lab Results  Component Value Date   HGBA1C 5.5 07/22/2019   ------------------------------------------------------------------------------------------------------------------   Cardiac Enzymes No results for input(s): CKMB, TROPONINI, MYOGLOBIN in the last 168 hours.  Invalid input(s): CK ------------------------------------------------------------------------------------------------------------------    Component Value Date/Time   BNP 484.4 (H) 03/17/2017 1318    Micro Results No results found for this or any previous visit (from the past 240 hour(s)).  Radiology Reports CT HEAD WO CONTRAST  Result Date: 10/02/2020 CLINICAL DATA:  Altered mental status. EXAM: CT HEAD WITHOUT CONTRAST TECHNIQUE: Contiguous axial images were obtained from the base of the skull through the vertex without intravenous contrast. COMPARISON:  July 12, 2020. FINDINGS: Brain: Mild chronic ischemic white matter disease is noted. Minimal diffuse cortical atrophy is noted. Old right frontal lobe and occipital lobe infarctions are noted. No mass effect or midline shift is noted. Ventricular size is within normal limits. There is no evidence of mass lesion, hemorrhage or acute infarction. Vascular: No hyperdense vessel or unexpected calcification. Skull: Normal. Negative for fracture or focal lesion. Sinuses/Orbits: No acute finding. Other:  None. IMPRESSION: Old right frontal and occipital infarctions. No acute intracranial abnormality seen. Electronically Signed   By: Lupita Raider M.D.   On: 10/02/2020 13:40   DG Chest Port 1 View  Result Date: 10/02/2020 CLINICAL DATA:  Syncopal episode. History of CHF and COPD. EXAM: PORTABLE CHEST 1 VIEW COMPARISON:  07/12/2020 FINDINGS: Stable enlarged cardiac silhouette and tortuous and calcified thoracic aorta. The lungs remain clear with normal vascularity. Thoracic spine degenerative changes. IMPRESSION: Stable cardiomegaly. No acute abnormality. Electronically Signed   By: Beckie Salts M.D.   On: 10/02/2020 12:04   DG Abd 2 Views  Result Date: 10/02/2020 CLINICAL DATA:  Vomiting today. Recent fecal impaction. EXAM: ABDOMEN - 2 VIEW COMPARISON:  07/12/2020 FINDINGS: Normal bowel gas pattern without free peritoneal air. Normal amount  of stool. Cholecystectomy clips. Left hip prosthesis. Lower thoracic spine degenerative changes and mild lumbar spine degenerative changes. IMPRESSION: No acute abnormality.  Normal amount of stool. Electronically Signed   By: Beckie Salts M.D.   On: 10/02/2020 12:48

## 2020-10-05 NOTE — TOC Initial Note (Signed)
Transition of Care [facility]) - Initial/Assessment Note    Patient Details  Name: Wanda Mckenzie MRN: [id number] Date of Birth: 08-06-1935  [facility] CM/SW Contact:    Janae Bridgeman, RN Phone Number: 10/05/2020, 8:29 AM  Clinical Narrative:                 Case management spoke with the patient's son, Leonette Most, on the phone this morning to discuss discharge plans for home.  I mentioned to the son that the patient has discharge plans for home today.  The son confirmed that the patient will have 24 hour supervision at home with family members, Boneta Lucks (niece) and son Leonette Most).  Leonette Most, son states that the niece provides care at the home while he is at work.  Current dme at the home includes electric left bed, wheelchair and rolling walker.  The son states that they have available dme at home and have no needs for further equipment for home.  The family plans to take the patient home by car upon discharge from the hospital.  I offered the son, Leonette Most, choice regarding home health services and he was agreeable to have [facility] return for home health services.  I called and spoke with Arline Asp, CM with [facility] and they will provide RN, PT, and MSW starting 10/08/2020 after the holiday weekend.  CM and MSW will continue to follow the patient for discharge needs for home and patient may be discharged to home once physician orders are available.  Please call the family to contact for discharge instructions and transportation home by family car.  Expected Discharge Plan: Home w Home Health Services Barriers to Discharge: No Barriers Identified   Patient Goals and CMS Choice Patient states their goals for this hospitalization and ongoing recovery are:: Family would like the patient to return home once she is medically stable. Family able to provide 24 hour supervision. CMS Medicare.gov Compare Post Acute Care list provided to:: Patient Represenative (must comment) (Patient's son,  Leonette Most.) Choice offered to / list presented to : Adult Children  Expected Discharge Plan and Services Expected Discharge Plan: Home w Home Health Services In-house Referral: Clinical Social Work Discharge Planning Services: CM Consult Post Acute Care Choice: Home Health Living arrangements for the past 2 months: Single Family Home Expected Discharge Date: 10/05/20               DME Arranged:  (Patient has current electric bed, wheelchair, and RW at home.)         [facility] Arranged: RN,PT,Social Work [employer] Agency: [facility] Date [facility] Agency Contacted: 10/05/20 Time HH Agency Contacted: [phone] Representative spoke with at [facility] Agency: Arline Asp, CM with [facility]  Prior Living Arrangements/Services Living arrangements for the past 2 months: Single Family Home Lives with:: Adult Children,Spouse Patient language and need for interpreter reviewed:: Yes Do you feel safe going back to the place where you live?: Yes      Need for Family Participation in Patient Care: Yes (Comment) Care giver support system in place?: Yes (comment) Current home services:  ([facility] in not currently active with patient for home health services.) Criminal Activity/Legal Involvement Pertinent to Current Situation/Hospitalization: No - Comment as needed  Activities of Daily Living Home Assistive Devices/Equipment:  (has family at home that cares for 24/7) ADL Screening (condition at time of admission) Patient's cognitive ability adequate to safely complete daily activities?: No Is the patient deaf or have difficulty hearing?: No Does the patient have difficulty  seeing, even when wearing glasses/contacts?: No Does the patient have difficulty concentrating, remembering, or making decisions?: Yes Patient able to express need for assistance with ADLs?: No Does the patient have difficulty dressing or bathing?: Yes Independently performs ADLs?: No Communication: Independent Dressing (OT): Dependent Is this a change  from baseline?: Pre-admission baseline Grooming: Dependent Is this a change from baseline?: Pre-admission baseline Feeding: Needs assistance Is this a change from baseline?: Pre-admission baseline Bathing: Dependent Is this a change from baseline?: Pre-admission baseline Toileting: Dependent Is this a change from baseline?: Pre-admission baseline In/Out Bed: Dependent Is this a change from baseline?: Pre-admission baseline Walks in Home: Needs assistance Is this a change from baseline?: Pre-admission baseline Does the patient have difficulty walking or climbing stairs?: Yes Weakness of Legs: Both Weakness of Arms/Hands: Both  Permission Sought/Granted Permission sought to share information with : Case Manager,Family Supports Permission granted to share information with : Yes, Verbal Permission Granted     Permission granted to share info w AGENCY: Home Health agency for disicharge services  Permission granted to share info w Relationship: Leonette Most, son - [phone]     Emotional Assessment       Orientation: : Oriented to Self Alcohol / Substance Use: Not Applicable Psych Involvement: No (comment)  Admission diagnosis:  Syncope [R55] AKI (acute kidney injury) (HCC) [N17.9] Syncope, unspecified syncope type [R55] Non-intractable vomiting without nausea, unspecified vomiting type [R11.11] Patient Active Problem List   Diagnosis Date Noted  . Syncope 10/02/2020  . Syncope, vasovagal 07/12/2020  . Chronic systolic CHF (congestive heart failure) (HCC) 07/12/2020  . Elevated troponin level not due myocardial infarction 07/12/2020  . Acute bilateral low back pain with sciatica 06/05/2020  . Nonischemic cardiomyopathy (HCC) 06/02/2019  . Stage 3b chronic kidney disease (HCC) 05/14/2019  . Elevated TSH 04/13/2019  . Late effects of CVA (cerebrovascular accident) 04/06/2018  . Mixed hyperlipidemia 06/19/2016  . Vascular dementia without behavioral disturbance (HCC) 06/08/2014   . Varicose veins of lower extremities with other complications 02/02/2013  . Essential hypertension   . History of stroke   . Aortic insufficiency   . COPD (chronic obstructive pulmonary disease) (HCC)   . Chronic atrial fibrillation (HCC)   . Carotid bruit    PCP:  [name], FNP Pharmacy:   [facility] - [place], [place] - [address] [place] [address] Phone: [phone] Fax: [phone]     Social Determinants of Health (SDOH) Interventions    Readmission Risk Interventions Readmission Risk Prevention Plan 10/05/2020  Transportation Screening Complete  PCP or Specialist Appt within 5-7 Days Complete  Home Care Screening Complete  Medication Review (RN CM) Complete  Some recent data might be hidden

## 2020-10-05 NOTE — Progress Notes (Signed)
  Palliative consult received.  Chart reviewed.  Patient examined.  Attempted to reach Wanda Mckenzie. With several phone calls but to no avail.  86 y.o. female  with past medical history of vascular dementia, CVAs, COPD, CHF, aortic insufficiency, CKD 3, hypothyroidism, who was admitted on 10/02/2020 with syncope.  She had had a similar admission in January 2022.  In the hospital Cardiology was consulted.  They felt she was too fragile for invasive therapies including a pacemaker.  She demonstrated episodes of both tachycardia and bradycardia on telemetry.  She was found to have orthostatic hypotension with supine hypertension.   PT/OT evaluation indicated she is a total assist for all ADLs.  PMT was requested for GOC and a MOST form.  Assessment:  Unfortunately Wanda Mckenzie does not yet qualify for hospice based on a dementia diagnosis.  Given her autonomic dysregulation she may be eligible for Hospice services in the home particularly if they aligned with her goals of care.  A conversation with her family is needed and a MOST form would be beneficial.  Recommend:  Palliative to follow outpatient at home as she is due to discharge on 4/16  Norvel Richards, PA-C Palliative Medicine Office:  [phone]

## 2020-10-05 NOTE — Plan of Care (Signed)
Patient resting in bed. No c/o overnight. VSS. Call bell within reach. Bed alarm on.   Problem: Education: Goal: Knowledge of General Education information will improve Description: Including pain rating scale, medication(s)/side effects and non-pharmacologic comfort measures Outcome: Progressing   Problem: Health Behavior/Discharge Planning: Goal: Ability to manage health-related needs will improve Outcome: Progressing   Problem: Clinical Measurements: Goal: Ability to maintain clinical measurements within normal limits will improve Outcome: Progressing Goal: Will remain free from infection Outcome: Progressing Goal: Diagnostic test results will improve Outcome: Progressing Goal: Respiratory complications will improve Outcome: Progressing Goal: Cardiovascular complication will be avoided Outcome: Progressing   Problem: Activity: Goal: Risk for activity intolerance will decrease Outcome: Progressing   Problem: Nutrition: Goal: Adequate nutrition will be maintained Outcome: Progressing   Problem: Coping: Goal: Level of anxiety will decrease Outcome: Progressing   Problem: Elimination: Goal: Will not experience complications related to bowel motility Outcome: Progressing Goal: Will not experience complications related to urinary retention Outcome: Progressing   Problem: Safety: Goal: Ability to remain free from injury will improve Outcome: Progressing   Problem: Skin Integrity: Goal: Risk for impaired skin integrity will decrease Outcome: Progressing

## 2020-10-06 DIAGNOSIS — R55 Syncope and collapse: Secondary | ICD-10-CM | POA: Diagnosis not present

## 2020-10-06 LAB — GLUCOSE, CAPILLARY: Glucose-Capillary: 88 mg/dL (ref 70–99)

## 2020-10-06 MED ORDER — PYRIDOSTIGMINE BROMIDE 60 MG PO TABS: 60.0000 mg | ORAL_TABLET | Freq: Two times a day (BID) | ORAL | 0 refills | Status: DC

## 2020-10-06 MED ORDER — LISINOPRIL 5 MG PO TABS: 5.0000 mg | ORAL_TABLET | Freq: Every day | ORAL | 0 refills | Status: DC

## 2020-10-06 NOTE — Care Management (Signed)
1415 10-06-20 Case Manager was able to speak with the patient's niece Boneta Lucks and we discussed Palliative services. Family is agreeable to Palliative services via [facility]. Referral was made to [facility] intake RN with [facility]. Hospice. The office will call the son and the niece with visit times. Niece to provide transportation home via private vehicle. No further needs from Case Manager. Gala Lewandowsky, RN,BSN Case Manager

## 2020-10-06 NOTE — Progress Notes (Signed)
Patient discharged home.  Discharge instructions were called in to son Leonette Most) and niece Boneta Lucks), both verbalize understanding and denied any questions.  Niece Boneta Lucks) picked up patient and discharge instructions were given to [facility].

## 2020-10-06 NOTE — Discharge Instructions (Signed)
Follow with Primary MD [name], FNP in 7 days   Get CBC, CMP, 2 view Chest X ray -  checked next visit within 1 week by Primary MD    Activity: As tolerated with Full fall precautions use walker/cane & assistance as needed  Disposition Home    Diet: Soft with feeding assistance and aspiration precautions.    Special Instructions: If you have smoked or chewed Tobacco  in the last 2 yrs please stop smoking, stop any regular Alcohol  and or any Recreational drug use.  On your next visit with your primary care physician please Get Medicines reviewed and adjusted.  Please request your Prim.MD to go over all Hospital Tests and Procedure/Radiological results at the follow up, please get all Hospital records sent to your Prim MD by signing hospital release before you go home.  If you experience worsening of your admission symptoms, develop shortness of breath, life threatening emergency, suicidal or homicidal thoughts you must seek medical attention immediately by calling 911 or calling your MD immediately  if symptoms less severe.  You Must read complete instructions/literature along with all the possible adverse reactions/side effects for all the Medicines you take and that have been prescribed to you. Take any new Medicines after you have completely understood and accpet all the possible adverse reactions/side effects.

## 2020-10-06 NOTE — Plan of Care (Signed)
Patient is currently resting in bed. No complaints overnight. Aox1. VSS. HR in the 50's this AM. Voiding. Q2 turns. Pt becomes very upset when staff move her or have to clean her up. Plan for d/c home with family.   Problem: Education: Goal: Knowledge of General Education information will improve Description: Including pain rating scale, medication(s)/side effects and non-pharmacologic comfort measures Outcome: Progressing   Problem: Clinical Measurements: Goal: Ability to maintain clinical measurements within normal limits will improve Outcome: Progressing Goal: Will remain free from infection Outcome: Progressing Goal: Diagnostic test results will improve Outcome: Progressing Goal: Respiratory complications will improve Outcome: Progressing Goal: Cardiovascular complication will be avoided Outcome: Progressing   Problem: Activity: Goal: Risk for activity intolerance will decrease Outcome: Progressing   Problem: Nutrition: Goal: Adequate nutrition will be maintained Outcome: Progressing   Problem: Coping: Goal: Level of anxiety will decrease Outcome: Progressing   Problem: Elimination: Goal: Will not experience complications related to bowel motility Outcome: Progressing Goal: Will not experience complications related to urinary retention Outcome: Progressing   Problem: Pain Managment: Goal: General experience of comfort will improve Outcome: Progressing   Problem: Safety: Goal: Ability to remain free from injury will improve Outcome: Progressing   Problem: Skin Integrity: Goal: Risk for impaired skin integrity will decrease Outcome: Progressing

## 2020-10-06 NOTE — Discharge Summary (Signed)
Wanda Mckenzie OJJ:[id number] DOB: 1936-01-09 DOA: 10/02/2020  PCP: [name], FNP  Admit date: 10/02/2020  Discharge date: 10/06/2020  Admitted From: Home   Disposition:  Home ( refused SNF)   Recommendations for Outpatient Follow-up:   Follow up with PCP in 1-2 weeks  PCP Please obtain BMP/CBC, 2 view CXR in 1week,  (see Discharge instructions)   PCP Please follow up on the following pending results: Monitor supine blood pressure, orthostatic blood pressure, CBC and BMP closely.   Home Health: PT, RN, SW   Equipment/Devices: None  Consultations: Cards Discharge Condition: Fair   CODE STATUS: DNR    Diet Recommendation: Soft  Diet Order            Diet Heart Room service appropriate? No; Fluid consistency: Thin  Diet effective now                  Chief Complaint  Patient presents with  . Loss of Consciousness     Brief history of present illness from the day of admission and additional interim summary    Wanda Hufstedler Nelsonis a 85 y.o.femalewith medical history significant ofchronic systolic CHF(LVEF 40 to 45%),HLD, CKD stage III, vascular dementia, chronic A. fib on Eliquis, frequent PVCs on amiodarone, presented with syncope while sitting on the chair, was noted to be hypotensive at that time as well, recently on a previous admission she had a similar issue and was found to be orthostatic.                                                                 Hospital Course     Recurrent syncope  - while having a BM, likely vasovagal/orthostatic etiology, BP was low at that time, seen by Cards not a candidate for AICD/PPM, hydrated, TEDs, PT, she has remained stable at about no further passing out spells or orthostatic hypotension, she has been placed on Mestinon in case she has transient  orthostatic hypotension with good supine blood pressures, she was stable on telemetry as well.  No further episodes of syncopal events since she has been in the hospital.  She did qualify for SNF however her niece who is her primary caretaker prefers taking patient home with her.  Borderline bradycardia - As above, avoid nodal blocking agents.  Was seen by cardiology.  QTC prolongation - resolved on repeat EKG .  AKI -  Resolved after, holding Lasix  & gentle IVF.  Reintroduce low-dose lisinopril and monitor.  Monitor fluid status and BMP closely in the outpatient setting by PCP.  Chronic A. Fib - CHA2DS2-VASc 2 score of greater than 3.  Continue on home dose Eliquis, stable H&H.  Avoiding nodal blocking agents due to underlying bradycardia.   Supine hypertension, currently on combination of hydralazine, low-dose ACE  inhibitor and Norvasc, monitor.   Chronic combined systolic and diastolic heart failure EF 40% with global hypokinesis.  Medical management only.  Hypothyroidism - Continue levothyroxine. Recent TSH noted to be 4.7. Recommend rechecking in a few weeks.  COPD - Stable. No evidence of exacerbation.  Vascular dementia - Longstanding history of advanced vascular dementia.On Namenda.  Hyperlipidemia - Continue statin.   Discharge diagnosis     Active Problems:   Syncope, vasovagal   Syncope    Discharge instructions    Discharge Instructions    Discharge instructions   Complete by: As directed    Follow with Primary MD [name], FNP in 7 days   Get CBC, CMP, 2 view Chest X ray -  checked next visit within 1 week by Primary MD    Activity: As tolerated with Full fall precautions use walker/cane & assistance as needed  Disposition Home    Diet: Soft with feeding assistance and aspiration precautions.    Special Instructions: If you have smoked or chewed Tobacco  in the last 2 yrs please stop smoking, stop any regular Alcohol  and or any  Recreational drug use.  On your next visit with your primary care physician please Get Medicines reviewed and adjusted.  Please request your Prim.MD to go over all Hospital Tests and Procedure/Radiological results at the follow up, please get all Hospital records sent to your Prim MD by signing hospital release before you go home.  If you experience worsening of your admission symptoms, develop shortness of breath, life threatening emergency, suicidal or homicidal thoughts you must seek medical attention immediately by calling 911 or calling your MD immediately  if symptoms less severe.  You Must read complete instructions/literature along with all the possible adverse reactions/side effects for all the Medicines you take and that have been prescribed to you. Take any new Medicines after you have completely understood and accpet all the possible adverse reactions/side effects.   Increase activity slowly   Complete by: As directed       Discharge Medications   Allergies as of 10/06/2020      Reactions   Codeine Nausea And Vomiting      Medication List    STOP taking these medications   losartan 100 MG tablet Commonly known as: COZAAR   methocarbamol 500 MG tablet Commonly known as: ROBAXIN     TAKE these medications   acetaminophen 500 MG tablet Commonly known as: TYLENOL Take 1,000 mg by mouth every 6 (six) hours as needed for headache (pain).   AeroChamber Plus inhaler Use as instructed   albuterol 108 (90 Base) MCG/ACT inhaler Commonly known as: VENTOLIN HFA Inhale 2 puffs into the lungs every 6 (six) hours as needed for wheezing or shortness of breath.   amiodarone 200 MG tablet Commonly known as: PACERONE TAKE 1 TABLET ONCE DAILY.   amLODipine 5 MG tablet Commonly known as: NORVASC Take 1 tablet (5 mg total) by mouth daily.   atorvastatin 20 MG tablet Commonly known as: LIPITOR TAKE (1) TABLET DAILY AT BEDTIME. What changed: See the new instructions.    diclofenac Sodium 1 % Gel Commonly known as: VOLTAREN Apply 2 g topically 4 (four) times daily.   Eliquis 5 MG Tabs tablet Generic drug: apixaban TAKE ONE TABLET TWICE A DAY. What changed: how much to take   furosemide 20 MG tablet Commonly known as: LASIX TAKE 1 TAB. 3 TIMES A WEEK; MONDAY, WEDNESDAY & FRIDAY. What changed:   how much  to take  how to take this  when to take this   hydrALAZINE 100 MG tablet Commonly known as: APRESOLINE TAKE (1) TABLET EVERY EIGHT HOURS. What changed: See the new instructions.   levothyroxine 25 MCG tablet Commonly known as: SYNTHROID TAKE (1/2) TABLET DAILY. (NEEDS LABWORK) What changed:   how much to take  how to take this  when to take this   lisinopril 5 MG tablet Commonly known as: ZESTRIL Take 1 tablet (5 mg total) by mouth daily. Start taking on: October 07, 2020   memantine 28 MG Cp24 24 hr capsule Commonly known as: NAMENDA XR TAKE (1) CAPSULE DAILY. What changed: See the new instructions.   potassium chloride SA 20 MEQ tablet Commonly known as: KLOR-CON Take 1 tablet (20 mEq total) by mouth daily.   pyridostigmine 60 MG tablet Commonly known as: MESTINON Take 1 tablet (60 mg total) by mouth 2 (two) times daily.        Follow-up Information    [facility] Follow up.   Specialty: Home Health Services Why: Frances Furbish will be providing home health services to include nursing, physical therapy and medical social work upon discharge from the hospital starting 10/08/2020.  Frances Furbish will call you in the next 24-48 hours to set up initial assessment time and therapy. Contact information: [address] [place] [address] [phone]        [name], FNP Follow up.   Specialty: Family Medicine Why: Please call the office and set up a hospital follow up with the primary care physician / nurse practitioner in the next 7-10 days. Contact information: [address] [place]  [address] [phone]               Major procedures and Radiology Reports - PLEASE review detailed and final reports thoroughly  -       CT HEAD WO CONTRAST  Result Date: 10/02/2020 CLINICAL DATA:  Altered mental status. EXAM: CT HEAD WITHOUT CONTRAST TECHNIQUE: Contiguous axial images were obtained from the base of the skull through the vertex without intravenous contrast. COMPARISON:  July 12, 2020. FINDINGS: Brain: Mild chronic ischemic white matter disease is noted. Minimal diffuse cortical atrophy is noted. Old right frontal lobe and occipital lobe infarctions are noted. No mass effect or midline shift is noted. Ventricular size is within normal limits. There is no evidence of mass lesion, hemorrhage or acute infarction. Vascular: No hyperdense vessel or unexpected calcification. Skull: Normal. Negative for fracture or focal lesion. Sinuses/Orbits: No acute finding. Other: None. IMPRESSION: Old right frontal and occipital infarctions. No acute intracranial abnormality seen. Electronically Signed   By: Lupita Raider M.D.   On: 10/02/2020 13:40   DG Chest Port 1 View  Result Date: 10/02/2020 CLINICAL DATA:  Syncopal episode. History of CHF and COPD. EXAM: PORTABLE CHEST 1 VIEW COMPARISON:  07/12/2020 FINDINGS: Stable enlarged cardiac silhouette and tortuous and calcified thoracic aorta. The lungs remain clear with normal vascularity. Thoracic spine degenerative changes. IMPRESSION: Stable cardiomegaly. No acute abnormality. Electronically Signed   By: Beckie Salts M.D.   On: 10/02/2020 12:04   DG Abd 2 Views  Result Date: 10/02/2020 CLINICAL DATA:  Vomiting today. Recent fecal impaction. EXAM: ABDOMEN - 2 VIEW COMPARISON:  07/12/2020 FINDINGS: Normal bowel gas pattern without free peritoneal air. Normal amount of stool. Cholecystectomy clips. Left hip prosthesis. Lower thoracic spine degenerative changes and mild lumbar spine degenerative changes. IMPRESSION: No acute abnormality.   Normal  amount of stool. Electronically Signed   By: Beckie Salts M.D.   On: 10/02/2020 12:48    Micro Results     No results found for this or any previous visit (from the past 240 hour(s)).  Today   Subjective    Wanda Mckenzie today has no headache,no chest abdominal pain,no new weakness tingling or numbness, feels much better wants to go home today    Objective   Blood pressure (!) 130/47, pulse (!) 51, temperature 97.9 F (36.6 C), temperature source Axillary, resp. rate 14, height 6\' 2"  (1.88 m), weight 71.7 kg, SpO2 96 %.   Intake/Output Summary (Last 24 hours) at 10/06/2020 1103 Last data filed at 10/06/2020 10/08/2020 Gross per 24 hour  Intake 550 ml  Output 300 ml  Net 250 ml    Exam  Awake but confused, No new F.N deficits,  [place].AT,PERRAL Supple Neck,No JVD, No cervical lymphadenopathy appriciated.  Symmetrical Chest wall movement, Good air movement bilaterally, CTAB RRR,No Gallops,Rubs or new Murmurs, No Parasternal Heave +ve B.Sounds, Abd Soft, Non tender, No organomegaly appriciated, No rebound -guarding or rigidity. No Cyanosis, Clubbing or edema, No new Rash or bruise   Data Review   CBC w Diff:  Lab Results  Component Value Date   WBC 8.6 10/03/2020   HGB 13.2 10/03/2020   HGB 14.1 06/05/2020   HCT 39.4 10/03/2020   HCT 42.1 06/05/2020   PLT 257 10/03/2020   PLT 261 06/05/2020   LYMPHOPCT 22 10/02/2020   BANDSPCT PENDING 05/19/2019   MONOPCT 7 10/02/2020   EOSPCT 2 10/02/2020   BASOPCT 1 10/02/2020    CMP:  Lab Results  Component Value Date   NA 139 10/03/2020   NA 144 06/05/2020   K 3.5 10/03/2020   CL 106 10/03/2020   CO2 26 10/03/2020   BUN 14 10/03/2020   BUN 15 06/05/2020   CREATININE 0.99 10/03/2020   PROT 5.9 (L) 10/02/2020   PROT 6.5 06/05/2020   ALBUMIN 3.5 10/02/2020   ALBUMIN 4.0 06/05/2020   BILITOT 0.9 10/02/2020   BILITOT 0.6 06/05/2020   ALKPHOS 58 10/02/2020   AST 28 10/02/2020   ALT 14 10/02/2020  .   Total Time  in preparing paper work, data evaluation and todays exam - 35 minutes  12/02/2020 M.D on 10/06/2020 at 11:03 AM  [facility]

## 2020-10-08 ENCOUNTER — Telehealth: Payer: Self-pay

## 2020-10-08 NOTE — Telephone Encounter (Signed)
Transition Care Management Follow-up Telephone Call  Date of discharge and from where: 10/06/20 from [facility]  How have you been since you were released from the hospital? She is doing better, slowly getting stronger  Any questions or concerns? No  Items Reviewed:  Did the pt receive and understand the discharge instructions provided? Yes   Medications obtained and verified? Yes   Other? No   Any new allergies since your discharge? No   Dietary orders reviewed? Yes  Do you have support at home? Yes   Home Care and Equipment/Supplies: Were home health services ordered? yes If so, what is the name of the agency? [facility]  Has the agency set up a time to come to the patient's home? No - they haven't heard from them so far  Functional Questionnaire: (I = Independent and D = Dependent) ADLs: D  Bathing/Dressing- D  Meal Prep- D  Eating- I  Maintaining continence- D  Transferring/Ambulation- D  Managing Meds- D  Follow up appointments reviewed:   PCP Hospital f/u appt confirmed? Yes  Scheduled to see Neal Dy on 4/26 @ 11.  Specialist Hospital f/u appt confirmed? No  Are transportation arrangements needed? No   If their condition worsens, is the pt aware to call PCP or go to the Emergency Dept.? Yes  Was the patient provided with contact information for the PCP's office or ED? Yes  Was to pt encouraged to call back with questions or concerns? Yes

## 2020-10-09 ENCOUNTER — Telehealth: Payer: Self-pay

## 2020-10-09 NOTE — Telephone Encounter (Signed)
Wanda Mckenzie called from [facility] stating that they need Wanda Mckenzie to send them an order for patient to get serious illness services [facility]).  Says they are trying to stay away from the word "Pallative" because people get it mixed up with Hospice all of the time and wanted me to relay that message.  Also wanted me to let office know that they have a clinic now with new phone and fax #'s.  Phone # is [phone] Fax# is [phone]

## 2020-10-09 NOTE — Telephone Encounter (Signed)
Patient needs a hospital follow-up visit and this can be ordered then. In the [facility] note she was scheduled with Je on 10/16/20 but I do not see this in the appointments. It looks like she isn't scheduled to see me until next month. She needs a hospital follow-up sooner than that.

## 2020-10-10 NOTE — Telephone Encounter (Signed)
Rhonda from palliative care is calling to check with Wanda Mckenzie about going to pt's home before her hospital follow up appt (4/28) and would like to speak to Wanda Mckenzie directly to discuss

## 2020-10-10 NOTE — Telephone Encounter (Signed)
Appointment scheduled.

## 2020-10-11 NOTE — Telephone Encounter (Signed)
I cannot give these orders until she has a hospital follow-up. If she needs to be seen before her appointment with me on 10/18/20, please schedule with one of the other providers in the office.

## 2020-10-11 NOTE — Telephone Encounter (Signed)
LMOVM that Britney would like to hold off on order until patient is seen, appt is for 10/18/20. She may call and make an earlier appt with Erie Noe or Elmarie Shiley

## 2020-10-15 ENCOUNTER — Other Ambulatory Visit: Payer: Self-pay | Admitting: Family Medicine

## 2020-10-15 DIAGNOSIS — I10 Essential (primary) hypertension: Secondary | ICD-10-CM

## 2020-10-15 DIAGNOSIS — I509 Heart failure, unspecified: Secondary | ICD-10-CM

## 2020-10-18 ENCOUNTER — Other Ambulatory Visit: Payer: Self-pay

## 2020-10-18 ENCOUNTER — Encounter: Payer: Self-pay | Admitting: Family Medicine

## 2020-10-18 ENCOUNTER — Other Ambulatory Visit: Payer: Self-pay | Admitting: Family Medicine

## 2020-10-18 ENCOUNTER — Ambulatory Visit (INDEPENDENT_AMBULATORY_CARE_PROVIDER_SITE_OTHER): Payer: Medicare Other | Admitting: Family Medicine

## 2020-10-18 VITALS — BP 104/63 | HR 61 | Temp 97.0°F

## 2020-10-18 DIAGNOSIS — E782 Mixed hyperlipidemia: Secondary | ICD-10-CM

## 2020-10-18 DIAGNOSIS — R55 Syncope and collapse: Secondary | ICD-10-CM | POA: Diagnosis not present

## 2020-10-18 DIAGNOSIS — J449 Chronic obstructive pulmonary disease, unspecified: Secondary | ICD-10-CM

## 2020-10-18 DIAGNOSIS — I482 Chronic atrial fibrillation, unspecified: Secondary | ICD-10-CM

## 2020-10-18 DIAGNOSIS — I5022 Chronic systolic (congestive) heart failure: Secondary | ICD-10-CM

## 2020-10-18 DIAGNOSIS — I10 Essential (primary) hypertension: Secondary | ICD-10-CM

## 2020-10-18 DIAGNOSIS — Z09 Encounter for follow-up examination after completed treatment for conditions other than malignant neoplasm: Secondary | ICD-10-CM

## 2020-10-18 DIAGNOSIS — Z8673 Personal history of transient ischemic attack (TIA), and cerebral infarction without residual deficits: Secondary | ICD-10-CM

## 2020-10-18 DIAGNOSIS — R7989 Other specified abnormal findings of blood chemistry: Secondary | ICD-10-CM

## 2020-10-18 DIAGNOSIS — F0150 Vascular dementia without behavioral disturbance: Secondary | ICD-10-CM

## 2020-10-18 DIAGNOSIS — I509 Heart failure, unspecified: Secondary | ICD-10-CM

## 2020-10-18 DIAGNOSIS — N1832 Chronic kidney disease, stage 3b: Secondary | ICD-10-CM

## 2020-10-18 NOTE — Progress Notes (Signed)
Assessment & Plan:  1. Syncope, unspecified syncope type No further episodes.  Felt to be vasovagal/orthostatic in etiology.  Patient is taking Mestinon that was prescribed in the hospital.  [facility] discharge follow-up  3. Essential hypertension Well controlled on current regimen.  - CMP14+EGFR - CBC with Differential/Platelet  4. Chronic atrial fibrillation (HCC) Well controlled on current regimen.  - CMP14+EGFR - CBC with Differential/Platelet  5. History of stroke Patient is on Eliquis and a statin.  6. Chronic systolic CHF (congestive heart failure) ([place]) Well controlled on current regimen.  Referral placed to serious illness care. - CMP14+EGFR  7. Mixed hyperlipidemia Well controlled on current regimen.  - CMP14+EGFR  8. Elevated TSH Labs to assess. - T4, free - TSH  9. Chronic obstructive pulmonary disease, unspecified COPD type ([place]) Well controlled on current regimen.   10. Vascular dementia without behavioral disturbance ([place]) Well controlled on current regimen.  Referral placed to serious illness care.  11. Stage 3b chronic kidney disease ([place]) Managed by Dr. Theador Mckenzie. - CMP14+EGFR  Permission for referral to serious illness care was obtained by patient POA, her son Wanda Mckenzie.   Follow up plan: Return in about 4 months (around 02/17/2021) for follow-up of chronic medication conditions.  Wanda Limes, MSN, APRN, FNP-C [facility]  Subjective:   Patient ID: Wanda Mckenzie, female    DOB: December 15, 1935, 85 y.o.   MRN: [id number]  HPI: Wanda Mckenzie is a 85 y.o. female presenting on 10/18/2020 for Hospitalization Follow-up (4/12 [facility]- Syncope)  Patient is accompanied by her niece and great niece.  Patient is here for a hospital follow-up.  She was admitted to [facility] 10/02/2020-10/06/2020 after an episode of syncope.  Felt to be vasovagal/orthostatic in nature.  Patient was seen by cardiology and is not a candidate for  AICD/PPM.  Patient was encouraged to stay well-hydrated, wear compression hose, and participate in physical therapy.  She was started on Mestinon.  It was recommended she discharge to SNF however her caregiver preferred to take her home.  Since her discharge from the hospital there has been a request for a referral to serious illness care due to her dementia, CHF, and recurrent syncopal episodes.  Patient's niece is her primary caregiver at home.  She reports the day that she went to the hospital [name] was sitting at the kitchen table and quit responding to her.  When she went over to look at her she had her head drooped over to the side, was drooling and slurring her words, but was making eye contact with the niece.  She felt like she was having a stroke.  She does take Eliquis 5 mg twice daily due to history of atrial fibrillation.  No further episodes since she has been home from the hospital.  She is taking all medications as prescribed.   ROS: Negative unless specifically indicated above in HPI.   Relevant past medical history reviewed and updated as indicated.   Allergies and medications reviewed and updated.   Current Outpatient Medications:  .  acetaminophen (TYLENOL) 500 MG tablet, Take 1,000 mg by mouth every 6 (six) hours as needed for headache (pain). (Patient not taking: Reported on 10/08/2020), Disp: , Rfl:  .  albuterol (VENTOLIN HFA) 108 (90 Base) MCG/ACT inhaler, Inhale 2 puffs into the lungs every 6 (six) hours as needed for wheezing or shortness of breath. (Patient not taking: Reported on 10/08/2020), Disp: 18 g, Rfl: 2 .  amiodarone (PACERONE) 200 MG tablet,  TAKE 1 TABLET ONCE DAILY. (Patient taking differently: Take 200 mg by mouth daily.), Disp: 90 tablet, Rfl: 0 .  amLODipine (NORVASC) 5 MG tablet, Take 1 tablet (5 mg total) by mouth daily., Disp: 90 tablet, Rfl: 0 .  atorvastatin (LIPITOR) 20 MG tablet, TAKE (1) TABLET DAILY AT BEDTIME. (Patient taking differently: Take 20  mg by mouth at bedtime.), Disp: 30 tablet, Rfl: 5 .  diclofenac Sodium (VOLTAREN) 1 % GEL, Apply 2 g topically 4 (four) times daily., Disp: 50 g, Rfl: 2 .  ELIQUIS 5 MG TABS tablet, TAKE ONE TABLET TWICE A DAY. (Patient taking differently: Take 5 mg by mouth 2 (two) times daily.), Disp: 60 tablet, Rfl: 0 .  furosemide (LASIX) 20 MG tablet, TAKE 1 TAB. 3 TIMES A WEEK; MONDAY, WEDNESDAY & FRIDAY. (Patient taking differently: Take 20 mg by mouth See admin instructions. TAKE 1 TAB. 3 TIMES A WEEK; MONDAY, WEDNESDAY & FRIDAY.), Disp: 30 tablet, Rfl: 0 .  hydrALAZINE (APRESOLINE) 100 MG tablet, TAKE (1) TABLET EVERY EIGHT HOURS., Disp: 90 tablet, Rfl: 0 .  levothyroxine (SYNTHROID) 25 MCG tablet, TAKE (1/2) TABLET DAILY. (NEEDS LABWORK) (Patient taking differently: Take 25 mcg by mouth daily before breakfast. TAKE (1/2) TABLET DAILY. (NEEDS LABWORK)), Disp: 15 tablet, Rfl: 0 .  lisinopril (ZESTRIL) 5 MG tablet, Take 1 tablet (5 mg total) by mouth daily., Disp: 30 tablet, Rfl: 0 .  memantine (NAMENDA XR) 28 MG CP24 24 hr capsule, TAKE (1) CAPSULE DAILY. (Patient taking differently: Take 28 mg by mouth daily.), Disp: 30 capsule, Rfl: 0 .  potassium chloride SA (KLOR-CON) 20 MEQ tablet, TAKE 1 TABLET ONCE DAILY., Disp: 30 tablet, Rfl: 3 .  pyridostigmine (MESTINON) 60 MG tablet, Take 1 tablet (60 mg total) by mouth 2 (two) times daily., Disp: 60 tablet, Rfl: 0 .  Spacer/Aero-Holding Chambers (AEROCHAMBER PLUS) inhaler, Use as instructed (Patient not taking: Reported on 10/08/2020), Disp: 1 each, Rfl: 2  Allergies  Allergen Reactions  . Codeine Nausea And Vomiting    Objective:   BP 104/63   Pulse 61   Temp (!) 97 F (36.1 C) (Temporal)   SpO2 97%    Physical Exam Vitals reviewed.  Constitutional:      General: She is not in acute distress.    Appearance: Normal appearance. She is not ill-appearing, toxic-appearing or diaphoretic.  HENT:     Head: Normocephalic and atraumatic.  Eyes:      General: No scleral icterus.       Right eye: No discharge.        Left eye: No discharge.     Conjunctiva/sclera: Conjunctivae normal.  Cardiovascular:     Rate and Rhythm: Normal rate and regular rhythm.     Heart sounds: Normal heart sounds. No murmur heard. No friction rub. No gallop.   Pulmonary:     Effort: Pulmonary effort is normal. No respiratory distress.     Breath sounds: Normal breath sounds. No stridor. No wheezing, rhonchi or rales.  Musculoskeletal:        General: Normal range of motion.     Cervical back: Normal range of motion.  Skin:    General: Skin is warm and dry.     Capillary Refill: Capillary refill takes less than 2 seconds.  Neurological:     General: No focal deficit present.     Mental Status: She is alert and oriented to person, place, and time. Mental status is at baseline.     Gait: Gait abnormal (  riding in [facility]).  Psychiatric:        Behavior: Behavior normal.        Thought Content: Thought content normal.        Cognition and Memory: Cognition is impaired. Memory is impaired.

## 2020-10-19 ENCOUNTER — Telehealth: Payer: Self-pay

## 2020-10-19 LAB — CBC WITH DIFFERENTIAL/PLATELET
Basophils Absolute: 0.1 10*3/uL (ref 0.0–0.2)
Basos: 1 %
EOS (ABSOLUTE): 0.2 10*3/uL (ref 0.0–0.4)
Eos: 3 %
Hematocrit: 41.1 % (ref 34.0–46.6)
Hemoglobin: 13.6 g/dL (ref 11.1–15.9)
Immature Grans (Abs): 0 10*3/uL (ref 0.0–0.1)
Immature Granulocytes: 0 %
Lymphocytes Absolute: 1.7 10*3/uL (ref 0.7–3.1)
Lymphs: 26 %
MCH: 31.9 pg (ref 26.6–33.0)
MCHC: 33.1 g/dL (ref 31.5–35.7)
MCV: 97 fL (ref 79–97)
Monocytes Absolute: 0.5 10*3/uL (ref 0.1–0.9)
Monocytes: 7 %
Neutrophils Absolute: 4 10*3/uL (ref 1.4–7.0)
Neutrophils: 63 %
Platelets: 363 10*3/uL (ref 150–450)
RBC: 4.26 x10E6/uL (ref 3.77–5.28)
RDW: 12.7 % (ref 11.7–15.4)
WBC: 6.4 10*3/uL (ref 3.4–10.8)

## 2020-10-19 LAB — CMP14+EGFR
ALT: 10 IU/L (ref 0–32)
AST: 21 IU/L (ref 0–40)
Albumin/Globulin Ratio: 1.7 (ref 1.2–2.2)
Albumin: 3.9 g/dL (ref 3.6–4.6)
Alkaline Phosphatase: 94 IU/L (ref 44–121)
BUN/Creatinine Ratio: 14 (ref 12–28)
BUN: 13 mg/dL (ref 8–27)
Bilirubin Total: 0.5 mg/dL (ref 0.0–1.2)
CO2: 22 mmol/L (ref 20–29)
Calcium: 8.6 mg/dL — ABNORMAL LOW (ref 8.7–10.3)
Chloride: 106 mmol/L (ref 96–106)
Creatinine, Ser: 0.91 mg/dL (ref 0.57–1.00)
Globulin, Total: 2.3 g/dL (ref 1.5–4.5)
Glucose: 97 mg/dL (ref 65–99)
Potassium: 4.2 mmol/L (ref 3.5–5.2)
Sodium: 144 mmol/L (ref 134–144)
Total Protein: 6.2 g/dL (ref 6.0–8.5)
eGFR: 63 mL/min/{1.73_m2} (ref >59)

## 2020-10-19 LAB — T4, FREE: Free T4: 2.1 ng/dL — ABNORMAL HIGH (ref 0.82–1.77)

## 2020-10-19 LAB — TSH: TSH: 9.46 u[IU]/mL — ABNORMAL HIGH (ref 0.450–4.500)

## 2020-10-19 NOTE — Telephone Encounter (Signed)
This will be completed and faxed. It is in my in-basket.

## 2020-10-19 NOTE — Telephone Encounter (Signed)
Judeth Cornfield called from [facility] stating that she received pts notes from visit she had yesterday and saw that per Britneys notes, referral was placed to see serious illness clinic, but says she has not received the order for it.   Can send order to fax# [phone]  Can call Judeth Cornfield with any questions at [phone]

## 2020-10-20 ENCOUNTER — Other Ambulatory Visit: Payer: Self-pay | Admitting: Family Medicine

## 2020-10-20 DIAGNOSIS — R7989 Other specified abnormal findings of blood chemistry: Secondary | ICD-10-CM

## 2020-10-20 MED ORDER — LEVOTHYROXINE SODIUM 25 MCG PO TABS: 25.0000 ug | ORAL_TABLET | Freq: Every day | ORAL | 2 refills | Status: DC

## 2020-10-24 ENCOUNTER — Ambulatory Visit: Payer: Medicare Other | Admitting: Family Medicine

## 2020-10-30 ENCOUNTER — Other Ambulatory Visit: Payer: Self-pay

## 2020-10-30 ENCOUNTER — Ambulatory Visit (INDEPENDENT_AMBULATORY_CARE_PROVIDER_SITE_OTHER): Payer: Medicare Other

## 2020-10-30 DIAGNOSIS — I130 Hypertensive heart and chronic kidney disease with heart failure and stage 1 through stage 4 chronic kidney disease, or unspecified chronic kidney disease: Secondary | ICD-10-CM | POA: Diagnosis not present

## 2020-10-30 DIAGNOSIS — Z96642 Presence of left artificial hip joint: Secondary | ICD-10-CM

## 2020-10-30 DIAGNOSIS — J449 Chronic obstructive pulmonary disease, unspecified: Secondary | ICD-10-CM

## 2020-10-30 DIAGNOSIS — Z7901 Long term (current) use of anticoagulants: Secondary | ICD-10-CM

## 2020-10-30 DIAGNOSIS — I5042 Chronic combined systolic (congestive) and diastolic (congestive) heart failure: Secondary | ICD-10-CM

## 2020-10-30 DIAGNOSIS — M47816 Spondylosis without myelopathy or radiculopathy, lumbar region: Secondary | ICD-10-CM

## 2020-10-30 DIAGNOSIS — I700 Atherosclerosis of aorta: Secondary | ICD-10-CM

## 2020-10-30 DIAGNOSIS — I951 Orthostatic hypotension: Secondary | ICD-10-CM | POA: Diagnosis not present

## 2020-10-30 DIAGNOSIS — F0150 Vascular dementia without behavioral disturbance: Secondary | ICD-10-CM

## 2020-10-30 DIAGNOSIS — I482 Chronic atrial fibrillation, unspecified: Secondary | ICD-10-CM

## 2020-10-30 DIAGNOSIS — Z9181 History of falling: Secondary | ICD-10-CM

## 2020-10-30 DIAGNOSIS — Z8673 Personal history of transient ischemic attack (TIA), and cerebral infarction without residual deficits: Secondary | ICD-10-CM

## 2020-10-30 DIAGNOSIS — N183 Chronic kidney disease, stage 3 unspecified: Secondary | ICD-10-CM

## 2020-10-30 DIAGNOSIS — M47814 Spondylosis without myelopathy or radiculopathy, thoracic region: Secondary | ICD-10-CM

## 2020-10-30 DIAGNOSIS — R001 Bradycardia, unspecified: Secondary | ICD-10-CM

## 2020-10-30 DIAGNOSIS — E039 Hypothyroidism, unspecified: Secondary | ICD-10-CM

## 2020-10-30 DIAGNOSIS — E785 Hyperlipidemia, unspecified: Secondary | ICD-10-CM

## 2020-11-07 ENCOUNTER — Other Ambulatory Visit: Payer: Self-pay | Admitting: Family Medicine

## 2020-11-08 ENCOUNTER — Other Ambulatory Visit: Payer: Self-pay | Admitting: Family Medicine

## 2020-11-13 ENCOUNTER — Ambulatory Visit (INDEPENDENT_AMBULATORY_CARE_PROVIDER_SITE_OTHER): Payer: Medicare Other | Admitting: Family Medicine

## 2020-11-13 ENCOUNTER — Other Ambulatory Visit: Payer: Medicare Other

## 2020-11-13 DIAGNOSIS — R300 Dysuria: Secondary | ICD-10-CM

## 2020-11-13 DIAGNOSIS — R7989 Other specified abnormal findings of blood chemistry: Secondary | ICD-10-CM

## 2020-11-13 DIAGNOSIS — R410 Disorientation, unspecified: Secondary | ICD-10-CM | POA: Diagnosis not present

## 2020-11-13 DIAGNOSIS — F0391 Unspecified dementia with behavioral disturbance: Secondary | ICD-10-CM

## 2020-11-13 LAB — URINALYSIS
Bilirubin, UA: NEGATIVE
Glucose, UA: NEGATIVE
Ketones, UA: NEGATIVE
Leukocytes,UA: NEGATIVE
Nitrite, UA: NEGATIVE
Protein,UA: NEGATIVE
RBC, UA: NEGATIVE
Specific Gravity, UA: 1.01 (ref 1.005–1.030)
Urobilinogen, Ur: 0.2 mg/dL (ref 0.2–1.0)
pH, UA: 5 (ref 5.0–7.5)

## 2020-11-13 NOTE — Progress Notes (Signed)
Subjective:    Patient ID: Wanda Mckenzie, female    DOB: 05/13/1936, 85 y.o.   MRN: [id number]   HPI: Wanda Mckenzie is a 85 y.o. female presenting for more confusion than usual. She has dementia. It seems worse the last two days. YEsterday she was not following simple commands. Somewhat combative. Onset yesterday. Urinating less. Odor in urine a few days ago.  Bit on arm by a deer tick 3 days ago. Removed in just a few hours.    History by care giver Cynithia Hakimi, niece.   Depression screen [facility] 2/9 10/18/2020 06/05/2020 03/09/2020 12/02/2019 04/14/2019  Decreased Interest 0 0 0 0 1  Down, Depressed, Hopeless 0 0 0 0 0  PHQ - 2 Score 0 0 0 0 1  Altered sleeping - - - - 1  Tired, decreased energy - - - - 1  Change in appetite - - - - 0  Feeling bad or failure about yourself  - - - - 0  Trouble concentrating - - - - 1  Moving slowly or fidgety/restless - - - - 1  Suicidal thoughts - - - - 0  PHQ-9 Score - - - - 5  Difficult doing work/chores - - - - Not difficult at all     Relevant past medical, surgical, family and social history reviewed and updated as indicated.  Interim medical history since our last visit reviewed. Allergies and medications reviewed and updated.  ROS:  Review of Systems  Unable to perform ROS: Dementia     Social History   Tobacco Use  Smoking Status Never Smoker  Smokeless Tobacco Never Used       Objective:     Wt Readings from Last 3 Encounters:  10/06/20 158 lb 1.1 oz (71.7 kg)  07/14/20 162 lb 11.2 oz (73.8 kg)  07/10/20 165 lb 5.5 oz (75 kg)     Exam deferred. Pt. Harboring due to COVID 19. Phone visit performed.   Urine specimen brought to Office: Results for orders placed or performed in visit on 11/13/20  Urine Culture   Specimen: Urine   UR  Result Value Ref Range   Urine Culture, Routine Final report    Organism ID, Bacteria Comment   Urinalysis  Result Value Ref Range   Specific Gravity, UA 1.010 1.005 - 1.030    pH, UA 5.0 5.0 - 7.5   Color, UA Yellow Yellow   Appearance Ur Clear Clear   Leukocytes,UA Negative Negative   Protein,UA Negative Negative/Trace   Glucose, UA Negative Negative   Ketones, UA Negative Negative   RBC, UA Negative Negative   Bilirubin, UA Negative Negative   Urobilinogen, Ur 0.2 0.2 - 1.0 mg/dL   Nitrite, UA Negative Negative     Assessment & Plan:   1. Dysuria   2. Confusion   3. Dementia with behavioral disturbance, unspecified dementia type (HCC)     No orders of the defined types were placed in this encounter.   Orders Placed This Encounter  Procedures  . Urine Culture  . Urinalysis      Diagnoses and all orders for this visit:  Dysuria -     Urinalysis -     Urine Culture  Confusion -     Urinalysis -     Urine Culture  Dementia with behavioral disturbance, unspecified dementia type (HCC) -     Urinalysis -     Urine Culture    Virtual Visit via telephone  Note  I discussed the limitations, risks, security and privacy concerns of performing an evaluation and management service by telephone and the availability of in person appointments. The patient was identified with two identifiers. Pt.expressed understanding and agreed to proceed. Pt. Is at home. Dr. Darlyn Read is in his office.  Follow Up Instructions:   I discussed the assessment and treatment plan with the patient. The patient was provided an opportunity to ask questions and all were answered. The patient agreed with the plan and demonstrated an understanding of the instructions.   The patient was advised to call back or seek an in-person evaluation if the symptoms worsen or if the condition fails to improve as anticipated.   Total minutes including chart review and phone contact time: 12   Follow up plan: Return if symptoms worsen or fail to improve.  Mechele Claude, MD [facility]

## 2020-11-15 LAB — URINE CULTURE

## 2020-11-19 ENCOUNTER — Encounter: Payer: Self-pay | Admitting: Family Medicine

## 2020-11-26 ENCOUNTER — Other Ambulatory Visit: Payer: Self-pay | Admitting: Family Medicine

## 2020-11-26 DIAGNOSIS — I509 Heart failure, unspecified: Secondary | ICD-10-CM

## 2020-11-26 DIAGNOSIS — Z8673 Personal history of transient ischemic attack (TIA), and cerebral infarction without residual deficits: Secondary | ICD-10-CM

## 2020-11-26 DIAGNOSIS — I82402 Acute embolism and thrombosis of unspecified deep veins of left lower extremity: Secondary | ICD-10-CM

## 2020-11-27 ENCOUNTER — Telehealth: Payer: Self-pay | Admitting: Family Medicine

## 2020-11-27 DIAGNOSIS — Z8673 Personal history of transient ischemic attack (TIA), and cerebral infarction without residual deficits: Secondary | ICD-10-CM

## 2020-11-27 DIAGNOSIS — I82402 Acute embolism and thrombosis of unspecified deep veins of left lower extremity: Secondary | ICD-10-CM

## 2020-11-27 NOTE — Telephone Encounter (Signed)
  Prescription Request  11/27/2020  What is the name of the medication or equipment? Blood thinner meds?  Have you contacted your pharmacy to request a refill? (if applicable) yes  Which pharmacy would you like this sent to? [facility]   Patient notified that their request is being sent to the clinical staff for review and that they should receive a response within 2 business days.   Joyce's pt

## 2020-11-27 NOTE — Telephone Encounter (Signed)
Pt refill was sent to pharmacy this morning

## 2020-12-03 ENCOUNTER — Other Ambulatory Visit: Payer: Self-pay | Admitting: Family Medicine

## 2020-12-21 ENCOUNTER — Encounter: Payer: Self-pay | Admitting: Nurse Practitioner

## 2020-12-21 ENCOUNTER — Ambulatory Visit (INDEPENDENT_AMBULATORY_CARE_PROVIDER_SITE_OTHER): Payer: Medicare Other | Admitting: Nurse Practitioner

## 2020-12-21 DIAGNOSIS — R300 Dysuria: Secondary | ICD-10-CM

## 2020-12-21 MED ORDER — CEPHALEXIN 500 MG PO CAPS: 500.0000 mg | ORAL_CAPSULE | Freq: Two times a day (BID) | ORAL | 0 refills | Status: DC

## 2020-12-21 NOTE — Progress Notes (Signed)
   Virtual Visit  Note Due to COVID-19 pandemic this visit was conducted virtually. This visit type was conducted due to national recommendations for restrictions regarding the COVID-19 Pandemic (e.g. social distancing, sheltering in place) in an effort to limit this patient's exposure and mitigate transmission in our community. All issues noted in this document were discussed and addressed.  A physical exam was not performed with this format.  I connected with Wanda Mckenzie on 12/21/20 at 9:42 by telephone and verified that I am speaking with the correct person using two identifiers. Wanda Mckenzie is currently located at home and her niece Wanda Mckenzie ) is currently with her during visit. The provider, Mary-Margaret Daphine Deutscher, FNP is located in their office at time of visit.  I discussed the limitations, risks, security and privacy concerns of performing an evaluation and management service by telephone and the availability of in person appointments. I also discussed with the patient that there may be a patient responsible charge related to this service. The patient expressed understanding and agreed to proceed.   History and Present Illness:   Chief Complaint: Urinary Tract Infection   HPI Niece states that patient has been kinda cranky the last several days. Then yesterday she started c/o dysuria. This morning she was hollering when she voided that itr was hurting. Patient has dementia and is not able to talk on the phone.    Review of Systems  Constitutional:  Negative for chills and fever.  HENT: Negative.    Respiratory: Negative.    Cardiovascular: Negative.   Genitourinary:  Positive for dysuria, frequency and urgency. Negative for flank pain and hematuria.    Observations/Objective: Not able to speak with patient due to dementia  Assessment and Plan: Wanda Mckenzie in today with chief complaint of Urinary Tract Infection   1. Dysuria Increase fluids AZO OTC Rto if  does not resolve - cephALEXin (KEFLEX) 500 MG capsule; Take 1 capsule (500 mg total) by mouth 2 (two) times daily.  Dispense: 14 capsule; Refill: 0     Follow Up Instructions: prn    I discussed the assessment and treatment plan with the patient. The patient was provided an opportunity to ask questions and all were answered. The patient agreed with the plan and demonstrated an understanding of the instructions.   The patient was advised to call back or seek an in-person evaluation if the symptoms worsen or if the condition fails to improve as anticipated.  The above assessment and management plan was discussed with the patient. The patient verbalized understanding of and has agreed to the management plan. Patient is aware to call the clinic if symptoms persist or worsen. Patient is aware when to return to the clinic for a follow-up visit. Patient educated on when it is appropriate to go to the emergency department.   Time call ended:  9:55  I provided 13 minutes of  non face-to-face time during this encounter.    Mary-Margaret Daphine Deutscher, FNP

## 2021-01-16 ENCOUNTER — Other Ambulatory Visit: Payer: Self-pay | Admitting: Family Medicine

## 2021-01-16 DIAGNOSIS — Z8673 Personal history of transient ischemic attack (TIA), and cerebral infarction without residual deficits: Secondary | ICD-10-CM

## 2021-01-16 DIAGNOSIS — E782 Mixed hyperlipidemia: Secondary | ICD-10-CM

## 2021-01-28 ENCOUNTER — Other Ambulatory Visit: Payer: Self-pay | Admitting: Family Medicine

## 2021-01-28 DIAGNOSIS — R7989 Other specified abnormal findings of blood chemistry: Secondary | ICD-10-CM

## 2021-02-11 ENCOUNTER — Other Ambulatory Visit: Payer: Self-pay | Admitting: Family Medicine

## 2021-02-11 DIAGNOSIS — I509 Heart failure, unspecified: Secondary | ICD-10-CM

## 2021-02-27 ENCOUNTER — Ambulatory Visit: Payer: Medicare Other | Admitting: Family Medicine

## 2021-03-04 ENCOUNTER — Telehealth: Payer: Self-pay | Admitting: Family Medicine

## 2021-03-04 ENCOUNTER — Other Ambulatory Visit: Payer: Self-pay | Admitting: Family Medicine

## 2021-03-04 NOTE — Telephone Encounter (Signed)
Pt last seen by [facility] in April. Pt needed a follow up in August. She did not follow up and an appt has yet to be scheduled.  Called and spoke with pts husband. He states that we need to call between 8-1 to speak to niece that helps with pts care.   Will call back 9/13

## 2021-03-04 NOTE — Telephone Encounter (Signed)
  Prescription Request  03/04/2021  Is this a "Controlled Substance" medicine? no Have you seen your PCP in the last 2 weeks? no If YES, route message to pool  -  If NO, patient needs to be scheduled for appointment.  What is the name of the medication or equipment? Amiodarone  Have you contacted your pharmacy to request a refill? yes  Which pharmacy would you like this sent to? [facility]   Patient notified that their request is being sent to the clinical staff for review and that they should receive a response within 2 business days.    Britney Joyce's pt.  Pt has dementia.  Please call

## 2021-03-05 MED ORDER — AMIODARONE HCL 200 MG PO TABS: 200.0000 mg | ORAL_TABLET | Freq: Every day | ORAL | 0 refills | Status: DC

## 2021-03-05 NOTE — Telephone Encounter (Signed)
Patient caretaker aware and will call to schedule a follow up with Britney. Rx sent to pharmacy for patient.

## 2021-03-15 ENCOUNTER — Other Ambulatory Visit: Payer: Self-pay | Admitting: Family Medicine

## 2021-03-15 DIAGNOSIS — I10 Essential (primary) hypertension: Secondary | ICD-10-CM

## 2021-03-15 DIAGNOSIS — I509 Heart failure, unspecified: Secondary | ICD-10-CM

## 2021-03-25 ENCOUNTER — Other Ambulatory Visit: Payer: Self-pay | Admitting: Family Medicine

## 2021-03-25 DIAGNOSIS — R7989 Other specified abnormal findings of blood chemistry: Secondary | ICD-10-CM

## 2021-04-01 ENCOUNTER — Other Ambulatory Visit: Payer: Self-pay | Admitting: Family Medicine

## 2021-04-01 DIAGNOSIS — I82402 Acute embolism and thrombosis of unspecified deep veins of left lower extremity: Secondary | ICD-10-CM

## 2021-04-01 DIAGNOSIS — Z8673 Personal history of transient ischemic attack (TIA), and cerebral infarction without residual deficits: Secondary | ICD-10-CM

## 2021-04-05 ENCOUNTER — Ambulatory Visit (INDEPENDENT_AMBULATORY_CARE_PROVIDER_SITE_OTHER): Payer: Medicare Other | Admitting: Family Medicine

## 2021-04-05 ENCOUNTER — Encounter: Payer: Self-pay | Admitting: Family Medicine

## 2021-04-05 ENCOUNTER — Other Ambulatory Visit: Payer: Self-pay

## 2021-04-05 VITALS — BP 104/66 | HR 67 | Temp 97.0°F

## 2021-04-05 DIAGNOSIS — E782 Mixed hyperlipidemia: Secondary | ICD-10-CM

## 2021-04-05 DIAGNOSIS — I10 Essential (primary) hypertension: Secondary | ICD-10-CM | POA: Diagnosis not present

## 2021-04-05 DIAGNOSIS — I5022 Chronic systolic (congestive) heart failure: Secondary | ICD-10-CM

## 2021-04-05 DIAGNOSIS — J449 Chronic obstructive pulmonary disease, unspecified: Secondary | ICD-10-CM

## 2021-04-05 DIAGNOSIS — R7989 Other specified abnormal findings of blood chemistry: Secondary | ICD-10-CM

## 2021-04-05 DIAGNOSIS — Z86718 Personal history of other venous thrombosis and embolism: Secondary | ICD-10-CM | POA: Insufficient documentation

## 2021-04-05 DIAGNOSIS — I482 Chronic atrial fibrillation, unspecified: Secondary | ICD-10-CM

## 2021-04-05 DIAGNOSIS — N1832 Chronic kidney disease, stage 3b: Secondary | ICD-10-CM

## 2021-04-05 DIAGNOSIS — Z8673 Personal history of transient ischemic attack (TIA), and cerebral infarction without residual deficits: Secondary | ICD-10-CM

## 2021-04-05 DIAGNOSIS — F0150 Vascular dementia without behavioral disturbance: Secondary | ICD-10-CM

## 2021-04-05 MED ORDER — AMLODIPINE BESYLATE 5 MG PO TABS: 5.0000 mg | ORAL_TABLET | Freq: Every day | ORAL | 1 refills | Status: AC

## 2021-04-05 MED ORDER — ATORVASTATIN CALCIUM 20 MG PO TABS: ORAL_TABLET | ORAL | 1 refills | Status: AC

## 2021-04-05 MED ORDER — POTASSIUM CHLORIDE CRYS ER 20 MEQ PO TBCR: 20.0000 meq | EXTENDED_RELEASE_TABLET | Freq: Every day | ORAL | 1 refills | Status: AC

## 2021-04-05 MED ORDER — APIXABAN 5 MG PO TABS: 5.0000 mg | ORAL_TABLET | Freq: Two times a day (BID) | ORAL | 1 refills | Status: AC

## 2021-04-05 MED ORDER — FUROSEMIDE 20 MG PO TABS: ORAL_TABLET | ORAL | 1 refills | Status: AC

## 2021-04-05 MED ORDER — MEMANTINE HCL ER 28 MG PO CP24: ORAL_CAPSULE | ORAL | 1 refills | Status: AC

## 2021-04-05 NOTE — Progress Notes (Signed)
Assessment & Plan:  1. Essential hypertension Well controlled on current regimen.  - amLODipine (NORVASC) 5 MG tablet; Take 1 tablet (5 mg total) by mouth daily.  Dispense: 90 tablet; Refill: 1 - potassium chloride SA (KLOR-CON) 20 MEQ tablet; Take 1 tablet (20 mEq total) by mouth daily.  Dispense: 90 tablet; Refill: 1 - CBC with Differential/Platelet - CMP14+EGFR - Lipid panel  2. Chronic atrial fibrillation (HCC) Well controlled on current regimen.  - apixaban (ELIQUIS) 5 MG TABS tablet; Take 1 tablet (5 mg total) by mouth 2 (two) times daily.  Dispense: 180 tablet; Refill: 1 - CBC with Differential/Platelet  3. History of DVT (deep vein thrombosis) Well controlled on current regimen.  - apixaban (ELIQUIS) 5 MG TABS tablet; Take 1 tablet (5 mg total) by mouth 2 (two) times daily.  Dispense: 180 tablet; Refill: 1 - CBC with Differential/Platelet  4. Chronic systolic CHF (congestive heart failure) (HCC) Well controlled on current regimen.  - amLODipine (NORVASC) 5 MG tablet; Take 1 tablet (5 mg total) by mouth daily.  Dispense: 90 tablet; Refill: 1 - furosemide (LASIX) 20 MG tablet; 1 tablet daily  Dispense: 90 tablet; Refill: 1 - potassium chloride SA (KLOR-CON) 20 MEQ tablet; Take 1 tablet (20 mEq total) by mouth daily.  Dispense: 90 tablet; Refill: 1 - CBC with Differential/Platelet - CMP14+EGFR - Lipid panel  5. Mixed hyperlipidemia Well controlled on current regimen.  - atorvastatin (LIPITOR) 20 MG tablet; TAKE (1) TABLET DAILY AT BEDTIME.  Dispense: 90 tablet; Refill: 1 - CMP14+EGFR - Lipid panel  6. History of stroke Continue statin and Eliquis. - apixaban (ELIQUIS) 5 MG TABS tablet; Take 1 tablet (5 mg total) by mouth 2 (two) times daily.  Dispense: 180 tablet; Refill: 1 - atorvastatin (LIPITOR) 20 MG tablet; TAKE (1) TABLET DAILY AT BEDTIME.  Dispense: 90 tablet; Refill: 1  7. Elevated TSH Labs to assess for improvement after dose change. - T4, free - TSH  8.  COPD without exacerbation ([place]) Well controlled on current regimen.   9. Vascular dementia without behavioral disturbance ([place]) Patient has caregivers at home. - memantine (NAMENDA XR) 28 MG CP24 24 hr capsule; TAKE (1) CAPSULE DAILY.  Dispense: 90 capsule; Refill: 1 - CMP14+EGFR  10. Stage 3b chronic kidney disease ([place]) Managed by nephrology. - CMP14+EGFR   Return in about 6 months (around 10/04/2021) for annual physical.  Hendricks Limes, MSN, APRN, FNP-[facility]  Subjective:    Patient ID: Wanda Mckenzie, female    DOB: Jul 28, 1935, 85 y.o.   MRN: [id number]  Patient Care Team: Loman Brooklyn, FNP as PCP - General (Family Medicine) Satira Sark, MD as PCP - Cardiology (Cardiology)   Chief Complaint:  Chief Complaint  Patient presents with   Hypertension   COPD    Check up of chronic medical conditions     HPI: Wanda Mckenzie is a 85 y.o. female presenting on 04/05/2021 for Hypertension and COPD (Check up of chronic medical conditions )  Patient is accompanied by her niece today.   Patient gets serious illness care (palliative care). Niece reports they come out every 28 days to check on her and "prevent hospitalizations".   Hypertension: niece monitors BP 3x/week and reports good readings.  A-Fib: taking amiodarone and Eliquis. No reports of bleeding.  CHF: serious illness care requests weights every other day; niece states she cannot get her on a scale, that it takes another family member. They try to get her  weight as often as they can. No recent changes. Denies shortness of breath and edema.   Hyperlipidemia: taking atorvastatin.   History of stroke: taking Eliquis and atorvastatin.  Elevated TSH: levothyroxine was increased from 12.5 mcg to 25 mcg after labs resulted at her last visit. She was to return in 6-8 weeks for re-check of thyroid labs, but did not.   COPD: Albuterol for rescue. No maintenance inhaler.   Dementia:  continue Namenda.  CKD: managed by nephrology.  New complaints: None   Social history:  Relevant past medical, surgical, family and social history reviewed and updated as indicated. Interim medical history since our last visit reviewed.  Allergies and medications reviewed and updated.  DATA REVIEWED: CHART IN EPIC  ROS: Negative unless specifically indicated above in HPI.    Current Outpatient Medications:    acetaminophen (TYLENOL) 500 MG tablet, Take 1,000 mg by mouth every 6 (six) hours as needed for headache (pain)., Disp: , Rfl:    albuterol (VENTOLIN HFA) 108 (90 Base) MCG/ACT inhaler, Inhale 2 puffs into the lungs every 6 (six) hours as needed for wheezing or shortness of breath., Disp: 18 g, Rfl: 2   amiodarone (PACERONE) 200 MG tablet, Take 1 tablet (200 mg total) by mouth daily., Disp: 90 tablet, Rfl: 0   amLODipine (NORVASC) 5 MG tablet, Take 1 tablet (5 mg total) by mouth daily. (NEEDS TO BE SEEN BEFORE NEXT REFILL), Disp: 30 tablet, Rfl: 0   atorvastatin (LIPITOR) 20 MG tablet, TAKE (1) TABLET DAILY AT BEDTIME., Disp: 30 tablet, Rfl: 1   diclofenac Sodium (VOLTAREN) 1 % GEL, Apply 2 g topically 4 (four) times daily., Disp: 50 g, Rfl: 2   ELIQUIS 5 MG TABS tablet, TAKE ONE TABLET TWICE A DAY., Disp: 60 tablet, Rfl: 0   furosemide (LASIX) 20 MG tablet, TAKE 1 TAB. 3 TIMES A WEEK; MONDAY, WEDNESDAY & FRIDAY., Disp: 30 tablet, Rfl: 0   levothyroxine (SYNTHROID) 25 MCG tablet, TAKE 1 TABLET ONCE DAILY BEFORE BREAKFAST., Disp: 30 tablet, Rfl: 0   lisinopril (ZESTRIL) 5 MG tablet, Take 1 tablet (5 mg total) by mouth daily., Disp: 90 tablet, Rfl: 1   memantine (NAMENDA XR) 28 MG CP24 24 hr capsule, TAKE (1) CAPSULE DAILY., Disp: 30 capsule, Rfl: 0   potassium chloride SA (KLOR-CON) 20 MEQ tablet, Take 1 tablet (20 mEq total) by mouth daily. (NEEDS TO BE SEEN BEFORE NEXT REFILL), Disp: 30 tablet, Rfl: 0   pyridostigmine (MESTINON) 60 MG tablet, Take 1 tablet (60 mg total) by mouth  2 (two) times daily., Disp: 180 tablet, Rfl: 1   Spacer/Aero-Holding Chambers (AEROCHAMBER PLUS) inhaler, Use as instructed, Disp: 1 each, Rfl: 2   hydrALAZINE (APRESOLINE) 100 MG tablet, TAKE (1) TABLET EVERY EIGHT HOURS., Disp: 90 tablet, Rfl: 2   Allergies  Allergen Reactions   Codeine Nausea And Vomiting   Past Medical History:  Diagnosis Date   Atrial fibrillation (HCC)    COPD (chronic obstructive pulmonary disease) (HCC)    Dyslipidemia    Essential hypertension    History of cardiac catheterization 11/2013   Normal coronaries   History of stroke    Right MCA distribution     Past Surgical History:  Procedure Laterality Date   ABDOMINAL HYSTERECTOMY  1975   FRACTURE SURGERY Left    LEFT HEART CATHETERIZATION WITH CORONARY ANGIOGRAM N/A 11/23/2013   Procedure: LEFT HEART CATHETERIZATION WITH CORONARY ANGIOGRAM;  Surgeon: Burnell Blanks, MD;  Location: [facility] CATH LAB;  Service: Cardiovascular;  Laterality: N/A;   LEFT HIP HEMIARTHROPLASTY     TRIBUTARY VARICOSITIES OF RIGHT LEG  05/23/2002   VEIN LIGATION AND STRIPPING Right 02/08/2013   Procedure: Segmental excision of painful varicose veins right lower extremity;  Surgeon: Angelia Mould, MD;  Location[facility] OR;  Service: Vascular;  Laterality: Right;    Social History   Socioeconomic History   Marital status: Married    Spouse name: Not on file   Number of children: Not on file   Years of education: Not on file   Highest education level: Not on file  Occupational History   Not on file  Tobacco Use   Smoking status: Never   Smokeless tobacco: Never  Vaping Use   Vaping Use: Never used  Substance and Sexual Activity   Alcohol use: No    Alcohol/week: 0.0 standard drinks   Drug use: No   Sexual activity: Not Currently  Other Topics Concern   Not on file  Social History Narrative   Not on file   Social Determinants of Health   Financial Resource Strain: Not on file  Food Insecurity: Not on file   Transportation Needs: Not on file  Physical Activity: Not on file  Stress: Not on file  Social Connections: Not on file  Intimate Partner Violence: Not on file        Objective:    BP 104/66   Pulse 67   Temp (!) 97 F (36.1 C) (Temporal)   SpO2 94%   Wt Readings from Last 3 Encounters:  10/06/20 158 lb 1.1 oz (71.7 kg)  07/14/20 162 lb 11.2 oz (73.8 kg)  07/10/20 165 lb 5.5 oz (75 kg)    Physical Exam Vitals reviewed.  Constitutional:      General: She is not in acute distress.    Appearance: Normal appearance. She is not ill-appearing, toxic-appearing or diaphoretic.  HENT:     Head: Normocephalic and atraumatic.  Eyes:     General: No scleral icterus.       Right eye: No discharge.        Left eye: No discharge.     Conjunctiva/sclera: Conjunctivae normal.  Cardiovascular:     Rate and Rhythm: Normal rate. Rhythm irregular.     Heart sounds: Normal heart sounds. No murmur heard.   No friction rub. No gallop.  Pulmonary:     Effort: Pulmonary effort is normal. No respiratory distress.     Breath sounds: Normal breath sounds. No stridor. No wheezing, rhonchi or rales.  Musculoskeletal:        General: Normal range of motion.     Cervical back: Normal range of motion.  Skin:    General: Skin is warm and dry.     Capillary Refill: Capillary refill takes less than 2 seconds.  Neurological:     General: No focal deficit present.     Mental Status: She is alert and oriented to person, place, and time. Mental status is at baseline.     Gait: Gait abnormal (riding in [facility]).  Psychiatric:        Behavior: Behavior normal.        Thought Content: Thought content normal.        Cognition and Memory: Cognition is impaired. Memory is impaired.    Lab Results  Component Value Date   TSH 9.460 (H) 10/18/2020   Lab Results  Component Value Date   WBC 6.4 10/18/2020   HGB 13.6 10/18/2020   HCT 41.1  10/18/2020   MCV 97 10/18/2020   PLT 363 10/18/2020   Lab Results   Component Value Date   NA 144 10/18/2020   K 4.2 10/18/2020   CO2 22 10/18/2020   GLUCOSE 97 10/18/2020   BUN 13 10/18/2020   CREATININE 0.91 10/18/2020   BILITOT 0.5 10/18/2020   ALKPHOS 94 10/18/2020   AST 21 10/18/2020   ALT 10 10/18/2020   PROT 6.2 10/18/2020   ALBUMIN 3.9 10/18/2020   CALCIUM 8.6 (L) 10/18/2020   ANIONGAP 7 10/03/2020   EGFR 63 10/18/2020   Lab Results  Component Value Date   CHOL 117 06/05/2020   Lab Results  Component Value Date   HDL 36 (L) 06/05/2020   Lab Results  Component Value Date   LDLCALC 57 06/05/2020   Lab Results  Component Value Date   TRIG 137 06/05/2020   Lab Results  Component Value Date   CHOLHDL 3.3 06/05/2020   Lab Results  Component Value Date   HGBA1C 5.5 07/22/2019

## 2021-04-06 LAB — CMP14+EGFR
ALT: 10 IU/L (ref 0–32)
AST: 20 IU/L (ref 0–40)
Albumin/Globulin Ratio: 1.6 (ref 1.2–2.2)
Albumin: 3.6 g/dL (ref 3.6–4.6)
Alkaline Phosphatase: 78 IU/L (ref 44–121)
BUN/Creatinine Ratio: 16 (ref 12–28)
BUN: 16 mg/dL (ref 8–27)
Bilirubin Total: 0.4 mg/dL (ref 0.0–1.2)
CO2: 21 mmol/L (ref 20–29)
Calcium: 8.2 mg/dL — ABNORMAL LOW (ref 8.7–10.3)
Chloride: 113 mmol/L — ABNORMAL HIGH (ref 96–106)
Creatinine, Ser: 1.02 mg/dL — ABNORMAL HIGH (ref 0.57–1.00)
Globulin, Total: 2.2 g/dL (ref 1.5–4.5)
Glucose: 130 mg/dL — ABNORMAL HIGH (ref 70–99)
Potassium: 3.5 mmol/L (ref 3.5–5.2)
Sodium: 150 mmol/L — ABNORMAL HIGH (ref 134–144)
Total Protein: 5.8 g/dL — ABNORMAL LOW (ref 6.0–8.5)
eGFR: 55 mL/min/{1.73_m2} — ABNORMAL LOW (ref >59)

## 2021-04-06 LAB — CBC WITH DIFFERENTIAL/PLATELET
Basophils Absolute: 0.1 10*3/uL (ref 0.0–0.2)
Basos: 1 %
EOS (ABSOLUTE): 0.1 10*3/uL (ref 0.0–0.4)
Eos: 2 %
Hematocrit: 36.3 % (ref 34.0–46.6)
Hemoglobin: 12.3 g/dL (ref 11.1–15.9)
Immature Grans (Abs): 0 10*3/uL (ref 0.0–0.1)
Immature Granulocytes: 0 %
Lymphocytes Absolute: 1.7 10*3/uL (ref 0.7–3.1)
Lymphs: 28 %
MCH: 32.5 pg (ref 26.6–33.0)
MCHC: 33.9 g/dL (ref 31.5–35.7)
MCV: 96 fL (ref 79–97)
Monocytes Absolute: 0.4 10*3/uL (ref 0.1–0.9)
Monocytes: 7 %
Neutrophils Absolute: 3.7 10*3/uL (ref 1.4–7.0)
Neutrophils: 62 %
Platelets: 267 10*3/uL (ref 150–450)
RBC: 3.78 x10E6/uL (ref 3.77–5.28)
RDW: 13 % (ref 11.7–15.4)
WBC: 6 10*3/uL (ref 3.4–10.8)

## 2021-04-06 LAB — LIPID PANEL
Chol/HDL Ratio: 2.7 ratio (ref 0.0–4.4)
Cholesterol, Total: 123 mg/dL (ref 100–199)
HDL: 46 mg/dL (ref >39)
LDL Chol Calc (NIH): 62 mg/dL (ref 0–99)
Triglycerides: 74 mg/dL (ref 0–149)
VLDL Cholesterol Cal: 15 mg/dL (ref 5–40)

## 2021-04-06 LAB — T4, FREE: Free T4: 1.78 ng/dL — ABNORMAL HIGH (ref 0.82–1.77)

## 2021-04-06 LAB — TSH: TSH: 4.9 u[IU]/mL — ABNORMAL HIGH (ref 0.450–4.500)

## 2021-04-22 ENCOUNTER — Other Ambulatory Visit: Payer: Self-pay | Admitting: Family Medicine

## 2021-04-22 DIAGNOSIS — I509 Heart failure, unspecified: Secondary | ICD-10-CM

## 2021-05-27 ENCOUNTER — Other Ambulatory Visit: Payer: Self-pay | Admitting: Family Medicine

## 2021-05-27 DIAGNOSIS — R7989 Other specified abnormal findings of blood chemistry: Secondary | ICD-10-CM

## 2021-06-03 ENCOUNTER — Other Ambulatory Visit: Payer: Self-pay | Admitting: Family Medicine

## 2021-07-24 DEATH — deceased

## 2021-10-04 ENCOUNTER — Ambulatory Visit: Payer: Medicare Other | Admitting: Family Medicine
